# Patient Record
Sex: Female | Born: 1945
Health system: Southern US, Community
[De-identification: ages and names within clinical notes are randomized; demographics above are authoritative.]

## PROBLEM LIST (undated history)

## (undated) DIAGNOSIS — F419 Anxiety disorder, unspecified: Secondary | ICD-10-CM

## (undated) DIAGNOSIS — J45909 Unspecified asthma, uncomplicated: Secondary | ICD-10-CM

## (undated) DIAGNOSIS — M1990 Unspecified osteoarthritis, unspecified site: Secondary | ICD-10-CM

## (undated) DIAGNOSIS — J209 Acute bronchitis, unspecified: Secondary | ICD-10-CM

## (undated) HISTORY — DX: Unspecified osteoarthritis, unspecified site: M19.90

## (undated) HISTORY — PX: FACELIFT: SHX1566

## (undated) HISTORY — DX: Anxiety disorder, unspecified: F41.9

## (undated) HISTORY — DX: Acute bronchitis, unspecified: J20.9

## (undated) HISTORY — DX: Unspecified asthma, uncomplicated: J45.909

## (undated) HISTORY — PX: BREAST ENHANCEMENT SURGERY: SHX7

## (undated) HISTORY — PX: ROTATOR CUFF REPAIR: SHX139

## (undated) HISTORY — PX: CATARACT EXTRACTION W/ INTRAOCULAR LENS IMPLANT: SHX1309

---

## 2001-08-15 HISTORY — PX: TUMOR EXCISION: SHX421

## 2001-12-12 ENCOUNTER — Other Ambulatory Visit: Admission: RE | Admit: 2001-12-12 | Discharge: 2001-12-12 | Payer: Self-pay | Admitting: Family Medicine

## 2002-08-15 HISTORY — PX: TIBIA FRACTURE SURGERY: SHX806

## 2003-06-20 ENCOUNTER — Inpatient Hospital Stay (HOSPITAL_COMMUNITY): Admission: RE | Admit: 2003-06-20 | Discharge: 2003-06-24 | Payer: Self-pay | Admitting: Specialist

## 2007-08-29 ENCOUNTER — Ambulatory Visit: Payer: Self-pay | Admitting: Internal Medicine

## 2007-11-27 ENCOUNTER — Other Ambulatory Visit: Admission: RE | Admit: 2007-11-27 | Discharge: 2007-11-27 | Payer: Self-pay | Admitting: Internal Medicine

## 2008-06-26 ENCOUNTER — Ambulatory Visit: Payer: Self-pay | Admitting: Internal Medicine

## 2009-01-08 ENCOUNTER — Other Ambulatory Visit: Admission: RE | Admit: 2009-01-08 | Discharge: 2009-01-08 | Payer: Self-pay | Admitting: Internal Medicine

## 2009-01-08 ENCOUNTER — Ambulatory Visit: Payer: Self-pay | Admitting: Internal Medicine

## 2009-04-27 ENCOUNTER — Ambulatory Visit: Payer: Self-pay | Admitting: Internal Medicine

## 2010-03-09 ENCOUNTER — Ambulatory Visit: Payer: Self-pay | Admitting: Internal Medicine

## 2010-08-19 ENCOUNTER — Ambulatory Visit
Admission: RE | Admit: 2010-08-19 | Discharge: 2010-08-19 | Payer: Self-pay | Source: Home / Self Care | Attending: Internal Medicine | Admitting: Internal Medicine

## 2010-12-28 NOTE — Assessment & Plan Note (Signed)
[facility]                         GASTROENTEROLOGY OFFICE NOTE   Mary Garcia, Mary Garcia                           MRN:          [id number]  DATE:08/29/2007                            DOB:          February 09, 1946    HISTORY:  Mary Garcia is a 65 year old white female whom we saw for a  screening colonoscopy in 2005, with findings of a mild diverticulosis of  the left colon.  Starting in August 2008, she started having abdominal  pain, which came in waves.  She had several severe attacks of epigastric  pain localized, extending to the left upper quadrant.  It happened at a  time when she started a weight loss program through [employer] and  has lost about 15 pounds.  She was at that time on a low-fat diet.  She  discontinued her Boniva for six months at that time, but she is back on  it.  She denied taking any anti-inflammatory agents or excessive  aspirin.  Since August she has off and on had problems with her stomach,  but essentially she is about 50% to 75% improved.  An upper abdominal  ultrasound done in November 2008, was normal.  A HIDA scan with CCK  showed a low ejection fraction, according to the patient 20%, but I do  not have the exact results available to me.  She also reports that she  had a reproduction of her abdominal pain with CK injection.  She  subsequently saw two separate surgeons for a second opinion.  Each of  them hesitated to proceed with a cholecystectomy because of lack of  objective evidence.  She has improved, except for last week she had  another attack while being in [place], after having an [place] meal which  was rather spicy.  The pain and diarrhea continued for several hours,  but she was better the next day.  There was no fever or jaundice.  She  was tried on Zegerid without any improvement of her symptoms.   MEDICATIONS:  1. Boniva once a month.  It was restarted in December.  2. Calcium supplements and vitamin D.  3.  Ambien p.r.n.   FAMILY HISTORY:  Negative for gallbladder disease.  Father had brain  cancer, deceased.  Mother had diabetes.   SOCIAL HISTORY:  Married.  She has a college degree.  She works as a  Armed forces operational officer.  She is married and has a 62 year old son.  She drinks  about four glasses of wine per week.  She used to smoke for 30 years,  but no longer.   PAST MEDICAL HISTORY:  1. Osteopenia.  2. She had breast augmentation in the 1990's.   REVIEW OF SYSTEMS:  Positive for eye glasses, sleeping problems.   PHYSICAL EXAMINATION:  VITAL SIGNS:  Blood pressure 104/70, pulse 64,  weight 125 pounds.  GENERAL:  She was alert and oriented, in no distress.  HEENT:  Sclerae anicteric.  NECK:  Supple with no lymphadenopathy.  LUNGS:  Clear to auscultation.  COR:  Normal S1 and S2.  ABDOMEN:  Soft,  nontender, relaxed.  Normoactive bowel sounds.  I could  not reproduce her pain or tenderness today.  Liver edge at the costal  margin.  There were no scars.  Left upper quadrant today was normal.  Left lower quadrant was also normal.  RECTAL:  Soft, Hemoccult-negative stool.   IMPRESSION:  A 65 year old white female with episode of left upper and  epigastric abdominal pain, which was reproduced on a HIDA scan with CCK  injection, although on ultrasound her gallbladder appeared normal,  without evidence of chronic cholecystitis.  Her symptoms are quite  suggestive of biliary colic, which seems to be recurring, especially  after certain meals with a high-fat content.  The other explanation  could be irritable bowel syndrome with splenic flexure syndrome or  gastrocolic reflux leading to diarrhea.  She was on Bentyl for awhile  and it seemed to help some of her symptoms.  She also tried Zegerid  without much improvement.  Another possibility would be peptic ulcer  disease, although she has no dyspepsia or heartburn and the PPI's did  not help.   PLAN:  1. At this point she is getting  better, so we are going to continue to      observe her.  I have encouraged her to stay on a strict low-fat      diet.  2. New prescription for dicyclomine, to take 20 mg on a p.r.n. basis,      especially before going out to eat.  3. Call us if the pain recurs.  I would like to get liver function      tests, amylase and lipase within 24 hours of the pain.  4. I would like to see her in the office to decide if we need to      repeat the HIDA scan.  5. Vicodin 10 mg, one p.o. q.4-6h. p.r.n. severe abdominal pain.  She      may use this if she gets an attack.     Hedwig Morton. Juanda Chance, MD  Electronically Signed    DMB/MedQ  DD: 08/29/2007  DT: 08/29/2007  Job #: [id number]   cc:   Duwayne Heck L. Mahaffey, M.D.

## 2010-12-31 NOTE — Discharge Summary (Signed)
NAME:  ADDISSON, FRATE                              ACCOUNT NO.:  [id number]   MEDICAL RECORD NO.:  [id number]                   PATIENT TYPE:  INP   LOCATION:  5024                                 FACILITY:  [facility]   PHYSICIAN:  Erasmo Leventhal, M.D.         DATE OF BIRTH:  Dec 18, 1945   DATE OF ADMISSION:  06/20/2003  DATE OF DISCHARGE:  06/24/2003                                 DISCHARGE SUMMARY   ADMISSION DIAGNOSIS:  Left tibia-fibula fracture.   DISCHARGE DIAGNOSIS:  Left tibia-fibula fracture.   OPERATION:  Intramedullary nail tibia with percutaneous posterior malleolar  screw and open reduction and internal fixation fibular fracture.   BRIEF HISTORY:  This is a 65 year old lady who had a fall while hiking on  the Saturday before admission, was seen at [facility], casted.  The fracture was a closed type fracture.  She was advised to contact her  orthopedist when she returned to town and contacted [place].  We saw her,  evaluated her, and felt that she needed IM nail and open reduction and  internal fixation of her tibial fracture and posterior malleolar fracture,  and she was subsequently admitted for same.   LABORATORY DATA:  Admission CBC within normal limits.  Admission BMET showed  glucose high at 100, otherwise  within normal limits.  She did have elevated  liver functions with AST 62, ALT 76.  Repeat CBC showed hemoglobin and  hematocrit dropped to a low of 10.9 and 31.0 at discharge.  She did run  elevated sugars throughout her admission with a high of 146.  Calcium was  low at 8.0 and sodium at 132 on November 7.  Her liver functions repeated on  November 7 showed the AST to drop to 50 and ALT to drop to 68.  Total  bilirubin was normal at 0.5 initially and then low at 0.2.   HOSPITAL COURSE:  The patient tolerated the operative procedure well.  Postoperatively, she had some burning pain in the side of her leg, otherwise  dressing remained dry.  She could  wiggle her toes.  Neurovascular status  remained intact.  Compartments were soft.   On the second postoperative day, she was feeling pretty good.  Vital signs  were stable.  She was afebrile.  No calf tenderness, no compartment signs.  PCA was to be discontinued in the a.m., and repeat LFTs were obtained and  showed them to be decreasing.  On the third postoperative day, she had a  temperature to 100.2.  Her knee dressing was changed.  Her wound was benign.  She had no calf tenderness.  Lungs were clear but slightly decreased breath  sounds in the right base, and incentive spirometry was switched to every 30  minutes while awake.  PCA was discontinued, and the patient was switched to  Percocet 1 p.o. q.4-6h. p.r.n. pain.   On the fourth postoperative day,  the patient continued to do well, did well  in PT, getting up, and on postoperative day #5, the patient was subsequently  discharged home for followup in the office.   CONDITION ON DISCHARGE:  Improved.   DISCHARGE MEDICATIONS:  Lovenox, Percocet, Robaxin, and Trinsicon.   DISCHARGE INSTRUCTIONS:  1. She is to be nonweightbearing on her leg.  2. She is to call her medical doctor for recheck of her liver function     studies in two weeks.  3. She is to do her exercises as instructed.  4. Return to the office in 10 days for reevaluation or sooner p.r.n.     problems.      [name], P.A.                   Erasmo Leventhal, M.D.    SJC/MEDQ  D:  08/07/2003  T:  08/08/2003  Job:  [id number]

## 2010-12-31 NOTE — Op Note (Signed)
NAME:  Mary, Garcia                              ACCOUNT NO.:  [id number]   MEDICAL RECORD NO.:  [id number]                   PATIENT TYPE:  INP   LOCATION:  2550                                 FACILITY:  [facility]   PHYSICIAN:  Erasmo Leventhal, M.D.         DATE OF BIRTH:  06/24/1946   DATE OF PROCEDURE:  06/20/2003  DATE OF DISCHARGE:                                 OPERATIVE REPORT   PREOPERATIVE DIAGNOSES:  Left tibial diaphyseal fracture, left ankle  bimalleolar fracture, posterior malleolus and comminuted displaced distal  fibula fracture, lateral malleolus.   POSTOPERATIVE DIAGNOSES:  Left tibial diaphyseal fracture, left ankle  bimalleolar fracture, posterior malleolus and comminuted displaced distal  fibula fracture, lateral malleolus.   PROCEDURE:  1. Closed reduction and percutaneous internal fixation of posterior     malleolus.  2. Closed reduction with intramedullary nailing of tibial diaphyseal     fracture, static interlock.  3. Open reduction and internal fixation of the distal fibula lateral     malleolus fracture.   SURGEON:  Erasmo Leventhal, M.D.   ASSESSMENT:  Mary Garcia, P.A.   ANESTHESIA:  General.   ESTIMATED BLOOD LOSS:  Less than 150 mL.   DRAINS:  None.   COMPLICATIONS:  None.   TOURNIQUET TIME:  1 hour and 20 minutes at 350 mmHg.   DESCRIPTION OF PROCEDURE:  The patient was counseled in the holding area,  correct side was identified. IV was started, antibiotics had been given.  Taken to the operating room, placed in the supine position, general  anesthesia, left lower extremity long leg cast was removed. The skin was  intact, swelling was mild to moderate, compartments were soft, good pulses  distally. She is elevated, prepped with Duraprep and all draped in sterile  fashion. Exsanguination of the Esmarch, tourniquet was inflated to 350 mmHg.   Attention was directed initially to the posterior malleolus fracture which  was  nondisplaced, but I was concerned with placement of the intramedullary  rod it would displace it. Utilizing the C-arm, a small percutaneous incision  was made anteromedially directly over the anterior tibial tendon. It was  retracted laterally down to the periosteum of the bone. Utilizing the Depuy  cannulated screw set, a 4.0 cannulated screw was placed from anterior to  posterior capturing the fragment just above the joint line and totally  stabilized the posterior malleolus. It remained anatomically reduced to the  joint, mildly displaced metaphyseal diaphyseal junction.   Attention directed proximally and a longitudinal incision made anterior to  the patella tendon through the skin and subcutaneous tissue, small veins  were lightly coagulated. A medial incision was made through the retinaculum  and capsule. At this point in time, we stay intraarticular but  extrasynovial. She has a very short amount of tibia between the tibial  articular surface and then the tibial tubercle and patellar tendon  insertion.  A guide pin was placed directly in the center, it was over-  reamed with a step reamer. Utilizing a ball tip guidewire, it was gently  passed from proximal to distal across the fracture site to reduce it  manually. She had a very tight canal. I then utilizing sequential reaming. I  then reamed. The tourniquet was deflated prior to reaming for cooling of the  bone. Beginning at 8, I went to 8, 8-1/2, and 9 mm. At 9-mm ream, we had  excellent cortical chatter and that was as much as we could do. I initially  chose a 28.5-cm screw, however, that was too short, I went to a 30 cm next.  The ball tip guidewire was exchanged for a straight tip and then with my  being very meticulous proximally making sure we did not injure the proximal  tibia. An 8 mm x 30 cm Ace tibial nail was placed from proximal to distal  across the fracture site aligning it as well as possibly regaining length  and  rotation. There was some offset of displacement, I felt it was  satisfactory. It was taken to the appropriate level and then proximally  utilizing standard technique, two percutaneous screws were placed to  __________to the jig. The jig was then removed and locking cap was placed  into the tibial nail. Distally I chose to interlock the more proximal  transverse interlocking screw due to the fact that the distal screw was near  the posterior malleolar fracture. I percutaneously made a small incision  medially and utilizing the C-arm and fluoroscopic technique, the transverse  screw was then placed and exchanged for a shorter one. At this point, we had  excellent fixation of the tibia, alignment and rotation length and also the  posterior malleolus.   The tibia and distal fibula was comminuted and I was concerned if I did not  fix it at this point in time, the ankle may drift into valgus. Based upon  that, the extremity was exsanguinated again with an Esmarch, longitudinal  incision was made and tourniquet was inflated. A longitudinal incision was  made laterally through the skin and subcutaneous tissue, small cutaneous  nerves retracted out of the way. The periosteum was opened, the distal  fibula was found to be severely comminuted utilizing the Ace titanium screw  set plate. A one-third tubular plate was applied posteriorly, fixed distally  and then proximally reducing the fracture as well as possible. An  interfragment screw was placed from anterior to posterior with a washer.   At this point in time, I felt like I had excellent reduction and internal  fixation of the tibial diaphyseal fracture, the posterior malleolus and the  distal fibula fracture.   All wounds were irrigated during the closure. The percutaneous incisions  were closed with staples proximally. The medial parapatellar incision was closed with Vicryl, subcu Vicryl, skin subcuticular Monocryl sutures. Ankle  was  closed, periosteum with Vicryl, subcu Vicryl. The skin was closed with  subcuticular Monocryl suture. Benzoin and Steri-Strips were applied. The  tourniquet was deflated. She had normal circulation of the foot and ankle at  the end of the case. Excellent clinical alignment and appearance of the  lower extremity. She was placed in a well padded plaster splint at this  point in time. She was given another gram of Ancef intravenously.   The patient was then awakened, taken from the operating room to PACU in  stable condition. Sponge and needle count were  correct. There were no  complications. Throughout the entire procedure, careful thought and  consideration was given to the neurovascular structure of the lower  extremity and these were protected. In addition, the compartments were  palpated and felt to be soft at the end of the case and there was no  evidence of an impending compartment syndrome. The patient was awakened and  taken from the operating room to the recovery room in stable condition.  Sponge and needle count were correct. The surgeon was Erasmo Leventhal,  M.D., assistant Mary Garcia, P.A.   To decrease surgical time and help throughout the entire difficult  procedure, Mr. Brett Canales Garcia's assistance was needed.                                               Erasmo Leventhal, M.D.   RAC/MEDQ  D:  06/20/2003  T:  06/21/2003  Job:  [id number]

## 2011-03-08 ENCOUNTER — Encounter: Payer: Self-pay | Admitting: Internal Medicine

## 2011-03-14 ENCOUNTER — Other Ambulatory Visit: Payer: Self-pay | Admitting: Internal Medicine

## 2011-03-14 DIAGNOSIS — Z0000 Encounter for general adult medical examination without abnormal findings: Secondary | ICD-10-CM

## 2011-03-14 LAB — CBC WITH DIFFERENTIAL/PLATELET
Basophils Absolute: 0 10*3/uL (ref 0.0–0.1)
Basophils Relative: 0 % (ref 0–1)
Eosinophils Absolute: 0.1 10*3/uL (ref 0.0–0.7)
Eosinophils Relative: 2 % (ref 0–5)
HCT: 41.9 % (ref 36.0–46.0)
Hemoglobin: 13.8 g/dL (ref 12.0–15.0)
Lymphocytes Relative: 28 % (ref 12–46)
Lymphs Abs: 1.4 10*3/uL (ref 0.7–4.0)
MCH: 28.8 pg (ref 26.0–34.0)
MCHC: 32.9 g/dL (ref 30.0–36.0)
MCV: 87.3 fL (ref 78.0–100.0)
Monocytes Absolute: 0.7 10*3/uL (ref 0.1–1.0)
Monocytes Relative: 15 % — ABNORMAL HIGH (ref 3–12)
Neutro Abs: 2.6 10*3/uL (ref 1.7–7.7)
Neutrophils Relative %: 54 % (ref 43–77)
Platelets: 295 10*3/uL (ref 150–400)
RBC: 4.8 MIL/uL (ref 3.87–5.11)
RDW: 14.9 % (ref 11.5–15.5)
WBC: 4.8 10*3/uL (ref 4.0–10.5)

## 2011-03-14 LAB — COMPREHENSIVE METABOLIC PANEL
ALT: 16 U/L (ref 0–35)
AST: 21 U/L (ref 0–37)
Albumin: 4.2 g/dL (ref 3.5–5.2)
Alkaline Phosphatase: 64 U/L (ref 39–117)
BUN: 19 mg/dL (ref 6–23)
CO2: 22 mEq/L (ref 19–32)
Calcium: 9.6 mg/dL (ref 8.4–10.5)
Chloride: 104 mEq/L (ref 96–112)
Creat: 0.73 mg/dL (ref 0.50–1.10)
Glucose, Bld: 91 mg/dL (ref 70–99)
Potassium: 4.5 mEq/L (ref 3.5–5.3)
Sodium: 139 mEq/L (ref 135–145)
Total Bilirubin: 0.2 mg/dL — ABNORMAL LOW (ref 0.3–1.2)
Total Protein: 6.8 g/dL (ref 6.0–8.3)

## 2011-03-14 LAB — LIPID PANEL
Cholesterol: 221 mg/dL — ABNORMAL HIGH (ref 0–200)
HDL: 95 mg/dL (ref >39)
LDL Cholesterol: 108 mg/dL — ABNORMAL HIGH (ref 0–99)
Total CHOL/HDL Ratio: 2.3 Ratio
Triglycerides: 91 mg/dL (ref <150)
VLDL: 18 mg/dL (ref 0–40)

## 2011-03-14 LAB — TSH: TSH: 2.995 u[IU]/mL (ref 0.350–4.500)

## 2011-03-15 LAB — VITAMIN D 25 HYDROXY (VIT D DEFICIENCY, FRACTURES): Vit D, 25-Hydroxy: 40 ng/mL (ref 30–89)

## 2011-03-18 ENCOUNTER — Ambulatory Visit (INDEPENDENT_AMBULATORY_CARE_PROVIDER_SITE_OTHER): Payer: Self-pay | Admitting: Internal Medicine

## 2011-03-18 ENCOUNTER — Encounter: Payer: Self-pay | Admitting: Internal Medicine

## 2011-03-18 VITALS — BP 116/76 | HR 76 | Temp 98.2°F | Ht 62.75 in | Wt 132.0 lb

## 2011-03-18 DIAGNOSIS — G4700 Insomnia, unspecified: Secondary | ICD-10-CM

## 2011-03-18 DIAGNOSIS — M1990 Unspecified osteoarthritis, unspecified site: Secondary | ICD-10-CM

## 2011-03-18 DIAGNOSIS — Z8669 Personal history of other diseases of the nervous system and sense organs: Secondary | ICD-10-CM

## 2011-03-18 DIAGNOSIS — F411 Generalized anxiety disorder: Secondary | ICD-10-CM

## 2011-03-18 DIAGNOSIS — K828 Other specified diseases of gallbladder: Secondary | ICD-10-CM

## 2011-03-18 DIAGNOSIS — F419 Anxiety disorder, unspecified: Secondary | ICD-10-CM

## 2011-03-18 DIAGNOSIS — M8580 Other specified disorders of bone density and structure, unspecified site: Secondary | ICD-10-CM

## 2011-03-18 DIAGNOSIS — E559 Vitamin D deficiency, unspecified: Secondary | ICD-10-CM

## 2011-03-18 DIAGNOSIS — M899 Disorder of bone, unspecified: Secondary | ICD-10-CM

## 2011-03-18 DIAGNOSIS — Z0000 Encounter for general adult medical examination without abnormal findings: Secondary | ICD-10-CM

## 2011-03-18 DIAGNOSIS — K589 Irritable bowel syndrome without diarrhea: Secondary | ICD-10-CM

## 2011-03-18 LAB — POCT URINALYSIS DIPSTICK
Bilirubin, UA: NEGATIVE
Blood, UA: NEGATIVE
Glucose, UA: NEGATIVE
Leukocytes, UA: NEGATIVE
Nitrite, UA: NEGATIVE
Protein, UA: NEGATIVE
Spec Grav, UA: 1.025
Urobilinogen, UA: NEGATIVE
pH, UA: 6

## 2011-04-13 ENCOUNTER — Encounter: Payer: Self-pay | Admitting: Internal Medicine

## 2011-04-13 DIAGNOSIS — K589 Irritable bowel syndrome without diarrhea: Secondary | ICD-10-CM | POA: Insufficient documentation

## 2011-04-13 DIAGNOSIS — K828 Other specified diseases of gallbladder: Secondary | ICD-10-CM | POA: Insufficient documentation

## 2011-04-13 DIAGNOSIS — E559 Vitamin D deficiency, unspecified: Secondary | ICD-10-CM | POA: Insufficient documentation

## 2011-04-13 DIAGNOSIS — F419 Anxiety disorder, unspecified: Secondary | ICD-10-CM | POA: Insufficient documentation

## 2011-04-13 DIAGNOSIS — G4700 Insomnia, unspecified: Secondary | ICD-10-CM | POA: Insufficient documentation

## 2011-04-13 DIAGNOSIS — M810 Age-related osteoporosis without current pathological fracture: Secondary | ICD-10-CM | POA: Insufficient documentation

## 2011-04-13 DIAGNOSIS — Z8669 Personal history of other diseases of the nervous system and sense organs: Secondary | ICD-10-CM | POA: Insufficient documentation

## 2011-04-13 DIAGNOSIS — M1990 Unspecified osteoarthritis, unspecified site: Secondary | ICD-10-CM | POA: Insufficient documentation
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
## 2011-05-17 ENCOUNTER — Telehealth: Payer: Self-pay | Admitting: Internal Medicine

## 2011-05-17 NOTE — Telephone Encounter (Signed)
Needs office visit.

## 2011-05-17 NOTE — Telephone Encounter (Signed)
Called pt to schedule appt.  Scheduled for Thursday, 05/19/11.  Pt will cancel if she is feeling better.

## 2011-05-19 ENCOUNTER — Ambulatory Visit: Payer: Self-pay | Admitting: Internal Medicine

## 2011-07-01 ENCOUNTER — Encounter: Payer: Self-pay | Admitting: Internal Medicine

## 2011-07-01 ENCOUNTER — Ambulatory Visit (INDEPENDENT_AMBULATORY_CARE_PROVIDER_SITE_OTHER): Payer: BC Managed Care – PPO | Admitting: Internal Medicine

## 2011-07-01 VITALS — BP 120/86 | HR 88 | Temp 98.0°F | Wt 131.5 lb

## 2011-07-01 DIAGNOSIS — H6590 Unspecified nonsuppurative otitis media, unspecified ear: Secondary | ICD-10-CM

## 2011-07-01 DIAGNOSIS — J329 Chronic sinusitis, unspecified: Secondary | ICD-10-CM

## 2011-07-01 DIAGNOSIS — H6593 Unspecified nonsuppurative otitis media, bilateral: Secondary | ICD-10-CM

## 2011-07-01 NOTE — Patient Instructions (Signed)
Take Levaquin daily for 10 days with a meal. call if not better in 10 days

## 2011-07-01 NOTE — Progress Notes (Signed)
  Subjective:    Patient ID: Mary Garcia, female    DOB: 1945-10-21, 65 y.o.   MRN: [id number]  HPI five-day history of URI symptoms with discolored nasal drainage. Has discolored sputum production in the early mornings. Now has right maxillary sinus pain. No fever or chills. Her son is getting married in 8 days. Sounds nasally congested.    Review of Systems     Objective:   Physical Exam HEENT exam: TMs are full bilaterally; pharynx is clear without exudate; neck is supple without significant adenopathy; chest clear        Assessment & Plan:  Sinusitis  Serous otitis media  Plan: Levaquin 500 milligrams daily for 10 days. Take with food. Diflucan 150 mg tablet with no refill to take if Candida vaginitis occurs while on antibiotics

## 2011-10-20 ENCOUNTER — Other Ambulatory Visit: Payer: Self-pay

## 2011-10-20 DIAGNOSIS — G4700 Insomnia, unspecified: Secondary | ICD-10-CM

## 2011-10-20 MED ORDER — ZOLPIDEM TARTRATE 10 MG PO TABS: 10.0000 mg | ORAL_TABLET | Freq: Every evening | ORAL | Status: DC | PRN

## 2011-12-12 ENCOUNTER — Other Ambulatory Visit: Payer: Self-pay

## 2011-12-12 MED ORDER — ALPRAZOLAM 0.25 MG PO TABS: 0.2500 mg | ORAL_TABLET | Freq: Two times a day (BID) | ORAL | Status: AC | PRN

## 2012-03-29 ENCOUNTER — Other Ambulatory Visit: Payer: BC Managed Care – PPO | Admitting: Internal Medicine

## 2012-04-03 ENCOUNTER — Ambulatory Visit (INDEPENDENT_AMBULATORY_CARE_PROVIDER_SITE_OTHER): Payer: BC Managed Care – PPO | Admitting: Internal Medicine

## 2012-04-03 ENCOUNTER — Other Ambulatory Visit (HOSPITAL_COMMUNITY)
Admission: RE | Admit: 2012-04-03 | Discharge: 2012-04-03 | Disposition: A | Discharge status: Home / Self Care | Payer: BC Managed Care – PPO | Source: Ambulatory Visit | Attending: Internal Medicine | Admitting: Internal Medicine

## 2012-04-03 ENCOUNTER — Encounter: Payer: Self-pay | Admitting: Internal Medicine

## 2012-04-03 VITALS — BP 122/84 | HR 80 | Temp 97.7°F | Ht 63.0 in | Wt 137.5 lb

## 2012-04-03 DIAGNOSIS — Z0000 Encounter for general adult medical examination without abnormal findings: Secondary | ICD-10-CM

## 2012-04-03 DIAGNOSIS — G4700 Insomnia, unspecified: Secondary | ICD-10-CM

## 2012-04-03 DIAGNOSIS — Z01419 Encounter for gynecological examination (general) (routine) without abnormal findings: Secondary | ICD-10-CM | POA: Insufficient documentation

## 2012-04-03 DIAGNOSIS — Z124 Encounter for screening for malignant neoplasm of cervix: Secondary | ICD-10-CM

## 2012-04-03 LAB — COMPREHENSIVE METABOLIC PANEL
ALT: 15 U/L (ref 0–35)
AST: 18 U/L (ref 0–37)
Albumin: 4.4 g/dL (ref 3.5–5.2)
Alkaline Phosphatase: 65 U/L (ref 39–117)
BUN: 19 mg/dL (ref 6–23)
CO2: 26 mEq/L (ref 19–32)
Calcium: 9.3 mg/dL (ref 8.4–10.5)
Chloride: 106 mEq/L (ref 96–112)
Creat: 0.69 mg/dL (ref 0.50–1.10)
Glucose, Bld: 78 mg/dL (ref 70–99)
Potassium: 4.2 mEq/L (ref 3.5–5.3)
Sodium: 141 mEq/L (ref 135–145)
Total Bilirubin: 0.5 mg/dL (ref 0.3–1.2)
Total Protein: 6.6 g/dL (ref 6.0–8.3)

## 2012-04-03 LAB — CBC WITH DIFFERENTIAL/PLATELET
Basophils Absolute: 0 10*3/uL (ref 0.0–0.1)
Basophils Relative: 0 % (ref 0–1)
Eosinophils Absolute: 0.1 10*3/uL (ref 0.0–0.7)
Eosinophils Relative: 2 % (ref 0–5)
HCT: 40.7 % (ref 36.0–46.0)
Hemoglobin: 14.3 g/dL (ref 12.0–15.0)
Lymphocytes Relative: 28 % (ref 12–46)
Lymphs Abs: 1.4 10*3/uL (ref 0.7–4.0)
MCH: 29.5 pg (ref 26.0–34.0)
MCHC: 35.1 g/dL (ref 30.0–36.0)
MCV: 84.1 fL (ref 78.0–100.0)
Monocytes Absolute: 0.4 10*3/uL (ref 0.1–1.0)
Monocytes Relative: 9 % (ref 3–12)
Neutro Abs: 3.1 10*3/uL (ref 1.7–7.7)
Neutrophils Relative %: 61 % (ref 43–77)
Platelets: 302 10*3/uL (ref 150–400)
RBC: 4.84 MIL/uL (ref 3.87–5.11)
RDW: 14.8 % (ref 11.5–15.5)
WBC: 5 10*3/uL (ref 4.0–10.5)

## 2012-04-03 LAB — LIPID PANEL
Cholesterol: 247 mg/dL — ABNORMAL HIGH (ref 0–200)
HDL: 102 mg/dL (ref >39)
LDL Cholesterol: 128 mg/dL — ABNORMAL HIGH (ref 0–99)
Total CHOL/HDL Ratio: 2.4 Ratio
Triglycerides: 85 mg/dL (ref <150)
VLDL: 17 mg/dL (ref 0–40)

## 2012-04-03 LAB — POCT URINALYSIS DIPSTICK
Bilirubin, UA: NEGATIVE
Blood, UA: NEGATIVE
Glucose, UA: NEGATIVE
Ketones, UA: NEGATIVE
Leukocytes, UA: NEGATIVE
Nitrite, UA: NEGATIVE
Protein, UA: NEGATIVE
Spec Grav, UA: 1.02
Urobilinogen, UA: NEGATIVE
pH, UA: 5.5

## 2012-04-03 LAB — TSH: TSH: 2.209 u[IU]/mL (ref 0.350–4.500)

## 2012-04-04 LAB — VITAMIN D 25 HYDROXY (VIT D DEFICIENCY, FRACTURES): Vit D, 25-Hydroxy: 34 ng/mL (ref 30–89)
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
## 2012-07-13 NOTE — Patient Instructions (Addendum)
Continue same medications and return in one year. 

## 2012-09-24 ENCOUNTER — Other Ambulatory Visit: Payer: Self-pay

## 2012-09-24 MED ORDER — ALPRAZOLAM 0.25 MG PO TABS: 0.2500 mg | ORAL_TABLET | Freq: Two times a day (BID) | ORAL | Status: DC | PRN

## 2012-11-02 ENCOUNTER — Other Ambulatory Visit: Payer: Self-pay

## 2012-11-02 DIAGNOSIS — G4700 Insomnia, unspecified: Secondary | ICD-10-CM

## 2012-11-02 MED ORDER — ZOLPIDEM TARTRATE 10 MG PO TABS: 10.0000 mg | ORAL_TABLET | Freq: Every evening | ORAL | Status: DC | PRN

## 2013-04-16 ENCOUNTER — Other Ambulatory Visit: Payer: BC Managed Care – PPO | Admitting: Internal Medicine

## 2013-04-16 DIAGNOSIS — Z0000 Encounter for general adult medical examination without abnormal findings: Secondary | ICD-10-CM

## 2013-04-16 DIAGNOSIS — M8580 Other specified disorders of bone density and structure, unspecified site: Secondary | ICD-10-CM

## 2013-04-16 DIAGNOSIS — Z1329 Encounter for screening for other suspected endocrine disorder: Secondary | ICD-10-CM

## 2013-04-16 DIAGNOSIS — Z130 Encounter for screening for diseases of the blood and blood-forming organs and certain disorders involving the immune mechanism: Secondary | ICD-10-CM

## 2013-04-16 DIAGNOSIS — Z13220 Encounter for screening for lipoid disorders: Secondary | ICD-10-CM

## 2013-04-16 LAB — LIPID PANEL
Cholesterol: 209 mg/dL — ABNORMAL HIGH (ref 0–200)
HDL: 90 mg/dL (ref >39)
LDL Cholesterol: 103 mg/dL — ABNORMAL HIGH (ref 0–99)
Total CHOL/HDL Ratio: 2.3 Ratio
Triglycerides: 81 mg/dL (ref <150)
VLDL: 16 mg/dL (ref 0–40)

## 2013-04-16 LAB — COMPREHENSIVE METABOLIC PANEL
ALT: 12 U/L (ref 0–35)
AST: 17 U/L (ref 0–37)
Albumin: 4.1 g/dL (ref 3.5–5.2)
Alkaline Phosphatase: 60 U/L (ref 39–117)
BUN: 18 mg/dL (ref 6–23)
CO2: 25 mEq/L (ref 19–32)
Calcium: 9.3 mg/dL (ref 8.4–10.5)
Chloride: 108 mEq/L (ref 96–112)
Creat: 0.77 mg/dL (ref 0.50–1.10)
Glucose, Bld: 89 mg/dL (ref 70–99)
Potassium: 4.3 mEq/L (ref 3.5–5.3)
Sodium: 141 mEq/L (ref 135–145)
Total Bilirubin: 0.4 mg/dL (ref 0.3–1.2)
Total Protein: 6.3 g/dL (ref 6.0–8.3)

## 2013-04-16 LAB — CBC WITH DIFFERENTIAL/PLATELET
Basophils Absolute: 0 K/uL (ref 0.0–0.1)
Basophils Relative: 0 % (ref 0–1)
Eosinophils Absolute: 0.1 K/uL (ref 0.0–0.7)
Eosinophils Relative: 1 % (ref 0–5)
HCT: 41.2 % (ref 36.0–46.0)
Hemoglobin: 13.8 g/dL (ref 12.0–15.0)
Lymphocytes Relative: 24 % (ref 12–46)
Lymphs Abs: 1.1 K/uL (ref 0.7–4.0)
MCH: 28.4 pg (ref 26.0–34.0)
MCHC: 33.5 g/dL (ref 30.0–36.0)
MCV: 84.8 fL (ref 78.0–100.0)
Monocytes Absolute: 0.3 K/uL (ref 0.1–1.0)
Monocytes Relative: 6 % (ref 3–12)
Neutro Abs: 3.2 K/uL (ref 1.7–7.7)
Neutrophils Relative %: 69 % (ref 43–77)
Platelets: 308 K/uL (ref 150–400)
RBC: 4.86 MIL/uL (ref 3.87–5.11)
RDW: 15.3 % (ref 11.5–15.5)
WBC: 4.7 K/uL (ref 4.0–10.5)

## 2013-04-16 LAB — TSH: TSH: 2.165 u[IU]/mL (ref 0.350–4.500)

## 2013-04-17 LAB — VITAMIN D 25 HYDROXY (VIT D DEFICIENCY, FRACTURES): Vit D, 25-Hydroxy: 39 ng/mL (ref 30–89)

## 2013-04-18 ENCOUNTER — Encounter: Payer: BC Managed Care – PPO | Admitting: Internal Medicine

## 2013-04-23 ENCOUNTER — Encounter: Payer: Self-pay | Admitting: Internal Medicine

## 2013-04-23 ENCOUNTER — Ambulatory Visit (INDEPENDENT_AMBULATORY_CARE_PROVIDER_SITE_OTHER): Payer: BC Managed Care – PPO | Admitting: Internal Medicine

## 2013-04-23 VITALS — BP 130/90 | HR 72 | Ht 63.0 in | Wt 142.0 lb

## 2013-04-23 DIAGNOSIS — Z23 Encounter for immunization: Secondary | ICD-10-CM

## 2013-04-23 DIAGNOSIS — K828 Other specified diseases of gallbladder: Secondary | ICD-10-CM

## 2013-04-23 DIAGNOSIS — F411 Generalized anxiety disorder: Secondary | ICD-10-CM

## 2013-04-23 DIAGNOSIS — G4700 Insomnia, unspecified: Secondary | ICD-10-CM

## 2013-04-23 DIAGNOSIS — M1610 Unilateral primary osteoarthritis, unspecified hip: Secondary | ICD-10-CM

## 2013-04-23 DIAGNOSIS — M899 Disorder of bone, unspecified: Secondary | ICD-10-CM

## 2013-04-23 DIAGNOSIS — M8580 Other specified disorders of bone density and structure, unspecified site: Secondary | ICD-10-CM

## 2013-04-23 DIAGNOSIS — Z0000 Encounter for general adult medical examination without abnormal findings: Secondary | ICD-10-CM

## 2013-04-23 DIAGNOSIS — K589 Irritable bowel syndrome without diarrhea: Secondary | ICD-10-CM

## 2013-04-23 DIAGNOSIS — Z8669 Personal history of other diseases of the nervous system and sense organs: Secondary | ICD-10-CM

## 2013-04-23 DIAGNOSIS — M1612 Unilateral primary osteoarthritis, left hip: Secondary | ICD-10-CM

## 2013-04-23 DIAGNOSIS — M169 Osteoarthritis of hip, unspecified: Secondary | ICD-10-CM

## 2013-04-23 LAB — POCT URINALYSIS DIPSTICK
Bilirubin, UA: NEGATIVE
Blood, UA: NEGATIVE
Glucose, UA: NEGATIVE
Ketones, UA: NEGATIVE
Leukocytes, UA: NEGATIVE
Nitrite, UA: NEGATIVE
Protein, UA: NEGATIVE
Spec Grav, UA: 1.01
Urobilinogen, UA: NEGATIVE
pH, UA: 5.5

## 2013-04-23 MED ORDER — ZOLPIDEM TARTRATE 10 MG PO TABS: 10.0000 mg | ORAL_TABLET | Freq: Every evening | ORAL | Status: DC | PRN

## 2013-04-23 MED ORDER — DICYCLOMINE HCL 20 MG PO TABS: 20.0000 mg | ORAL_TABLET | Freq: Four times a day (QID) | ORAL | Status: DC

## 2013-04-23 MED ORDER — ALPRAZOLAM 0.25 MG PO TABS: 0.2500 mg | ORAL_TABLET | Freq: Two times a day (BID) | ORAL | Status: DC | PRN

## 2013-04-23 MED ORDER — TETANUS-DIPHTH-ACELL PERTUSSIS 5-2.5-18.5 LF-MCG/0.5 IM SUSP: 0.5000 mL | Freq: Once | INTRAMUSCULAR | Status: DC

## 2013-04-23 NOTE — Patient Instructions (Addendum)
Take Bentyl prn for irritable bowel. Given flu vaccine and tetanus vaccine today. Return in one year

## 2013-04-24 ENCOUNTER — Encounter: Payer: Self-pay | Admitting: Internal Medicine
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
## 2013-08-16 ENCOUNTER — Ambulatory Visit
Admission: RE | Admit: 2013-08-16 | Discharge: 2013-08-16 | Disposition: A | Discharge status: Home / Self Care | Payer: Medicare Other | Source: Ambulatory Visit | Attending: Internal Medicine | Admitting: Internal Medicine

## 2013-08-16 ENCOUNTER — Encounter: Payer: Self-pay | Admitting: Internal Medicine

## 2013-08-16 ENCOUNTER — Ambulatory Visit (INDEPENDENT_AMBULATORY_CARE_PROVIDER_SITE_OTHER): Payer: Medicare Other | Admitting: Internal Medicine

## 2013-08-16 VITALS — BP 122/90 | HR 92 | Temp 98.8°F | Wt 132.0 lb

## 2013-08-16 DIAGNOSIS — R05 Cough: Secondary | ICD-10-CM

## 2013-08-16 DIAGNOSIS — J209 Acute bronchitis, unspecified: Secondary | ICD-10-CM

## 2013-08-16 DIAGNOSIS — R059 Cough, unspecified: Secondary | ICD-10-CM

## 2013-08-16 LAB — CBC WITH DIFFERENTIAL/PLATELET
HCT: 40.8 % (ref 36.0–46.0)
Hemoglobin: 13.7 g/dL (ref 12.0–15.0)
Lymphocytes Relative: 30 % (ref 12–46)
Lymphs Abs: 1.4 10*3/uL (ref 0.7–4.0)
MCH: 29.1 pg (ref 26.0–34.0)
MCHC: 33.7 g/dL (ref 30.0–36.0)
MCV: 86.5 fL (ref 78.0–100.0)
Monocytes Absolute: 0.1 10*3/uL (ref 0.1–1.0)
Monocytes Relative: 1 % — ABNORMAL LOW (ref 3–12)
Neutro Abs: 3.2 10*3/uL (ref 1.7–7.7)
Neutrophils Relative %: 69 % (ref 43–77)
Platelets: 300 10*3/uL (ref 150–400)
RBC: 4.72 MIL/uL (ref 3.87–5.11)
RDW: 13.1 % (ref 11.5–15.5)
WBC: 4.7 10*3/uL (ref 4.0–10.5)

## 2013-08-16 MED ORDER — LEVOFLOXACIN 500 MG PO TABS
500.0000 mg | ORAL_TABLET | Freq: Every day | ORAL | Status: DC
Start: 2013-08-16 — End: 2013-10-04

## 2013-08-16 MED ORDER — FLUTICASONE-SALMETEROL 250-50 MCG/DOSE IN AEPB: 1.0000 | INHALATION_SPRAY | Freq: Two times a day (BID) | RESPIRATORY_TRACT | Status: DC

## 2013-08-16 MED ORDER — METHYLPREDNISOLONE (PAK) 4 MG PO TABS
ORAL_TABLET | ORAL | Status: DC
Start: 2013-08-16 — End: 2013-10-04

## 2013-08-16 MED ORDER — HYDROCODONE-HOMATROPINE 5-1.5 MG/5ML PO SYRP: 5.0000 mL | ORAL_SOLUTION | Freq: Three times a day (TID) | ORAL | Status: DC | PRN

## 2013-08-16 MED ORDER — CEFTRIAXONE SODIUM 1 G IJ SOLR
1.0000 g | Freq: Once | INTRAMUSCULAR | Status: AC
  Administered 2013-08-16: 1 g via INTRAMUSCULAR

## 2013-08-16 NOTE — Patient Instructions (Addendum)
Take Levaquin and Medrol as directed. Use albuterol and Advair inhalers as directed. Take cough syrup as needed.

## 2013-08-16 NOTE — Progress Notes (Signed)
   Subjective:    Patient ID: Mary Garcia, female    DOB: [birth year], 68 y.o.   MRN: [id number]  HPI   Was in [place], [place] on December 29. Was seen there by physician and was told she had bronchitis and possibly pneumonia. No chest x-ray was done. Was placed on Zithromax Z-Pak. Was given an albuterol treatment in the office and an albuterol inhaler. Says pulse oximetry was 95% in the office. Says she was having some shortness of breath. Had coughing congestion. No fever. Had chills. Cough has been discolored at times. She is leaving on trip to the [place] Monday, January 5. Patient is a former smoker. Smoked at most a pack of cigarettes daily on weekends for 10 years. Quit about 30 years ago.    Review of Systems     Objective:   Physical Exam has congested cough. Has coarse breath sounds left lateral chest. Some wheezing with cough. Pulse oximetry is within normal limits.        Assessment & Plan:  CBC with differential stat. Chest x-ray stat.  Chest x-ray shows no evidence of pneumonia but evidence of COPD. CBC is within normal limits  Diagnosis: Bronchitis  Plan: Medrol 4 mg 6 day dosepak. 1 g IM Rocephin given in office. Change from Zithromax Z-PAK to Levaquin 500 milligrams daily for 10 days. Advair inhaler 250/50 one spray by mouth every 12 hours. Albuterol inhaler 2 sprays by mouth 4 times a day. Hycodan 8 ounces 1 teaspoon by mouth Q8 hours when necessary cough.

## 2013-10-04 ENCOUNTER — Ambulatory Visit (INDEPENDENT_AMBULATORY_CARE_PROVIDER_SITE_OTHER): Payer: Medicare Other | Admitting: Internal Medicine

## 2013-10-04 ENCOUNTER — Encounter: Payer: Self-pay | Admitting: Internal Medicine

## 2013-10-04 VITALS — BP 132/86 | Temp 99.3°F | Wt 132.0 lb

## 2013-10-04 DIAGNOSIS — J209 Acute bronchitis, unspecified: Secondary | ICD-10-CM

## 2013-10-04 DIAGNOSIS — J029 Acute pharyngitis, unspecified: Secondary | ICD-10-CM

## 2013-10-04 LAB — POCT RAPID STREP A (OFFICE): Rapid Strep A Screen: NEGATIVE

## 2013-10-04 MED ORDER — METHYLPREDNISOLONE 4 MG PO TABS: ORAL_TABLET | ORAL | Status: DC

## 2013-10-04 MED ORDER — CEFTRIAXONE SODIUM 1 G IJ SOLR
1.0000 g | Freq: Once | INTRAMUSCULAR | Status: AC
  Administered 2013-10-04: 1 g via INTRAMUSCULAR

## 2013-10-04 MED ORDER — ALBUTEROL SULFATE HFA 108 (90 BASE) MCG/ACT IN AERS: 2.0000 | INHALATION_SPRAY | Freq: Four times a day (QID) | RESPIRATORY_TRACT | Status: DC | PRN

## 2013-10-04 MED ORDER — HYDROCODONE-HOMATROPINE 5-1.5 MG/5ML PO SYRP: 5.0000 mL | ORAL_SOLUTION | Freq: Three times a day (TID) | ORAL | Status: DC | PRN

## 2013-10-04 MED ORDER — LEVOFLOXACIN 500 MG PO TABS: 500.0000 mg | ORAL_TABLET | Freq: Every day | ORAL | Status: DC

## 2013-10-04 NOTE — Patient Instructions (Addendum)
Take meds as directed. Use inhalers x 2 weeks.

## 2013-10-04 NOTE — Progress Notes (Signed)
   Subjective:    Patient ID: Mary Garcia, female    DOB: 1946/03/27, 68 y.o.   MRN: [id number]  HPI Patient was here January 2 with protracted respiratory infection. At that time she was treated with Levaquin, albuterol inhaler, Advair inhaler, Medrol Dosepak, Hycodan and given 1 g IM Rocephin. She got better. Now has 3 day history of URI symptoms. Beginning to have discolored sputum production. Has cough. Has some sore throat and low-grade fever. Has malaise and fatigue.    Review of Systems     Objective:   Physical Exam Skin warm and dry. Nodes none. HEENT exam: Pharynx is red. Rapid strep screen negative. TMs are slightly full bilaterally but not red. Neck is supple without adenopathy. Chest clear to auscultation without rales or wheezing       Assessment & Plan:  Acute URI  History of COPD  Plan: Restart Advair and albuterol inhalers as previously prescribed. Rocephin 1 g IM. Levaquin 500 milligrams daily for 10 days. Medrol Dosepak in tapering course for 6 days

## 2013-10-26 ENCOUNTER — Encounter: Payer: Self-pay | Admitting: Internal Medicine

## 2013-11-10 ENCOUNTER — Other Ambulatory Visit: Payer: Self-pay | Admitting: Internal Medicine

## 2013-11-11 NOTE — Telephone Encounter (Signed)
Refill x 6 months 

## 2013-11-14 ENCOUNTER — Encounter: Payer: Self-pay | Admitting: Internal Medicine

## 2013-11-22 ENCOUNTER — Other Ambulatory Visit: Payer: Self-pay | Admitting: Internal Medicine

## 2013-11-24 NOTE — Telephone Encounter (Signed)
Refill once 

## 2013-12-18 ENCOUNTER — Ambulatory Visit (AMBULATORY_SURGERY_CENTER): Payer: Self-pay | Admitting: *Deleted

## 2013-12-18 VITALS — Ht 63.0 in | Wt 134.8 lb

## 2013-12-18 DIAGNOSIS — Z1211 Encounter for screening for malignant neoplasm of colon: Secondary | ICD-10-CM

## 2013-12-18 MED ORDER — MOVIPREP 100 G PO SOLR: ORAL | Status: DC

## 2013-12-18 NOTE — Progress Notes (Signed)
Patient denies any allergies to eggs or soy. Patient denies any problems with anesthesia/sedation. Patient denies any oxygen use at home and does not take any diet/weight loss medications. EMMI education assisgned to patient on colonoscopy. 

## 2013-12-23 ENCOUNTER — Encounter: Payer: Self-pay | Admitting: Internal Medicine

## 2014-01-01 ENCOUNTER — Encounter: Payer: Self-pay | Admitting: Internal Medicine

## 2014-01-01 ENCOUNTER — Ambulatory Visit (AMBULATORY_SURGERY_CENTER): Payer: Medicare Other | Admitting: Internal Medicine

## 2014-01-01 VITALS — BP 126/97 | HR 64 | Temp 97.0°F | Resp 18 | Ht 63.0 in | Wt 132.0 lb

## 2014-01-01 DIAGNOSIS — Z1211 Encounter for screening for malignant neoplasm of colon: Secondary | ICD-10-CM

## 2014-01-01 MED ORDER — SODIUM CHLORIDE 0.9 % IV SOLN: 500.0000 mL | INTRAVENOUS | Status: DC

## 2014-01-01 NOTE — Op Note (Signed)
[facility]  [address] [place], [address]   COLONOSCOPY PROCEDURE REPORT  PATIENT: Mary, Garcia  MR#: [id number] BIRTHDATE: Jun 18, 1946 , 65  yrs. old GENDER: Female ENDOSCOPIST: Lafayette Dragon, MD REFERRED BM:WUXL Parke Simmers, M.D. PROCEDURE DATE:  01/01/2014 PROCEDURE:   Colonoscopy, screening First Screening Colonoscopy - Avg.  risk and is 50 yrs.  old or older - No.  Prior Negative Screening - Now for repeat screening. 10 or more years since last screening  History of Adenoma - Now for follow-up colonoscopy & has been > or = to 3 yrs.  N/A  Polyps Removed Today? No.  Recommend repeat exam, <10 yrs? No. ASA CLASS:   Class II INDICATIONS:Average risk patient for colon cancer. MEDICATIONS: MAC sedation, administered by CRNA and propofol (Diprivan) 400mg  IV  DESCRIPTION OF PROCEDURE:   After the risks benefits and alternatives of the procedure were thoroughly explained, informed consent was obtained.  A digital rectal exam revealed no abnormalities of the rectum.   The LB [id number]  endoscope was introduced through the anus and advanced to the cecum, which was identified by both the appendix and ileocecal valve. No adverse events experienced.   The quality of the prep was good, using MoviPrep  The instrument was then slowly withdrawn as the colon was fully examined.      COLON FINDINGS: A normal appearing cecum, ileocecal valve, and appendiceal orifice were identified.  The ascending, hepatic flexure, transverse, splenic flexure, descending, sigmoid colon and rectum appeared unremarkable.  No polyps or cancers were seen. Retroflexed views revealed no abnormalities. The time to cecum=11 minutes 15 seconds.  Withdrawal time=7 minutes 26 seconds.  The scope was withdrawn and the procedure completed. COMPLICATIONS: There were no complications.  ENDOSCOPIC IMPRESSION: Normal colon mild diverticulosis of the left colon  RECOMMENDATIONS: high fiber  diet Recall colonoscopy in 10 years   eSigned:  Lafayette Dragon, MD 01/01/2014 4:19 PM   cc:   PATIENT NAME:  Mary, Garcia MR#: [id number]

## 2014-01-01 NOTE — Patient Instructions (Signed)
Discharge instructions given with verbal understanding. Handouts on diverticulosis and a high fiber diet. Resume previous medications. YOU HAD AN ENDOSCOPIC PROCEDURE TODAY AT THE [facility]: Refer to the procedure report that was given to you for any specific questions about what was found during the examination.  If the procedure report does not answer your questions, please call your gastroenterologist to clarify.  If you requested that your care partner not be given the details of your procedure findings, then the procedure report has been included in a sealed envelope for you to review at your convenience later.  YOU SHOULD EXPECT: Some feelings of bloating in the abdomen. Passage of more gas than usual.  Walking can help get rid of the air that was put into your GI tract during the procedure and reduce the bloating. If you had a lower endoscopy (such as a colonoscopy or flexible sigmoidoscopy) you may notice spotting of blood in your stool or on the toilet paper. If you underwent a bowel prep for your procedure, then you may not have a normal bowel movement for a few days.  DIET: Your first meal following the procedure should be a light meal and then it is ok to progress to your normal diet.  A half-sandwich or bowl of soup is an example of a good first meal.  Heavy or fried foods are harder to digest and may make you feel nauseous or bloated.  Likewise meals heavy in dairy and vegetables can cause extra gas to form and this can also increase the bloating.  Drink plenty of fluids but you should avoid alcoholic beverages for 24 hours.  ACTIVITY: Your care partner should take you home directly after the procedure.  You should plan to take it easy, moving slowly for the rest of the day.  You can resume normal activity the day after the procedure however you should NOT DRIVE or use heavy machinery for 24 hours (because of the sedation medicines used during the test).    SYMPTOMS TO REPORT  IMMEDIATELY: A gastroenterologist can be reached at any hour.  During normal business hours, 8:30 AM to 5:00 PM Monday through Friday, call [phone].  After hours and on weekends, please call the GI answering service at [phone] who will take a message and have the physician on call contact you.   Following lower endoscopy (colonoscopy or flexible sigmoidoscopy):  Excessive amounts of blood in the stool  Significant tenderness or worsening of abdominal pains  Swelling of the abdomen that is new, acute  Fever of 100F or higher FOLLOW UP: If any biopsies were taken you will be contacted by phone or by letter within the next 1-3 weeks.  Call your gastroenterologist if you have not heard about the biopsies in 3 weeks.  Our staff will call the home number listed on your records the next business day following your procedure to check on you and address any questions or concerns that you may have at that time regarding the information given to you following your procedure. This is a courtesy call and so if there is no answer at the home number and we have not heard from you through the emergency physician on call, we will assume that you have returned to your regular daily activities without incident.  SIGNATURES/CONFIDENTIALITY: You and/or your care partner have signed paperwork which will be entered into your electronic medical record.  These signatures attest to the fact that that the information above on your After   Visit Summary has been reviewed and is understood.  Full responsibility of the confidentiality of this discharge information lies with you and/or your care-partner. 

## 2014-01-02 ENCOUNTER — Telehealth: Payer: Self-pay | Admitting: *Deleted

## 2014-01-02 NOTE — Telephone Encounter (Signed)
No answer, left message to call if questions or concerns. 

## 2014-05-13 ENCOUNTER — Other Ambulatory Visit: Payer: Self-pay | Admitting: Internal Medicine

## 2014-05-18 ENCOUNTER — Other Ambulatory Visit: Payer: Self-pay | Admitting: Internal Medicine

## 2014-05-27 ENCOUNTER — Other Ambulatory Visit: Payer: Self-pay | Admitting: Internal Medicine

## 2014-05-27 NOTE — Telephone Encounter (Signed)
Refill through December. Pt has appt then.

## 2014-06-06 ENCOUNTER — Encounter: Payer: Self-pay | Admitting: Internal Medicine

## 2014-06-20 ENCOUNTER — Encounter: Payer: Self-pay | Admitting: Internal Medicine

## 2014-06-20 ENCOUNTER — Ambulatory Visit (INDEPENDENT_AMBULATORY_CARE_PROVIDER_SITE_OTHER): Payer: Medicare Other | Admitting: Internal Medicine

## 2014-06-20 VITALS — BP 122/90 | HR 69 | Temp 98.0°F | Ht 63.0 in | Wt 141.0 lb

## 2014-06-20 DIAGNOSIS — J069 Acute upper respiratory infection, unspecified: Secondary | ICD-10-CM

## 2014-06-20 MED ORDER — METHYLPREDNISOLONE 4 MG PO TABS: ORAL_TABLET | ORAL | Status: DC

## 2014-06-20 MED ORDER — FLUTICASONE-SALMETEROL 250-50 MCG/DOSE IN AEPB: 1.0000 | INHALATION_SPRAY | Freq: Two times a day (BID) | RESPIRATORY_TRACT | Status: DC

## 2014-06-20 MED ORDER — LEVOFLOXACIN 500 MG PO TABS: 500.0000 mg | ORAL_TABLET | Freq: Every day | ORAL | Status: DC

## 2014-06-20 MED ORDER — ALBUTEROL SULFATE HFA 108 (90 BASE) MCG/ACT IN AERS: 2.0000 | INHALATION_SPRAY | Freq: Four times a day (QID) | RESPIRATORY_TRACT | Status: DC | PRN

## 2014-06-20 NOTE — Patient Instructions (Signed)
Take medrol directed. Levaquin daily with a meal for 10 days. Inhalers as directed.

## 2014-07-03 ENCOUNTER — Other Ambulatory Visit (INDEPENDENT_AMBULATORY_CARE_PROVIDER_SITE_OTHER): Payer: Medicare Other | Admitting: Internal Medicine

## 2014-07-03 DIAGNOSIS — Z130 Encounter for screening for diseases of the blood and blood-forming organs and certain disorders involving the immune mechanism: Secondary | ICD-10-CM

## 2014-07-03 DIAGNOSIS — Z23 Encounter for immunization: Secondary | ICD-10-CM

## 2014-07-03 DIAGNOSIS — E559 Vitamin D deficiency, unspecified: Secondary | ICD-10-CM

## 2014-07-03 DIAGNOSIS — Z0000 Encounter for general adult medical examination without abnormal findings: Secondary | ICD-10-CM

## 2014-07-03 DIAGNOSIS — Z1329 Encounter for screening for other suspected endocrine disorder: Secondary | ICD-10-CM

## 2014-07-03 DIAGNOSIS — Z13220 Encounter for screening for lipoid disorders: Secondary | ICD-10-CM

## 2014-07-03 LAB — CBC WITH DIFFERENTIAL/PLATELET
Basophils Absolute: 0 10*3/uL (ref 0.0–0.1)
Basophils Relative: 0 % (ref 0–1)
EOS PCT: 1 % (ref 0–5)
Eosinophils Absolute: 0 10*3/uL (ref 0.0–0.7)
HEMATOCRIT: 42 % (ref 36.0–46.0)
Hemoglobin: 14 g/dL (ref 12.0–15.0)
LYMPHS PCT: 32 % (ref 12–46)
Lymphs Abs: 1.2 10*3/uL (ref 0.7–4.0)
MCH: 28.7 pg (ref 26.0–34.0)
MCHC: 33.3 g/dL (ref 30.0–36.0)
MCV: 86.1 fL (ref 78.0–100.0)
MONO ABS: 0.3 10*3/uL (ref 0.1–1.0)
MONOS PCT: 8 % (ref 3–12)
MPV: 9.8 fL (ref 9.4–12.4)
Neutro Abs: 2.2 10*3/uL (ref 1.7–7.7)
Neutrophils Relative %: 59 % (ref 43–77)
Platelets: 270 10*3/uL (ref 150–400)
RBC: 4.88 MIL/uL (ref 3.87–5.11)
RDW: 15.2 % (ref 11.5–15.5)
WBC: 3.7 10*3/uL — ABNORMAL LOW (ref 4.0–10.5)

## 2014-07-03 LAB — COMPREHENSIVE METABOLIC PANEL
ALBUMIN: 4 g/dL (ref 3.5–5.2)
ALT: 15 U/L (ref 0–35)
AST: 18 U/L (ref 0–37)
Alkaline Phosphatase: 56 U/L (ref 39–117)
BUN: 20 mg/dL (ref 6–23)
CALCIUM: 9.2 mg/dL (ref 8.4–10.5)
CO2: 25 meq/L (ref 19–32)
Chloride: 105 mEq/L (ref 96–112)
Creat: 0.67 mg/dL (ref 0.50–1.10)
Glucose, Bld: 81 mg/dL (ref 70–99)
POTASSIUM: 4.8 meq/L (ref 3.5–5.3)
Sodium: 140 mEq/L (ref 135–145)
Total Bilirubin: 0.4 mg/dL (ref 0.2–1.2)
Total Protein: 6.1 g/dL (ref 6.0–8.3)

## 2014-07-03 LAB — LIPID PANEL
CHOLESTEROL: 212 mg/dL — AB (ref 0–200)
HDL: 95 mg/dL (ref >39)
LDL Cholesterol: 102 mg/dL — ABNORMAL HIGH (ref 0–99)
Total CHOL/HDL Ratio: 2.2 Ratio
Triglycerides: 77 mg/dL (ref <150)
VLDL: 15 mg/dL (ref 0–40)

## 2014-07-03 LAB — TSH: TSH: 2.046 u[IU]/mL (ref 0.350–4.500)

## 2014-07-04 LAB — VITAMIN D 25 HYDROXY (VIT D DEFICIENCY, FRACTURES): Vit D, 25-Hydroxy: 23 ng/mL — ABNORMAL LOW (ref 30–100)

## 2014-07-14 ENCOUNTER — Other Ambulatory Visit: Payer: Medicare Other | Admitting: Internal Medicine

## 2014-07-15 ENCOUNTER — Encounter: Payer: Self-pay | Admitting: Internal Medicine

## 2014-07-15 ENCOUNTER — Ambulatory Visit (INDEPENDENT_AMBULATORY_CARE_PROVIDER_SITE_OTHER): Payer: Medicare Other | Admitting: Internal Medicine

## 2014-07-15 VITALS — BP 122/80 | HR 72 | Temp 97.0°F | Ht 63.0 in | Wt 141.5 lb

## 2014-07-15 DIAGNOSIS — Z0000 Encounter for general adult medical examination without abnormal findings: Secondary | ICD-10-CM

## 2014-07-15 DIAGNOSIS — Z8709 Personal history of other diseases of the respiratory system: Secondary | ICD-10-CM | POA: Insufficient documentation

## 2014-07-15 DIAGNOSIS — G4700 Insomnia, unspecified: Secondary | ICD-10-CM

## 2014-07-15 LAB — POCT URINALYSIS DIPSTICK
Bilirubin, UA: NEGATIVE
Glucose, UA: NEGATIVE
Ketones, UA: NEGATIVE
Leukocytes, UA: NEGATIVE
Nitrite, UA: NEGATIVE
Protein, UA: NEGATIVE
RBC UA: NEGATIVE
Spec Grav, UA: 1.015
UROBILINOGEN UA: NEGATIVE
pH, UA: 5

## 2014-07-15 MED ORDER — ALPRAZOLAM 0.25 MG PO TABS: 0.2500 mg | ORAL_TABLET | Freq: Two times a day (BID) | ORAL | Status: DC | PRN

## 2014-07-15 NOTE — Patient Instructions (Signed)
Continue same meds and return in one year.

## 2014-07-22 ENCOUNTER — Encounter (HOSPITAL_COMMUNITY): Payer: Self-pay

## 2014-07-22 ENCOUNTER — Emergency Department (HOSPITAL_COMMUNITY)
Admission: EM | Admit: 2014-07-22 | Discharge: 2014-07-23 | Disposition: A | Discharge status: Home / Self Care | Payer: Medicare Other | Attending: Emergency Medicine | Admitting: Emergency Medicine

## 2014-07-22 ENCOUNTER — Emergency Department (HOSPITAL_COMMUNITY): Payer: Medicare Other

## 2014-07-22 DIAGNOSIS — R1032 Left lower quadrant pain: Secondary | ICD-10-CM | POA: Insufficient documentation

## 2014-07-22 DIAGNOSIS — R1030 Lower abdominal pain, unspecified: Secondary | ICD-10-CM

## 2014-07-22 DIAGNOSIS — F419 Anxiety disorder, unspecified: Secondary | ICD-10-CM | POA: Diagnosis not present

## 2014-07-22 DIAGNOSIS — R112 Nausea with vomiting, unspecified: Secondary | ICD-10-CM

## 2014-07-22 DIAGNOSIS — Z87891 Personal history of nicotine dependence: Secondary | ICD-10-CM | POA: Diagnosis not present

## 2014-07-22 DIAGNOSIS — M1990 Unspecified osteoarthritis, unspecified site: Secondary | ICD-10-CM | POA: Insufficient documentation

## 2014-07-22 DIAGNOSIS — R197 Diarrhea, unspecified: Secondary | ICD-10-CM | POA: Diagnosis present

## 2014-07-22 DIAGNOSIS — Z8709 Personal history of other diseases of the respiratory system: Secondary | ICD-10-CM | POA: Diagnosis not present

## 2014-07-22 DIAGNOSIS — A09 Infectious gastroenteritis and colitis, unspecified: Secondary | ICD-10-CM | POA: Diagnosis not present

## 2014-07-22 DIAGNOSIS — Z79899 Other long term (current) drug therapy: Secondary | ICD-10-CM | POA: Insufficient documentation

## 2014-07-22 DIAGNOSIS — Z7951 Long term (current) use of inhaled steroids: Secondary | ICD-10-CM | POA: Diagnosis not present

## 2014-07-22 DIAGNOSIS — Z7982 Long term (current) use of aspirin: Secondary | ICD-10-CM | POA: Diagnosis not present

## 2014-07-22 MED ORDER — SODIUM CHLORIDE 0.9 % IV BOLUS (SEPSIS)
500.0000 mL | Freq: Once | INTRAVENOUS | Status: AC
Start: 2014-07-22 — End: 2014-07-23
  Administered 2014-07-23: 500 mL via INTRAVENOUS

## 2014-07-22 NOTE — ED Notes (Signed)
Patient reports sudden onset of lower abdominal cramping followed by diarrhea and then vomiting.  She has vomited six times since onset.  EMS medicated her with Zofran 4mg  IV.  Currently complains of feeling weak.  Denies pain.

## 2014-07-22 NOTE — ED Notes (Signed)
Bed: RP39 Expected date:  Expected time:  Means of arrival:  Comments: EMS 68 yo female N/V/D zofran 4 mg IV

## 2014-07-23 ENCOUNTER — Encounter (HOSPITAL_COMMUNITY): Payer: Self-pay

## 2014-07-23 ENCOUNTER — Emergency Department (HOSPITAL_COMMUNITY): Payer: Medicare Other

## 2014-07-23 LAB — CBC WITH DIFFERENTIAL/PLATELET
BASOS ABS: 0 10*3/uL (ref 0.0–0.1)
Basophils Relative: 0 % (ref 0–1)
EOS ABS: 0 10*3/uL (ref 0.0–0.7)
EOS PCT: 0 % (ref 0–5)
HCT: 44.2 % (ref 36.0–46.0)
Hemoglobin: 14.9 g/dL (ref 12.0–15.0)
Lymphocytes Relative: 4 % — ABNORMAL LOW (ref 12–46)
Lymphs Abs: 0.5 10*3/uL — ABNORMAL LOW (ref 0.7–4.0)
MCH: 29.2 pg (ref 26.0–34.0)
MCHC: 33.7 g/dL (ref 30.0–36.0)
MCV: 86.7 fL (ref 78.0–100.0)
MONO ABS: 1 10*3/uL (ref 0.1–1.0)
Monocytes Relative: 9 % (ref 3–12)
Neutro Abs: 10 10*3/uL — ABNORMAL HIGH (ref 1.7–7.7)
Neutrophils Relative %: 87 % — ABNORMAL HIGH (ref 43–77)
PLATELETS: 265 10*3/uL (ref 150–400)
RBC: 5.1 MIL/uL (ref 3.87–5.11)
RDW: 14.2 % (ref 11.5–15.5)
WBC: 11.5 10*3/uL — AB (ref 4.0–10.5)

## 2014-07-23 LAB — COMPREHENSIVE METABOLIC PANEL
ALT: 19 U/L (ref 0–35)
AST: 23 U/L (ref 0–37)
Albumin: 4.5 g/dL (ref 3.5–5.2)
Alkaline Phosphatase: 79 U/L (ref 39–117)
Anion gap: 17 — ABNORMAL HIGH (ref 5–15)
BUN: 25 mg/dL — ABNORMAL HIGH (ref 6–23)
CALCIUM: 10.4 mg/dL (ref 8.4–10.5)
CO2: 23 mEq/L (ref 19–32)
CREATININE: 0.82 mg/dL (ref 0.50–1.10)
Chloride: 97 mEq/L (ref 96–112)
GFR calc Af Amer: 85 mL/min — ABNORMAL LOW (ref >90)
GFR, EST NON AFRICAN AMERICAN: 73 mL/min — AB (ref >90)
Glucose, Bld: 101 mg/dL — ABNORMAL HIGH (ref 70–99)
Potassium: 3.8 mEq/L (ref 3.7–5.3)
SODIUM: 137 meq/L (ref 137–147)
TOTAL PROTEIN: 7.8 g/dL (ref 6.0–8.3)
Total Bilirubin: 0.3 mg/dL (ref 0.3–1.2)

## 2014-07-23 LAB — LIPASE, BLOOD: LIPASE: 43 U/L (ref 11–59)

## 2014-07-23 MED ORDER — MORPHINE SULFATE 4 MG/ML IJ SOLN
4.0000 mg | Freq: Once | INTRAMUSCULAR | Status: AC
  Administered 2014-07-23: 4 mg via INTRAVENOUS
  Filled 2014-07-23: qty 1

## 2014-07-23 MED ORDER — CIPROFLOXACIN HCL 500 MG PO TABS: 500.0000 mg | ORAL_TABLET | Freq: Two times a day (BID) | ORAL | Status: DC

## 2014-07-23 MED ORDER — METRONIDAZOLE 500 MG PO TABS
500.0000 mg | ORAL_TABLET | Freq: Once | ORAL | Status: AC
  Administered 2014-07-23: 500 mg via ORAL
  Filled 2014-07-23: qty 1

## 2014-07-23 MED ORDER — ONDANSETRON HCL 4 MG PO TABS: 4.0000 mg | ORAL_TABLET | Freq: Four times a day (QID) | ORAL | Status: DC

## 2014-07-23 MED ORDER — HYDROCODONE-ACETAMINOPHEN 5-325 MG PO TABS: 1.0000 | ORAL_TABLET | Freq: Four times a day (QID) | ORAL | Status: DC | PRN

## 2014-07-23 MED ORDER — CIPROFLOXACIN HCL 500 MG PO TABS
500.0000 mg | ORAL_TABLET | Freq: Once | ORAL | Status: AC
  Administered 2014-07-23: 500 mg via ORAL
  Filled 2014-07-23: qty 1

## 2014-07-23 MED ORDER — METRONIDAZOLE 500 MG PO TABS: 500.0000 mg | ORAL_TABLET | Freq: Two times a day (BID) | ORAL | Status: DC

## 2014-07-23 MED ORDER — PROMETHAZINE HCL 25 MG/ML IJ SOLN
12.5000 mg | Freq: Once | INTRAMUSCULAR | Status: AC | PRN
  Administered 2014-07-23: 12.5 mg via INTRAVENOUS
  Filled 2014-07-23: qty 1

## 2014-07-23 MED ORDER — ONDANSETRON HCL 4 MG/2ML IJ SOLN
4.0000 mg | Freq: Once | INTRAMUSCULAR | Status: AC
  Administered 2014-07-23: 4 mg via INTRAVENOUS
  Filled 2014-07-23: qty 2

## 2014-07-23 MED ORDER — METOCLOPRAMIDE HCL 5 MG/ML IJ SOLN
10.0000 mg | Freq: Once | INTRAMUSCULAR | Status: AC
  Administered 2014-07-23: 10 mg via INTRAVENOUS
  Filled 2014-07-23: qty 2

## 2014-07-23 MED ORDER — IOHEXOL 300 MG/ML  SOLN
25.0000 mL | Freq: Once | INTRAMUSCULAR | Status: AC | PRN
  Administered 2014-07-23: 25 mL via ORAL

## 2014-07-23 MED ORDER — DIPHENHYDRAMINE HCL 50 MG/ML IJ SOLN
25.0000 mg | Freq: Once | INTRAMUSCULAR | Status: AC
  Administered 2014-07-23: 25 mg via INTRAVENOUS
  Filled 2014-07-23: qty 1

## 2014-07-23 MED ORDER — IOHEXOL 300 MG/ML  SOLN
100.0000 mL | Freq: Once | INTRAMUSCULAR | Status: AC | PRN
  Administered 2014-07-23: 100 mL via INTRAVENOUS

## 2014-07-23 MED ORDER — MORPHINE SULFATE 4 MG/ML IJ SOLN
2.0000 mg | Freq: Once | INTRAMUSCULAR | Status: DC
  Filled 2014-07-23: qty 1

## 2014-07-23 MED ORDER — PROMETHAZINE HCL 25 MG RE SUPP: 25.0000 mg | Freq: Four times a day (QID) | RECTAL | Status: DC | PRN

## 2014-07-23 NOTE — ED Notes (Signed)
Patient tolerated PO's well.  Currently resting.

## 2014-07-23 NOTE — ED Notes (Signed)
Patient transported to CT 

## 2014-07-23 NOTE — ED Provider Notes (Signed)
CSN: [id number]     Arrival date & time 07/22/14  2255 History   First MD Initiated Contact with Patient 07/22/14 2320     Chief Complaint  Patient presents with  . Emesis  . Diarrhea     (Consider location/radiation/quality/duration/timing/severity/associated sxs/prior Treatment) Patient is a 68 y.o. female presenting with vomiting and diarrhea. The history is provided by the patient and medical records. No language interpreter was used.  Emesis Associated symptoms: abdominal pain and diarrhea   Associated symptoms: no headaches   Diarrhea Associated symptoms: abdominal pain and vomiting   Associated symptoms: no diaphoresis, no fever and no headaches      Mary Garcia is a 68 y.o. female  with a hx of anxiety, arthritis presents to the Emergency Department complaining of gradual, persistent, progressively worsening lower abdominal pain onset 2 hours prior to arrival. Patient reports after eating dinner she had a sudden onset of lower abdominal cramping followed immediately by several bouts of diarrhea and then persistent vomiting. Patient reports that pain is cramping in nature and was initially a 10 out of 10 but is now less than that. It does not radiate. Patient reports emesis was stomach contents and was nonbloody and nonbilious. Diarrhea was watery without melena or hematochezia. Patient denies history of abdominal surgery, diverticulitis, appendicitis.  She reports no fevers or chills, neck pain or chest pain, shortness of breath, syncope or dysuria. Nothing seems to make symptoms better or worse.  Past Medical History  Diagnosis Date  . Bronchitis, acute   . Anxiety   . Arthritis    Past Surgical History  Procedure Laterality Date  . Tumor excision  2003    right muscle back, benign  . Tibia fracture surgery  2004    left leg x3, tibia and fibula  . Facelift      local  . Breast enhancement surgery     Family History  Problem Relation Age of Onset  . Brain cancer Father    . Colon cancer Neg Hx    History  Substance Use Topics  . Smoking status: Former Smoker    Types: Cigarettes    Quit date: 03/17/1973  . Smokeless tobacco: Never Used  . Alcohol Use: 1.2 oz/week    2 Glasses of wine per week   OB History    No data available     Review of Systems  Constitutional: Negative for fever, diaphoresis, appetite change, fatigue and unexpected weight change.  HENT: Negative for mouth sores and trouble swallowing.   Eyes: Negative for visual disturbance.  Respiratory: Negative for cough, chest tightness, shortness of breath, wheezing and stridor.   Cardiovascular: Negative for chest pain and palpitations.  Gastrointestinal: Positive for nausea, vomiting, abdominal pain and diarrhea. Negative for constipation, blood in stool, abdominal distention and rectal pain.  Endocrine: Negative for polydipsia, polyphagia and polyuria.  Genitourinary: Negative for dysuria, urgency, frequency, hematuria, flank pain and difficulty urinating.  Musculoskeletal: Negative for back pain, neck pain and neck stiffness.  Skin: Negative for rash.  Allergic/Immunologic: Negative for immunocompromised state.  Neurological: Negative for syncope, weakness, light-headedness and headaches.  Hematological: Negative for adenopathy. Does not bruise/bleed easily.  Psychiatric/Behavioral: Negative for confusion and sleep disturbance. The patient is not nervous/anxious.   All other systems reviewed and are negative.     Allergies  Review of patient's allergies indicates no known allergies.  Home Medications   Prior to Admission medications   Medication Sig Start Date End Date Taking? Authorizing Provider  albuterol (PROVENTIL HFA;VENTOLIN HFA) 108 (90 BASE) MCG/ACT inhaler Inhale 2 puffs into the lungs every 6 (six) hours as needed for wheezing or shortness of breath. 06/20/14  Yes Elby Showers, MD  aspirin 81 MG tablet Take 81 mg by mouth daily.   Yes Historical Provider, MD   diphenhydramine-acetaminophen (TYLENOL PM) 25-500 MG TABS Take 1 tablet by mouth at bedtime as needed (pain).   Yes Historical Provider, MD  Fluticasone-Salmeterol (ADVAIR DISKUS) 250-50 MCG/DOSE AEPB Inhale 1 puff into the lungs 2 (two) times daily. 06/20/14  Yes Elby Showers, MD  Vitamin D, Cholecalciferol, 1000 UNITS TABS Take 1 tablet by mouth daily.   Yes Historical Provider, MD  zolpidem (AMBIEN) 10 MG tablet Take 10 mg by mouth at bedtime as needed for sleep.   Yes Historical Provider, MD  ALPRAZolam (XANAX) 0.25 MG tablet Take 1 tablet (0.25 mg total) by mouth 2 (two) times daily as needed for sleep. Patient not taking: Reported on 07/22/2014 07/15/14   Elby Showers, MD  levofloxacin (LEVAQUIN) 500 MG tablet Take 1 tablet (500 mg total) by mouth daily. Patient not taking: Reported on 07/22/2014 06/20/14   Elby Showers, MD  methylPREDNISolone (MEDROL) 4 MG tablet Take in tapering course as directed 6-5-4-3-2-1 Patient not taking: Reported on 07/22/2014 06/20/14   Elby Showers, MD  zolpidem (AMBIEN) 10 MG tablet TAKE 1 TABLET AT BEDTIME AS NEEDED Patient not taking: Reported on 07/22/2014 05/28/14   Elby Showers, MD   BP 133/74 mmHg  Pulse 87  Temp(Src) 97.7 F (36.5 C)  Resp 18  Ht 5\' 3"  (1.6 m)  Wt 139 lb 5 oz (63.192 kg)  BMI 24.68 kg/m2  SpO2 96% Physical Exam  Constitutional: She appears well-developed and well-nourished. No distress.  Awake, alert, nontoxic appearance  HENT:  Head: Normocephalic and atraumatic.  Mouth/Throat: Oropharynx is clear and moist. No oropharyngeal exudate.  Eyes: Conjunctivae are normal. No scleral icterus.  Neck: Normal range of motion. Neck supple.  Cardiovascular: Normal rate, regular rhythm, normal heart sounds and intact distal pulses.   No murmur heard. Pulmonary/Chest: Effort normal and breath sounds normal. No respiratory distress. She has no wheezes.  Equal chest expansion  Abdominal: Soft. Bowel sounds are normal. She exhibits no  distension and no mass. There is tenderness in the left lower quadrant. There is guarding. There is no rebound and no CVA tenderness.  Significant left lower quadrant abdominal pain with guarding with mild rebound.  No peritoneal signs; abdomen is not rigid  Musculoskeletal: Normal range of motion. She exhibits no edema.  Neurological: She is alert.  Speech is clear and goal oriented Moves extremities without ataxia  Skin: Skin is warm and dry. She is not diaphoretic.  Psychiatric: She has a normal mood and affect.  Nursing note and vitals reviewed.   ED Course  Procedures (including critical care time) Labs Review Labs Reviewed  CBC WITH DIFFERENTIAL - Abnormal; Notable for the following:    WBC 11.5 (*)    Neutrophils Relative % 87 (*)    Neutro Abs 10.0 (*)    Lymphocytes Relative 4 (*)    Lymphs Abs 0.5 (*)    All other components within normal limits  COMPREHENSIVE METABOLIC PANEL - Abnormal; Notable for the following:    Glucose, Bld 101 (*)    BUN 25 (*)    GFR calc non Af Amer 73 (*)    GFR calc Af Amer 85 (*)    Anion gap  17 (*)    All other components within normal limits  LIPASE, BLOOD    Imaging Review No results found.   EKG Interpretation None      MDM   Final diagnoses:  Lower abdominal pain  Nausea and vomiting  Lower abdominal pain  Nausea and vomiting   Mary Garcia presents with left lower quadrant abdominal pain, nausea vomiting and diarrhea. Patient reports pain is significant only improved this time but is present. She initially declines pain medication. Will obtain labs and CT scan. Concern for diverticulitis.  No clinical evidence of bowel perforation, but cannot rule this out.    1:17 AM Patient actively vomiting. Patient has had 2 doses of Zofran and a dose of morphine. Will give Reglan and Benadryl.  Labs with leukocytosis of 11.5, otherwise reassuring.  1:27 AM CT pending.  Case discussed with Antonietta Breach, PA-C who will follow and  disposition accordingly.    BP 133/74 mmHg  Pulse 87  Temp(Src) 97.7 F (36.5 C)  Resp 18  Ht 5\' 3"  (1.6 m)  Wt 139 lb 5 oz (63.192 kg)  BMI 24.68 kg/m2  SpO2 96%   Abigail Butts, PA-C 07/23/14 0127  Everlene Balls, MD 07/23/14 [phone]

## 2014-07-23 NOTE — ED Provider Notes (Signed)
0315 - Patient care assumed from [facility], PA-C at shift change. Patient presenting to the emergency department for vomiting, diarrhea, and sudden onset of abdominal pain. CT pending at shift change. Imaging findings reviewed which are consistent with infectious colitis. No other acute findings seen on imaging. Patient does have a mild leukocytosis to suggest potential infectious cause of symptoms today; no evidence of acute surgical abdomen.  Patient initially with persistent vomiting. This has improved significantly after treatment with Zofran and Reglan. On my presentation with the patient, she is actively drinking ginger ale. She denies any nausea at present and she appears comfortable without signs of distress. Given resolution of patient's emesis and her ability to tolerate POs, believe she is stable for outpatient management of her symptoms. Patient given first dose of Cipro and Flagyl in ED. Will continue this regimen and have prescribed Zofran for management of nausea/vomiting. Patient advised to follow-up with her primary care doctor in 48 hours for a recheck. Return precautions discussed and provided. Patient agreeable to plan with no unaddressed concerns. Patient discharged in good condition; VSS.  Filed Vitals:   07/23/14 0100 07/23/14 0209 07/23/14 0337 07/23/14 0339  BP: 136/81 126/78 118/78   Pulse:  101 86   Temp:      Resp:  18 18   Height:      Weight:      SpO2:  96% 10% 100%   Results for orders placed or performed during the hospital encounter of 07/22/14  CBC with Differential  Result Value Ref Range   WBC 11.5 (H) 4.0 - 10.5 K/uL   RBC 5.10 3.87 - 5.11 MIL/uL   Hemoglobin 14.9 12.0 - 15.0 g/dL   HCT 44.2 36.0 - 46.0 %   MCV 86.7 78.0 - 100.0 fL   MCH 29.2 26.0 - 34.0 pg   MCHC 33.7 30.0 - 36.0 g/dL   RDW 14.2 11.5 - 15.5 %   Platelets 265 150 - 400 K/uL   Neutrophils Relative % 87 (H) 43 - 77 %   Neutro Abs 10.0 (H) 1.7 - 7.7 K/uL   Lymphocytes Relative  4 (L) 12 - 46 %   Lymphs Abs 0.5 (L) 0.7 - 4.0 K/uL   Monocytes Relative 9 3 - 12 %   Monocytes Absolute 1.0 0.1 - 1.0 K/uL   Eosinophils Relative 0 0 - 5 %   Eosinophils Absolute 0.0 0.0 - 0.7 K/uL   Basophils Relative 0 0 - 1 %   Basophils Absolute 0.0 0.0 - 0.1 K/uL  Comprehensive metabolic panel  Result Value Ref Range   Sodium 137 137 - 147 mEq/L   Potassium 3.8 3.7 - 5.3 mEq/L   Chloride 97 96 - 112 mEq/L   CO2 23 19 - 32 mEq/L   Glucose, Bld 101 (H) 70 - 99 mg/dL   BUN 25 (H) 6 - 23 mg/dL   Creatinine, Ser 0.82 0.50 - 1.10 mg/dL   Calcium 10.4 8.4 - 10.5 mg/dL   Total Protein 7.8 6.0 - 8.3 g/dL   Albumin 4.5 3.5 - 5.2 g/dL   AST 23 0 - 37 U/L   ALT 19 0 - 35 U/L   Alkaline Phosphatase 79 39 - 117 U/L   Total Bilirubin 0.3 0.3 - 1.2 mg/dL   GFR calc non Af Amer 73 (L) >90 mL/min   GFR calc Af Amer 85 (L) >90 mL/min   Anion gap 17 (H) 5 - 15  Lipase, blood  Result Value Ref Range  Lipase 43 11 - 59 U/L   Ct Abdomen Pelvis W Contrast  07/23/2014   CLINICAL DATA:  Lower abdominal cramping. Nausea, vomiting, diarrhea, weakness, white cell count 11.5.  EXAM: CT ABDOMEN AND PELVIS WITH CONTRAST  TECHNIQUE: Multidetector CT imaging of the abdomen and pelvis was performed using the standard protocol following bolus administration of intravenous contrast.  CONTRAST:  50mL OMNIPAQUE IOHEXOL 300 MG/ML SOLN, 187mL OMNIPAQUE IOHEXOL 300 MG/ML SOLN  COMPARISON:  None.  FINDINGS: Lung bases are clear.  Bilateral breast implants.  The liver, spleen, gallbladder, pancreas, adrenal glands, kidneys, abdominal aorta, inferior vena cava, and retroperitoneal lymph nodes are unremarkable. Stomach and small bowel are not abnormally distended and no wall thickening is appreciated. Colon is normal caliber without wall thickening. Diffusely fluid-filled colon suggesting liquid stool and possibly related to infectious colitis. No free air or free fluid in the abdomen.  Pelvis: Bladder wall is not  thickened. Uterus and ovaries are not enlarged. No free or loculated pelvic fluid collections. No pelvic mass or lymphadenopathy. Appendix is not identified. But mild degenerative changes in the lumbar spine.  IMPRESSION: Fluid filled colon without wall thickening possibly suggesting infectious colitis. No evidence of bowel obstruction.   Electronically Signed   By: Lucienne Capers M.D.   On: 07/23/2014 01:45      Antonietta Breach, PA-C 07/23/14 [place], MD 07/23/14 [phone]

## 2014-07-23 NOTE — Discharge Instructions (Signed)
Take Ciprofloxacin and Flagyl as prescribed. Take Zofran as prescribed for nausea/vomiting. You may use a Phenergan suppository if your symptoms are not well controlled with Zofran. Recommend a clear liquid diet until symptoms resolve. Avoid milk products. You may take Norco as needed for severe pain. Follow up with your doctor in 48 hours for a recheck. Return to the ED if symptoms worsen or if you are unable to tolerate fluids by mouth as this may lead to dehydration.  Colitis Colitis is inflammation of the colon. Colitis can be a short-term or long-standing (chronic) illness. Crohn's disease and ulcerative colitis are 2 types of colitis which are chronic. They usually require lifelong treatment. CAUSES  There are many different causes of colitis, including:  Viruses.  Germs (bacteria).  Medicine reactions. SYMPTOMS   Diarrhea.  Intestinal bleeding.  Pain.  Fever.  Throwing up (vomiting).  Tiredness (fatigue).  Weight loss.  Bowel blockage. DIAGNOSIS  The diagnosis of colitis is based on examination and stool or blood tests. X-rays, CT scan, and colonoscopy may also be needed. TREATMENT  Treatment may include:  Fluids given through the vein (intravenously).  Bowel rest (nothing to eat or drink for a period of time).  Medicine for pain and diarrhea.  Medicines (antibiotics) that kill germs.  Cortisone medicines.  Surgery. HOME CARE INSTRUCTIONS   Get plenty of rest.  Drink enough water and fluids to keep your urine clear or pale yellow.  Eat a well-balanced diet.  Call your caregiver for follow-up as recommended. SEEK IMMEDIATE MEDICAL CARE IF:   You develop chills.  You have an oral temperature above 102 F (38.9 C), not controlled by medicine.  You have extreme weakness, fainting, or dehydration.  You have repeated vomiting.  You develop severe belly (abdominal) pain or are passing bloody or tarry stools. MAKE SURE YOU:   Understand these  instructions.  Will watch your condition.  Will get help right away if you are not doing well or get worse. Document Released: 09/08/2004 Document Revised: 10/24/2011 Document Reviewed: 12/04/2009 [facility] Patient Information 2015 [place], [place]. This information is not intended to replace advice given to you by your health care provider. Make sure you discuss any questions you have with your health care provider. Clear Liquid Diet A clear liquid diet is a short-term diet that is prescribed to provide the necessary fluid and basic energy you need when you can have nothing else. The clear liquid diet consists of liquids or solids that will become liquid at room temperature. You should be able to see through the liquid. There are many reasons that you may be restricted to clear liquids, such as:  When you have a sudden-onset (acute) condition that occurs before or after surgery.  To help your body slowly get adjusted to food again after a long period when you were unable to have food.  Replacement of fluids when you have a diarrheal disease.  When you are going to have certain exams, such as a colonoscopy, in which instruments are inserted inside your body to look at parts of your digestive system. WHAT CAN I HAVE? A clear liquid diet does not provide all the nutrients you need. It is important to choose a variety of the following items to get as many nutrients as possible:  Vegetable juices that do not have pulp.  Fruit juices and fruit drinks that do not have pulp.  Coffee (regular or decaffeinated), tea, or soda at the discretion of your health care provider.  Clear  bouillon, broth, or strained broth-based soups.  High-protein and flavored gelatins.  Sugar or honey.  Ices or frozen ice pops that do not contain milk. If you are not sure whether you can have certain items, you should ask your health care provider. You may also ask your health care provider if there are any other  clear liquid options. Document Released: 08/01/2005 Document Revised: 08/06/2013 Document Reviewed: 06/28/2013 [facility] Patient Information 2015 [place], [place]. This information is not intended to replace advice given to you by your health care provider. Make sure you discuss any questions you have with your health care provider.

## 2014-07-28 ENCOUNTER — Ambulatory Visit: Payer: Self-pay | Admitting: Internal Medicine

## 2014-08-28 ENCOUNTER — Encounter: Payer: Self-pay | Admitting: Internal Medicine

## 2014-08-29 ENCOUNTER — Encounter: Payer: Self-pay | Admitting: Internal Medicine

## 2014-08-29 ENCOUNTER — Other Ambulatory Visit: Payer: Medicare Other | Admitting: Internal Medicine

## 2014-08-29 DIAGNOSIS — R799 Abnormal finding of blood chemistry, unspecified: Secondary | ICD-10-CM

## 2014-08-29 LAB — CBC WITH DIFFERENTIAL/PLATELET
BASOS ABS: 0 10*3/uL (ref 0.0–0.1)
BASOS PCT: 0 % (ref 0–1)
EOS ABS: 0.1 10*3/uL (ref 0.0–0.7)
Eosinophils Relative: 1 % (ref 0–5)
HCT: 42 % (ref 36.0–46.0)
Hemoglobin: 13.9 g/dL (ref 12.0–15.0)
LYMPHS ABS: 1.1 10*3/uL (ref 0.7–4.0)
LYMPHS PCT: 21 % (ref 12–46)
MCH: 28.5 pg (ref 26.0–34.0)
MCHC: 33.1 g/dL (ref 30.0–36.0)
MCV: 86.2 fL (ref 78.0–100.0)
MPV: 9.6 fL (ref 8.6–12.4)
Monocytes Absolute: 0.4 10*3/uL (ref 0.1–1.0)
Monocytes Relative: 8 % (ref 3–12)
NEUTROS ABS: 3.7 10*3/uL (ref 1.7–7.7)
Neutrophils Relative %: 70 % (ref 43–77)
Platelets: 283 10*3/uL (ref 150–400)
RBC: 4.87 MIL/uL (ref 3.87–5.11)
RDW: 14.9 % (ref 11.5–15.5)
WBC: 5.3 10*3/uL (ref 4.0–10.5)

## 2014-09-19 ENCOUNTER — Other Ambulatory Visit: Payer: Self-pay | Admitting: Internal Medicine

## 2014-09-19 ENCOUNTER — Telehealth: Payer: Self-pay | Admitting: Internal Medicine

## 2014-09-19 NOTE — Telephone Encounter (Signed)
Called in [place] refills

## 2014-09-19 NOTE — Telephone Encounter (Signed)
Patient calls stating she doesn't feel she has bronchitis; however, she has a "cough" that is keeping her up at night.  Had a prescribed cough syrup from a year or so ago.  Tried to call that in but it was expired.  States she doesn't feel she has bronchitis, but does cough at night.  She took what little bit of cough syrup she had left last night and it seemed to help her get some rest.  Advised that I would be happy to giver he appointment to be seen this afternoon.  Patient declined appointment.  Advised she could pick up some Delsym at the drug store and try that for OTC relief.   She will try that over the weekend and call back on Monday if she is not having any relief from her symptoms.  Advised we can see her on Monday if she decides.

## 2014-09-19 NOTE — Telephone Encounter (Signed)
Refill x 6 months 

## 2014-09-20 ENCOUNTER — Encounter: Payer: Self-pay | Admitting: Internal Medicine

## 2014-09-20 NOTE — Progress Notes (Signed)
   Subjective:    Patient ID: Mary Garcia, female    DOB: 22-Oct-1945, 69 y.o.   MRN: [id number]  HPI  Patient in today with acute URI symptoms. Has had cough and congestion. History of smoking. In January 2015 she was seen here after being diagnosed with bronchitis and balloon. Initially treated with Zithromax but here was treated with IM Rocephin, Levaquin steroid taper, inhalers. Chest x-ray done January 2015 showed COPD but no pneumonia. Quit smoking in 1974.    Review of Systems     Objective:   Physical Exam  Skin warm and dry. Nodes none. Pharynx slightly injected. TMs are clear. Neck supple. Chest clear to auscultation without rales or wheezing but she has a congested cough      Assessment & Plan:  Acute URI-perhaps early bronchitis  History of COPD  Plan: Levaquin 500 milligrams daily for 10 days. Medrol tapering course going from 24 mg to 0 mg over 7 days. Albuterol inhaler 2 sprays by mouth 4 times a day. Advair inhaler 2 sprays by mouth every 12 hours.

## 2014-09-22 ENCOUNTER — Encounter: Payer: Self-pay | Admitting: Internal Medicine

## 2014-09-22 ENCOUNTER — Ambulatory Visit (INDEPENDENT_AMBULATORY_CARE_PROVIDER_SITE_OTHER): Payer: Medicare Other | Admitting: Internal Medicine

## 2014-09-22 VITALS — BP 118/76 | HR 72 | Temp 97.9°F | Wt 138.0 lb

## 2014-09-22 DIAGNOSIS — J0100 Acute maxillary sinusitis, unspecified: Secondary | ICD-10-CM

## 2014-09-22 DIAGNOSIS — J209 Acute bronchitis, unspecified: Secondary | ICD-10-CM

## 2014-09-22 MED ORDER — HYDROCODONE-HOMATROPINE 5-1.5 MG/5ML PO SYRP: 5.0000 mL | ORAL_SOLUTION | Freq: Three times a day (TID) | ORAL | Status: DC | PRN

## 2014-09-22 MED ORDER — LEVOFLOXACIN 500 MG PO TABS: 500.0000 mg | ORAL_TABLET | Freq: Every day | ORAL | Status: DC
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
## 2014-09-22 NOTE — Patient Instructions (Addendum)
Levaquin 500 mg daily for 10 days. Hycodan for cough. Use inhalers.

## 2014-10-02 ENCOUNTER — Telehealth: Payer: Self-pay | Admitting: Internal Medicine

## 2014-10-02 DIAGNOSIS — J069 Acute upper respiratory infection, unspecified: Secondary | ICD-10-CM

## 2014-10-02 NOTE — Telephone Encounter (Signed)
Please refer to [facility].

## 2014-10-02 NOTE — Telephone Encounter (Signed)
Scheduled appt for patient with [facility]. Appt is March 18,2016 at 11:30 with Dr Gwenette Greet . Patient is to arrive at 11:15 to complete paperwork. Bring medications to appt. Left message for patient to call back to get appt information

## 2014-10-02 NOTE — Telephone Encounter (Signed)
Pt has had 4 episodes of URI in last year or so and would like to be referred to pulmonary to be seen.  Best number to call pt is [phone]/ [phone]
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
## 2014-10-31 ENCOUNTER — Encounter: Payer: Self-pay | Admitting: Pulmonary Disease

## 2014-10-31 ENCOUNTER — Ambulatory Visit (INDEPENDENT_AMBULATORY_CARE_PROVIDER_SITE_OTHER): Payer: Medicare Other | Admitting: Pulmonary Disease

## 2014-10-31 ENCOUNTER — Ambulatory Visit (INDEPENDENT_AMBULATORY_CARE_PROVIDER_SITE_OTHER)
Admission: RE | Admit: 2014-10-31 | Discharge: 2014-10-31 | Disposition: A | Discharge status: Home / Self Care | Payer: Medicare Other | Source: Ambulatory Visit | Attending: Pulmonary Disease | Admitting: Pulmonary Disease

## 2014-10-31 VITALS — BP 132/78 | HR 85 | Temp 97.0°F | Ht 63.0 in | Wt 139.0 lb

## 2014-10-31 DIAGNOSIS — J45909 Unspecified asthma, uncomplicated: Secondary | ICD-10-CM | POA: Diagnosis not present

## 2014-10-31 NOTE — Progress Notes (Signed)
   Subjective:    Patient ID: Mary Garcia, female    DOB: [birth year], 69 y.o.   MRN: [id number]  HPI The patient is a 69 year old female who I've been asked to see for recurrent upper and lower respiratory infections. She has minimal smoking history and has not smoked since 1980, and denies any prior history of asthma. She has had 4 episodes in the last 1-1/2 years of "bronchitis", with the last being in January of this year. She describes these episodes as the development of sinus congestion with postnasal drip, sinus pressure with purulence from her nose, and then ultimately develops chest congestion with expectoration of discolored mucus. She also develops rattling in her chest and some shortness of breath, and usually is treated with a course of antibiotics as well as prednisone before her symptoms will resolve. In between these episodes, she has no breathing issues and excellent exertional tolerance. She stays very active with bike riding, hiking, as well as walking. She has no chronic pulmonary symptoms in between these episodes. The patient had a chest x-ray in January 2015 that showed hyperinflation, but no acute process. She has never had pulmonary function studies. She denies any significant allergy issues, and has only rare reflux symptoms. She has been tried on Advair since her first episode of bronchitis, but has only taken this intermittently.  She has not used in over one month, and is unsure if the inhaler helps her. It should be noted that she has been taking care of her grandchild for the last 15 months, but does not feel this is the source of her recurrent episodes.   Review of Systems  Constitutional: Negative for fever and unexpected weight change.  HENT: Negative for congestion, dental problem, ear pain, nosebleeds, postnasal drip, rhinorrhea, sinus pressure, sneezing, sore throat and trouble swallowing.   Eyes: Negative for redness and itching.  Respiratory: Negative for cough, chest  tightness, shortness of breath and wheezing.   Cardiovascular: Negative for palpitations and leg swelling.  Gastrointestinal: Negative for nausea and vomiting.  Genitourinary: Negative for dysuria.  Musculoskeletal: Negative for joint swelling.  Skin: Negative for rash.  Neurological: Negative for headaches.  Hematological: Does not bruise/bleed easily.  Psychiatric/Behavioral: Negative for dysphoric mood. The patient is not nervous/anxious.        Objective:   Physical Exam Constitutional:  Well developed, no acute distress  HENT:  Nares patent without discharge, mild erythema and edema of nasal mucosa  Oropharynx without exudate, palate and uvula are normal  Eyes:  Perrla, eomi, no scleral icterus  Neck:  No JVD, no TMG  Cardiovascular:  Normal rate, regular rhythm, no rubs or gallops.  No murmurs        Intact distal pulses  Pulmonary :  Normal breath sounds, no stridor or respiratory distress   No rales, rhonchi, or wheezing  Abdominal:  Soft, nondistended, bowel sounds present.  No tenderness noted.   Musculoskeletal:  No lower extremity edema noted.  Lymph Nodes:  No cervical lymphadenopathy noted  Skin:  No cyanosis noted  Neurologic:  Alert, appropriate, moves all 4 extremities without obvious deficit.         Assessment & Plan:

## 2014-10-31 NOTE — Assessment & Plan Note (Signed)
The patient has had 4 episodes of what sounds like asthmatic bronchitis in the last 1-1/2 years. It is unclear if there is some underlying process that is causing these episodes, or whether it is simply that she has a poor response to what may be a simple viral process. She has been keeping her grandchild for about 15 months, and certainly this is a common scenario that results in recurrent viral infections. However, she also describes a lot of sinus issues, with congestion, pressure, and significant purulence from her nose during each of these episodes. It makes me wonder if she may have some underlying chronic sinusitis which leads to her recurrent episodes. Regarding her response to these episodes, it sounds like she develops significant airway inflammation and prolonged lower airway symptoms that would be consistent with asthmatic bronchitis.  It raises the question whether she has lower airways disease that is not a problem for her in between these episodes, but results in bronchospasm and prolonged symptoms whenever she gets sick. She has hyperinflation on her chest x-ray, but this is not diagnostic of COPD or asthma. She is going to need pulmonary function studies for evaluation. I think it would also be beneficial to x-ray her sinuses to make sure that she does not have chronic changes that are predisposing her to these episodes.

## 2014-10-31 NOTE — Patient Instructions (Signed)
Stay off advair for now. Will schedule for breathing studies, and see you back on the same day for review. Will check xray of your sinuses given your recurrent episodes and history of sinus issues.

## 2014-11-04 ENCOUNTER — Telehealth: Payer: Self-pay | Admitting: Pulmonary Disease

## 2014-11-04 NOTE — Telephone Encounter (Signed)
Called and spoke to pt. Informed pt of the results and recs per [facility]. Pt verbalized understanding and denied any further questions or concerns at this time.    Notes Recorded by Inge Rise, CMA on 11/04/2014 at 9:39 AM [id number] x1 ------  Notes Recorded by Kathee Delton, MD on 10/31/2014 at 5:30 PM Let pt know that her cxr is clear. There is mild hyperinflation. Await the results of her breathing studies.

## 2014-11-04 NOTE — Progress Notes (Signed)
Quick Note:  Called and spoke to pt. Informed pt of the results and recs per [facility]. Pt verbalized understanding and denied any further questions or concerns at this time. ______

## 2014-11-05 ENCOUNTER — Ambulatory Visit (INDEPENDENT_AMBULATORY_CARE_PROVIDER_SITE_OTHER)
Admission: RE | Admit: 2014-11-05 | Discharge: 2014-11-05 | Disposition: A | Discharge status: Home / Self Care | Payer: Medicare Other | Source: Ambulatory Visit | Attending: Pulmonary Disease | Admitting: Pulmonary Disease

## 2014-11-05 DIAGNOSIS — J45909 Unspecified asthma, uncomplicated: Secondary | ICD-10-CM

## 2014-11-17 ENCOUNTER — Encounter: Payer: Self-pay | Admitting: Pulmonary Disease

## 2014-11-17 ENCOUNTER — Encounter (HOSPITAL_COMMUNITY): Payer: Medicare Other

## 2014-11-17 ENCOUNTER — Ambulatory Visit (HOSPITAL_COMMUNITY)
Admission: RE | Admit: 2014-11-17 | Discharge: 2014-11-17 | Disposition: A | Discharge status: Home / Self Care | Payer: Medicare Other | Source: Ambulatory Visit | Attending: Pulmonary Disease | Admitting: Pulmonary Disease

## 2014-11-17 ENCOUNTER — Other Ambulatory Visit: Payer: Self-pay

## 2014-11-17 ENCOUNTER — Ambulatory Visit (INDEPENDENT_AMBULATORY_CARE_PROVIDER_SITE_OTHER): Payer: Medicare Other | Admitting: Pulmonary Disease

## 2014-11-17 VITALS — BP 124/72 | HR 72 | Temp 97.2°F | Ht 63.0 in | Wt 141.0 lb

## 2014-11-17 DIAGNOSIS — J4520 Mild intermittent asthma, uncomplicated: Secondary | ICD-10-CM | POA: Diagnosis not present

## 2014-11-17 DIAGNOSIS — J45909 Unspecified asthma, uncomplicated: Secondary | ICD-10-CM | POA: Insufficient documentation

## 2014-11-17 LAB — PULMONARY FUNCTION TEST
DL/VA % pred: 98 %
DL/VA: 4.6 ml/min/mmHg/L
DLCO UNC % PRED: 88 %
DLCO unc: 20.17 ml/min/mmHg
FEF 25-75 POST: 2.09 L/s
FEF 25-75 PRE: 1.24 L/s
FEF2575-%Change-Post: 68 %
FEF2575-%PRED-POST: 103 %
FEF2575-%Pred-Pre: 61 %
FEV1-%Change-Post: 17 %
FEV1-%PRED-POST: 89 %
FEV1-%Pred-Pre: 76 %
FEV1-PRE: 1.73 L
FEV1-Post: 2.03 L
FEV1FVC-%Change-Post: 3 %
FEV1FVC-%Pred-Pre: 92 %
FEV6-%Change-Post: 13 %
FEV6-%Pred-Post: 96 %
FEV6-%Pred-Pre: 84 %
FEV6-POST: 2.76 L
FEV6-Pre: 2.43 L
FEV6FVC-%CHANGE-POST: 0 %
FEV6FVC-%PRED-POST: 103 %
FEV6FVC-%Pred-Pre: 104 %
FVC-%CHANGE-POST: 13 %
FVC-%PRED-POST: 92 %
FVC-%PRED-PRE: 81 %
FVC-PRE: 2.43 L
FVC-Post: 2.77 L
PRE FEV1/FVC RATIO: 71 %
PRE FEV6/FVC RATIO: 100 %
Post FEV1/FVC ratio: 73 %
Post FEV6/FVC ratio: 100 %
RV % pred: 111 %
RV: 2.31 L
TLC % pred: 98 %
TLC: 4.85 L

## 2014-11-17 MED ORDER — ALBUTEROL SULFATE (2.5 MG/3ML) 0.083% IN NEBU
2.5000 mg | INHALATION_SOLUTION | Freq: Once | RESPIRATORY_TRACT | Status: AC
  Administered 2014-11-17: 2.5 mg via RESPIRATORY_TRACT

## 2014-11-17 MED ORDER — BECLOMETHASONE DIPROPIONATE 80 MCG/ACT IN AERS: 2.0000 | INHALATION_SPRAY | Freq: Two times a day (BID) | RESPIRATORY_TRACT | Status: DC

## 2014-11-17 NOTE — Patient Instructions (Signed)
Continue to stay off advair Start on qvar 80, 2 inhalations each am with spacer.  Rinse mouth well after using Can continue with albuterol as needed only.  followup with me in 8 weeks, but call if having issues.

## 2014-11-17 NOTE — Assessment & Plan Note (Signed)
The patient has a significant response to bronchodilators on her PFTs today, but her numbers pre-and post bronchodilators are essentially normal. I suspect she does have mild asthma based on her significant bronchodilator response. I have had a long discussion about how to approach this, and we can either not do anything different and see how she does, versus starting her on inhaled corticosteroids. The difficulty is that she is totally asymptomatic day-to-day in between her episodes of bronchitis. The downside of not treating her with inhaled steroids is that she could have ongoing airway inflammation over the years that leads to chronic airflow limitation. She may also find that her breathing gets better day-to-day, or certainly her episodes will be attenuated on inhaled steroids. She has agreed to give this a try, and we'll start with low-dose Qvar. Of note, she did have a recent CT sinus that was totally clear.

## 2014-11-17 NOTE — Progress Notes (Signed)
   Subjective:    Patient ID: Mary Garcia, female    DOB: 1945-08-27, 69 y.o.   MRN: [id number]  HPI The patient comes in today for follow-up of her recent PFTs and CT sinus. Had no evidence for acute or chronic sinusitis, and her PFTs did not show any airflow obstruction, restriction, or DLCO abnormality. However, she did have a 17% improvement in her FEV1 with bronchodilators, and this is most consistent with reversible airways disease.   Review of Systems  Constitutional: Negative for fever and unexpected weight change.  HENT: Negative for congestion, dental problem, ear pain, nosebleeds, postnasal drip, rhinorrhea, sinus pressure, sneezing, sore throat and trouble swallowing.   Eyes: Negative for redness and itching.  Respiratory: Negative for cough, chest tightness, shortness of breath and wheezing.   Cardiovascular: Negative for palpitations and leg swelling.  Gastrointestinal: Negative for nausea and vomiting.  Genitourinary: Negative for dysuria.  Musculoskeletal: Negative for joint swelling.  Skin: Negative for rash.  Neurological: Negative for headaches.  Hematological: Does not bruise/bleed easily.  Psychiatric/Behavioral: Negative for dysphoric mood. The patient is not nervous/anxious.        Objective:   Physical Exam Well-developed female in no acute distress Nose without purulence or discharge noted Neck without lymphadenopathy or thyromegaly Lower extremities without edema, no cyanosis Alert and oriented, moves all 4 extremities.       Assessment & Plan:

## 2015-01-15 ENCOUNTER — Encounter: Payer: Self-pay | Admitting: Pulmonary Disease

## 2015-01-15 ENCOUNTER — Ambulatory Visit (INDEPENDENT_AMBULATORY_CARE_PROVIDER_SITE_OTHER): Payer: Medicare Other | Admitting: Pulmonary Disease

## 2015-01-15 VITALS — BP 110/62 | HR 75 | Temp 97.8°F | Ht 63.0 in | Wt 140.2 lb

## 2015-01-15 DIAGNOSIS — J4520 Mild intermittent asthma, uncomplicated: Secondary | ICD-10-CM

## 2015-01-15 NOTE — Progress Notes (Signed)
   Subjective:    Patient ID: Mary Garcia, female    DOB: 11-04-1945, 69 y.o.   MRN: [id number]  HPI The patient comes in today for follow-up of her probable mild asthma. She was having recurrent episodes of acute asthmatic bronchitis with viral illnesses, and although her PFTs did not show significant obstruction, they did show a significant improvement with albuterol.  She was started on Qvar as a trial, and although she has not seen improvement in her breathing, she did avoid a significant respiratory illness after getting a cold from her grandchild.   Review of Systems  Constitutional: Negative for fever and unexpected weight change.  HENT: Negative for congestion, dental problem, ear pain, nosebleeds, postnasal drip, rhinorrhea, sinus pressure, sneezing, sore throat and trouble swallowing.   Eyes: Negative for redness and itching.  Respiratory: Positive for cough and shortness of breath. Negative for chest tightness and wheezing.   Cardiovascular: Negative for palpitations and leg swelling.  Gastrointestinal: Negative for nausea and vomiting.  Genitourinary: Negative for dysuria.  Musculoskeletal: Negative for joint swelling.  Skin: Negative for rash.  Neurological: Negative for headaches.  Hematological: Does not bruise/bleed easily.  Psychiatric/Behavioral: Negative for dysphoric mood. The patient is not nervous/anxious.        Objective:   Physical Exam Well-developed female in no acute distress Nose without purulence or discharge noted Neck without lymphadenopathy or thyromegaly Chest totally clear to auscultation, no wheezing Cardiac exam with regular rate and rhythm Lower extremities without edema, no cyanosis Alert and oriented, moves all 4 extremities.       Assessment & Plan:

## 2015-01-15 NOTE — Assessment & Plan Note (Signed)
The patient has been on Qvar compliantly since last visit, and although she has not had a change in her breathing, she did not have any respiratory issues after being exposed to her grandchildren who were sick at the time. In the past, she had always developed acute asthmatic bronchitis requiring more aggressive treatment. She feels the advantage of staying on Qvar in order to keep from getting respiratory issues with her viral illnesses outweighs any issues with chronic ICS.  She has not yet gotten back to her exercise program, and she may see a difference in her breathing when she pushes herself further.

## 2015-01-15 NOTE — Patient Instructions (Signed)
Stay on qvar once a day, and let us know if increasing symptoms. followup with Dr. Lake Bells in 16mos.

## 2015-04-02 ENCOUNTER — Encounter: Payer: Self-pay | Admitting: Internal Medicine

## 2015-04-02 ENCOUNTER — Ambulatory Visit (INDEPENDENT_AMBULATORY_CARE_PROVIDER_SITE_OTHER): Payer: Medicare Other | Admitting: Internal Medicine

## 2015-04-02 VITALS — BP 116/72 | HR 81 | Temp 97.9°F | Wt 139.0 lb

## 2015-04-02 DIAGNOSIS — Z8709 Personal history of other diseases of the respiratory system: Secondary | ICD-10-CM | POA: Diagnosis not present

## 2015-04-02 DIAGNOSIS — J069 Acute upper respiratory infection, unspecified: Secondary | ICD-10-CM | POA: Diagnosis not present

## 2015-04-02 MED ORDER — LEVOFLOXACIN 500 MG PO TABS: 500.0000 mg | ORAL_TABLET | Freq: Every day | ORAL | Status: DC

## 2015-04-02 MED ORDER — HYDROCODONE-HOMATROPINE 5-1.5 MG/5ML PO SYRP: 5.0000 mL | ORAL_SOLUTION | Freq: Three times a day (TID) | ORAL | Status: DC | PRN

## 2015-04-02 NOTE — Patient Instructions (Signed)
Levaquin 500 milligrams daily for 10 days. Hycodan 1 teaspoon by mouth every 8 hours when necessary cough. Call if not better in 10 days or sooner if worse

## 2015-04-02 NOTE — Progress Notes (Signed)
   Subjective:    Patient ID: Mary Garcia, female    DOB: February 07, 1946, 69 y.o.   MRN: [id number]  HPI Came down with URI on trip to [facility] recently. Has discolored sputum production cough and congestion. No fever or shaking chills. No wheezing. Saw Dr. claimants recently and was diagnosed with mild asthma not COPD. Has been using inhalers during this illness. No shortness of breath    Review of Systems see above     Objective:   Physical Exam  Skin warm and dry. Nodes none. TMs and pharynx are clear. Neck is supple without adenopathy. Chest clear to auscultation without rales or wheezing      Assessment & Plan:  Acute URI  History of asthma  Plan: Levaquin 500 milligrams daily for 10 days. Continue use of inhalers. I'll not give steroids at this point in time. She requests narcotic cough medication. Prescribed Hycodan 1 teaspoon by mouth every 8 hours when necessary cough.

## 2015-04-07 ENCOUNTER — Other Ambulatory Visit: Payer: Self-pay | Admitting: Internal Medicine

## 2015-04-07 NOTE — Telephone Encounter (Signed)
Refill x 3 months 

## 2015-04-16 ENCOUNTER — Encounter: Payer: Self-pay | Admitting: Internal Medicine

## 2015-04-27 ENCOUNTER — Encounter: Payer: Self-pay | Admitting: Internal Medicine

## 2015-04-27 ENCOUNTER — Ambulatory Visit (INDEPENDENT_AMBULATORY_CARE_PROVIDER_SITE_OTHER): Payer: Medicare Other | Admitting: Internal Medicine

## 2015-04-27 VITALS — BP 108/70 | HR 61 | Temp 97.1°F | Wt 138.0 lb

## 2015-04-27 DIAGNOSIS — M8580 Other specified disorders of bone density and structure, unspecified site: Secondary | ICD-10-CM | POA: Diagnosis not present

## 2015-04-27 DIAGNOSIS — Z23 Encounter for immunization: Secondary | ICD-10-CM | POA: Diagnosis not present

## 2015-04-27 NOTE — Progress Notes (Signed)
   Subjective:    Patient ID: Mary Garcia, female    DOB: 05/05/1946, 69 y.o.   MRN: [id number]  HPI Here today at my request discuss recent bone density study. Her last bone density study was done in 2012. She says she took [place] for proximally 4 years in the past. [facility] bone density study shows a 3.9% decrease in the LS spine despite presence of scoliosis and mild generative changes compared to exam July 2012 and this is considered statistically significant. Bone density study of the left hip when compared to previous exam July 2012 demonstrates a 0.8% decrease which is not considered significant. Bone density in right heel compared to previous exam July 2012 demonstrates a 4.1% decrease and this is considered significant.  She is a former smoker. Her mother had osteoporosis.  Current T score is -1.7 in the left femoral neck and -2.1 in the LS-spine. T score is -1.6 in the right femoral neck.    Review of Systems     Objective:   Physical Exam  Not examined. Spent 15 minutes discussing bone density study results and options      Assessment & Plan:  Osteopenia  Plan: She says she's taken vitamin D on a daily basis and walking. Also doing yoga. Drinks a lot of milk. I suggested that we continue this and recheck bone density study in one year. We discussed other options such as Prolia injections. It is been 4 years since last bone density study. I do not want to wait that long again before making another measurement. I think one year it is more reasonable than waiting 2 years.  Flu vaccine given.

## 2015-04-27 NOTE — Patient Instructions (Signed)
Continue vitamin D supplementation. Recheck bone density in one year.

## 2015-05-05 ENCOUNTER — Ambulatory Visit: Payer: Medicare Other | Admitting: Internal Medicine

## 2015-06-04 ENCOUNTER — Ambulatory Visit (INDEPENDENT_AMBULATORY_CARE_PROVIDER_SITE_OTHER): Payer: Medicare Other | Admitting: Internal Medicine

## 2015-06-04 ENCOUNTER — Encounter: Payer: Self-pay | Admitting: Internal Medicine

## 2015-06-04 VITALS — BP 120/76 | HR 77 | Temp 98.2°F | Resp 20 | Ht 63.0 in | Wt 136.0 lb

## 2015-06-04 DIAGNOSIS — J9801 Acute bronchospasm: Secondary | ICD-10-CM

## 2015-06-04 MED ORDER — HYDROCODONE-HOMATROPINE 5-1.5 MG/5ML PO SYRP: ORAL_SOLUTION | ORAL | Status: DC

## 2015-06-04 MED ORDER — LEVOFLOXACIN 500 MG PO TABS: 500.0000 mg | ORAL_TABLET | Freq: Every day | ORAL | Status: DC

## 2015-06-04 MED ORDER — PREDNISONE 10 MG PO TABS: ORAL_TABLET | ORAL | Status: DC

## 2015-06-14 NOTE — Patient Instructions (Signed)
Take Levaquin and prednisone as directed. Use inhaler and take Hycodan sparingly.

## 2015-06-14 NOTE — Progress Notes (Signed)
   Subjective:    Patient ID: Mary Garcia, female    DOB: 02/05/1946, 69 y.o.   MRN: [id number]  HPI Martin Majestic on trip to [place]. Upon return came down with upper respiratory infection symptoms. This is frustrating to her. Has had cough congestion wheezing malaise and fatigue. No documented fever.    Review of Systems     Objective:   Physical Exam Skin warm and dry. Nodes none. TMs and pharynx are clear. Neck is supple without adenopathy JVD or thyromegaly. Chest clear to auscultation without rales today       Assessment & Plan:  History of COPD  Acute bronchitis with bronchospasm  Plan: Sterapred DS 10 mg 6 day dosepak. Levaquin 500 milligrams daily for 10 days. Hycodan sparingly as needed for cough. Use albuterol inhaler 2 sprays 4 times daily orally

## 2015-07-17 ENCOUNTER — Other Ambulatory Visit: Payer: Medicare Other | Admitting: Internal Medicine

## 2015-07-17 DIAGNOSIS — Z79899 Other long term (current) drug therapy: Secondary | ICD-10-CM

## 2015-07-17 DIAGNOSIS — M8580 Other specified disorders of bone density and structure, unspecified site: Secondary | ICD-10-CM

## 2015-07-17 DIAGNOSIS — Z0000 Encounter for general adult medical examination without abnormal findings: Secondary | ICD-10-CM

## 2015-07-17 LAB — COMPLETE METABOLIC PANEL WITH GFR
ALT: 23 U/L (ref 6–29)
AST: 22 U/L (ref 10–35)
Albumin: 3.9 g/dL (ref 3.6–5.1)
Alkaline Phosphatase: 67 U/L (ref 33–130)
BUN: 17 mg/dL (ref 7–25)
CALCIUM: 9.3 mg/dL (ref 8.6–10.4)
CHLORIDE: 106 mmol/L (ref 98–110)
CO2: 29 mmol/L (ref 20–31)
Creat: 0.67 mg/dL (ref 0.50–0.99)
GFR, Est Non African American: >89 mL/min (ref >=60)
Glucose, Bld: 101 mg/dL — ABNORMAL HIGH (ref 65–99)
POTASSIUM: 4.7 mmol/L (ref 3.5–5.3)
Sodium: 142 mmol/L (ref 135–146)
Total Bilirubin: 0.4 mg/dL (ref 0.2–1.2)
Total Protein: 6.6 g/dL (ref 6.1–8.1)

## 2015-07-17 LAB — CBC WITH DIFFERENTIAL/PLATELET
Basophils Absolute: 0 10*3/uL (ref 0.0–0.1)
Basophils Relative: 0 % (ref 0–1)
EOS ABS: 0.1 10*3/uL (ref 0.0–0.7)
EOS PCT: 1 % (ref 0–5)
HEMATOCRIT: 42 % (ref 36.0–46.0)
Hemoglobin: 14 g/dL (ref 12.0–15.0)
LYMPHS PCT: 16 % (ref 12–46)
Lymphs Abs: 1.1 10*3/uL (ref 0.7–4.0)
MCH: 29 pg (ref 26.0–34.0)
MCHC: 33.3 g/dL (ref 30.0–36.0)
MCV: 87.1 fL (ref 78.0–100.0)
MONO ABS: 0.4 10*3/uL (ref 0.1–1.0)
MPV: 9.2 fL (ref 8.6–12.4)
Monocytes Relative: 6 % (ref 3–12)
Neutro Abs: 5.2 10*3/uL (ref 1.7–7.7)
Neutrophils Relative %: 77 % (ref 43–77)
Platelets: 347 10*3/uL (ref 150–400)
RBC: 4.82 MIL/uL (ref 3.87–5.11)
RDW: 14.5 % (ref 11.5–15.5)
WBC: 6.8 10*3/uL (ref 4.0–10.5)

## 2015-07-17 LAB — LIPID PANEL
CHOL/HDL RATIO: 2.1 ratio (ref <=5.0)
CHOLESTEROL: 203 mg/dL — AB (ref 125–200)
HDL: 96 mg/dL (ref >=46)
LDL Cholesterol: 94 mg/dL (ref <130)
TRIGLYCERIDES: 65 mg/dL (ref <150)
VLDL: 13 mg/dL (ref <30)

## 2015-07-17 LAB — TSH: TSH: 1.274 u[IU]/mL (ref 0.350–4.500)

## 2015-07-18 LAB — VITAMIN D 25 HYDROXY (VIT D DEFICIENCY, FRACTURES): Vit D, 25-Hydroxy: 35 ng/mL (ref 30–100)

## 2015-07-20 ENCOUNTER — Ambulatory Visit (INDEPENDENT_AMBULATORY_CARE_PROVIDER_SITE_OTHER): Payer: Medicare Other | Admitting: Internal Medicine

## 2015-07-20 ENCOUNTER — Encounter: Payer: Self-pay | Admitting: Internal Medicine

## 2015-07-20 VITALS — BP 116/78 | HR 84 | Temp 97.4°F | Resp 20 | Ht 62.5 in | Wt 137.0 lb

## 2015-07-20 DIAGNOSIS — Z9882 Breast implant status: Secondary | ICD-10-CM

## 2015-07-20 DIAGNOSIS — G4700 Insomnia, unspecified: Secondary | ICD-10-CM

## 2015-07-20 DIAGNOSIS — R1310 Dysphagia, unspecified: Secondary | ICD-10-CM | POA: Diagnosis not present

## 2015-07-20 DIAGNOSIS — K219 Gastro-esophageal reflux disease without esophagitis: Secondary | ICD-10-CM | POA: Diagnosis not present

## 2015-07-20 DIAGNOSIS — Z8709 Personal history of other diseases of the respiratory system: Secondary | ICD-10-CM

## 2015-07-20 DIAGNOSIS — F411 Generalized anxiety disorder: Secondary | ICD-10-CM

## 2015-07-20 DIAGNOSIS — Z0000 Encounter for general adult medical examination without abnormal findings: Secondary | ICD-10-CM | POA: Diagnosis not present

## 2015-07-20 DIAGNOSIS — M8580 Other specified disorders of bone density and structure, unspecified site: Secondary | ICD-10-CM | POA: Diagnosis not present

## 2015-07-20 DIAGNOSIS — Z8639 Personal history of other endocrine, nutritional and metabolic disease: Secondary | ICD-10-CM | POA: Diagnosis not present

## 2015-07-20 DIAGNOSIS — M172 Bilateral post-traumatic osteoarthritis of knee: Secondary | ICD-10-CM | POA: Diagnosis not present

## 2015-07-20 DIAGNOSIS — J359 Chronic disease of tonsils and adenoids, unspecified: Secondary | ICD-10-CM

## 2015-07-20 LAB — POCT URINALYSIS DIPSTICK
Bilirubin, UA: NEGATIVE
Blood, UA: NEGATIVE
GLUCOSE UA: NEGATIVE
Ketones, UA: NEGATIVE
LEUKOCYTES UA: NEGATIVE
NITRITE UA: NEGATIVE
Protein, UA: NEGATIVE
SPEC GRAV UA: 1.02
UROBILINOGEN UA: 0.2
pH, UA: 7

## 2015-07-21 ENCOUNTER — Other Ambulatory Visit: Payer: Self-pay

## 2015-07-21 MED ORDER — ZOLPIDEM TARTRATE 10 MG PO TABS: 10.0000 mg | ORAL_TABLET | Freq: Every day | ORAL | Status: DC

## 2015-07-21 NOTE — Telephone Encounter (Signed)
Phoned to pharmacy 

## 2015-07-22 ENCOUNTER — Telehealth: Payer: Self-pay

## 2015-07-22 NOTE — Telephone Encounter (Signed)
Referral appt. Made for Dr. Janace Hoard with [facility] for Tuesday December 13 @12 :30;  Referral appt. Made for Dr. Faythe Dingwall as Dr. Olevia Perches has retired for Thursday Dec. 8th @ 9:00.  Attempted to contact patient on both cell and home phone. I have left messages on both advising of both appt.

## 2015-07-23 ENCOUNTER — Telehealth: Payer: Self-pay | Admitting: Internal Medicine

## 2015-07-23 ENCOUNTER — Ambulatory Visit: Payer: Medicare Other | Admitting: Gastroenterology

## 2015-07-23 MED ORDER — ESOMEPRAZOLE MAGNESIUM 40 MG PO CPDR: 40.0000 mg | DELAYED_RELEASE_CAPSULE | Freq: Every day | ORAL | Status: DC

## 2015-07-23 NOTE — Telephone Encounter (Signed)
Patient notified

## 2015-07-23 NOTE — Telephone Encounter (Signed)
Please prescribe Nexium 40 mg daily with 3 refills

## 2015-07-23 NOTE — Telephone Encounter (Signed)
Patient states that she was thinking that you wanted to prescribe her something for reflux.   She has had to re-schedule her appointment for February due to being out of town.  She's on a waiting list.    Pharmacy:  [facility]

## 2015-07-27 ENCOUNTER — Ambulatory Visit (INDEPENDENT_AMBULATORY_CARE_PROVIDER_SITE_OTHER): Payer: Medicare Other | Admitting: Pulmonary Disease

## 2015-07-27 ENCOUNTER — Encounter: Payer: Self-pay | Admitting: Pulmonary Disease

## 2015-07-27 ENCOUNTER — Other Ambulatory Visit: Payer: Medicare Other

## 2015-07-27 VITALS — BP 114/70 | HR 70 | Ht 63.0 in | Wt 138.0 lb

## 2015-07-27 DIAGNOSIS — J45909 Unspecified asthma, uncomplicated: Secondary | ICD-10-CM | POA: Diagnosis not present

## 2015-07-27 DIAGNOSIS — J4520 Mild intermittent asthma, uncomplicated: Secondary | ICD-10-CM

## 2015-07-27 NOTE — Assessment & Plan Note (Signed)
She has been having worsening asthma symptoms in the last 3-4 months. This has been associated with an increased frequency of infections. Today on exam her lungs are clear. She states that whenever she takes an antibiotic her symptoms improve, though she does note that she frequently takes prednisone or some shortness steroid when she received the antibiotics. I explained to her today that the differential diagnosis here is that #1 she may be having more frequent infections, or #2 she's not having any more frequent infections and she has her entire life but now the diagnosis of asthma she is more symptomatic. Less likely is that she may of had a blood clot after she came back from [place]. Her symptoms do suggest airway symptoms from an infection so I think it's likely that she is just having poorly controlled asthma right now.  Plan: Increase Qvar 4 puffs twice a day Serum immunoglobulin profile Serum Rast testing Serum IgE D-dimer Follow-up 4-6 weeks or sooner if needed  Greater than 50% of time was spent face-to-face in a 25 minute office visit

## 2015-07-27 NOTE — Patient Instructions (Signed)
Increase the dose of Qvar to 4 puffs twice a day We will call you with the results of today's blood work We will see back in 4-6 weeks or sooner if needed, over book okay

## 2015-07-27 NOTE — Progress Notes (Signed)
Refill x 3 months 

## 2015-07-27 NOTE — Progress Notes (Signed)
Subjective:    Patient ID: Mary Garcia, female    DOB: [birth year], 69 y.o.   MRN: [id number]  Synopsis: Former patient of Dr. Gwenette Greet who has asthma Dr. Gwenette Greet summarized her clinical situation as follows: PFT's 11/2014: FEV1 1.73 (76%), ratio 71, 17% response to BD. No restriction, and DLCO is normal  Pt not overly symptomatic, except acute asthmatic bronchitis every time she got URI She smoked briefly in college.   No family history of lung disease Mother was diagnosed with asthma in adulthood  HPI Chief Complaint  Patient presents with  . Follow-up    former [facility] pt being treated for asthma.  Pt states she's on her 2nd round of abx for URI since October.     Mary Garcia says that the last three years have been pretty rough.  She said that she was diagnosed with "COPD" from a chest x-ray finding.  She wonders how severe her disease is.  She went to [place] and biked for six days and hasn't been feeling better since then.  She has had recurrent respiratory infections since then.  She has been treated with two separate antibiocs since then, maybe 6 rounds of antibiotics in the last year.  She says taht her symptoms start with sore throat, then a dry cough.  She wonders if she has acid reflux and is to see GI in February.  She is to see ENT on Wednesday and she was told that she as "asymmetry in her left tonsil".  She is supposed to start Nexium as Rx'd by her PCP.  She typically has heartburn 2-3 times per month at night.  She doesn't feel like her dyspnea has improved since when she went to [place] several months ago.    She continues to use QVar regularly. She feels chest tightness, albuterol helps.  She is using albuterol maybe 5 times per week.  Past Medical History  Diagnosis Date  . Bronchitis, acute   . Anxiety   . Arthritis       Review of Systems     Objective:   Physical Exam Filed Vitals:   07/27/15 1118  BP: 114/70  Pulse: 70  Height: 5\' 3"  (1.6 m)  Weight: 138 lb  (62.596 kg)  SpO2: 100%   RA  Gen: well appearing, no acute distress HENT: NCAT, OP clear, neck supple without masses Eyes: PERRL, EOMi Lymph: no cervical lymphadenopathy PULM: CTA B CV: RRR, no mgr, no JVD GI: BS+, soft, nontender, no hsm Derm: no rash or skin breakdown MSK: normal bulk and tone Neuro: A&Ox4, CN II-XII intact, strength 5/5 in all 4 extremities Psyche: normal mood and affect  Chest x-[facility] images from [facility] medical personally reviewed showing air-trapping but no pulmonary parenchymal process. Lab work from her primary care physician's office reviewed, normal white blood cell count, normal differential       Assessment & Plan:  Asthma She has been having worsening asthma symptoms in the last 3-4 months. This has been associated with an increased frequency of infections. Today on exam her lungs are clear. She states that whenever she takes an antibiotic her symptoms improve, though she does note that she frequently takes prednisone or some shortness steroid when she received the antibiotics. I explained to her today that the differential diagnosis here is that #1 she may be having more frequent infections, or #2 she's not having any more frequent infections and she has her entire life but now the diagnosis of asthma she  is more symptomatic. Less likely is that she may of had a blood clot after she came back from [place]. Her symptoms do suggest airway symptoms from an infection so I think it's likely that she is just having poorly controlled asthma right now.  Plan: Increase Qvar 4 puffs twice a day Serum immunoglobulin profile Serum Rast testing Serum IgE D-dimer Follow-up 4-6 weeks or sooner if needed  Greater than 50% of time was spent face-to-face in a 25 minute office visit     Current outpatient prescriptions:  .  albuterol (PROVENTIL HFA;VENTOLIN HFA) 108 (90 BASE) MCG/ACT inhaler, Inhale 2 puffs into the lungs every 6 (six) hours as needed for wheezing or  shortness of breath., Disp: 1 Inhaler, Rfl: 11 .  beclomethasone (QVAR) 80 MCG/ACT inhaler, Inhale 2 puffs into the lungs 2 (two) times daily. (Patient taking differently: Inhale 2 puffs into the lungs every morning. ), Disp: 1 Inhaler, Rfl: 6 .  diphenhydramine-acetaminophen (TYLENOL PM) 25-500 MG TABS, Take 1 tablet by mouth at bedtime as needed (pain)., Disp: , Rfl:  .  levofloxacin (LEVAQUIN) 500 MG tablet, Take 500 mg by mouth daily., Disp: , Rfl:  .  Vitamin D, Cholecalciferol, 1000 UNITS TABS, Take 1 tablet by mouth daily., Disp: , Rfl:  .  zolpidem (AMBIEN) 10 MG tablet, Take 1 tablet (10 mg total) by mouth at bedtime. (Patient taking differently: Take 5 mg by mouth at bedtime. ), Disp: 30 tablet, Rfl: 5 .  esomeprazole (NEXIUM) 40 MG capsule, Take 1 capsule (40 mg total) by mouth daily. (Patient not taking: Reported on 07/27/2015), Disp: 30 capsule, Rfl: 3

## 2015-07-28 ENCOUNTER — Telehealth: Payer: Self-pay | Admitting: Internal Medicine

## 2015-07-28 MED ORDER — PANTOPRAZOLE SODIUM 40 MG PO TBEC: 40.0000 mg | DELAYED_RELEASE_TABLET | Freq: Every day | ORAL | Status: DC

## 2015-07-28 NOTE — Telephone Encounter (Signed)
Patient states that you sent Rx for Nexium in for her last week.  Her ins co-pay for that Rx is $200+.  The pharmacy advised her that Protonix would be $6 copay with her insurance.  Do you feel this would be a Rx that would work for her?  Or, is there something more affordable that would work for her?  She cannot afford the Nexium.    Please advise.    [facility]

## 2015-07-28 NOTE — Telephone Encounter (Signed)
Change to generic Protonix 40 mg daily

## 2015-07-28 NOTE — Telephone Encounter (Signed)
Prescription sent to pharmacy.

## 2015-08-10 ENCOUNTER — Encounter: Payer: Self-pay | Admitting: Internal Medicine
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
## 2015-08-10 NOTE — Patient Instructions (Addendum)
Restart Levaquin 500 milligrams daily for 10 days for recurrent respiratory infection. See pulmonary for follow-up of recurrent respiratory infections. Trial of Nexium for food sticking in throat and may need GI referral regarding food sticking in throat. To see ENT regarding left tonsil. Have mammogram and bone density study.

## 2015-08-26 ENCOUNTER — Telehealth: Payer: Self-pay | Admitting: Pulmonary Disease

## 2015-08-26 NOTE — Telephone Encounter (Signed)
Spoke with pt. States that her insurance will no longer cover Qvar. The only alternative they gave her was Flovent. Would like to have this changed.  BQ - please advise. Thanks.

## 2015-08-26 NOTE — Telephone Encounter (Signed)
OK by me 

## 2015-08-27 MED ORDER — FLUTICASONE PROPIONATE HFA 220 MCG/ACT IN AERO: 2.0000 | INHALATION_SPRAY | Freq: Two times a day (BID) | RESPIRATORY_TRACT | Status: DC

## 2015-08-27 NOTE — Telephone Encounter (Signed)
Please specify on dosing of Flovent ( HFA 110, HFA 220) and sig. Thanks.

## 2015-08-27 NOTE — Telephone Encounter (Signed)
Called spoke with pt. Aware of recs below. RX has been called into [facility]. Nothing further needed

## 2015-08-27 NOTE — Telephone Encounter (Signed)
Patient called and states she needs this called in by tomorrow am because she is running out.  CB [phone], Pharmacy is [facility] [address].

## 2015-08-27 NOTE — Telephone Encounter (Signed)
220 2 puff bid

## 2015-09-10 ENCOUNTER — Ambulatory Visit (INDEPENDENT_AMBULATORY_CARE_PROVIDER_SITE_OTHER): Payer: Medicare Other | Admitting: Pulmonary Disease

## 2015-09-10 ENCOUNTER — Encounter: Payer: Self-pay | Admitting: Pulmonary Disease

## 2015-09-10 VITALS — BP 124/68 | HR 74 | Ht 63.0 in | Wt 140.0 lb

## 2015-09-10 DIAGNOSIS — J4520 Mild intermittent asthma, uncomplicated: Secondary | ICD-10-CM

## 2015-09-10 NOTE — Patient Instructions (Signed)
Continue taking Flovent as you are doing Ask Dr. Renold Genta about your pneumonia vaccine record We will see you back in 6 months

## 2015-09-10 NOTE — Progress Notes (Signed)
Please check and see if pt has had Prevnar. If not, call her and schedule for second week of February

## 2015-09-10 NOTE — Progress Notes (Signed)
Subjective:    Patient ID: Mary Garcia, female    DOB: February 06, 1946, 70 y.o.   MRN: [id number]  Synopsis: Former patient of Dr. Gwenette Garcia who has asthma Dr. Gwenette Garcia summarized her clinical situation as follows: PFT's 11/2014: FEV1 1.73 (76%), ratio 71, 17% response to BD. No restriction, and DLCO is normal  Pt not overly symptomatic, except acute asthmatic bronchitis every time she got URI She smoked briefly in college.   No family history of lung disease Mother was diagnosed with asthma in adulthood  HPI Chief Complaint  Patient presents with  . Follow-up    pt states she is doing well.  insurance no longer paying for qvar, now on flovent.  tolerating well.    Mary Garcia has been doing well.  She was taking QVar 2 puffs four times a day and it was really working well.  She has only needed albuterol 3-4 times since the last visit. This has been the best she has felt in 2.5 years.  No recent dyspnea.  She heven had a cold about a month ago and didn't have any problems with her asthma.  Past Medical History  Diagnosis Date  . Bronchitis, acute   . Anxiety   . Arthritis       Review of Systems     Objective:   Physical Exam Filed Vitals:   09/10/15 1341  BP: 124/68  Pulse: 74  Height: 5\' 3"  (1.6 m)  Weight: 140 lb (63.504 kg)  SpO2: 98%   RA  Gen: well appearing, no acute distress HENT: NCAT, OP clear, neck supple without masses Eyes: PERRL, EOMi Lymph: no cervical lymphadenopathy PULM: CTA B CV: RRR, no mgr, no JVD GI: BS+, soft, nontender, no hsm Derm: no rash or skin breakdown MSK: normal bulk and tone Neuro: A&Ox4, CN II-XII intact, strength 5/5 in all 4 extremities Psyche: normal mood and affect  Chest [facility] images from [facility] personally reviewed showing air-trapping but no pulmonary parenchymal process. Lab work from her primary care physician's office reviewed, normal white blood cell count, normal differential       Assessment & Plan:  Asthma This has  been a stable interval for Mary Garcia, she is doing very well on the Flovent at a moderate dose.  We know that she is well controlled on high dose QVar but she is doing well on the Flovent for now.  Plan: Continue Flovent for now, if she has a flare up then we will appeal to her insurance company Consider decreasing dose if stable in 12 months Check with Dr. Renold Garcia re pneumovaccine F/u 6 months     Current outpatient prescriptions:  .  albuterol (PROVENTIL HFA;VENTOLIN HFA) 108 (90 BASE) MCG/ACT inhaler, Inhale 2 puffs into the lungs every 6 (six) hours as needed for wheezing or shortness of breath., Disp: 1 Inhaler, Rfl: 11 .  diphenhydramine-acetaminophen (TYLENOL PM) 25-500 MG TABS, Take 1 tablet by mouth at bedtime as needed (pain)., Disp: , Rfl:  .  fluticasone (FLOVENT HFA) 220 MCG/ACT inhaler, Inhale 2 puffs into the lungs 2 (two) times daily., Disp: 1 Inhaler, Rfl: 6 .  pantoprazole (PROTONIX) 40 MG tablet, Take 1 tablet (40 mg total) by mouth daily., Disp: 30 tablet, Rfl: 3 .  Vitamin D, Cholecalciferol, 1000 UNITS TABS, Take 1 tablet by mouth daily., Disp: , Rfl:  .  zolpidem (AMBIEN) 10 MG tablet, Take 1 tablet (10 mg total) by mouth at bedtime. (Patient taking differently: Take 5 mg by mouth at  bedtime. ), Disp: 30 tablet, Rfl: 5

## 2015-09-10 NOTE — Assessment & Plan Note (Signed)
This has been a stable interval for Mary Garcia, she is doing very well on the Flovent at a moderate dose.  We know that she is well controlled on high dose QVar but she is doing well on the Flovent for now.  Plan: Continue Flovent for now, if she has a flare up then we will appeal to her insurance company Consider decreasing dose if stable in 12 months Check with Dr. Renold Genta re pneumovaccine F/u 6 months

## 2015-09-11 ENCOUNTER — Telehealth: Payer: Self-pay | Admitting: Internal Medicine

## 2015-09-11 NOTE — Telephone Encounter (Signed)
LM for return call; We do not have record of her ever receiving a pneumonia shot. If she thinks she has had one we need to find out from where so that we can document it in her chart. We will schedule for second week in feb for her to have the prevnar

## 2015-09-11 NOTE — Telephone Encounter (Signed)
Patient did not know there are two Pneumonia injections and she believes she has only had one of them.  Dr. She saw Dr. Lake Bells and he would like for her to have both injections.  Please call and let her know if she needs to come in for the 2nd injection.

## 2015-09-14 NOTE — Telephone Encounter (Signed)
Left message for return call.

## 2015-09-30 ENCOUNTER — Telehealth: Payer: Self-pay | Admitting: Pulmonary Disease

## 2015-09-30 ENCOUNTER — Ambulatory Visit: Payer: Medicare Other | Admitting: Gastroenterology

## 2015-09-30 NOTE — Telephone Encounter (Signed)
Left voicemail for return call  

## 2015-09-30 NOTE — Telephone Encounter (Signed)
Would prefer she get prevnar 13 first

## 2015-09-30 NOTE — Telephone Encounter (Signed)
Called spoke with pt. She reports she spoke with Dr. Roderic Palau office and she has never had a PNA vaccine at all.  She was told she was told by Dr. Verlene Mayer office that is she got the PNA 23 then she would have to wait 1 year to get prevnar 13. Please advise Dr. Lake Bells thanks

## 2015-09-30 NOTE — Telephone Encounter (Signed)
Spoke with patient and advised her that we do not have record of her ever receiving the vaccine. She was advised to contact [place] her previous PCP as well as Development worker, community to see if they have record of it. She was advised that if they do, they need to fax it to our office for documentation purposes.

## 2015-10-01 NOTE — Telephone Encounter (Signed)
lmtcb X1 for pt to make aware.

## 2015-10-02 ENCOUNTER — Ambulatory Visit (INDEPENDENT_AMBULATORY_CARE_PROVIDER_SITE_OTHER): Payer: Medicare Other

## 2015-10-02 DIAGNOSIS — Z23 Encounter for immunization: Secondary | ICD-10-CM | POA: Diagnosis not present

## 2015-10-02 DIAGNOSIS — Z8709 Personal history of other diseases of the respiratory system: Secondary | ICD-10-CM | POA: Diagnosis not present

## 2015-10-02 NOTE — Telephone Encounter (Signed)
lmtcb X2 for pt.  

## 2015-10-02 NOTE — Telephone Encounter (Signed)
[phone], pt cb

## 2015-10-02 NOTE — Telephone Encounter (Signed)
Pt is aware of BQ's response. Appointment has been made for pt to get to prevnar. Nothing further was needed.

## 2015-11-30 ENCOUNTER — Ambulatory Visit: Payer: Medicare Other | Admitting: Gastroenterology

## 2015-12-06 ENCOUNTER — Other Ambulatory Visit: Payer: Self-pay | Admitting: Internal Medicine

## 2016-01-01 ENCOUNTER — Other Ambulatory Visit: Payer: Self-pay

## 2016-01-01 MED ORDER — ZOLPIDEM TARTRATE 10 MG PO TABS: 10.0000 mg | ORAL_TABLET | Freq: Every day | ORAL | Status: DC

## 2016-01-01 MED ORDER — ALPRAZOLAM 0.25 MG PO TABS: 0.2500 mg | ORAL_TABLET | Freq: Once | ORAL | Status: DC

## 2016-01-01 NOTE — Telephone Encounter (Signed)
Patient states that she needs a refill on her alprazolam 0.25mg  that she doesn't take very often. She states that she hasn't had a script filled since May of 2016 as she hasn't needed it but needs a refill now.

## 2016-01-01 NOTE — Telephone Encounter (Signed)
Is going to need q 6 month OV if stays on Xanax Book 6 months from last CPE and refill until then. Need to explain to pt.

## 2016-01-01 NOTE — Telephone Encounter (Signed)
Medications sent to pharmacy- patient advised of Q89m appt and appt made for 6/20

## 2016-02-02 ENCOUNTER — Ambulatory Visit (INDEPENDENT_AMBULATORY_CARE_PROVIDER_SITE_OTHER): Payer: Medicare Other | Admitting: Internal Medicine

## 2016-02-02 ENCOUNTER — Encounter: Payer: Self-pay | Admitting: Internal Medicine

## 2016-02-02 ENCOUNTER — Other Ambulatory Visit: Payer: Self-pay | Admitting: Pulmonary Disease

## 2016-02-02 VITALS — BP 140/86 | HR 70 | Temp 98.2°F | Resp 18 | Wt 137.0 lb

## 2016-02-02 DIAGNOSIS — G4700 Insomnia, unspecified: Secondary | ICD-10-CM

## 2016-02-02 DIAGNOSIS — F329 Major depressive disorder, single episode, unspecified: Secondary | ICD-10-CM

## 2016-02-02 DIAGNOSIS — M12819 Other specific arthropathies, not elsewhere classified, unspecified shoulder: Secondary | ICD-10-CM

## 2016-02-02 DIAGNOSIS — F5104 Psychophysiologic insomnia: Secondary | ICD-10-CM

## 2016-02-02 DIAGNOSIS — M19011 Primary osteoarthritis, right shoulder: Secondary | ICD-10-CM | POA: Insufficient documentation

## 2016-02-02 DIAGNOSIS — F32A Depression, unspecified: Secondary | ICD-10-CM | POA: Insufficient documentation

## 2016-02-02 MED ORDER — ZOLPIDEM TARTRATE 10 MG PO TABS: 10.0000 mg | ORAL_TABLET | Freq: Every day | ORAL | Status: DC

## 2016-02-02 MED ORDER — HYDROCODONE-ACETAMINOPHEN 5-325 MG PO TABS: ORAL_TABLET | ORAL | Status: DC

## 2016-02-02 MED ORDER — ESCITALOPRAM OXALATE 10 MG PO TABS: 10.0000 mg | ORAL_TABLET | Freq: Every day | ORAL | Status: DC

## 2016-02-02 NOTE — Patient Instructions (Signed)
Change tramadol to hydrocodone/APAP 5/325 at bedtime. Ambien refilled. Start Lexapro 10 mg daily and call with progress report in 4 weeks.

## 2016-02-02 NOTE — Progress Notes (Signed)
   Subjective:    Patient ID: Mary Garcia, female    DOB: 1946-01-01, 70 y.o.   MRN: [id number]  HPI 70 year old Female who has chronic insomnia in today for six-month recheck on insomnia. Needs Ambien refill. She takes it every night. She's been having considerable right shoulder pain. She saw Dr. Theda Sers' PA. An MRI was done and she apparently has some internal derangement of her right shoulder and may need surgery. She is to see orthopedist soon. She's been taking tramadol at night for pain. She think she is depressed. She may have to cancel her bicycling trip to [place] in August. She think she may have to have surgery on her shoulder in August instead. Husband has been diagnosed with prostate cancer. She would like to try an antidepressant. However with Lexapro there is an interaction with tramadol. Given her some hydrocodone/APAP 5/325 to take at bedtime instead of tramadol. Have refilled her Ambien for 6 months. Started her on Lexapro 10 mg daily for mild depression. She'll let me know how things are going in 4 weeks.     Review of Systems     Objective:   Physical Exam Not examined. Spent 20 minutes speaking with her about these issues.       Assessment & Plan:  Chronic insomnia-refill Ambien for 6 months  Mild depression-start Lexapro 10 mg daily. Patient: 4 weeks with progress report.  Right shoulder arthropathy-hydrocodone/APAP 5/325 (#60) 1 by mouth twice a day but mainly daily at bedtime which should last until surgery in August. Discontinue tramadol.

## 2016-02-12 ENCOUNTER — Telehealth: Payer: Self-pay

## 2016-02-12 NOTE — Telephone Encounter (Signed)
Pt contacted office stating that she had a reaction to the lexapro. She c/o lack of appetite, nausea, diarrhea, and felt really bad. She states that she lost 7 pounds last week. She states that her body did not like that medication. She spoke to a friend who is a therapist and she recommended to her zoloft. She wants to know if you think that this may be better with less side effects. She states that has since stopped the lexapro and her appetite is getting back closer to normal and the nausea and diarrhea have subsided. Please advise.

## 2016-02-12 NOTE — Telephone Encounter (Signed)
Stop Lexapro.  I can see her when I get back.  Do not want to start something new while I am out of town. Other option is psychiatric consult.

## 2016-02-12 NOTE — Telephone Encounter (Signed)
Patient notified

## 2016-03-08 ENCOUNTER — Ambulatory Visit: Payer: Medicare Other | Admitting: Pulmonary Disease

## 2016-04-13 ENCOUNTER — Encounter: Payer: Self-pay | Admitting: Pulmonary Disease

## 2016-04-13 ENCOUNTER — Ambulatory Visit (INDEPENDENT_AMBULATORY_CARE_PROVIDER_SITE_OTHER): Payer: Medicare Other | Admitting: Pulmonary Disease

## 2016-04-13 DIAGNOSIS — J4520 Mild intermittent asthma, uncomplicated: Secondary | ICD-10-CM | POA: Diagnosis not present

## 2016-04-13 DIAGNOSIS — Z23 Encounter for immunization: Secondary | ICD-10-CM

## 2016-04-13 NOTE — Progress Notes (Signed)
   Subjective:    Patient ID: Mary Garcia, female    DOB: 03/21/1946, 70 y.o.   MRN: [id number]  Synopsis: Former patient of Dr. Gwenette Greet who has asthma Dr. Gwenette Greet summarized her clinical situation as follows: PFT's 11/2014: FEV1 1.73 (76%), ratio 71, 17% response to BD. No restriction, and DLCO is normal  Pt not overly symptomatic, except acute asthmatic bronchitis every time she got URI She smoked briefly in college.   No family history of lung disease Mother was diagnosed with asthma in adulthood  HPI Chief Complaint  Patient presents with  . Follow-up    pt has had no breathing complaints, states she is concerned about fall typically being her worst season.     Hazeline had shoulder surgery 5 weeks ago on her rotator cuff.  No breathing trouble at that time. She has not had any respiratory complaints at this time.   No albuterol use in months, maybe once since January. She is concerned that the next few weeks may be rough for her as we are approaching ragweed season.  She has not had any chest heaviness, dyspnea.    Past Medical History:  Diagnosis Date  . Anxiety   . Arthritis   . Bronchitis, acute       Review of Systems     Objective:   Physical Exam Vitals:   04/13/16 1345  BP: 118/74  Pulse: (!) 103  SpO2: 98%  Weight: 134 lb 3.2 oz (60.9 kg)  Height: 5\' 3"  (1.6 m)   RA  Gen: well appearing HENT: OP clear, TM's clear, neck supple PULM: CTA B, normal percussion CV: RRR, no mgr, trace edema GI: BS+, soft, nontender Derm: no cyanosis or rash Psyche: normal mood and affect      Assessment & Plan:  Asthma This has been a stable interval her without exacerbation.  Plan: Continue Flovent at current dosing, however if she has any worsening asthma symptoms in the next few months (this is typically at that time of year for her) she knows that I have instructed her to increase the dose to 2 puffs twice a day Flu shot 6 months    Current Outpatient  Prescriptions:  .  albuterol (PROVENTIL HFA;VENTOLIN HFA) 108 (90 BASE) MCG/ACT inhaler, Inhale 2 puffs into the lungs every 6 (six) hours as needed for wheezing or shortness of breath., Disp: 1 Inhaler, Rfl: 11 .  ALPRAZolam (XANAX) 0.25 MG tablet, Take 1 tablet (0.25 mg total) by mouth once. As needed, Disp: 30 tablet, Rfl: 0 .  diphenhydramine-acetaminophen (TYLENOL PM) 25-500 MG TABS, Take 1 tablet by mouth at bedtime as needed (pain)., Disp: , Rfl:  .  FLOVENT HFA 220 MCG/ACT inhaler, INHALE 2 PUFFS INTO THE LUNGS TWICE DAILY (Patient taking differently: INHALE 1 PUFF INTO THE LUNGS TWICE DAILY), Disp: 12 g, Rfl: 5 .  pantoprazole (PROTONIX) 40 MG tablet, TAKE ONE TABLET BY MOUTH EVERY DAY, Disp: 30 tablet, Rfl: 5 .  Vitamin D, Cholecalciferol, 1000 UNITS TABS, Take 1 tablet by mouth daily., Disp: , Rfl:  .  zolpidem (AMBIEN) 10 MG tablet, Take 1 tablet (10 mg total) by mouth at bedtime., Disp: 30 tablet, Rfl: 5

## 2016-04-13 NOTE — Assessment & Plan Note (Signed)
This has been a stable interval her without exacerbation.  Plan: Continue Flovent at current dosing, however if she has any worsening asthma symptoms in the next few months (this is typically at that time of year for her) she knows that I have instructed her to increase the dose to 2 puffs twice a day Flu shot 6 months

## 2016-04-13 NOTE — Patient Instructions (Signed)
Keep taking Flovent as you are doing If you have any respiratory complaints that are consistent with your prior problems with asthma than go ahead and increase the dose of Flovent to 2 puffs twice a day Otherwise, continue on the current dose and follow-up with me in 6 months

## 2016-05-31 ENCOUNTER — Other Ambulatory Visit: Payer: Self-pay | Admitting: Internal Medicine

## 2016-06-29 ENCOUNTER — Encounter: Payer: Self-pay | Admitting: Internal Medicine

## 2016-07-05 ENCOUNTER — Ambulatory Visit (INDEPENDENT_AMBULATORY_CARE_PROVIDER_SITE_OTHER): Payer: Medicare Other | Admitting: Internal Medicine

## 2016-07-05 ENCOUNTER — Encounter: Payer: Self-pay | Admitting: Internal Medicine

## 2016-07-05 VITALS — BP 138/90 | HR 97 | Ht 63.0 in | Wt 136.2 lb

## 2016-07-05 DIAGNOSIS — J4531 Mild persistent asthma with (acute) exacerbation: Secondary | ICD-10-CM | POA: Diagnosis not present

## 2016-07-05 MED ORDER — HYDROCODONE-HOMATROPINE 5-1.5 MG/5ML PO SYRP: 5.0000 mL | ORAL_SOLUTION | Freq: Four times a day (QID) | ORAL | 0 refills | Status: DC | PRN

## 2016-07-05 MED ORDER — AZITHROMYCIN 250 MG PO TABS: ORAL_TABLET | ORAL | 0 refills | Status: DC

## 2016-07-05 MED ORDER — PREDNISONE 10 MG PO TABS: ORAL_TABLET | ORAL | 0 refills | Status: DC

## 2016-07-05 NOTE — Progress Notes (Signed)
Subjective:    Patient ID: Mary Garcia, female    DOB: July 31, 1946   MRN: [id number]  Synopsis: Former patient of Dr. Gwenette Garcia who has asthma Dr. Gwenette Garcia summarized her clinical situation as follows: PFT's 11/2014: FEV1 1.73 (76%), ratio 71, 17% response to BD. No restriction, and DLCO is normal  Pt not overly symptomatic, except acute asthmatic bronchitis  with URIs She smoked briefly in college.   No family history of lung disease Mother was diagnosed with asthma in adulthood    07/05/2016 acute extended ov/Mary Garcia re: asthma maint on flovent 220 hfa  2 bid and rarely needed saba   On ppi ac  Chief Complaint  Patient presents with  . Acute Visit    BQ pt: Pt c/o cough and congestion x 5 days. Pt states that what she was coughing up had no color. Pt c/o chest tightness and no mucus production. Pt states that she had sinus pressure and congestion and blowing yellow mucus from her head.   87 yowf  New to me minimal smoking hx acutely ill x 5 days/ sore throat, nasal congestion, using saba 4 x daily with suboptimal hfa see a/p Comfortable at rest p saba though cough continues quite harsh to point of gagging  No obvious day to day or daytime variability or assoc excess/ purulent sputum or mucus plugs or hemoptysis or cp  or subjective wheeze or overt sinus or hb symptoms. No unusual exp hx or h/o childhood pna/ asthma or knowledge of premature birth.  Sleeping ok without nocturnal  or early am exacerbation  of respiratory  c/o's or need for noct saba. Also denies any obvious fluctuation of symptoms with weather or environmental changes or other aggravating or alleviating factors except as outlined above   Current Medications, Allergies, Complete Past Medical History, Past Surgical History, Family History, and Social History were reviewed in [employer] record.  ROS  The following are not active complaints unless bolded sore throat, dysphagia, dental problems, itching,  sneezing,  nasal congestion or excess/ purulent secretions, ear ache,   fever, chills, sweats, unintended wt loss, classically pleuritic or exertional cp,  orthopnea pnd or leg swelling, presyncope, palpitations, abdominal pain, anorexia, nausea, vomiting, diarrhea  or change in bowel or bladder habits, change in stools or urine, dysuria,hematuria,  rash, arthralgias, visual complaints, headache, numbness, weakness or ataxia or problems with walking or coordination,  change in mood/affect or memory.              Objective:   Physical Exam   Wt Readings from Last 3 Encounters:  07/05/16 136 lb 3.2 oz (61.8 kg)  04/13/16 134 lb 3.2 oz (60.9 kg)  02/02/16 137 lb (62.1 kg)    Vital signs reviewed - - Note on arrival 02 sats  95% on RA    HEENT: nl dentition, turbinates, and oropharynx. Nl external ear canals without cough reflex   NECK :  without JVD/Nodes/TM/ nl carotid upstrokes bilaterally   LUNGS: no acc muscle use,  Nl contour chest with cough on insp and mostly pseudowheeze on exp    CV:  RRR  no s3 or murmur or increase in P2, no edema   ABD:  soft and nontender with nl inspiratory excursion in the supine position. No bruits or organomegaly, bowel sounds nl  MS:  Nl gait/ ext warm without deformities, calf tenderness, cyanosis or clubbing No obvious joint restrictions   SKIN: warm and dry without lesions    NEURO:  alert, approp, nl sensorium with  no motor deficits          Assessment & Plan:

## 2016-07-05 NOTE — Patient Instructions (Addendum)
When you have a flare of cough,  Increase protonix or take prilosec to Take 30- 60 min before your first and last meals of the day and resume previous dose when you stop coughing   Work on inhaler technique:  relax and gently blow all the way out then take a nice smooth deep breath back in, triggering the inhaler at same time you start breathing in.  Hold for up to 5 seconds if you can. Blow out thru nose. Rinse and gargle with water when done     Prednisone 10 mg take  4 each am x 2 days,   2 each am x 2 days,  1 each am x 2 days and stop   zpak   Only use your albuterol (proair) s a rescue medication to be used if you can't catch your breath by resting or doing a relaxed purse lip breathing pattern but it may also work for cough  - The less you use it, the better it will work when you need it. - Ok to use up to 2 puffs  every 4 hours if you must but call for immediate appointment if use goes up over your usual need - Don't leave home without it !!  (think of it like the spare tire for your car)   hydromet cough syrup up to 1-2 tsp every 4 hours as needed   See Dr Lake Bells in Feb 2018 - call sooner if needed

## 2016-07-07 ENCOUNTER — Encounter: Payer: Self-pay | Admitting: Internal Medicine

## 2016-07-07 DIAGNOSIS — J45901 Unspecified asthma with (acute) exacerbation: Secondary | ICD-10-CM | POA: Insufficient documentation

## 2016-07-07 NOTE — Assessment & Plan Note (Signed)
PFT's 11/2014: FEV1 1.73 (76%), ratio 71, 17% response to BD. No restriction, and DLCO is normal  - 07/05/2016  After extensive coaching HFA effectiveness =    75%   Interesting hx of severe flares of cough > sob attribute to uri/asthma though the cough is more impressive than then wheeze  Of the three most common causes of chronic cough, only one (GERD)  can actually cause the other two (asthma and post nasal drip syndrome)  and perpetuate the cylce of cough inducing airway trauma, inflammation, heightened sensitivity to reflux which is prompted by the cough itself via a cyclical mechanism.    This may partially respond to steroids and look like asthma and post nasal drainage but never erradicated completely unless the cough and the secondary reflux are eliminated, preferably both at the same time.  While not intuitively obvious, many patients with chronic low grade reflux do not cough until there is a secondary insult that disturbs the protective epithelial barrier and exposes sensitive nerve endings.  This can be viral or direct physical injury such as with an endotracheal tube.   The point is that once this occurs, it is difficult to eliminate using anything but a maximally effective acid suppression regimen at least in the short run, accompanied by an appropriate diet to address non acid GERD.   rec zpak/pred x 6 and max rx for gerd until better then resume previous maint rx  I had an extended discussion with the patient reviewing all relevant studies completed to date and  lasting 25 minutes of a 40  minute acute  visit  re  non-specific but potentially very serious pulmonary symptoms of unknown etiology in an pt unknown to me previously   Each maintenance medication was reviewed in detail including most importantly the difference between maintenance and prns and under what circumstances the prns are to be triggered using an action plan format that is not reflected in the computer generated  alphabetically organized AVS.    Please see instructions for details which were reviewed in writing and the patient given a copy highlighting the part that I personally wrote and discussed at today's ov.

## 2016-07-19 ENCOUNTER — Other Ambulatory Visit: Payer: Medicare Other | Admitting: Internal Medicine

## 2016-07-19 DIAGNOSIS — Z130 Encounter for screening for diseases of the blood and blood-forming organs and certain disorders involving the immune mechanism: Secondary | ICD-10-CM

## 2016-07-19 DIAGNOSIS — Z1329 Encounter for screening for other suspected endocrine disorder: Secondary | ICD-10-CM

## 2016-07-19 DIAGNOSIS — E559 Vitamin D deficiency, unspecified: Secondary | ICD-10-CM

## 2016-07-19 DIAGNOSIS — Z0000 Encounter for general adult medical examination without abnormal findings: Secondary | ICD-10-CM

## 2016-07-19 DIAGNOSIS — Z13220 Encounter for screening for lipoid disorders: Secondary | ICD-10-CM

## 2016-07-19 LAB — CBC WITH DIFFERENTIAL/PLATELET
BASOS ABS: 0 {cells}/uL (ref 0–200)
BASOS PCT: 0 %
EOS ABS: 104 {cells}/uL (ref 15–500)
Eosinophils Relative: 2 %
HEMATOCRIT: 43 % (ref 35.0–45.0)
Hemoglobin: 13.6 g/dL (ref 11.7–15.5)
LYMPHS PCT: 22 %
Lymphs Abs: 1144 cells/uL (ref 850–3900)
MCH: 28.3 pg (ref 27.0–33.0)
MCHC: 31.6 g/dL — ABNORMAL LOW (ref 32.0–36.0)
MCV: 89.4 fL (ref 80.0–100.0)
MONO ABS: 416 {cells}/uL (ref 200–950)
MONOS PCT: 8 %
MPV: 9.4 fL (ref 7.5–12.5)
Neutro Abs: 3536 cells/uL (ref 1500–7800)
Neutrophils Relative %: 68 %
Platelets: 340 10*3/uL (ref 140–400)
RBC: 4.81 MIL/uL (ref 3.80–5.10)
RDW: 14.4 % (ref 11.0–15.0)
WBC: 5.2 10*3/uL (ref 3.8–10.8)

## 2016-07-19 LAB — LIPID PANEL
CHOLESTEROL: 229 mg/dL — AB (ref <200)
HDL: 98 mg/dL (ref >50)
LDL Cholesterol: 112 mg/dL — ABNORMAL HIGH (ref <100)
TRIGLYCERIDES: 93 mg/dL (ref <150)
Total CHOL/HDL Ratio: 2.3 Ratio (ref <5.0)
VLDL: 19 mg/dL (ref <30)

## 2016-07-19 LAB — COMPLETE METABOLIC PANEL WITH GFR
ALT: 17 U/L (ref 6–29)
AST: 21 U/L (ref 10–35)
Albumin: 3.9 g/dL (ref 3.6–5.1)
Alkaline Phosphatase: 59 U/L (ref 33–130)
BILIRUBIN TOTAL: 0.5 mg/dL (ref 0.2–1.2)
BUN: 17 mg/dL (ref 7–25)
CALCIUM: 9.2 mg/dL (ref 8.6–10.4)
CO2: 26 mmol/L (ref 20–31)
CREATININE: 0.7 mg/dL (ref 0.50–0.99)
Chloride: 106 mmol/L (ref 98–110)
GFR, EST NON AFRICAN AMERICAN: 89 mL/min (ref >=60)
Glucose, Bld: 85 mg/dL (ref 65–99)
Potassium: 4.4 mmol/L (ref 3.5–5.3)
Sodium: 141 mmol/L (ref 135–146)
TOTAL PROTEIN: 6 g/dL — AB (ref 6.1–8.1)

## 2016-07-19 LAB — TSH: TSH: 2.08 m[IU]/L

## 2016-07-20 ENCOUNTER — Other Ambulatory Visit: Payer: Self-pay | Admitting: Internal Medicine

## 2016-07-20 LAB — VITAMIN D 25 HYDROXY (VIT D DEFICIENCY, FRACTURES): Vit D, 25-Hydroxy: 46 ng/mL (ref 30–100)

## 2016-07-20 NOTE — Telephone Encounter (Signed)
Has upcoming CPE. Please refill x 90 days

## 2016-07-21 ENCOUNTER — Ambulatory Visit (INDEPENDENT_AMBULATORY_CARE_PROVIDER_SITE_OTHER): Payer: Medicare Other | Admitting: Internal Medicine

## 2016-07-21 ENCOUNTER — Encounter: Payer: Self-pay | Admitting: Internal Medicine

## 2016-07-21 VITALS — BP 118/82 | HR 82 | Temp 97.0°F | Ht 63.0 in | Wt 137.0 lb

## 2016-07-21 DIAGNOSIS — K219 Gastro-esophageal reflux disease without esophagitis: Secondary | ICD-10-CM | POA: Diagnosis not present

## 2016-07-21 DIAGNOSIS — Z87891 Personal history of nicotine dependence: Secondary | ICD-10-CM

## 2016-07-21 DIAGNOSIS — M8580 Other specified disorders of bone density and structure, unspecified site: Secondary | ICD-10-CM

## 2016-07-21 DIAGNOSIS — G4700 Insomnia, unspecified: Secondary | ICD-10-CM

## 2016-07-21 DIAGNOSIS — K580 Irritable bowel syndrome with diarrhea: Secondary | ICD-10-CM | POA: Diagnosis not present

## 2016-07-21 DIAGNOSIS — F5101 Primary insomnia: Secondary | ICD-10-CM | POA: Diagnosis not present

## 2016-07-21 DIAGNOSIS — F3289 Other specified depressive episodes: Secondary | ICD-10-CM | POA: Diagnosis not present

## 2016-07-21 DIAGNOSIS — Z9882 Breast implant status: Secondary | ICD-10-CM | POA: Diagnosis not present

## 2016-07-21 DIAGNOSIS — Z8709 Personal history of other diseases of the respiratory system: Secondary | ICD-10-CM

## 2016-07-21 DIAGNOSIS — M791 Myalgia: Secondary | ICD-10-CM | POA: Diagnosis not present

## 2016-07-21 DIAGNOSIS — Z0000 Encounter for general adult medical examination without abnormal findings: Secondary | ICD-10-CM | POA: Diagnosis not present

## 2016-07-21 DIAGNOSIS — F411 Generalized anxiety disorder: Secondary | ICD-10-CM

## 2016-07-21 DIAGNOSIS — Z8639 Personal history of other endocrine, nutritional and metabolic disease: Secondary | ICD-10-CM | POA: Diagnosis not present

## 2016-07-21 DIAGNOSIS — M7918 Myalgia, other site: Secondary | ICD-10-CM

## 2016-07-21 LAB — POCT URINALYSIS DIPSTICK
Bilirubin, UA: NEGATIVE
GLUCOSE UA: NEGATIVE
Ketones, UA: NEGATIVE
Leukocytes, UA: NEGATIVE
NITRITE UA: NEGATIVE
PH UA: 6
Protein, UA: NEGATIVE
RBC UA: NEGATIVE
Spec Grav, UA: 1.01
UROBILINOGEN UA: NEGATIVE

## 2016-07-21 MED ORDER — ZOLPIDEM TARTRATE 10 MG PO TABS: 10.0000 mg | ORAL_TABLET | Freq: Every day | ORAL | 0 refills | Status: DC

## 2016-07-21 MED ORDER — ALPRAZOLAM 0.25 MG PO TABS: 0.2500 mg | ORAL_TABLET | Freq: Once | ORAL | 0 refills | Status: AC
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
## 2016-08-14 NOTE — Patient Instructions (Signed)
Medications refill. Continue same medications and return in one year or as needed. Please have another bone density study. Annual mammogram recommended.

## 2016-09-14 ENCOUNTER — Ambulatory Visit (INDEPENDENT_AMBULATORY_CARE_PROVIDER_SITE_OTHER): Payer: PPO | Admitting: Adult Health

## 2016-09-14 ENCOUNTER — Encounter: Payer: Self-pay | Admitting: Adult Health

## 2016-09-14 ENCOUNTER — Ambulatory Visit (INDEPENDENT_AMBULATORY_CARE_PROVIDER_SITE_OTHER)
Admission: RE | Admit: 2016-09-14 | Discharge: 2016-09-14 | Disposition: A | Discharge status: Home / Self Care | Payer: PPO | Source: Ambulatory Visit | Attending: Adult Health | Admitting: Adult Health

## 2016-09-14 VITALS — BP 142/82 | HR 68 | Temp 97.7°F | Ht 63.0 in | Wt 141.2 lb

## 2016-09-14 DIAGNOSIS — R059 Cough, unspecified: Secondary | ICD-10-CM

## 2016-09-14 DIAGNOSIS — J4531 Mild persistent asthma with (acute) exacerbation: Secondary | ICD-10-CM | POA: Diagnosis not present

## 2016-09-14 DIAGNOSIS — R05 Cough: Secondary | ICD-10-CM

## 2016-09-14 DIAGNOSIS — R0602 Shortness of breath: Secondary | ICD-10-CM | POA: Diagnosis not present

## 2016-09-14 MED ORDER — CEFDINIR 300 MG PO CAPS: 300.0000 mg | ORAL_CAPSULE | Freq: Two times a day (BID) | ORAL | 0 refills | Status: DC

## 2016-09-14 MED ORDER — HYDROCODONE-HOMATROPINE 5-1.5 MG/5ML PO SYRP: 5.0000 mL | ORAL_SOLUTION | Freq: Four times a day (QID) | ORAL | 0 refills | Status: DC | PRN

## 2016-09-14 MED ORDER — PREDNISONE 10 MG PO TABS: ORAL_TABLET | ORAL | 0 refills | Status: DC

## 2016-09-14 NOTE — Patient Instructions (Signed)
Omnicef 300mg  Twice daily  For 7 days  Mucinex DM Twice daily As needed  Cough/congestion  Zyrtec 10mg  At bedtime  As needed  Drainage . Fluids and rest  Chest xray today .  Follow up Dr. Sherrin Daisy in 3 months and As needed   Please contact office for sooner follow up if symptoms do not improve or worsen or seek emergency care

## 2016-09-14 NOTE — Assessment & Plan Note (Signed)
Asthmatic Bronchitic exacerbation  Check cxr   Plan  Patient Instructions  Omnicef 300mg  Twice daily  For 7 days  Mucinex DM Twice daily As needed  Cough/congestion  Zyrtec 10mg  At bedtime  As needed  Drainage . Fluids and rest  Chest xray today .  Follow up Dr. Sherrin Daisy in 3 months and As needed   Please contact office for sooner follow up if symptoms do not improve or worsen or seek emergency care

## 2016-09-14 NOTE — Progress Notes (Signed)
@Patient  ID: Mary Garcia, female    DOB: [birth year], 71 y.o.   MRN: [id number]  Chief Complaint  Patient presents with  . Acute Visit    cough     Referring provider: Elby Showers, MD  HPI: 71 year old female, remote smoking, followed for asthma  TEST PFT's 11/2014: FEV1 1.73 (76%), ratio 71, 17% response to BD. No restriction, and DLCO is normal    09/14/2016 Acute OV :  Patient presents for an acute office visit. She complains over the last 2 weeks of cough, congestion, sinus drainage, wheezing, and intermittent discolored mucus. She has taken over-the-counter cold medicines without much relief. She is followed for asthma is on Flovent twice daily. She says that her grandchildren have been sick. She denies any fevers. Has had some intermittent aches. She denies any recent travel. Last antibiotic was in November. Cough is keeping her up at night  No Known Allergies  Immunization History  Administered Date(s) Administered  . Influenza,inj,Quad PF,36+ Mos 04/23/2013, 07/03/2014, 04/27/2015, 04/13/2016  . Pneumococcal Conjugate-13 10/02/2015  . Tdap 12/28/2002, 04/23/2013  . Zoster 01/27/2010    Past Medical History:  Diagnosis Date  . Anxiety   . Arthritis   . Bronchitis, acute     Tobacco History: History  Smoking Status  . Former Smoker  . Packs/day: 0.40  . Years: 15.00  . Types: Cigarettes  . Quit date: 08/15/1978  Smokeless Tobacco  . Never Used    Comment: smokes 5 cigs daily when smoking   Counseling given: Not Answered   Outpatient Encounter Prescriptions as of 09/14/2016  Medication Sig  . albuterol (PROVENTIL HFA;VENTOLIN HFA) 108 (90 BASE) MCG/ACT inhaler Inhale 2 puffs into the lungs every 6 (six) hours as needed for wheezing or shortness of breath.  . diphenhydramine-acetaminophen (TYLENOL PM) 25-500 MG TABS Take 1 tablet by mouth at bedtime as needed (pain).  Marland Kitchen ELDERBERRY PO Take by mouth.  . FLOVENT HFA 220 MCG/ACT inhaler INHALE 2 PUFFS INTO  THE LUNGS TWICE DAILY (Patient taking differently: INHALE 1 PUFF INTO THE LUNGS TWICE DAILY)  . MELATONIN PO Take by mouth.  . pantoprazole (PROTONIX) 40 MG tablet TAKE ONE TABLET BY MOUTH EVERY DAY  . Vitamin D, Cholecalciferol, 1000 UNITS TABS Take 1 tablet by mouth daily.  Marland Kitchen ALPRAZolam (XANAX) 0.25 MG tablet TK 1 T PO ONCE DAILY PRN  . cefdinir (OMNICEF) 300 MG capsule Take 1 capsule (300 mg total) by mouth 2 (two) times daily.  Marland Kitchen HYDROcodone-homatropine (HYCODAN) 5-1.5 MG/5ML syrup Take 5 mLs by mouth every 6 (six) hours as needed for cough.  . predniSONE (DELTASONE) 10 MG tablet 4 tabs for 2 days, then 3 tabs for 2 days, 2 tabs for 2 days, then 1 tab for 2 days, then stop  . [DISCONTINUED] HYDROcodone-homatropine (HYCODAN) 5-1.5 MG/5ML syrup Take 5 mLs by mouth every 6 (six) hours as needed for cough. (Patient not taking: Reported on 07/21/2016)  . [DISCONTINUED] zolpidem (AMBIEN) 10 MG tablet Take 1 tablet (10 mg total) by mouth at bedtime. (Patient not taking: Reported on 09/14/2016)   No facility-administered encounter medications on file as of 09/14/2016.      Review of Systems  Constitutional:   No  weight loss, night sweats,  Fevers, chills, fatigue, or  lassitude.  HEENT:   No headaches,  Difficulty swallowing,  Tooth/dental problems, or  Sore throat,                No sneezing, itching, ear ache,  +  nasal congestion, post nasal drip,   CV:  No chest pain,  Orthopnea, PND, swelling in lower extremities, anasarca, dizziness, palpitations, syncope.   GI  No heartburn, indigestion, abdominal pain, nausea, vomiting, diarrhea, change in bowel habits, loss of appetite, bloody stools.   Resp:  .  No chest wall deformity  Skin: no rash or lesions.  GU: no dysuria, change in color of urine, no urgency or frequency.  No flank pain, no hematuria   MS:  No joint pain or swelling.  No decreased range of motion.  No back pain.    Physical Exam  BP (!) 142/82 (BP Location: Left Arm,  Cuff Size: Normal)   Pulse 68   Temp 97.7 F (36.5 C) (Oral)   Ht 5\' 3"  (1.6 m)   Wt 141 lb 3.2 oz (64 kg)   SpO2 96%   BMI 25.01 kg/m   GEN: A/Ox3; pleasant , NAD    HEENT:  /AT,  EACs-clear, TMs-wnl, NOSE-clear drainage , THROAT-clear, no lesions, no postnasal drip or exudate noted.   NECK:  Supple w/ fair ROM; no JVD; normal carotid impulses w/o bruits; no thyromegaly or nodules palpated; no lymphadenopathy.    RESP  Few scattered rhonchi  no accessory muscle use, no dullness to percussion  CARD:  RRR, no m/r/g, no peripheral edema, pulses intact, no cyanosis or clubbing.  GI:   Soft & nt; nml bowel sounds; no organomegaly or masses detected.   Musco: Warm bil, no deformities or joint swelling noted.   Neuro: alert, no focal deficits noted.    Skin: Warm, no lesions or rashes  Psych:  No change in mood or affect. No depression or anxiety.  No memory loss.  Lab Results:  CBC    Component Value Date/Time   WBC 5.2 07/19/2016 1100   RBC 4.81 07/19/2016 1100   HGB 13.6 07/19/2016 1100   HCT 43.0 07/19/2016 1100   PLT 340 07/19/2016 1100   MCV 89.4 07/19/2016 1100   MCH 28.3 07/19/2016 1100   MCHC 31.6 (L) 07/19/2016 1100   RDW 14.4 07/19/2016 1100   LYMPHSABS 1,144 07/19/2016 1100   MONOABS 416 07/19/2016 1100   EOSABS 104 07/19/2016 1100   BASOSABS 0 07/19/2016 1100    BMET    Component Value Date/Time   NA 141 07/19/2016 1100   K 4.4 07/19/2016 1100   CL 106 07/19/2016 1100   CO2 26 07/19/2016 1100   GLUCOSE 85 07/19/2016 1100   BUN 17 07/19/2016 1100   CREATININE 0.70 07/19/2016 1100   CALCIUM 9.2 07/19/2016 1100   GFRNONAA 89 07/19/2016 1100   GFRAA >89 07/19/2016 1100    BNP No results found for: BNP  ProBNP No results found for: PROBNP  Imaging: No results found.   Assessment & Plan:   Asthma with acute exacerbation Asthmatic Bronchitic exacerbation  Check cxr   Plan  Patient Instructions  Omnicef 300mg  Twice daily  For 7 days    Mucinex DM Twice daily As needed  Cough/congestion  Zyrtec 10mg  At bedtime  As needed  Drainage . Fluids and rest  Chest xray today .  Follow up Dr. Sherrin Daisy in 3 months and As needed   Please contact office for sooner follow up if symptoms do not improve or worsen or seek emergency care         Rexene Edison, NP 09/14/2016

## 2016-09-15 NOTE — Progress Notes (Signed)
Called spoke with patient, advised of cxr results / recs as stated by TP.  Pt verbalized her understanding and denied any questions. 

## 2016-09-19 NOTE — Progress Notes (Signed)
Reviewed, agree 

## 2016-09-21 ENCOUNTER — Telehealth: Payer: Self-pay | Admitting: Pulmonary Disease

## 2016-09-21 MED ORDER — PREDNISONE 10 MG PO TABS: ORAL_TABLET | ORAL | 0 refills | Status: DC

## 2016-09-21 MED ORDER — CEFDINIR 300 MG PO CAPS: 300.0000 mg | ORAL_CAPSULE | Freq: Two times a day (BID) | ORAL | 0 refills | Status: DC

## 2016-09-21 NOTE — Telephone Encounter (Signed)
Pt aware of TP's recommendations. Rx sent to preferred pharmacy. Nothing further needed.

## 2016-09-21 NOTE — Telephone Encounter (Signed)
CXR w/ no acute process.  Extend Omnicef 300mg  Twice daily x 3 d, #6.  Prednisone 10mg  #20. 4 tabs for 2 days, then 3 tabs for 2 days, 2 tabs for 2 days, then 1 tab for 2 days, then stop No refills Please contact office for sooner follow up if symptoms do not improve or worsen or seek emergency care  Cont w/ ov recs Please contact office for sooner follow up if symptoms do not improve or worsen or seek emergency care

## 2016-09-21 NOTE — Telephone Encounter (Signed)
Spoke with pt. She is not feeling any better since being seen on 09/14/16. Still reports cough and wheezing. Cough is producing green mucus. Denies chest tightness, SOB or fever. Finished Omnicef yesterday. Pt would like TP's recommendations.  TP - please advise. Thanks.

## 2016-09-26 DIAGNOSIS — Z4789 Encounter for other orthopedic aftercare: Secondary | ICD-10-CM | POA: Diagnosis not present

## 2016-09-26 DIAGNOSIS — S46011D Strain of muscle(s) and tendon(s) of the rotator cuff of right shoulder, subsequent encounter: Secondary | ICD-10-CM | POA: Diagnosis not present

## 2016-10-05 ENCOUNTER — Ambulatory Visit (INDEPENDENT_AMBULATORY_CARE_PROVIDER_SITE_OTHER): Payer: PPO | Admitting: Pulmonary Disease

## 2016-10-05 ENCOUNTER — Encounter: Payer: Self-pay | Admitting: Pulmonary Disease

## 2016-10-05 VITALS — BP 124/62 | HR 89 | Ht 63.0 in | Wt 141.0 lb

## 2016-10-05 DIAGNOSIS — J45909 Unspecified asthma, uncomplicated: Secondary | ICD-10-CM

## 2016-10-05 LAB — NITRIC OXIDE: Nitric Oxide: 16

## 2016-10-05 MED ORDER — ALBUTEROL SULFATE HFA 108 (90 BASE) MCG/ACT IN AERS
2.0000 | INHALATION_SPRAY | Freq: Four times a day (QID) | RESPIRATORY_TRACT | 11 refills | Status: DC | PRN
Start: 2016-10-05 — End: 2018-02-26

## 2016-10-05 MED ORDER — BENZONATATE 200 MG PO CAPS: 200.0000 mg | ORAL_CAPSULE | Freq: Three times a day (TID) | ORAL | 1 refills | Status: DC | PRN

## 2016-10-05 NOTE — Patient Instructions (Signed)
For your cough: You need to try to suppress your cough to allow your larynx (voice box) to heal.  For three days don't talk, laugh, sing, or clear your throat. Do everything you can to suppress the cough during this time. Use hard candies (sugarless Jolly Ranchers) or non-mint or non-menthol containing cough drops during this time to soothe your throat.  Use a cough suppressant (Delsym or what I have prescribed you) around the clock during this time.  After three days, gradually increase the use of your voice and back off on the cough suppressants.  If this doesn't help your cough, let me know and I can change your prescription to Advair  Take a Zyrtec daily.  We will see you back in 6 months or sooner if needed

## 2016-10-05 NOTE — Assessment & Plan Note (Signed)
Mary Garcia had 2 episodes of bronchitis over the winter which I think were just viral, but she's been having increasing cough and some dyspnea in the evenings. So overall and concerned that her asthma control has been less well controlled recently.  Rotating matters her albuterol inhaler has not been working appropriately.  Today her excelled nitric oxide test was 16, which does not suggest overt eosinophilic inflammation in the airways.  I believe that her symptoms of dry cough for most likely related to laryngeal pathology but given her asthma history if vocal cord rest does not help and treatment of postnasal drip does not help then we may need to go back up on her asthma control regimen.  Plan: For your cough: You need to try to suppress your cough to allow your larynx (voice box) to heal.  For three days don't talk, laugh, sing, or clear your throat. Do everything you can to suppress the cough during this time. Use hard candies (sugarless Jolly Ranchers) or non-mint or non-menthol containing cough drops during this time to soothe your throat.  Use a cough suppressant (Delsym or what I have prescribed you) around the clock during this time.  After three days, gradually increase the use of your voice and back off on the cough suppressants.  If this doesn't help your cough, let me know and I can change your prescription to Advair  Take a Zyrtec daily.  We will see you back in 6 months or sooner if needed  20 minutes spent face to face, 29 minutes spent on this visit  F/u 6 months

## 2016-10-05 NOTE — Progress Notes (Signed)
Subjective:    Patient ID: Mary Garcia, female    DOB: 1946-07-14, 71 y.o.   MRN: [id number]  Synopsis: Former patient of Dr. Gwenette Garcia who has asthma Dr. Gwenette Garcia summarized her clinical situation as follows: PFT's 11/2014: FEV1 1.73 (76%), ratio 71, 17% response to BD. No restriction, and DLCO is normal  Pt not overly symptomatic, except acute asthmatic bronchitis every time she got URI She smoked briefly in college.   No family history of lung disease Mother was diagnosed with asthma in adulthood  HPI Chief Complaint  Patient presents with  . Follow-up    pt c/o nonprod cough, some sob with exertion.    [name] has had a hard time with her husband's health lately.  She had bronchitis recently over the winter twice.  She saw Dr. Melvyn Garcia and Mary Garcia for both visits.  She says that her grandchildren were sick and she thinks she got it from both of them.  She thought that she may have been wheezing more prior to the episode.  She says that in the evenings now she has been dyspneic a little more at night and has a dry cough as well.      Past Medical History:  Diagnosis Date  . Anxiety   . Arthritis   . Bronchitis, acute       Review of Systems     Objective:   Physical Exam Vitals:   10/05/16 0908  BP: 124/62  Pulse: 89  SpO2: 98%  Weight: 141 lb (64 kg)  Height: 5\' 3"  (1.6 m)   RA  Gen: well appearing HENT: OP clear, TM's clear, neck supple PULM: CTA B, normal percussion CV: RRR, no mgr, trace edema GI: BS+, soft, nontender Derm: no cyanosis or rash Psyche: normal mood and affect       Assessment & Plan:  Asthma Mary Garcia had 2 episodes of bronchitis over the winter which I think were just viral, but she's been having increasing cough and some dyspnea in the evenings. So overall and concerned that her asthma control has been less well controlled recently.  Rotating matters her albuterol inhaler has not been working appropriately.  Today her excelled nitric oxide test was  16, which does not suggest overt eosinophilic inflammation in the airways.  I believe that her symptoms of dry cough for most likely related to laryngeal pathology but given her asthma history if vocal cord rest does not help and treatment of postnasal drip does not help then we may need to go back up on her asthma control regimen.  Plan: For your cough: You need to try to suppress your cough to allow your larynx (voice box) to heal.  For three days don't talk, laugh, sing, or clear your throat. Do everything you can to suppress the cough during this time. Use hard candies (sugarless Mary Garcia) or non-mint or non-menthol containing cough drops during this time to soothe your throat.  Use a cough suppressant (Mary Garcia or what I have prescribed you) around the clock during this time.  After three days, gradually increase the use of your voice and back off on the cough suppressants.  If this doesn't help your cough, let me know and I can change your prescription to Mary Garcia  Take a Mary Garcia daily.  We will see you back in 6 months or sooner if needed  20 minutes spent face to face, 29 minutes spent on this visit  F/u 6 months    Current Outpatient Prescriptions:  .  albuterol (PROVENTIL HFA;VENTOLIN HFA) 108 (90 Base) MCG/ACT inhaler, Inhale 2 puffs into the lungs every 6 (six) hours as needed for wheezing or shortness of breath., Disp: 1 Inhaler, Rfl: 11 .  ALPRAZolam (Mary Garcia) 0.25 MG tablet, TK 1 T PO ONCE DAILY PRN, Disp: , Rfl: 0 .  diphenhydramine-acetaminophen (TYLENOL PM) 25-500 MG TABS, Take 1 tablet by mouth at bedtime as needed (pain)., Disp: , Rfl:  .  ELDERBERRY PO, Take by mouth., Disp: , Rfl:  .  FLOVENT HFA 220 MCG/ACT inhaler, INHALE 2 PUFFS INTO THE LUNGS TWICE DAILY (Patient taking differently: INHALE 1 PUFF INTO THE LUNGS TWICE DAILY), Disp: 12 g, Rfl: 5 .  MELATONIN PO, Take by mouth., Disp: , Rfl:  .  pantoprazole (PROTONIX) 40 MG tablet, TAKE ONE TABLET BY MOUTH EVERY  DAY, Disp: 30 tablet, Rfl: 11 .  Vitamin D, Cholecalciferol, 1000 UNITS TABS, Take 1 tablet by mouth daily., Disp: , Rfl:  .  benzonatate (TESSALON) 200 MG capsule, Take 1 capsule (200 mg total) by mouth 3 (three) times daily as needed for cough., Disp: 45 capsule, Rfl: 1

## 2016-11-04 ENCOUNTER — Ambulatory Visit
Admission: RE | Admit: 2016-11-04 | Discharge: 2016-11-04 | Disposition: A | Discharge status: Home / Self Care | Payer: PPO | Source: Ambulatory Visit | Attending: Internal Medicine | Admitting: Internal Medicine

## 2016-11-04 ENCOUNTER — Ambulatory Visit (INDEPENDENT_AMBULATORY_CARE_PROVIDER_SITE_OTHER): Payer: PPO | Admitting: Internal Medicine

## 2016-11-04 ENCOUNTER — Encounter: Payer: Self-pay | Admitting: Internal Medicine

## 2016-11-04 VITALS — BP 102/78 | HR 102 | Temp 98.5°F | Ht 63.0 in | Wt 144.0 lb

## 2016-11-04 DIAGNOSIS — R0989 Other specified symptoms and signs involving the circulatory and respiratory systems: Secondary | ICD-10-CM | POA: Diagnosis not present

## 2016-11-04 DIAGNOSIS — J440 Chronic obstructive pulmonary disease with acute lower respiratory infection: Secondary | ICD-10-CM | POA: Diagnosis not present

## 2016-11-04 DIAGNOSIS — J209 Acute bronchitis, unspecified: Secondary | ICD-10-CM

## 2016-11-04 DIAGNOSIS — R05 Cough: Secondary | ICD-10-CM | POA: Diagnosis not present

## 2016-11-04 MED ORDER — LEVOFLOXACIN 500 MG PO TABS: 500.0000 mg | ORAL_TABLET | Freq: Every day | ORAL | 0 refills | Status: DC

## 2016-11-04 MED ORDER — HYDROCODONE-HOMATROPINE 5-1.5 MG/5ML PO SYRP: 5.0000 mL | ORAL_SOLUTION | Freq: Three times a day (TID) | ORAL | 0 refills | Status: DC | PRN

## 2016-11-04 MED ORDER — PREDNISONE 10 MG PO TABS: ORAL_TABLET | ORAL | 0 refills | Status: DC

## 2016-11-04 MED ORDER — CEFTRIAXONE SODIUM 1 G IJ SOLR
1.0000 g | Freq: Once | INTRAMUSCULAR | Status: AC
  Administered 2016-11-04: 1 g via INTRAMUSCULAR

## 2016-11-04 NOTE — Patient Instructions (Signed)
Rocephin 1 g IM. Levaquin 500 milligrams daily for 10 days. Hycodan 1 teaspoon by mouth every 8 hours when necessary cough. Use inhalers as directed. Rest and drink plenty of fluids. Sterapred DS 10 mg 6 day Dosepak.

## 2016-11-04 NOTE — Progress Notes (Signed)
   Subjective:    Patient ID: Mary Garcia, female    DOB: 1946/06/23, 71 y.o.   MRN: [id number]  HPI Saw Dr. Melvyn Novas  in November for respiratory infection. Saw NP at Pulmonary in late January for another respiratory infection. Says it seems to be triggered being around sick grandchildren. Has Flovent and albuterol inhalers. Has had symptoms onset earlier this week. No documented fever. Discolored sputum. Deep congested cough.CXR in January showed no pneumonia.    Review of Systems fatigue and malaise     Objective:   Physical Exam  HENT:  Head: Normocephalic and atraumatic.  Right Ear: External ear normal.  Left Ear: External ear normal.  Mouth/Throat: Oropharynx is clear and moist.  Neck: Neck supple.  Cardiovascular: Normal rate, regular rhythm and normal heart sounds.   Pulmonary/Chest: Effort normal.  Deep congested cough. Has rales right lateral lung.  Musculoskeletal: She exhibits no edema.  Lymphadenopathy:    She has no cervical adenopathy.          Assessment & Plan:  Acute bronchitis  COPD  Possible right middle lobe pneumonia  Plan: Chest x-ray. 1 g IM Rocephin. Levaquin 500 milligrams daily for 10 days. Sterapred DS 10 mg 6 day dosepak. Use inhalers as directed. Rest and drink plenty of fluids. Hycodan 1 teaspoon by mouth every 8 hours when necessary cough with no refill.  Addendum: Chest x-ray-no pneumonia

## 2017-02-06 DIAGNOSIS — M65312 Trigger thumb, left thumb: Secondary | ICD-10-CM | POA: Diagnosis not present

## 2017-02-06 DIAGNOSIS — M1812 Unilateral primary osteoarthritis of first carpometacarpal joint, left hand: Secondary | ICD-10-CM | POA: Diagnosis not present

## 2017-03-16 ENCOUNTER — Telehealth: Payer: Self-pay

## 2017-03-16 MED ORDER — ZOLPIDEM TARTRATE 10 MG PO TABS: 10.0000 mg | ORAL_TABLET | Freq: Every day | ORAL | 3 refills | Status: DC

## 2017-03-16 NOTE — Telephone Encounter (Signed)
Pt called requesting a refill on her Ambien 10 mg be sent to her new pharmacy [facility] on [address]. Please advise

## 2017-03-16 NOTE — Telephone Encounter (Signed)
Refill through [facility]- has CPE December

## 2017-03-16 NOTE — Telephone Encounter (Signed)
Phoned in to pharmacy. 

## 2017-05-29 ENCOUNTER — Encounter: Payer: Self-pay | Admitting: Pulmonary Disease

## 2017-05-29 ENCOUNTER — Ambulatory Visit (INDEPENDENT_AMBULATORY_CARE_PROVIDER_SITE_OTHER): Payer: PPO | Admitting: Pulmonary Disease

## 2017-05-29 VITALS — BP 128/82 | HR 85 | Ht 63.0 in | Wt 138.6 lb

## 2017-05-29 DIAGNOSIS — R059 Cough, unspecified: Secondary | ICD-10-CM

## 2017-05-29 DIAGNOSIS — J45909 Unspecified asthma, uncomplicated: Secondary | ICD-10-CM

## 2017-05-29 DIAGNOSIS — Z23 Encounter for immunization: Secondary | ICD-10-CM | POA: Diagnosis not present

## 2017-05-29 DIAGNOSIS — R05 Cough: Secondary | ICD-10-CM

## 2017-05-29 DIAGNOSIS — J3089 Other allergic rhinitis: Secondary | ICD-10-CM | POA: Diagnosis not present

## 2017-05-29 MED ORDER — BECLOMETHASONE DIPROP HFA 80 MCG/ACT IN AERB: 2.0000 | INHALATION_SPRAY | Freq: Two times a day (BID) | RESPIRATORY_TRACT | 11 refills | Status: DC

## 2017-05-29 NOTE — Patient Instructions (Signed)
Allergic rhinitis: Use Neil Med rinses with distilled water at least twice per day using the instructions on the package. 1/2 hour after using the [facility] Med rinse, use Nasacort two puffs in each nostril once per day.  Remember that the Nasacort can take 1-2 weeks to work after regular use. Use generic zyrtec (cetirizine) every day.  If this doesn't help, then stop taking it and use chlorpheniramine-phenylephrine combination tablets.  Asthma: If you develop cough, wheezing or chest tightness start taking QVar 91mcg 2 puffs daily  We will check with Dr. Renold Genta to see if she has records of you receiving the pneumovax vaccine  We will see you back in February or March of this year

## 2017-05-29 NOTE — Progress Notes (Signed)
Subjective:    Patient ID: Mary Garcia, female    DOB: Apr 20, 1946, 71 y.o.   MRN: [id number]  Synopsis: Former patient of Dr. Gwenette Greet who has asthma Dr. Gwenette Greet summarized her clinical situation as follows: Pt not overly symptomatic, except acute asthmatic bronchitis every time she got URI She smoked briefly in college.   No family history of lung disease Mother was diagnosed with asthma in adulthood  HPI Chief Complaint  Patient presents with  . Follow-up    pt feeling well   Since she saw [place] in February she has not had an exacerbation of her dyspnea. She has not been on any inhalers.  No wheezing, chest tightness or shortness of breath.  She says however that in the last few weeks she has been having a "bad taste from her sinuses" in the back of her throat. This happens a couple of times per day.  No breathing problems at all.  She has been swimming and walking more.  November is typically when she has a lot of bad problems.  She has had more sinus problems lately as well.      Past Medical History:  Diagnosis Date  . Anxiety   . Arthritis   . Bronchitis, acute       Review of Systems     Objective:   Physical Exam Vitals:   05/29/17 1521  BP: 128/82  Pulse: 85  SpO2: 98%  Weight: 138 lb 9.6 oz (62.9 kg)  Height: 5\' 3"  (1.6 m)   RA  Gen: well appearing HENT: OP clear, TM's clear, neck supple PULM: CTA B, normal percussion CV: RRR, no mgr, trace edema GI: BS+, soft, nontender Derm: no cyanosis or rash Psyche: normal mood and affect   PFT PFT's 11/2014: FEV1 1.73 (76%), ratio 71, 17% response to BD. No restriction, and DLCO is normal      Assessment & Plan:   Uncomplicated asthma, unspecified asthma severity, unspecified whether persistent  Cough  Seasonal allergic rhinitis due to other allergic trigger  Discussion: This has been a stable interval for superior however, she's noting some increased allergic rhinitis symptoms in the last few days. In our  previous visits we have felt that most of her symptoms are due to allergic rhinitis and upper airway irritation rather than asthma. However,she may have mild persistent asthma which is worse in the winter.   Plan: Allergic rhinitis: Use Neil Med rinses with distilled water at least twice per day using the instructions on the package. 1/2 hour after using the [facility] Med rinse, use Nasacort two puffs in each nostril once per day.  Remember that the Nasacort can take 1-2 weeks to work after regular use. Use generic zyrtec (cetirizine) every day.  If this doesn't help, then stop taking it and use chlorpheniramine-phenylephrine combination tablets.  Asthma: If you develop cough, wheezing or chest tightness start taking QVar 24mcg 2 puffs daily Flu shot today  We will check with Dr. Renold Genta to see if she has records of you receiving the pneumovax vaccine  We will see you back in February or March of this year    Current Outpatient Prescriptions:  .  albuterol (PROVENTIL HFA;VENTOLIN HFA) 108 (90 Base) MCG/ACT inhaler, Inhale 2 puffs into the lungs every 6 (six) hours as needed for wheezing or shortness of breath., Disp: 1 Inhaler, Rfl: 11 .  ALPRAZolam (XANAX) 0.25 MG tablet, TK 1 T PO ONCE DAILY PRN, Disp: , Rfl: 0 .  diphenhydramine-acetaminophen (TYLENOL  PM) 25-500 MG TABS, Take 1 tablet by mouth at bedtime as needed (pain)., Disp: , Rfl:  .  ELDERBERRY PO, Take by mouth., Disp: , Rfl:  .  FLOVENT HFA 220 MCG/ACT inhaler, INHALE 2 PUFFS INTO THE LUNGS TWICE DAILY (Patient taking differently: INHALE 1 PUFF INTO THE LUNGS TWICE DAILY), Disp: 12 g, Rfl: 5 .  MELATONIN PO, Take by mouth., Disp: , Rfl:  .  pantoprazole (PROTONIX) 40 MG tablet, TAKE ONE TABLET BY MOUTH EVERY DAY, Disp: 30 tablet, Rfl: 11 .  Vitamin D, Cholecalciferol, 1000 UNITS TABS, Take 1 tablet by mouth daily., Disp: , Rfl:

## 2017-06-13 ENCOUNTER — Encounter: Payer: Self-pay | Admitting: Pulmonary Disease

## 2017-06-13 ENCOUNTER — Ambulatory Visit (INDEPENDENT_AMBULATORY_CARE_PROVIDER_SITE_OTHER): Payer: PPO | Admitting: Pulmonary Disease

## 2017-06-13 VITALS — BP 126/78 | HR 92 | Temp 98.4°F | Ht 63.0 in | Wt 138.0 lb

## 2017-06-13 DIAGNOSIS — R05 Cough: Secondary | ICD-10-CM

## 2017-06-13 DIAGNOSIS — J029 Acute pharyngitis, unspecified: Secondary | ICD-10-CM

## 2017-06-13 DIAGNOSIS — J4530 Mild persistent asthma, uncomplicated: Secondary | ICD-10-CM | POA: Diagnosis not present

## 2017-06-13 DIAGNOSIS — R059 Cough, unspecified: Secondary | ICD-10-CM

## 2017-06-13 MED ORDER — PREDNISONE 20 MG PO TABS: 20.0000 mg | ORAL_TABLET | Freq: Every day | ORAL | 0 refills | Status: DC

## 2017-06-13 MED ORDER — HYDROCOD POLST-CPM POLST ER 10-8 MG/5ML PO SUER: 5.0000 mL | Freq: Two times a day (BID) | ORAL | 0 refills | Status: DC | PRN

## 2017-06-13 MED ORDER — DOXYCYCLINE HYCLATE 100 MG PO TABS: 100.0000 mg | ORAL_TABLET | Freq: Two times a day (BID) | ORAL | 0 refills | Status: DC

## 2017-06-13 MED FILL — HYDROCODONE-CHLORPHENIRAM S: 10-8 | 14 days supply | Qty: 140 | Fill #0

## 2017-06-13 NOTE — Progress Notes (Signed)
Subjective:    Patient ID: Mary Garcia, female    DOB: 11-30-1945, 71 y.o.   MRN: [id number]  Synopsis: Former patient of Dr. Gwenette Greet who has asthma Dr. Gwenette Greet summarized her clinical situation as follows: Pt not overly symptomatic, except acute asthmatic bronchitis every time she got URI She smoked briefly in college.   No family history of lung disease Mother was diagnosed with asthma in adulthood  HPI Chief Complaint  Patient presents with  . Acute Visit    c/o worsening PND, prod cough with green mucus, sore throat Xseveral days.     Mary Garcia has been feeling worse since the last visit.  She notes several days of headache, thick green mucus from her sinuses, cough productive of brown mucus.  She feels like she may had a low grade headache.  No dyspnea but she feels like she is wheezing.  Many family members are sick.  Past Medical History:  Diagnosis Date  . Anxiety   . Arthritis   . Bronchitis, acute       Review of Systems     Objective:   Physical Exam Vitals:   06/13/17 1626  BP: 126/78  Pulse: 92  Temp: 98.4 F (36.9 C)  TempSrc: Oral  SpO2: 97%  Weight: 138 lb (62.6 kg)  Height: 5\' 3"  (1.6 m)   RA  Gen: well appearing HENT: OP clear, TM's clear, neck supple PULM: CTA B, normal percussion CV: RRR, no mgr, trace edema GI: BS+, soft, nontender Derm: no cyanosis or rash Psyche: normal mood and affect    PFT PFT's 11/2014: FEV1 1.73 (76%), ratio 71, 17% response to BD. No restriction, and DLCO is normal      Assessment & Plan:   Acute pharyngitis, unspecified etiology  Mild persistent asthma, unspecified whether complicated  Cough  Discussion: Dina has an acute URI which has persisted for several days despite conservative management.  Her lungs sound OK today, I don't suspect an asthma exacerbation.    Plan: Cough with upper respiratory infection: Take the doxycycline 100mg  twice a day x 5 days Take the tussionex twice a day as needed for  cough Take chlorpheniramine-phenylephrine decongestant tabs as directed If you can't get the chlorpheniramine-phenylephrine tabs, then buy advil cold (ibuprophen with pseudoephedrine) as directed If you developed worsening symptoms, or cough or wheezing then take the prednisone  Let us know if you are not improving    Current Outpatient Prescriptions:  .  albuterol (PROVENTIL HFA;VENTOLIN HFA) 108 (90 Base) MCG/ACT inhaler, Inhale 2 puffs into the lungs every 6 (six) hours as needed for wheezing or shortness of breath., Disp: 1 Inhaler, Rfl: 11 .  ALPRAZolam (XANAX) 0.25 MG tablet, TK 1 T PO ONCE PRN, Disp: , Rfl: 0 .  beclomethasone (QVAR REDIHALER) 80 MCG/ACT inhaler, Inhale 2 puffs into the lungs 2 (two) times daily., Disp: 1 Inhaler, Rfl: 11 .  diphenhydramine-acetaminophen (TYLENOL PM) 25-500 MG TABS, Take 1 tablet by mouth at bedtime as needed (pain)., Disp: , Rfl:  .  ELDERBERRY PO, Take by mouth., Disp: , Rfl:  .  FLOVENT HFA 220 MCG/ACT inhaler, INHALE 2 PUFFS INTO THE LUNGS TWICE DAILY (Patient taking differently: INHALE 1 PUFF INTO THE LUNGS TWICE DAILY), Disp: 12 g, Rfl: 5 .  MELATONIN PO, Take by mouth., Disp: , Rfl:  .  pantoprazole (PROTONIX) 40 MG tablet, TAKE ONE TABLET BY MOUTH EVERY DAY, Disp: 30 tablet, Rfl: 11 .  Vitamin D, Cholecalciferol, 1000 UNITS TABS, Take 1  tablet by mouth daily., Disp: , Rfl:

## 2017-06-13 NOTE — Patient Instructions (Signed)
Cough with upper respiratory infection: Take the doxycycline 100mg  twice a day x 5 days Take the tussionex twice a day as needed for cough Take chlorpheniramine-phenylephrine decongestant tabs as directed If you can't get the chlorpheniramine-phenylephrine tabs, then buy advil cold (ibuprophen with pseudoephedrine) as directed If you developed worsening symptoms, or cough or wheezing then take the prednisone  Let us know if you are not improving

## 2017-06-20 ENCOUNTER — Telehealth: Payer: Self-pay | Admitting: Pulmonary Disease

## 2017-06-20 MED ORDER — FLUTICASONE PROPIONATE HFA 220 MCG/ACT IN AERO: 2.0000 | INHALATION_SPRAY | Freq: Two times a day (BID) | RESPIRATORY_TRACT | 2 refills | Status: DC

## 2017-06-20 NOTE — Telephone Encounter (Signed)
BQ, please advise if it is ok to call in [place] for her. Thanks!

## 2017-06-20 NOTE — Telephone Encounter (Signed)
lmtcb x1 for pt. We do not currently have samples of qvar 80.

## 2017-06-20 NOTE — Telephone Encounter (Signed)
OK by me 

## 2017-06-20 NOTE — Telephone Encounter (Signed)
Patient returned call.  Advised we do not have samples of Qvar 80.  She states that Dr. Lake Bells usually will call in [place] and wanted to see if this was an option. CB is [phone].

## 2017-06-20 NOTE — Telephone Encounter (Signed)
Spoke with patient. She is aware that the RX has been sent in. Nothing else needed at time of call.

## 2017-06-30 ENCOUNTER — Encounter: Payer: Self-pay | Admitting: Internal Medicine

## 2017-06-30 DIAGNOSIS — M8589 Other specified disorders of bone density and structure, multiple sites: Secondary | ICD-10-CM | POA: Diagnosis not present

## 2017-06-30 DIAGNOSIS — M810 Age-related osteoporosis without current pathological fracture: Secondary | ICD-10-CM | POA: Diagnosis not present

## 2017-06-30 DIAGNOSIS — Z1231 Encounter for screening mammogram for malignant neoplasm of breast: Secondary | ICD-10-CM | POA: Diagnosis not present

## 2017-07-14 ENCOUNTER — Other Ambulatory Visit (INDEPENDENT_AMBULATORY_CARE_PROVIDER_SITE_OTHER): Payer: PPO | Admitting: Internal Medicine

## 2017-07-14 DIAGNOSIS — Z0000 Encounter for general adult medical examination without abnormal findings: Secondary | ICD-10-CM | POA: Diagnosis not present

## 2017-07-14 DIAGNOSIS — F329 Major depressive disorder, single episode, unspecified: Secondary | ICD-10-CM | POA: Diagnosis not present

## 2017-07-14 DIAGNOSIS — E559 Vitamin D deficiency, unspecified: Secondary | ICD-10-CM

## 2017-07-14 DIAGNOSIS — F419 Anxiety disorder, unspecified: Secondary | ICD-10-CM

## 2017-07-14 DIAGNOSIS — J45909 Unspecified asthma, uncomplicated: Secondary | ICD-10-CM

## 2017-07-14 DIAGNOSIS — G4700 Insomnia, unspecified: Secondary | ICD-10-CM | POA: Diagnosis not present

## 2017-07-14 DIAGNOSIS — M8580 Other specified disorders of bone density and structure, unspecified site: Secondary | ICD-10-CM | POA: Diagnosis not present

## 2017-07-14 DIAGNOSIS — F32A Depression, unspecified: Secondary | ICD-10-CM

## 2017-07-16 LAB — COMPLETE METABOLIC PANEL WITH GFR
AG RATIO: 1.7 (calc) (ref 1.0–2.5)
ALBUMIN MSPROF: 3.9 g/dL (ref 3.6–5.1)
ALKALINE PHOSPHATASE (APISO): 68 U/L (ref 33–130)
ALT: 13 U/L (ref 6–29)
AST: 17 U/L (ref 10–35)
BILIRUBIN TOTAL: 0.4 mg/dL (ref 0.2–1.2)
BUN: 20 mg/dL (ref 7–25)
CO2: 29 mmol/L (ref 20–32)
CREATININE: 0.71 mg/dL (ref 0.50–0.99)
Calcium: 9.5 mg/dL (ref 8.6–10.4)
Chloride: 104 mmol/L (ref 98–110)
GFR, Est African American: 101 mL/min/{1.73_m2} (ref >=60)
GFR, Est Non African American: 87 mL/min/{1.73_m2} (ref >=60)
GLUCOSE: 90 mg/dL (ref 65–99)
Globulin: 2.3 g/dL (calc) (ref 1.9–3.7)
Potassium: 4.9 mmol/L (ref 3.5–5.3)
SODIUM: 141 mmol/L (ref 135–146)
Total Protein: 6.2 g/dL (ref 6.1–8.1)

## 2017-07-16 LAB — CBC WITH DIFFERENTIAL/PLATELET
Basophils Absolute: 20 cells/uL (ref 0–200)
Basophils Relative: 0.4 %
EOS PCT: 2 %
Eosinophils Absolute: 100 cells/uL (ref 15–500)
HEMATOCRIT: 39.3 % (ref 35.0–45.0)
HEMOGLOBIN: 13.1 g/dL (ref 11.7–15.5)
LYMPHS ABS: 1605 {cells}/uL (ref 850–3900)
MCH: 28.5 pg (ref 27.0–33.0)
MCHC: 33.3 g/dL (ref 32.0–36.0)
MCV: 85.4 fL (ref 80.0–100.0)
MPV: 10 fL (ref 7.5–12.5)
Monocytes Relative: 9 %
NEUTROS ABS: 2825 {cells}/uL (ref 1500–7800)
Neutrophils Relative %: 56.5 %
Platelets: 333 10*3/uL (ref 140–400)
RBC: 4.6 10*6/uL (ref 3.80–5.10)
RDW: 12.9 % (ref 11.0–15.0)
Total Lymphocyte: 32.1 %
WBC mixed population: 450 cells/uL (ref 200–950)
WBC: 5 10*3/uL (ref 3.8–10.8)

## 2017-07-16 LAB — LIPID PANEL
CHOL/HDL RATIO: 1.7 (calc) (ref <5.0)
CHOLESTEROL: 218 mg/dL — AB (ref <200)
HDL: 127 mg/dL (ref >50)
LDL Cholesterol (Calc): 76 mg/dL (calc)
Non-HDL Cholesterol (Calc): 91 mg/dL (calc) (ref <130)
Triglycerides: 72 mg/dL (ref <150)

## 2017-07-16 LAB — TSH: TSH: 1.7 mIU/L (ref 0.40–4.50)

## 2017-07-18 DIAGNOSIS — J45998 Other asthma: Secondary | ICD-10-CM | POA: Diagnosis not present

## 2017-07-21 ENCOUNTER — Other Ambulatory Visit: Payer: Medicare Other | Admitting: Internal Medicine

## 2017-07-24 ENCOUNTER — Encounter: Payer: Medicare Other | Admitting: Internal Medicine

## 2017-08-01 DIAGNOSIS — M50321 Other cervical disc degeneration at C4-C5 level: Secondary | ICD-10-CM | POA: Diagnosis not present

## 2017-08-01 DIAGNOSIS — M50323 Other cervical disc degeneration at C6-C7 level: Secondary | ICD-10-CM | POA: Diagnosis not present

## 2017-08-01 DIAGNOSIS — M25512 Pain in left shoulder: Secondary | ICD-10-CM | POA: Diagnosis not present

## 2017-08-01 DIAGNOSIS — M50322 Other cervical disc degeneration at C5-C6 level: Secondary | ICD-10-CM | POA: Diagnosis not present

## 2017-08-04 ENCOUNTER — Other Ambulatory Visit: Payer: Self-pay | Admitting: Internal Medicine

## 2017-08-22 DIAGNOSIS — M5030 Other cervical disc degeneration, unspecified cervical region: Secondary | ICD-10-CM | POA: Diagnosis not present

## 2017-08-22 DIAGNOSIS — M25512 Pain in left shoulder: Secondary | ICD-10-CM | POA: Diagnosis not present

## 2017-08-29 DIAGNOSIS — M542 Cervicalgia: Secondary | ICD-10-CM | POA: Diagnosis not present

## 2017-09-07 ENCOUNTER — Other Ambulatory Visit: Payer: Self-pay

## 2017-09-07 MED ORDER — ZOLPIDEM TARTRATE 10 MG PO TABS: 10.0000 mg | ORAL_TABLET | Freq: Every evening | ORAL | 0 refills | Status: DC | PRN

## 2017-09-15 DIAGNOSIS — M5030 Other cervical disc degeneration, unspecified cervical region: Secondary | ICD-10-CM | POA: Insufficient documentation

## 2017-09-15 DIAGNOSIS — M542 Cervicalgia: Secondary | ICD-10-CM | POA: Diagnosis not present

## 2017-09-19 ENCOUNTER — Ambulatory Visit (INDEPENDENT_AMBULATORY_CARE_PROVIDER_SITE_OTHER): Payer: PPO | Admitting: Internal Medicine

## 2017-09-19 ENCOUNTER — Encounter: Payer: Self-pay | Admitting: Internal Medicine

## 2017-09-19 VITALS — BP 118/90 | HR 80 | Ht 62.5 in | Wt 140.0 lb

## 2017-09-19 DIAGNOSIS — K580 Irritable bowel syndrome with diarrhea: Secondary | ICD-10-CM | POA: Diagnosis not present

## 2017-09-19 DIAGNOSIS — M810 Age-related osteoporosis without current pathological fracture: Secondary | ICD-10-CM

## 2017-09-19 DIAGNOSIS — K219 Gastro-esophageal reflux disease without esophagitis: Secondary | ICD-10-CM

## 2017-09-19 DIAGNOSIS — M7918 Myalgia, other site: Secondary | ICD-10-CM | POA: Diagnosis not present

## 2017-09-19 DIAGNOSIS — Z8709 Personal history of other diseases of the respiratory system: Secondary | ICD-10-CM

## 2017-09-19 DIAGNOSIS — F411 Generalized anxiety disorder: Secondary | ICD-10-CM | POA: Diagnosis not present

## 2017-09-19 DIAGNOSIS — Z0000 Encounter for general adult medical examination without abnormal findings: Secondary | ICD-10-CM | POA: Diagnosis not present

## 2017-09-19 DIAGNOSIS — G4700 Insomnia, unspecified: Secondary | ICD-10-CM

## 2017-09-19 DIAGNOSIS — Z9882 Breast implant status: Secondary | ICD-10-CM

## 2017-09-19 DIAGNOSIS — F3289 Other specified depressive episodes: Secondary | ICD-10-CM | POA: Diagnosis not present

## 2017-09-19 LAB — POCT URINALYSIS DIPSTICK
Appearance: NORMAL
BILIRUBIN UA: NEGATIVE
Glucose, UA: NEGATIVE
KETONES UA: NEGATIVE
Leukocytes, UA: NEGATIVE
NITRITE UA: NEGATIVE
Odor: NORMAL
PH UA: 7 (ref 5.0–8.0)
Protein, UA: NEGATIVE
SPEC GRAV UA: 1.015 (ref 1.010–1.025)
UROBILINOGEN UA: 0.2 U/dL

## 2017-09-19 MED ORDER — PANTOPRAZOLE SODIUM 40 MG PO TBEC: 40.0000 mg | DELAYED_RELEASE_TABLET | Freq: Every day | ORAL | 1 refills | Status: DC

## 2017-09-19 MED ORDER — ALPRAZOLAM 0.25 MG PO TABS: ORAL_TABLET | ORAL | 1 refills | Status: DC

## 2017-09-19 MED ORDER — DICYCLOMINE HCL 20 MG PO TABS: 20.0000 mg | ORAL_TABLET | Freq: Four times a day (QID) | ORAL | 1 refills | Status: DC
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
## 2017-10-04 NOTE — Patient Instructions (Signed)
Endocrinology consultation regarding osteoporosis.  Continue same medications.  Return in 1 year or as needed.

## 2017-11-21 ENCOUNTER — Encounter: Payer: Self-pay | Admitting: Internal Medicine

## 2017-11-21 ENCOUNTER — Ambulatory Visit (INDEPENDENT_AMBULATORY_CARE_PROVIDER_SITE_OTHER): Payer: PPO | Admitting: Internal Medicine

## 2017-11-21 VITALS — BP 132/82 | HR 63 | Ht 62.5 in | Wt 141.0 lb

## 2017-11-21 DIAGNOSIS — M810 Age-related osteoporosis without current pathological fracture: Secondary | ICD-10-CM

## 2017-11-21 LAB — BASIC METABOLIC PANEL WITH GFR
BUN: 25 mg/dL (ref 7–25)
CALCIUM: 9.8 mg/dL (ref 8.6–10.4)
CHLORIDE: 106 mmol/L (ref 98–110)
CO2: 29 mmol/L (ref 20–32)
Creat: 0.81 mg/dL (ref 0.50–0.99)
GFR, EST AFRICAN AMERICAN: 86 mL/min/{1.73_m2} (ref >=60)
GFR, Est Non African American: 74 mL/min/{1.73_m2} (ref >=60)
Glucose, Bld: 96 mg/dL (ref 65–99)
POTASSIUM: 4.8 mmol/L (ref 3.5–5.3)
SODIUM: 141 mmol/L (ref 135–146)

## 2017-11-21 LAB — VITAMIN D 25 HYDROXY (VIT D DEFICIENCY, FRACTURES): VITD: 39.12 ng/mL (ref 30.00–100.00)
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
## 2017-11-21 NOTE — Patient Instructions (Signed)
Please stop at the lab.  Continue the same dose of vitamin D.  Please come back for a follow-up appointment in 1 year.  How Can I Prevent Falls? Men and women with osteoporosis need to take care not to fall down. Falls can break bones. Some reasons people fall are: Poor vision  Poor balance  Certain diseases that affect how you walk  Some types of medicine, such as sleeping pills.  Some tips to help prevent falls outdoors are: Use a cane or walker  Wear rubber-soled shoes so you don't slip  Walk on grass when sidewalks are slippery  In winter, put salt or kitty litter on icy sidewalks.  Some ways to help prevent falls indoors are: Keep rooms free of clutter, especially on floors  Use plastic or carpet runners on slippery floors  Wear low-heeled shoes that provide good support  Do not walk in socks, stockings, or slippers  Be sure carpets and area rugs have skid-proof backs or are tacked to the floor  Be sure stairs are well lit and have rails on both sides  Put grab bars on bathroom walls near tub, shower, and toilet  Use a rubber bath mat in the shower or tub  Keep a flashlight next to your bed  Use a sturdy step stool with a handrail and wide steps  Add more lights in rooms (and night lights) Buy a cordless phone to keep with you so that you don't have to rush to the phone       when it rings and so that you can call for help if you fall.   (adapted from http://www.niams.NightlifePreviews.se)  Please check out the following book about best diet for bone health:   Exercise for Strong Bones (from [employer]) There are two types of exercises that are important for building and maintaining bone density:  weight-bearing and muscle-strengthening exercises. Weight-bearing Exercises These exercises include activities that make you move against gravity while staying upright. Weight-bearing exercises can be high-impact or  low-impact. High-impact weight-bearing exercises help build bones and keep them strong. If you have broken a bone due to osteoporosis or are at risk of breaking a bone, you may need to avoid high-impact exercises. If youre not sure, you should check with your healthcare provider. Examples of high-impact weight-bearing exercises are:  Dancing  Doing high-impact aerobics  Hiking  Jogging/running  Jumping Rope  Stair climbing  Tennis Low-impact weight-bearing exercises can also help keep bones strong and are a safe alternative if you cannot do high-impact exercises. Examples of low-impact weight-bearing exercises are:  Using elliptical training machines  Doing low-impact aerobics  Using stair-step machines  Fast walking on a treadmill or outside Muscle-Strengthening Exercises These exercises include activities where you move your body, a weight or some other resistance against gravity. They are also known as resistance exercises and include:  Lifting weights  Using elastic exercise bands  Using weight machines  Lifting your own body weight  Functional movements, such as standing and rising up on your toes Yoga and Pilates can also improve strength, balance and flexibility. However, certain positions may not be safe for people with osteoporosis or those at increased risk of broken bones. For example, exercises that have you bend forward may increase the chance of breaking a bone in the spine. A physical therapist should be able to help you learn which exercises are safe and appropriate for you. Non-Impact Exercises Non-impact exercises can help you to improve balance, posture and  how well you move in everyday activities. These exercises can also help to increase muscle strength and decrease the risk of falls and broken bones. Some of these exercises include:  Balance exercises that strengthen your legs and test your balance, such as Tai Chi, can decrease your risk of  falls.  Posture exercises that improve your posture and reduce rounded or sloping shoulders can help you decrease the chance of breaking a bone, especially in the spine.  Functional exercises that improve how well you move can help you with everyday activities and decrease your chance of falling and breaking a bone. For example, if you have trouble getting up from a chair or climbing stairs, you should do these activities as exercises. A physical therapist can teach you balance, posture and functional exercises. Starting a New Exercise Program If you havent exercised regularly for a while, check with your healthcare provider before beginning a new exercise program--particularly if you have health problems such as heart disease, diabetes or high blood pressure. If youre at high risk of breaking a bone, you should work with a physical therapist to develop a safe exercise program. Once you have your healthcare providers approval, start slowly. If youve already broken bones in the spine because of osteoporosis, be very careful to avoid activities that require reaching down, bending forward, rapid twisting motions, heavy lifting and those that increase your chance of a fall. As you get started, your muscles may feel sore for a day or two after you exercise. If soreness lasts longer, you may be working too hard and need to ease up. Exercises should be done in a pain-free range of motion. How Much Exercise Do You Need? Weight-bearing exercises 30 minutes on most days of the week. Do a 30-minutesession or multiple sessions spread out throughout the day. The benefits to your bones are the same.   Muscle-strengthening exercises Two to three days per week. If you dont have much time for strengthening/resistance training, do small amounts at a time. You can do just one body part each day. For example do arms one day, legs the next and trunk the next. You can also spread these exercises out during your normal  day.  Balance, posture and functional exercises Every day or as often as needed. You may want to focus on one area more than the others. If you have fallen or lose your balance, spend time doing balance exercises. If you are getting rounded shoulders, work more on posture exercises. If you have trouble climbing stairs or getting up from the couch, do more functional exercises. You can also perform these exercises at one time or spread them during your day. Work with a phyiscal therapist to learn the right exercises for you.    Denosumab: Patient drug information (Up-to-date) Copyright [phone] Kevin rights reserved.  Brand Names: [place]  ProliaDelton See What is this drug used for?  It is used to treat soft, brittle bones (osteoporosis).  It is used for bone growth.  It is used when treating some cancers.  It may be given to you for other reasons. Talk with the doctor. What do I need to tell my doctor BEFORE I take this drug?  All products:  If you have an allergy to denosumab or any other part of this drug.  If you are allergic to any drugs like this one, any other drugs, foods, or other substances. Tell your doctor about the allergy and what signs you had, like rash;  hives; itching; shortness of breath; wheezing; cough; swelling of face, lips, tongue, or throat; or any other signs.  If you have low calcium levels.  Prolia:  If you are pregnant or may be pregnant. Do not take this drug if you are pregnant.  This is not a list of all drugs or health problems that interact with this drug.  Tell your doctor and pharmacist about all of your drugs (prescription or OTC, natural products, vitamins) and health problems. You must check to make sure that it is safe for you to take this drug with all of your drugs and health problems. Do not start, stop, or change the dose of any drug without checking with your doctor. What are some things I need to know or do while I take this drug?   All products:  Tell dentists, surgeons, and other doctors that you use this drug.  This drug may raise the chance of a broken leg. Talk with your doctor.  Have your blood work checked. Talk with your doctor.  Have a bone density test. Talk with your doctor.  Take calcium and vitamin D as you were told by your doctor.  Have a dental exam before starting this drug.  Take good care of your teeth. See a dentist often.  If you smoke, talk with your doctor.  Do not give to a child. Talk with your doctor.  Tell your doctor if you are breast-feeding. You will need to talk about any risks to your baby.  Delton See:  This drug may cause harm to the unborn baby if you take it while you are pregnant. If you get pregnant while taking this drug, call your doctor right away.  Prolia:  Very bad infections have been reported with use of this drug. If you have any infection, are taking antibiotics now or in the recent past, or have many infections, talk with your doctor.  You may have more chance of getting an infection. Wash hands often. Stay away from people with infections, colds, or flu.  Use birth control that you can trust to prevent pregnancy while taking this drug.  If you are a man and your sex partner is pregnant or gets pregnant at any time while you are being treated, talk with your doctor. What are some side effects that I need to call my doctor about right away?  WARNING/CAUTION: Even though it may be rare, some people may have very bad and sometimes deadly side effects when taking a drug. Tell your doctor or get medical help right away if you have any of the following signs or symptoms that may be related to a very bad side effect:  All products:  Signs of an allergic reaction, like rash; hives; itching; red, swollen, blistered, or peeling skin with or without fever; wheezing; tightness in the chest or throat; trouble breathing or talking; unusual hoarseness; or swelling of the  mouth, face, lips, tongue, or throat.  Signs of low calcium levels like muscle cramps or spasms, numbness and tingling, or seizures.  Mouth sores.  Any new or strange groin, hip, or thigh pain.  This drug may cause jawbone problems. The chance may be higher the longer you take this drug. The chance may be higher if you have cancer, dental problems, dentures that do not fit well, anemia, blood clotting problems, or an infection. The chance may also be higher if you are having dental work or if you are getting chemo, some steroid drugs, or radiation.  Call your doctor right away if you have jaw swelling or pain.  Xgeva:  Not hungry.  Muscle pain or weakness.  Seizures.  Shortness of breath.  Prolia:  Signs of infection. These include a fever of 100.20F (38C) or higher, chills, very bad sore throat, ear or sinus pain, cough, more sputum or change in color of sputum, pain with passing urine, mouth sores, wound that will not heal, or anal itching or pain.  Signs of a pancreas problem (pancreatitis) like very bad stomach pain, very bad back pain, or very bad upset stomach or throwing up.  Chest pain.  A heartbeat that does not feel normal.  Very bad skin irritation.  Feeling very tired or weak.  Bladder pain or pain when passing urine or change in how much urine is passed.  Passing urine often.  Swelling in the arms or legs. What are some other side effects of this drug?  All drugs may cause side effects. However, many people have no side effects or only have minor side effects. Call your doctor or get medical help if any of these side effects or any other side effects bother you or do not go away:  Xgeva:  Feeling tired or weak.  Headache.  Upset stomach or throwing up.  Loose stools (diarrhea).  Cough.  Prolia:  Back pain.  Muscle or joint pain.  Sore throat.  Runny nose.  Pain in arms or legs.  These are not all of the side effects that may occur. If you  have questions about side effects, call your doctor. Call your doctor for medical advice about side effects.  You may report side effects to your national health agency. How is this drug best taken?  Use this drug as ordered by your doctor. Read and follow the dosing on the label closely.  It is given as a shot into the fatty part of the skin. What do I do if I miss a dose?  Call the doctor to find out what to do. How do I store and/or throw out this drug?  This drug will be given to you in a hospital or doctor's office. You will not store it at home.  Keep all drugs out of the reach of children and pets.  Check with your pharmacist about how to throw out unused drugs.  General drug facts  If your symptoms or health problems do not get better or if they become worse, call your doctor.  Do not share your drugs with others and do not take anyone else's drugs.  Keep a list of all your drugs (prescription, natural products, vitamins, OTC) with you. Give this list to your doctor.  Talk with the doctor before starting any new drug, including prescription or OTC, natural products, or vitamins.  Some drugs may have another patient information leaflet. If you have any questions about this drug, please talk with your doctor, pharmacist, or other health care provider.  If you think there has been an overdose, call your poison control center or get medical care right away. Be ready to tell or show what was taken, how much, and when it happened.

## 2017-12-05 ENCOUNTER — Other Ambulatory Visit: Payer: Self-pay

## 2017-12-05 MED ORDER — ZOLPIDEM TARTRATE 10 MG PO TABS: 10.0000 mg | ORAL_TABLET | Freq: Every evening | ORAL | 3 refills | Status: DC | PRN

## 2018-02-26 ENCOUNTER — Encounter: Payer: Self-pay | Admitting: Internal Medicine

## 2018-02-26 ENCOUNTER — Telehealth: Payer: Self-pay

## 2018-02-26 ENCOUNTER — Ambulatory Visit (INDEPENDENT_AMBULATORY_CARE_PROVIDER_SITE_OTHER): Payer: PPO | Admitting: Internal Medicine

## 2018-02-26 VITALS — BP 120/80 | HR 72 | Temp 98.1°F | Ht 62.5 in | Wt 139.0 lb

## 2018-02-26 DIAGNOSIS — Z8709 Personal history of other diseases of the respiratory system: Secondary | ICD-10-CM

## 2018-02-26 DIAGNOSIS — M545 Low back pain, unspecified: Secondary | ICD-10-CM

## 2018-02-26 DIAGNOSIS — M818 Other osteoporosis without current pathological fracture: Secondary | ICD-10-CM

## 2018-02-26 MED ORDER — IBANDRONATE SODIUM 150 MG PO TABS: 150.0000 mg | ORAL_TABLET | ORAL | 5 refills | Status: DC

## 2018-02-26 MED ORDER — CYCLOBENZAPRINE HCL 10 MG PO TABS: ORAL_TABLET | ORAL | 0 refills | Status: DC

## 2018-02-26 MED ORDER — ALBUTEROL SULFATE 108 (90 BASE) MCG/ACT IN AEPB: 2.0000 | INHALATION_SPRAY | Freq: Four times a day (QID) | RESPIRATORY_TRACT | 99 refills | Status: DC

## 2018-02-26 NOTE — Patient Instructions (Signed)
Take Boniva 150 mg monthly.  Try Flexeril 10 mg 1/2 tablet at bedtime for musculoskeletal pain

## 2018-02-26 NOTE — Telephone Encounter (Signed)
Flexeril was approved. Insurance will fax approval.

## 2018-02-26 NOTE — Progress Notes (Signed)
   Subjective:    Patient ID: Mary Garcia, female    DOB: 1946-03-07, 72 y.o.   MRN: [id number]  HPI Patient here for several reasons.  She saw Dr. Maryellen Pile, endocrinologist regarding consultation for osteoporosis.  After thinking about the use a great deal she thinks she would like to try Boniva rather than Prolia.  Jaclyn Prime has been prescribed for her to try but I would like for her to not take it prior to going on her biking vacation in August.  DEXA scan from [facility] 2018 reviewed.  Lowest T score -3.0 in the LS spine.  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  She sleeps on her stomach.  She is having some para thoracic and lumbar back pain.  I have prescribed a muscle relaxant for her to take at bedtime.  She does not really want to go to physical therapy at this point in time.  She is only taking a third of an Ambien tablet at bedtime.  Dr. Sandi Mariscal called in for her Pro-air Respiclick recently.  He is her husband's doctor.  She would like a refill on this medication which was called in today to her pharmacy dispense as written.  Her Pulmonologist is Dr. Lake Bells.    Review of Systems see above     Objective:   Physical Exam Straight leg raising is negative at 90 degrees bilaterally.  She has parallel lower thoracic and para upper lumbar tenderness bilaterally.  Muscle strength is normal in the lower extremities.       Assessment & Plan:  Osteoporosis-prescription for Boniva to take 1 tablet monthly on empty stomach with no other medication with full glass of water  Parathoracic and paralumbar back pain.  She usually takes Aleve or Advil if necessary.  I prescribed muscle relaxant one half of a 10 mg Flexeril tablet if needed.  If not getting better we would consider physical therapy for 3 weeks before ordering an MRI.  She was told that today.  History of asthma-ProAir Respiclick prescribed at her request

## 2018-05-04 ENCOUNTER — Telehealth: Payer: Self-pay | Admitting: Internal Medicine

## 2018-05-04 DIAGNOSIS — J4531 Mild persistent asthma with (acute) exacerbation: Secondary | ICD-10-CM

## 2018-05-04 MED ORDER — ALBUTEROL SULFATE 108 (90 BASE) MCG/ACT IN AEPB: 2.0000 | INHALATION_SPRAY | Freq: Four times a day (QID) | RESPIRATORY_TRACT | 0 refills | Status: DC

## 2018-05-04 NOTE — Telephone Encounter (Signed)
Escribed

## 2018-05-04 NOTE — Telephone Encounter (Signed)
[place]  She was having trouble breathing on the way there.  She has her inhaler with her.  However, once she arrived she noticed that her inhaler has nothing left in it.  She wants to know if you would call her in [place] [facility] [place] [facility] [place]?    [facility] [place]:  [phone]  Thank you.   Phone:  [phone]

## 2018-06-05 DIAGNOSIS — D485 Neoplasm of uncertain behavior of skin: Secondary | ICD-10-CM | POA: Diagnosis not present

## 2018-06-05 DIAGNOSIS — D2230 Melanocytic nevi of unspecified part of face: Secondary | ICD-10-CM | POA: Diagnosis not present

## 2018-06-05 DIAGNOSIS — D2272 Melanocytic nevi of left lower limb, including hip: Secondary | ICD-10-CM | POA: Diagnosis not present

## 2018-06-05 DIAGNOSIS — B353 Tinea pedis: Secondary | ICD-10-CM | POA: Diagnosis not present

## 2018-06-05 DIAGNOSIS — Z23 Encounter for immunization: Secondary | ICD-10-CM | POA: Diagnosis not present

## 2018-06-05 DIAGNOSIS — D2361 Other benign neoplasm of skin of right upper limb, including shoulder: Secondary | ICD-10-CM | POA: Diagnosis not present

## 2018-06-05 DIAGNOSIS — Z411 Encounter for cosmetic surgery: Secondary | ICD-10-CM | POA: Diagnosis not present

## 2018-06-15 ENCOUNTER — Other Ambulatory Visit: Payer: Self-pay

## 2018-06-15 MED ORDER — IBANDRONATE SODIUM 150 MG PO TABS: 150.0000 mg | ORAL_TABLET | ORAL | 5 refills | Status: DC

## 2018-06-15 NOTE — Telephone Encounter (Signed)
Faxed boniva to [facility] on [address].

## 2018-06-19 ENCOUNTER — Other Ambulatory Visit: Payer: Self-pay

## 2018-06-19 MED ORDER — ZOLPIDEM TARTRATE 10 MG PO TABS: 10.0000 mg | ORAL_TABLET | Freq: Every evening | ORAL | 1 refills | Status: DC | PRN

## 2018-09-18 ENCOUNTER — Other Ambulatory Visit: Payer: PPO | Admitting: Internal Medicine

## 2018-09-18 DIAGNOSIS — M7918 Myalgia, other site: Secondary | ICD-10-CM

## 2018-09-18 DIAGNOSIS — M810 Age-related osteoporosis without current pathological fracture: Secondary | ICD-10-CM

## 2018-09-18 DIAGNOSIS — G4700 Insomnia, unspecified: Secondary | ICD-10-CM | POA: Diagnosis not present

## 2018-09-18 DIAGNOSIS — Z0000 Encounter for general adult medical examination without abnormal findings: Secondary | ICD-10-CM

## 2018-09-18 DIAGNOSIS — F411 Generalized anxiety disorder: Secondary | ICD-10-CM

## 2018-09-18 DIAGNOSIS — K219 Gastro-esophageal reflux disease without esophagitis: Secondary | ICD-10-CM

## 2018-09-18 DIAGNOSIS — F3289 Other specified depressive episodes: Secondary | ICD-10-CM

## 2018-09-18 DIAGNOSIS — K580 Irritable bowel syndrome with diarrhea: Secondary | ICD-10-CM | POA: Diagnosis not present

## 2018-09-18 LAB — CBC WITH DIFFERENTIAL/PLATELET
ABSOLUTE MONOCYTES: 311 {cells}/uL (ref 200–950)
BASOS ABS: 19 {cells}/uL (ref 0–200)
Basophils Relative: 0.5 %
EOS ABS: 59 {cells}/uL (ref 15–500)
Eosinophils Relative: 1.6 %
HCT: 41.2 % (ref 35.0–45.0)
Hemoglobin: 14.1 g/dL (ref 11.7–15.5)
Lymphs Abs: 1092 cells/uL (ref 850–3900)
MCH: 29.3 pg (ref 27.0–33.0)
MCHC: 34.2 g/dL (ref 32.0–36.0)
MCV: 85.7 fL (ref 80.0–100.0)
MONOS PCT: 8.4 %
MPV: 10.8 fL (ref 7.5–12.5)
Neutro Abs: 2220 cells/uL (ref 1500–7800)
Neutrophils Relative %: 60 %
PLATELETS: 277 10*3/uL (ref 140–400)
RBC: 4.81 10*6/uL (ref 3.80–5.10)
RDW: 13.8 % (ref 11.0–15.0)
TOTAL LYMPHOCYTE: 29.5 %
WBC: 3.7 10*3/uL — ABNORMAL LOW (ref 3.8–10.8)

## 2018-09-18 LAB — LIPID PANEL
CHOL/HDL RATIO: 2.2 (calc) (ref <5.0)
CHOLESTEROL: 233 mg/dL — AB (ref <200)
HDL: 104 mg/dL (ref >=50)
LDL CHOLESTEROL (CALC): 110 mg/dL — AB
Non-HDL Cholesterol (Calc): 129 mg/dL (calc) (ref <130)
Triglycerides: 91 mg/dL (ref <150)

## 2018-09-18 LAB — TSH: TSH: 1.76 mIU/L (ref 0.40–4.50)

## 2018-09-18 LAB — COMPLETE METABOLIC PANEL WITH GFR
AG Ratio: 1.9 (calc) (ref 1.0–2.5)
ALBUMIN MSPROF: 4.2 g/dL (ref 3.6–5.1)
ALKALINE PHOSPHATASE (APISO): 51 U/L (ref 37–153)
ALT: 12 U/L (ref 6–29)
AST: 18 U/L (ref 10–35)
BUN: 18 mg/dL (ref 7–25)
CO2: 26 mmol/L (ref 20–32)
CREATININE: 0.67 mg/dL (ref 0.60–0.93)
Calcium: 9 mg/dL (ref 8.6–10.4)
Chloride: 105 mmol/L (ref 98–110)
GFR, Est African American: 103 mL/min/{1.73_m2} (ref >=60)
GFR, Est Non African American: 89 mL/min/{1.73_m2} (ref >=60)
Globulin: 2.2 g/dL (calc) (ref 1.9–3.7)
Glucose, Bld: 88 mg/dL (ref 65–99)
Potassium: 4.5 mmol/L (ref 3.5–5.3)
Sodium: 140 mmol/L (ref 135–146)
Total Bilirubin: 0.4 mg/dL (ref 0.2–1.2)
Total Protein: 6.4 g/dL (ref 6.1–8.1)

## 2018-09-25 ENCOUNTER — Encounter: Payer: Self-pay | Admitting: Internal Medicine

## 2018-09-25 ENCOUNTER — Ambulatory Visit (INDEPENDENT_AMBULATORY_CARE_PROVIDER_SITE_OTHER): Payer: PPO | Admitting: Internal Medicine

## 2018-09-25 VITALS — BP 130/82 | HR 75 | Ht 62.5 in | Wt 131.0 lb

## 2018-09-25 DIAGNOSIS — Z0000 Encounter for general adult medical examination without abnormal findings: Secondary | ICD-10-CM

## 2018-09-25 DIAGNOSIS — K219 Gastro-esophageal reflux disease without esophagitis: Secondary | ICD-10-CM

## 2018-09-25 DIAGNOSIS — F411 Generalized anxiety disorder: Secondary | ICD-10-CM | POA: Diagnosis not present

## 2018-09-25 DIAGNOSIS — M810 Age-related osteoporosis without current pathological fracture: Secondary | ICD-10-CM

## 2018-09-25 DIAGNOSIS — Z8709 Personal history of other diseases of the respiratory system: Secondary | ICD-10-CM

## 2018-09-25 DIAGNOSIS — G4700 Insomnia, unspecified: Secondary | ICD-10-CM

## 2018-09-25 DIAGNOSIS — K580 Irritable bowel syndrome with diarrhea: Secondary | ICD-10-CM

## 2018-09-25 DIAGNOSIS — Z9882 Breast implant status: Secondary | ICD-10-CM

## 2018-09-25 LAB — POCT URINALYSIS DIPSTICK
Appearance: NEGATIVE
Bilirubin, UA: NEGATIVE
Blood, UA: NEGATIVE
Glucose, UA: NEGATIVE
Ketones, UA: NEGATIVE
Leukocytes, UA: NEGATIVE
Nitrite, UA: NEGATIVE
Odor: NEGATIVE
PROTEIN UA: NEGATIVE
Spec Grav, UA: 1.01 (ref 1.010–1.025)
Urobilinogen, UA: 0.2 E.U./dL
pH, UA: 6.5 (ref 5.0–8.0)
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
## 2018-10-29 ENCOUNTER — Telehealth: Payer: Self-pay | Admitting: Internal Medicine

## 2018-10-29 NOTE — Telephone Encounter (Signed)
Mary Garcia called and schedule an appointment for tommorrow

## 2018-10-29 NOTE — Telephone Encounter (Signed)
Mary Garcia [phone]  Just got back from [place] in [place] called to say she has her seasonal allergy, with cough,congestion, drainage in the morning. No fever, has not been in contact with anyone sick. She is wanting to know if something can be called in, she does not want this to turn into bronchitis.

## 2018-10-29 NOTE — Telephone Encounter (Signed)
Left detailed message.  appt av at 4:15pm. Pt to call back to confirm.

## 2018-10-29 NOTE — Telephone Encounter (Signed)
Will need ov to assess

## 2018-10-30 ENCOUNTER — Other Ambulatory Visit: Payer: Self-pay

## 2018-10-30 ENCOUNTER — Encounter: Payer: Self-pay | Admitting: Internal Medicine

## 2018-10-30 ENCOUNTER — Ambulatory Visit (INDEPENDENT_AMBULATORY_CARE_PROVIDER_SITE_OTHER): Payer: PPO | Admitting: Internal Medicine

## 2018-10-30 VITALS — BP 122/70 | HR 88 | Temp 98.5°F | Ht 62.5 in | Wt 131.0 lb

## 2018-10-30 DIAGNOSIS — J22 Unspecified acute lower respiratory infection: Secondary | ICD-10-CM

## 2018-10-30 DIAGNOSIS — Z8709 Personal history of other diseases of the respiratory system: Secondary | ICD-10-CM | POA: Diagnosis not present

## 2018-10-30 MED ORDER — HYDROCODONE-HOMATROPINE 5-1.5 MG/5ML PO SYRP: 5.0000 mL | ORAL_SOLUTION | Freq: Three times a day (TID) | ORAL | 0 refills | Status: DC | PRN

## 2018-10-30 MED ORDER — AZITHROMYCIN 250 MG PO TABS: ORAL_TABLET | ORAL | 0 refills | Status: DC

## 2018-11-06 ENCOUNTER — Ambulatory Visit (INDEPENDENT_AMBULATORY_CARE_PROVIDER_SITE_OTHER): Payer: PPO | Admitting: Internal Medicine

## 2018-11-06 ENCOUNTER — Other Ambulatory Visit: Payer: Self-pay

## 2018-11-06 ENCOUNTER — Encounter: Payer: Self-pay | Admitting: Internal Medicine

## 2018-11-06 VITALS — BP 120/80 | HR 68 | Temp 98.7°F

## 2018-11-06 DIAGNOSIS — Z23 Encounter for immunization: Secondary | ICD-10-CM | POA: Diagnosis not present

## 2018-11-06 NOTE — Progress Notes (Signed)
Pneumococcal 23 vaccine given by CMA 

## 2018-11-06 NOTE — Patient Instructions (Signed)
Patient received a Pneumovax 23 IM, L deltoid. AV,CMA.

## 2018-11-09 ENCOUNTER — Other Ambulatory Visit: Payer: PPO | Admitting: Internal Medicine

## 2018-11-10 NOTE — Patient Instructions (Signed)
Use inhalers if needed for shortness of breath or wheezing.  Zithromax Z-PAK 2 p.o. day 1 followed by 1 p.o. days 2 through 5.  Hycodan 1 teaspoon p.o. every 8 hours sparingly for cough.  Rest and drink plenty of fluids.  Call if symptoms worsen or fail to improve.

## 2018-11-10 NOTE — Patient Instructions (Signed)
Continue current medications and return in 1 year or as needed.  Follow-up regarding osteoporosis with endocrinologist.  Return for pneumococcal vaccine in the near future.

## 2018-11-10 NOTE — Progress Notes (Signed)
   Subjective:    Patient ID: Mary Garcia, female    DOB: Mar 09, 1946, 73 y.o.   MRN: [id number]  HPI 73 year old Female with history of asthma in with cough and nasal congestion.  No respiratory distress.  No recent travel history.  No fever or chills.  No myalgias.  Did receive flu vaccine.   Review of Systems     Objective:   Physical Exam Temperature 98.5 degrees blood pressure 122/70 pulse oximetry 98%. Pharynx slightly injected.  TMs are clear.  Neck is supple without significant adenopathy.  Chest clear to auscultation without rales or wheezing.      Assessment & Plan:  History of asthma  Acute lower respiratory infection  Plan: Zithromax Z-PAK take 2 p.o. day 1 followed by 1 p.o. days 2 through 5.  Hycodan 1 teaspoon p.o. every 8 hours as needed cough.  Rest and drink plenty of fluids.  Call if symptoms worsen.

## 2018-11-26 ENCOUNTER — Encounter: Payer: Self-pay | Admitting: Internal Medicine

## 2018-11-26 ENCOUNTER — Ambulatory Visit (INDEPENDENT_AMBULATORY_CARE_PROVIDER_SITE_OTHER): Payer: PPO | Admitting: Internal Medicine

## 2018-11-26 ENCOUNTER — Other Ambulatory Visit: Payer: Self-pay

## 2018-11-26 DIAGNOSIS — Z8639 Personal history of other endocrine, nutritional and metabolic disease: Secondary | ICD-10-CM | POA: Diagnosis not present

## 2018-11-26 DIAGNOSIS — M810 Age-related osteoporosis without current pathological fracture: Secondary | ICD-10-CM
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
## 2018-11-26 NOTE — Patient Instructions (Signed)
Please have another vitamin D level checked within the next 2-4 months.  Please contact me around mid 06/2019 to order your next bone density scan.  Please come back for a follow-up appointment in 1 year.

## 2018-12-24 ENCOUNTER — Other Ambulatory Visit: Payer: Self-pay

## 2018-12-24 ENCOUNTER — Other Ambulatory Visit: Payer: Self-pay | Admitting: Internal Medicine

## 2018-12-24 MED ORDER — IBANDRONATE SODIUM 150 MG PO TABS: 150.0000 mg | ORAL_TABLET | ORAL | 5 refills | Status: DC

## 2018-12-24 NOTE — Telephone Encounter (Signed)
Received Fax RX request from  [facility]  Medication - ibandronate (BONIVA) 150 MG tablet   Last Refill - 3.16.20  Last OV - 3.24.20  Last CPE - 2.11.20

## 2019-01-08 ENCOUNTER — Ambulatory Visit (INDEPENDENT_AMBULATORY_CARE_PROVIDER_SITE_OTHER): Payer: PPO | Admitting: Internal Medicine

## 2019-01-08 ENCOUNTER — Telehealth: Payer: Self-pay

## 2019-01-08 DIAGNOSIS — R51 Headache: Secondary | ICD-10-CM | POA: Diagnosis not present

## 2019-01-08 DIAGNOSIS — R519 Headache, unspecified: Secondary | ICD-10-CM

## 2019-01-08 MED ORDER — PREDNISONE 10 MG PO TABS: ORAL_TABLET | ORAL | 0 refills | Status: DC

## 2019-01-08 MED ORDER — HYDROCODONE-ACETAMINOPHEN 10-325 MG PO TABS: 1.0000 | ORAL_TABLET | Freq: Four times a day (QID) | ORAL | 0 refills | Status: AC | PRN

## 2019-01-08 NOTE — Patient Instructions (Signed)
Take prednisone in tapering course as directed going from 60 mg to 0 mg over 7 days.  Norco 10/325 to take 1/2-1 whole tab with food sparingly q 6 hours as needed for control of headache.  If symptoms persist will get CT of the brain with contrast.  Patient's tetanus immunization is up-to-date.

## 2019-01-08 NOTE — Progress Notes (Signed)
   Subjective:    Patient ID: Mary Garcia, female    DOB: [birth year], 73 y.o.   MRN: [id number]  HPI 73 year old Female  Seen by interactive audio and video telecommunications today due to the coronavirus pandemic.  Patient is agreeable to visit in this format today.  She is identified as Mary Moan. Garcia using 2 identifiers and is a patient in this practice.  Patient indicates for the past couple of weeks she has had an occipital headache which she thought was a migraine headache.  She apparently had some leftover narcotic medication and she would take a small piece of a tablet for pain relief.  Headache was dull some but did not completely go away.  She did not have nausea or vomiting with the headache.  Had some soreness in her neck.  Had some soreness in her neck.  Denied fever or chills.  Thought maybe she had a sinus infection but did not have maxillary sinus tenderness or respiratory symptoms at all.  She is alarmed because her father died of a brain tumor.   Checked tetanus immunization at her request. Up to date last received 2014.  Apparently had mild injury with rusty nail recently.  Review of Systems see above.  No dizziness.  No ataxia.  No visual disturbances.     Objective:   Physical Exam Patient is alert and oriented x3.  She has no evidence of facial weakness.  She is coherent and gives a clear history.  No gross focal deficits on brief neurological exam which is limited due to this format       Assessment & Plan:  Protracted occipital headache likely a muscle contraction headache given the location  Plan: Patient will be placed on prednisone 10 mg (#21) going from 60 mg to 0 mg over 7 days to break the headache cycle.  She was also given Norco 10/325 to take 1/2-1 whole tablet sparingly every 6 hours with food as needed headache.  If headache persist, we will certainly consider getting a CT scan with contrast for further evaluation.  Patient seems comfortable with this plan.   25 minutes spent with patient including  reviewing records, sending in narcotic pain medication via [employer], history taking, diagnosis and medical decision making in prescribing treatment.

## 2019-01-08 NOTE — Telephone Encounter (Signed)
Set [place] Doxy visit

## 2019-01-08 NOTE — Telephone Encounter (Signed)
Scheduled virtual visit °

## 2019-01-08 NOTE — Telephone Encounter (Signed)
Patient called with a headache that has been going on for a couple of weeks now it comes and goes she said it feels like a migraine but she also thinks it could be sinusitis related. She said she does not have a fever and has not traveled. She would like to be seen for that. Virtual visit?

## 2019-01-12 ENCOUNTER — Encounter: Payer: Self-pay | Admitting: Internal Medicine

## 2019-01-16 ENCOUNTER — Other Ambulatory Visit: Payer: Self-pay | Admitting: Internal Medicine

## 2019-02-01 ENCOUNTER — Other Ambulatory Visit: Payer: PPO | Admitting: Internal Medicine

## 2019-02-01 ENCOUNTER — Other Ambulatory Visit: Payer: Self-pay

## 2019-02-01 DIAGNOSIS — E559 Vitamin D deficiency, unspecified: Secondary | ICD-10-CM

## 2019-02-01 NOTE — Progress Notes (Signed)
Lab only 

## 2019-02-01 NOTE — Addendum Note (Signed)
Addended by: Wynonia Musty A on: 02/01/2019 02:34 PM   Modules accepted: Orders

## 2019-02-02 LAB — VITAMIN D 25 HYDROXY (VIT D DEFICIENCY, FRACTURES): Vit D, 25-Hydroxy: 44 ng/mL (ref 30–100)

## 2019-03-05 ENCOUNTER — Telehealth: Payer: Self-pay | Admitting: Internal Medicine

## 2019-03-05 NOTE — Telephone Encounter (Signed)
Refer to [facility]

## 2019-03-05 NOTE — Telephone Encounter (Signed)
Referral done, called and let Tenee know and gave her phone number.

## 2019-03-05 NOTE — Telephone Encounter (Signed)
Koral Thaden [phone]  Reagen called to say she would like to get a referral to an allergist, preferably in the [facility] system. She has a dry cough, head feels full and stuffy, she is having to use her Albuterol inhaler the max of 4 times a day. She would like find out why.

## 2019-03-14 ENCOUNTER — Other Ambulatory Visit: Payer: Self-pay

## 2019-03-18 ENCOUNTER — Other Ambulatory Visit: Payer: Self-pay | Admitting: Internal Medicine

## 2019-03-27 ENCOUNTER — Encounter: Payer: Self-pay | Admitting: Allergy

## 2019-03-27 ENCOUNTER — Ambulatory Visit (INDEPENDENT_AMBULATORY_CARE_PROVIDER_SITE_OTHER): Payer: PPO | Admitting: Allergy

## 2019-03-27 ENCOUNTER — Other Ambulatory Visit: Payer: Self-pay

## 2019-03-27 VITALS — BP 142/90 | HR 79 | Temp 97.8°F | Resp 16 | Ht 62.0 in | Wt 134.2 lb

## 2019-03-27 DIAGNOSIS — J4530 Mild persistent asthma, uncomplicated: Secondary | ICD-10-CM

## 2019-03-27 DIAGNOSIS — J3089 Other allergic rhinitis: Secondary | ICD-10-CM

## 2019-03-27 MED ORDER — ARNUITY ELLIPTA 100 MCG/ACT IN AEPB: 1.0000 | INHALATION_SPRAY | Freq: Every day | RESPIRATORY_TRACT | 3 refills | Status: DC

## 2019-03-27 MED ORDER — AZELASTINE HCL 0.1 % NA SOLN: 1.0000 | Freq: Two times a day (BID) | NASAL | 5 refills | Status: DC | PRN

## 2019-03-27 NOTE — Assessment & Plan Note (Addendum)
Issues with coughing, wheezing and SOB for the past 3-4 years with worsening during URIs. Current coughing episode started in May. Using albuterol a few times per week with good benefit. Used to be on Flovent, Qvar in the past. Tried PPIs in the past with no benefit.   Today's skin testing showed: Borderline positive to maple tree pollen which pollinates in the spring months. This explains her worsening coughing which started in May.   Today's breathing test showed: mild possible restrictive disease with 14% improvement in FEV post bronchodilator treatment.  . Daily controller medication(s): start Arnuity 100 1 puff once a day and rinse mouth afterwards. Monitor symptoms and albuterol use.  . Prior to physical activity: May use albuterol rescue inhaler 2 puffs 5 to 15 minutes prior to strenuous physical activities. Marland Kitchen Rescue medications: May use albuterol rescue inhaler 2 puffs or nebulizer every 4 to 6 hours as needed for shortness of breath, chest tightness, coughing, and wheezing. Monitor frequency of use.

## 2019-03-27 NOTE — Progress Notes (Signed)
New Patient Note  RE: Mary Garcia MRN: [id number] DOB: 1946-03-10 Date of Office Visit: 03/27/2019  Referring provider: Elby Showers, MD Primary care provider: Elby Showers, MD  Chief Complaint: summer cold that won't go away (happens in may, dry cough, using pro air respiclick, seen pulmonology, )  History of Present Illness: I had the pleasure of seeing Mary Garcia for initial evaluation at the [facility] on 03/27/2019. She is a 73 y.o. female, who is referred here by Elby Showers, MD for the evaluation of coughing.   She reports symptoms of shortness of breath, dry coughing, wheezing for 3+ years and would flare in the winter months. Current cough started in May. Current medications include albuterol prn which help. She reports not using aerochamber with inhalers. She tried the following inhalers: Flovent, Qvar. Main triggers are unsure. In the last month, frequency of symptoms: few times/week. Frequency of nocturnal symptoms: 0x/month. Frequency of SABA use: 1-2x/week. Interference with physical activity: no. Sleep is undisturbed. In the last 12 months, emergency room visits/urgent care visits/doctor office visits or hospitalizations due to respiratory issues: 1-2. In the last 12 months, oral steroids courses: 1. Lifetime history of hospitalization for respiratory issues: 0. Prior intubations: 0. History of pneumonia: no. She was evaluated by pulmonologist in the past. Smoking exposure: only in her 65s. Up to date with flu vaccine: yes. Up to date with pneumonia vaccine: yes.  History of reflux: no - tried Protonix with no benefit.   11/04/2016 CXR: IMPRESSION: No edema or consolidation.  She reports symptoms of rhinorrhea. Symptoms have been going on for 8 years. The symptoms are present all year around. Anosmia: no. Headache: no. She has used zyrtec with fair improvement in symptoms. Sinus infections: not in the past 3-4 years. Previous work up includes: none.  Previous ENT evaluation: yes but not recently. CT sinus 11/05/2014 IMPRESSION: No evidence of sinusitis. Last eye exam: no. History of nasal polyps: no.  Assessment and Plan: Yailen is a 73 y.o. female with: Asthma Issues with coughing, wheezing and SOB for the past 3-4 years with worsening during URIs. Current coughing episode started in May. Using albuterol a few times per week with good benefit. Used to be on Flovent, Qvar in the past. Tried PPIs in the past with no benefit.   Today's skin testing showed: Borderline positive to maple tree pollen which pollinates in the spring months. This explains her worsening coughing which started in May.   Today's breathing test showed: mild possible restrictive disease with 14% improvement in FEV post bronchodilator treatment.  . Daily controller medication(s): start Arnuity 100 1 puff once a day and rinse mouth afterwards. Monitor symptoms and albuterol use.  . Prior to physical activity: May use albuterol rescue inhaler 2 puffs 5 to 15 minutes prior to strenuous physical activities. Marland Kitchen Rescue medications: May use albuterol rescue inhaler 2 puffs or nebulizer every 4 to 6 hours as needed for shortness of breath, chest tightness, coughing, and wheezing. Monitor frequency of use.   Other allergic rhinitis Perennial rhinitis symptoms for the past 8 years. No recent ENT evaluation. Tried zyrtec with some benefit.   Today's skin testing was borderline positive to tree pollen.   Start environmental control measures.   Start azelastine nasal spray 1-2 sprays per nostril 1-2 times a day as needed for runny nose.  Return in about 2 months (around 05/27/2019).  Meds ordered this encounter  Medications  . Fluticasone Furoate (ARNUITY  ELLIPTA) 100 MCG/ACT AEPB    Sig: Inhale 1 puff into the lungs daily.    Dispense:  30 each    Refill:  3  . azelastine (ASTELIN) 0.1 % nasal spray    Sig: Place 1 spray into both nostrils 2 (two) times daily as needed for  rhinitis. Use in each nostril as directed    Dispense:  30 mL    Refill:  5   Lab Orders  No laboratory test(s) ordered today    Other allergy screening: Food allergy: no Medication allergy: no Hymenoptera allergy: no Urticaria: no Eczema:no History of recurrent infections suggestive of immunodeficency: no  Diagnostics: Spirometry:  Tracings reviewed. Her effort: Good reproducible efforts. FVC: 2.16L FEV1: 1.48L, 72% predicted FEV1/FVC ratio: 69% Interpretation: Spirometry consistent with possible restrictive disease. 14% improvement in FEV post bronchodilator treatment.  Please see scanned spirometry results for details.  Skin Testing: Environmental allergy panel and select foods. Positive test to: borderline to tree pollen. Results discussed with patient/family. Airborne Adult Perc - 03/27/19 1431    Time Antigen Placed  1431    Allergen Manufacturer  Lavella Hammock    Location  Back    Number of Test  59    1. Control-Buffer 50% Glycerol  Negative    2. Control-Histamine 1 mg/ml  2+    3. Albumin saline  Negative    4. [place]  Negative    5. [place]  Negative    6. Johnson  Negative    7. Glenmora Blue  Negative    8. Meadow Fescue  Negative    9. Perennial Rye  Negative    10. Sweet Vernal  Negative    11. Timothy  Negative    12. Cocklebur  Negative    13. Burweed Marshelder  Negative    14. Ragweed, short  Negative    15. Ragweed, Giant  Negative    16. Plantain,  English  Negative    17. Lamb's Quarters  Negative    18. Sheep Sorrell  Negative    19. Rough Pigweed  Negative    20. Marsh Elder, Rough  Negative    21. Mugwort, Common  Negative    22. Ash mix  Negative    23. Birch mix  Negative    24. Beech American  Negative    25. Box, Elder  Negative    26. Cedar, red  Negative    27. Cottonwood, [place]  Negative    28. Elm mix  Negative    29. Hickory mix  Negative    30. Maple mix  2+    31. Oak, [place] mix  Negative    32. Pecan Pollen  Negative     33. Pine mix  Negative    34. Sycamore Eastern  Negative    35. Snead, Black Pollen  Negative    36. Alternaria alternata  Negative    37. Cladosporium Herbarum  Negative    38. Aspergillus mix  Negative    39. Penicillium mix  Negative    40. Bipolaris sorokiniana (Helminthosporium)  Negative    41. Drechslera spicifera (Curvularia)  Negative    42. Mucor plumbeus  Negative    43. Fusarium moniliforme  Negative    44. Aureobasidium pullulans (pullulara)  Negative    45. Rhizopus oryzae  Negative    46. Botrytis cinera  Negative    47. Epicoccum nigrum  Negative    48. Phoma betae  Negative    49.  Candida Albicans  Negative    50. Trichophyton mentagrophytes  Negative    51. Mite, D Farinae  5,000 AU/ml  Negative    52. Mite, D Pteronyssinus  5,000 AU/ml  Negative    53. Cat Hair 10,000 BAU/ml  Negative    54.  Dog Epithelia  Negative    55. Mixed Feathers  Negative    56. Horse Epithelia  Negative    57. Cockroach, German  Negative    58. Mouse  Negative    59. Tobacco Leaf  Negative     Food Perc - 03/27/19 1431    Time Antigen Placed  1431    Allergen Manufacturer  Lavella Hammock    Location  Back    Number of allergen test  10    1. Peanut  Negative    2. Soybean food  Negative    3. Wheat, whole  Negative    4. Sesame  Negative    5. Milk, cow  Negative    6. Egg White, chicken  Negative    7. Casein  Negative    8. Shellfish mix  Negative    9. Fish mix  Negative    10. Cashew  Negative     Intradermal - 03/27/19 1505    Time Antigen Placed  1505    Allergen Manufacturer  Lavella Hammock    Location  Arm    Number of Test  15    Intradermal  Select    Control  Negative    Guatemala  Negative    Johnson  Negative    7 Grass  Negative    Ragweed mix  Negative    Mikhail mix  Negative    Tree mix  Negative    Mold 1  Negative    Mold 2  Negative    Mold 3  Negative    Mold 4  Negative    Cat  Negative    Dog  Negative    Cockroach  Negative    Mite mix  Negative        Past Medical History: Patient Active Problem List   Diagnosis Date Noted  . Other allergic rhinitis 03/27/2019  . DDD (degenerative disc disease), cervical 09/15/2017  . Asthma with acute exacerbation 07/07/2016  . Depression 02/02/2016  . Arthropathy of right shoulder 02/02/2016  . Asthma 11/17/2014  . Insomnia 04/13/2011  . Osteoporosis 04/13/2011  . Biliary dyskinesia 04/13/2011  . Osteoarthritis 04/13/2011  . History of migraine headaches 04/13/2011  . Vitamin D deficiency 04/13/2011  . Anxiety 04/13/2011  . Irritable bowel syndrome 04/13/2011   Past Medical History:  Diagnosis Date  . Anxiety   . Arthritis   . Bronchitis, acute    Past Surgical History: Past Surgical History:  Procedure Laterality Date  . BREAST ENHANCEMENT SURGERY    . FACELIFT     local  . TIBIA FRACTURE SURGERY  2004   left leg x3, tibia and fibula  . TUMOR EXCISION  2003   right muscle back, benign   Medication List:  Current Outpatient Medications  Medication Sig Dispense Refill  . ELDERBERRY PO Take by mouth.    . ibandronate (BONIVA) 150 MG tablet Take 1 tablet (150 mg total) by mouth every 30 (thirty) days. Take in the morning with a full glass of water, on an empty stomach, and do not take anything else by mouth or lie down for the next 30 min. 1 tablet 5  . PROAIR RESPICLICK  108 (90 Base) MCG/ACT AEPB INHALE 2 PUFFS BY MOUTH 4 TIMES DAILY 1 each prn  . Vitamin D, Cholecalciferol, 1000 UNITS TABS Take 1 tablet by mouth daily.    Marland Kitchen zolpidem (AMBIEN) 10 MG tablet TAKE 1 TABLET BY MOUTH EVERY DAY AT BEDTIME AS NEEDED FOR SLEEP 90 tablet 1  . ALPRAZolam (XANAX) 0.25 MG tablet One po tid prn (Patient not taking: Reported on 03/27/2019) 30 tablet 1  . azelastine (ASTELIN) 0.1 % nasal spray Place 1 spray into both nostrils 2 (two) times daily as needed for rhinitis. Use in each nostril as directed 30 mL 5  . dicyclomine (BENTYL) 20 MG tablet Take 1 tablet (20 mg total) by mouth every 6 (six) hours.  (Patient not taking: Reported on 03/27/2019) 30 tablet 1  . diphenhydramine-acetaminophen (TYLENOL PM) 25-500 MG TABS Take 1 tablet by mouth at bedtime as needed (pain).    Marland Kitchen FLOVENT HFA 220 MCG/ACT inhaler INHALE 2 PUFFS INTO THE LUNGS TWICE DAILY (Patient not taking: Reported on 03/27/2019) 12 g 5  . Fluticasone Furoate (ARNUITY ELLIPTA) 100 MCG/ACT AEPB Inhale 1 puff into the lungs daily. 30 each 3   No current facility-administered medications for this visit.    Allergies: No Known Allergies Social History: Social History   Socioeconomic History  . Marital status: Married    Spouse name: Not on file  . Number of children: 1  . Years of education: Not on file  . Highest education level: Not on file  Occupational History  . Occupation: retired  Scientific laboratory technician  . Financial resource strain: Not on file  . Food insecurity    Worry: Not on file    Inability: Not on file  . Transportation needs    Medical: Not on file    Non-medical: Not on file  Tobacco Use  . Smoking status: Former Smoker    Packs/day: 0.40    Years: 15.00    Pack years: 6.00    Types: Cigarettes    Quit date: 08/15/1978    Years since quitting: 40.6  . Smokeless tobacco: Never Used  . Tobacco comment: smokes 5 cigs daily when smoking  Substance and Sexual Activity  . Alcohol use: Yes    Alcohol/week: 2.0 standard drinks    Types: 2 Glasses of wine per week    Comment: 2-3 times a week  . Drug use: No  . Sexual activity: Not on file  Lifestyle  . Physical activity    Days per week: Not on file    Minutes per session: Not on file  . Stress: Not on file  Relationships  . Social Herbalist on phone: Not on file    Gets together: Not on file    Attends religious service: Not on file    Active member of club or organization: Not on file    Attends meetings of clubs or organizations: Not on file    Relationship status: Not on file  Other Topics Concern  . Not on file  Social History Narrative   . Not on file   Lives in a house. [place].  Smoking: in her 71s Occupation: retired  Programme researcher, broadcasting/film/video HistoryFreight forwarder in the house: no Charity fundraiser in the family room: no Carpet in the bedroom: yes Heating: electric Cooling: central Pet: yes 1 dog x 2 months  Family History: Family History  Problem Relation Age of Onset  . Asthma Mother   .  Brain cancer Father   . Colon cancer Neg Hx    Review of Systems  Constitutional: Negative for appetite change, chills, fever and unexpected weight change.  HENT: Positive for rhinorrhea. Negative for congestion.   Eyes: Negative for itching.  Respiratory: Positive for cough and shortness of breath. Negative for chest tightness and wheezing.   Cardiovascular: Negative for chest pain.  Gastrointestinal: Negative for abdominal pain.  Genitourinary: Negative for difficulty urinating.  Skin: Negative for rash.  Allergic/Immunologic: Negative for environmental allergies and food allergies.  Neurological: Negative for headaches.   Objective: BP (!) 142/90 (BP Location: Left Arm, Patient Position: Sitting, Cuff Size: Normal)   Pulse 79   Temp 97.8 F (36.6 C) (Temporal)   Resp 16   Ht 5\' 2"  (1.575 m)   Wt 134 lb 3.2 oz (60.9 kg)   SpO2 95%   BMI 24.55 kg/m  Body mass index is 24.55 kg/m. Physical Exam  Constitutional: She is oriented to person, place, and time. She appears well-developed and well-nourished.  HENT:  Head: Normocephalic and atraumatic.  Right Ear: External ear normal.  Left Ear: External ear normal.  Nose: Nose normal.  Mouth/Throat: Oropharynx is clear and moist.  Eyes: Conjunctivae and EOM are normal.  Neck: Neck supple.  Cardiovascular: Normal rate, regular rhythm and normal heart sounds. Exam reveals no gallop and no friction rub.  No murmur heard. Pulmonary/Chest: Effort normal and breath sounds normal. She has no wheezes. She has no rales.  Abdominal: Soft.   Lymphadenopathy:    She has no cervical adenopathy.  Neurological: She is alert and oriented to person, place, and time.  Skin: Skin is warm. No rash noted.  Psychiatric: She has a normal mood and affect. Her behavior is normal.  Nursing note and vitals reviewed.  The plan was reviewed with the patient/family, and all questions/concerned were addressed.  It was my pleasure to see Kalianna today and participate in her care. Please feel free to contact me with any questions or concerns.  Sincerely,  Rexene Alberts, DO Allergy & Immunology  [facility] office: [phone] [facility] office: [place] office: [phone]

## 2019-03-27 NOTE — Assessment & Plan Note (Signed)
Perennial rhinitis symptoms for the past 8 years. No recent ENT evaluation. Tried zyrtec with some benefit.   Today's skin testing was borderline positive to tree pollen.   Start environmental control measures.   Start azelastine nasal spray 1-2 sprays per nostril 1-2 times a day as needed for runny nose.

## 2019-03-27 NOTE — Patient Instructions (Addendum)
Today's skin testing showed: Borderline positive to maple tree pollen. Start environmental control measures.  Start azelastine nasal spray 1-2 sprays per nostril 1-2 times a day as needed for runny nose.  Breathing:  . Daily controller medication(s): start Arnuity 100 1 puff once a day and rinse mouth afterwards.  . Prior to physical activity: May use albuterol rescue inhaler 2 puffs 5 to 15 minutes prior to strenuous physical activities. Marland Kitchen Rescue medications: May use albuterol rescue inhaler 2 puffs or nebulizer every 4 to 6 hours as needed for shortness of breath, chest tightness, coughing, and wheezing. Monitor frequency of use.  . Control goals:  o Full participation in all desired activities (may need albuterol before activity) o Albuterol use two times or less a week on average (not counting use with activity) o Cough interfering with sleep two times or less a month o Oral steroids no more than once a year o No hospitalizations  Follow up in 2 months  Reducing Pollen Exposure . Pollen seasons: trees (spring), grass (summer) and ragweed/weeds (fall). Marland Kitchen Keep windows closed in your home and car to lower pollen exposure.  Susa Simmonds air conditioning in the bedroom and throughout the house if possible.  . Avoid going out in dry windy days - especially early morning. . Pollen counts are highest between 5 - 10 AM and on dry, hot and windy days.  . Save outside activities for late afternoon or after a heavy rain, when pollen levels are lower.  . Avoid mowing of grass if you have grass pollen allergy. Marland Kitchen Be aware that pollen can also be transported indoors on people and pets.  . Dry your clothes in an automatic dryer rather than hanging them outside where they might collect pollen.  . Rinse hair and eyes before bedtime.

## 2019-04-02 DIAGNOSIS — L821 Other seborrheic keratosis: Secondary | ICD-10-CM | POA: Diagnosis not present

## 2019-04-02 DIAGNOSIS — B359 Dermatophytosis, unspecified: Secondary | ICD-10-CM | POA: Diagnosis not present

## 2019-04-02 DIAGNOSIS — B353 Tinea pedis: Secondary | ICD-10-CM | POA: Diagnosis not present

## 2019-05-10 ENCOUNTER — Telehealth: Payer: Self-pay | Admitting: Internal Medicine

## 2019-05-10 NOTE — Telephone Encounter (Signed)
error 

## 2019-05-13 ENCOUNTER — Other Ambulatory Visit: Payer: Self-pay

## 2019-05-13 ENCOUNTER — Ambulatory Visit (INDEPENDENT_AMBULATORY_CARE_PROVIDER_SITE_OTHER): Payer: PPO | Admitting: Internal Medicine

## 2019-05-13 ENCOUNTER — Encounter: Payer: Self-pay | Admitting: Internal Medicine

## 2019-05-13 DIAGNOSIS — Z86018 Personal history of other benign neoplasm: Secondary | ICD-10-CM | POA: Diagnosis not present

## 2019-05-13 DIAGNOSIS — Z23 Encounter for immunization: Secondary | ICD-10-CM | POA: Diagnosis not present

## 2019-05-13 DIAGNOSIS — M549 Dorsalgia, unspecified: Secondary | ICD-10-CM | POA: Diagnosis not present

## 2019-05-13 MED ORDER — GABAPENTIN 300 MG PO CAPS: 300.0000 mg | ORAL_CAPSULE | Freq: Three times a day (TID) | ORAL | 3 refills | Status: DC

## 2019-05-13 NOTE — Patient Instructions (Addendum)
Flu vaccine given.  Referral to neurology for evaluation of right parathoracic back pain.

## 2019-05-13 NOTE — Progress Notes (Signed)
   Subjective:    Patient ID: Sarajane Jews, female    DOB: Dec 17, 1945, 73 y.o.   MRN: [id number]  HPI 73 year Female with right parathoracic back pain.  Hurts to lean forward to ride bike for extended periods of time.  Walking 2-4 miles a day and does OK. Strenuous hiking bothers the right parathoracic area.  Patient is concerned there may be something wrong with her right parathoracic area.  Had a schwannoma removed there in 2003.  She has a history of osteoporosis and has been on Boniva.  Not aware of any recent injury to that area.  Hard to get any relief.  Talked about physical therapy but she is not really willing to do that at this point in time.  She wants to be reassured that nothing is wrong.  She is worried about a recurrence of the schwannoma.      Review of Systems see above-no issues with paresthesias in arms or legs.     Objective:   Physical Exam Blood pressure 110/70 pulse 66 weight 134 pounds.  She has a long linear scar in the right parathoracic area that is well-healed.  To the left of that area is some prominence of the musculature that is firm and tender.       Assessment & Plan:  Right parathoracic back pain  History of schwannoma removed in 2003  Former smoker  Plan: It is probably best if she has Neurology referral at this point in time because of concerns she has.  I think this is probably musculoskeletal pain but she has a lot of anxiety and needs some answers.

## 2019-05-29 ENCOUNTER — Ambulatory Visit: Payer: PPO | Admitting: Allergy

## 2019-06-03 ENCOUNTER — Ambulatory Visit (INDEPENDENT_AMBULATORY_CARE_PROVIDER_SITE_OTHER): Payer: PPO | Admitting: Allergy

## 2019-06-03 ENCOUNTER — Encounter: Payer: Self-pay | Admitting: Allergy

## 2019-06-03 ENCOUNTER — Other Ambulatory Visit: Payer: Self-pay

## 2019-06-03 VITALS — BP 118/80 | Temp 98.1°F | Resp 18

## 2019-06-03 DIAGNOSIS — J3089 Other allergic rhinitis: Secondary | ICD-10-CM | POA: Diagnosis not present

## 2019-06-03 DIAGNOSIS — J4530 Mild persistent asthma, uncomplicated: Secondary | ICD-10-CM | POA: Diagnosis not present

## 2019-06-03 NOTE — Assessment & Plan Note (Signed)
Past history - Issues with coughing, wheezing and SOB for the past 3-4 years with worsening during URIs. Current coughing episode started in May. Using albuterol a few times per week with good benefit. Used to be on Flovent, Qvar in the past. Tried PPIs in the past with no benefit. 2020 skin testing showed: Borderline positive to maple tree pollen which pollinates in the spring months. This explains her worsening coughing which started in May. 2020 spirometry showed: mild possible restrictive disease with 14% improvement in FEV post bronchodilator treatment.  Interim history - No noticeable improvement with Arnuity.  Today's spirometry improved from previous but still showed some mild obstruction.   Daily controller medication(s):start Symbicort 80 1 puff twice a day with spacer and rinse mouth afterwards. Spacer and sample given. This replaces Arnuity for now.   Monitor symptoms and albuterol use.   Give update in 1 month. If doing better will continue with Symbicort.  Prior to physical activity:May use albuterol rescue inhaler 2 puffs 5 to 15 minutes prior to strenuous physical activities.  Rescue medications:May use albuterol rescue inhaler 2 puffs or nebulizer every 4 to 6 hours as needed for shortness of breath, chest tightness, coughing, and wheezing. Monitor frequency of use.   Repeat spirometry at next visit.

## 2019-06-03 NOTE — Assessment & Plan Note (Signed)
Past history - Perennial rhinitis symptoms for the past 8 years. No recent ENT evaluation. Tried zyrtec with some benefit. 2020 skin testing was borderline positive to tree pollen.  Interim history - Doing well and asymptomatic with no meds.  Continue environmental control measures.   May use azelastine nasal spray 1-2 sprays per nostril 1-2 times a day as needed for runny nose.  May use over the counter antihistamines such as Zyrtec (cetirizine), Claritin (loratadine), Allegra (fexofenadine), or Xyzal (levocetirizine) daily as needed.

## 2019-06-03 NOTE — Patient Instructions (Addendum)
Asthma:   Daily controller medication(s):start Symbicort 80 1 puff twice a day with spacer and rinse mouth afterwards. Spacer and sample given. This replaces Arnuity for now.   Monitor symptoms and albuterol use.   Prior to physical activity:May use albuterol rescue inhaler 2 puffs 5 to 15 minutes prior to strenuous physical activities.  Rescue medications:May use albuterol rescue inhaler 2 puffs or nebulizer every 4 to 6 hours as needed for shortness of breath, chest tightness, coughing, and wheezing. Monitor frequency of use.  Asthma control goals:  Full participation in all desired activities (may need albuterol before activity) Albuterol use two times or less a week on average (not counting use with activity) Cough interfering with sleep two times or less a month Oral steroids no more than once a year No hospitalizations  Allergic rhinitis:  2020 skin testing was borderline positive to tree pollen.   Continue environmental control measures.   May use azelastine nasal spray 1-2 sprays per nostril 1-2 times a day as needed for runny nose.  Follow up in 2 months or sooner if needed. Recommend annual flu vaccine.   Call in 1 month and let me know if the Symbicort works better than Arnuity.

## 2019-06-03 NOTE — Progress Notes (Signed)
Follow Up Note  RE: HEAVEN ENRIQUES MRN: [id number] DOB: [birth year] Date of Office Visit: 06/03/2019  Referring provider: Elby Showers, MD Primary care provider: Elby Showers, MD  Chief Complaint: Follow-up  History of Present Illness: I had the pleasure of seeing Adoria Klauss for a follow up visit at the [facility] on 06/03/2019. She is a 73 y.o. female, who is being followed for asthma, allergic rhinitis. Today she is here for regular follow up visit. Her previous allergy office visit was on 03/27/2019 with Dr. Maudie Mercury.   Asthma Currently using Arnuity 1 puff daily with no noticeable benefit. Using albuterol prior to exertion with good benefit.  Few wheezing episodes when out and about and used albuterol with good benefit.  Patient [place] She [place] hesitant to go and also a family relative passed away in [place] recently.   Other allergic rhinitis  Minimal symptoms and not using any nasal sprays.   Assessment and Plan: Chanel is a 73 y.o. female with: Mild persistent asthma without complication Past history - Issues with coughing, wheezing and SOB for the past 3-4 years with worsening during URIs. Current coughing episode started in May. Using albuterol a few times per week with good benefit. Used to be on Flovent, Qvar in the past. Tried PPIs in the past with no benefit. 2020 skin testing showed: Borderline positive to maple tree pollen which pollinates in the spring months. This explains her worsening coughing which started in May. 2020 spirometry showed: mild possible restrictive disease with 14% improvement in FEV post bronchodilator treatment.  Interim history - No noticeable improvement with Arnuity.  Today's spirometry improved from previous but still showed some mild obstruction.   Daily controller medication(s):start Symbicort 80 1 puff twice a day with spacer and rinse mouth afterwards. Spacer and sample  given. This replaces Arnuity for now.   Monitor symptoms and albuterol use.   Give update in 1 month. If doing better will continue with Symbicort.  Prior to physical activity:May use albuterol rescue inhaler 2 puffs 5 to 15 minutes prior to strenuous physical activities.  Rescue medications:May use albuterol rescue inhaler 2 puffs or nebulizer every 4 to 6 hours as needed for shortness of breath, chest tightness, coughing, and wheezing. Monitor frequency of use.   Repeat spirometry at next visit.   Other allergic rhinitis Past history - Perennial rhinitis symptoms for the past 8 years. No recent ENT evaluation. Tried zyrtec with some benefit. 2020 skin testing was borderline positive to tree pollen.  Interim history - Doing well and asymptomatic with no meds.  Continue environmental control measures.   May use azelastine nasal spray 1-2 sprays per nostril 1-2 times a day as needed for runny nose.  May use over the counter antihistamines such as Zyrtec (cetirizine), Claritin (loratadine), Allegra (fexofenadine), or Xyzal (levocetirizine) daily as needed.  Return in about 2 months (around 08/03/2019).  Diagnostics: Spirometry:  Tracings reviewed. Her effort: Good reproducible efforts. FVC: 2.37L FEV1: 1.62L, 79% predicted FEV1/FVC ratio: 68% Interpretation: Spirometry consistent with mild obstruction Please see scanned spirometry results for details.  Medication List:  Current Outpatient Medications  Medication Sig Dispense Refill  . ALPRAZolam (XANAX) 0.25 MG tablet One po tid prn 30 tablet 1  . azelastine (ASTELIN) 0.1 % nasal spray Place 1 spray into both nostrils 2 (two) times daily as needed for rhinitis. Use in each nostril as directed 30 mL 5  .  diphenhydramine-acetaminophen (TYLENOL PM) 25-500 MG TABS Take 1 tablet by mouth at bedtime as needed (pain).    Marland Kitchen ELDERBERRY PO Take by mouth.    . Fluticasone Furoate (ARNUITY ELLIPTA) 100 MCG/ACT AEPB Inhale 1 puff into the  lungs daily. 30 each 3  . ibandronate (BONIVA) 150 MG tablet Take 1 tablet (150 mg total) by mouth every 30 (thirty) days. Take in the morning with a full glass of water, on an empty stomach, and do not take anything else by mouth or lie down for the next 30 min. 1 tablet 5  . PROAIR RESPICLICK 123XX123 (90 Base) MCG/ACT AEPB INHALE 2 PUFFS BY MOUTH 4 TIMES DAILY 1 each prn  . Vitamin D, Cholecalciferol, 1000 UNITS TABS Take 1 tablet by mouth daily.    Marland Kitchen zolpidem (AMBIEN) 10 MG tablet TAKE 1 TABLET BY MOUTH EVERY DAY AT BEDTIME AS NEEDED FOR SLEEP 90 tablet 1   No current facility-administered medications for this visit.    Allergies: No Known Allergies I reviewed her past medical history, social history, family history, and environmental history and no significant changes have been reported from her previous visit.  Review of Systems  Constitutional: Negative for appetite change, chills, fever and unexpected weight change.  HENT: Negative for congestion and rhinorrhea.   Eyes: Negative for itching.  Respiratory: Negative for cough, chest tightness, shortness of breath and wheezing.   Cardiovascular: Negative for chest pain.  Gastrointestinal: Negative for abdominal pain.  Genitourinary: Negative for difficulty urinating.  Skin: Negative for rash.  Allergic/Immunologic: Positive for environmental allergies. Negative for food allergies.  Neurological: Negative for headaches.   Objective: BP 118/80 (BP Location: Left Arm, Patient Position: Sitting, Cuff Size: Normal)   Temp 98.1 F (36.7 C) (Temporal)   Resp 18  There is no height or weight on file to calculate BMI. Physical Exam  Constitutional: She is oriented to person, place, and time. She appears well-developed and well-nourished.  HENT:  Head: Normocephalic and atraumatic.  Right Ear: External ear normal.  Left Ear: External ear normal.  Nose: Nose normal.  Mouth/Throat: Oropharynx is clear and moist.  Eyes: Conjunctivae and EOM  are normal.  Neck: Neck supple.  Cardiovascular: Normal rate, regular rhythm and normal heart sounds. Exam reveals no gallop and no friction rub.  No murmur heard. Pulmonary/Chest: Effort normal and breath sounds normal. She has no wheezes. She has no rales.  Abdominal: Soft.  Neurological: She is alert and oriented to person, place, and time.  Skin: Skin is warm. No rash noted.  Psychiatric: She has a normal mood and affect. Her behavior is normal.  Nursing note and vitals reviewed.  Previous notes and tests were reviewed. The plan was reviewed with the patient/family, and all questions/concerned were addressed.  It was my pleasure to see Mirabelle today and participate in her care. Please feel free to contact me with any questions or concerns.  Sincerely,  Rexene Alberts, DO Allergy & Immunology  [facility] office: [phone] [facility] office: [place] office: [phone]

## 2019-06-10 ENCOUNTER — Telehealth: Payer: Self-pay | Admitting: Internal Medicine

## 2019-06-10 MED ORDER — IBANDRONATE SODIUM 150 MG PO TABS: 150.0000 mg | ORAL_TABLET | ORAL | 5 refills | Status: DC

## 2019-06-10 NOTE — Telephone Encounter (Signed)
Received Fax RX request from  [facility]  Medication - ibandronate (BONIVA) 150 MG tablet    Last Refill - 05/10/19  Last OV - 05/13/19  Last CPE - 09/25/18

## 2019-06-18 DIAGNOSIS — D2272 Melanocytic nevi of left lower limb, including hip: Secondary | ICD-10-CM | POA: Diagnosis not present

## 2019-06-18 DIAGNOSIS — D225 Melanocytic nevi of trunk: Secondary | ICD-10-CM | POA: Diagnosis not present

## 2019-06-18 DIAGNOSIS — D2230 Melanocytic nevi of unspecified part of face: Secondary | ICD-10-CM | POA: Diagnosis not present

## 2019-06-18 DIAGNOSIS — D2371 Other benign neoplasm of skin of right lower limb, including hip: Secondary | ICD-10-CM | POA: Diagnosis not present

## 2019-06-18 DIAGNOSIS — D485 Neoplasm of uncertain behavior of skin: Secondary | ICD-10-CM | POA: Diagnosis not present

## 2019-06-18 DIAGNOSIS — L821 Other seborrheic keratosis: Secondary | ICD-10-CM | POA: Diagnosis not present

## 2019-06-18 DIAGNOSIS — Z23 Encounter for immunization: Secondary | ICD-10-CM | POA: Diagnosis not present

## 2019-07-02 ENCOUNTER — Telehealth: Payer: Self-pay

## 2019-07-02 MED ORDER — BUDESONIDE-FORMOTEROL FUMARATE 80-4.5 MCG/ACT IN AERO: 2.0000 | INHALATION_SPRAY | Freq: Two times a day (BID) | RESPIRATORY_TRACT | 5 refills | Status: DC

## 2019-07-02 NOTE — Telephone Encounter (Signed)
Patient would like to go ahead and do a prescription for Symbicort 80.  [place].

## 2019-07-02 NOTE — Telephone Encounter (Signed)
Please call back patient. Sent in script. It says 2 puffs twice a day but if she was taking it 1 puff twice a day then continue with the 1 puff twice a day dose.

## 2019-07-02 NOTE — Telephone Encounter (Signed)
Called and spoke with patient stating Rx was sent to pharmacy 2 puffs bid but stated she can do 1 puff BID per Dr. Maudie Mercury.

## 2019-07-02 NOTE — Telephone Encounter (Signed)
Dr. Maudie Mercury may we move forward with sending the Symbicort 80 prescription?

## 2019-07-31 ENCOUNTER — Ambulatory Visit (INDEPENDENT_AMBULATORY_CARE_PROVIDER_SITE_OTHER): Payer: PPO | Admitting: Allergy

## 2019-07-31 ENCOUNTER — Encounter: Payer: Self-pay | Admitting: Allergy

## 2019-07-31 ENCOUNTER — Other Ambulatory Visit: Payer: Self-pay | Admitting: Internal Medicine

## 2019-07-31 ENCOUNTER — Other Ambulatory Visit: Payer: Self-pay

## 2019-07-31 VITALS — BP 130/80 | HR 76 | Temp 97.8°F | Resp 16

## 2019-07-31 DIAGNOSIS — J4530 Mild persistent asthma, uncomplicated: Secondary | ICD-10-CM

## 2019-07-31 DIAGNOSIS — J3089 Other allergic rhinitis: Secondary | ICD-10-CM

## 2019-07-31 DIAGNOSIS — Z20822 Contact with and (suspected) exposure to covid-19: Secondary | ICD-10-CM

## 2019-07-31 DIAGNOSIS — Z20828 Contact with and (suspected) exposure to other viral communicable diseases: Secondary | ICD-10-CM | POA: Diagnosis not present

## 2019-07-31 NOTE — Progress Notes (Signed)
Follow Up Note  RE: Mary Garcia MRN: [id number] DOB: [birth year] Date of Office Visit: 07/31/2019  Referring provider: Elby Showers, MD Primary care provider: Elby Showers, MD  Chief Complaint: Asthma (she has been feeling better with the Symbicort. 1 puff in the morning and 1 puff in the evening. )  History of Present Illness: I had the pleasure of seeing Mary Garcia for a follow up visit at the [facility] on 07/31/2019. She is a 73 y.o. female, who is being followed for asthma, allergic rhinitis. Today she is here for regular follow up visit. Her previous allergy office visit was on 06/03/2019 with Dr. Maudie Mercury.   Asthma Currently on Symbicort 80 1 puff twice a day and noticed improvement in her breathing - she can take deeper breaths now and noticed on a few occasions that she did not require pretreatment with albuterol prior to her exertion.  Patient's grandchildren had a cold and now she feels like she has URI symptoms for the past few days. She used albuterol for this with good benefit. Patient is feeling better now.  Still has some congestion in the morning.  Denies fevers/chills. She did not get tested for COVID-19 tested.   No prednisone use since the last time.   Other allergic rhinitis Patient has been doing better with new dehumidifier and ozonator in the cabin. Used azelastine as needed with some benefit.  Assessment and Plan: Mary Garcia is a 73 y.o. female with: Mild persistent asthma without complication Past history - Issues with coughing, wheezing and SOB for the past 3-4 years with worsening during URIs. Used to be on Flovent, Qvar in the past. Tried PPIs in the past with no benefit. 2020 skin testing showed: Borderline positive to maple tree pollen which pollinates in the spring months. This explains her worsening coughing which started in May. 2020 spirometry showed: mild possible restrictive disease with 14% improvement in FEV post bronchodilator  treatment.  Interim history - doing much better with Symbicort.  Also noticed not needing pretreatment prior to physical activity.  Increase Symbicort 80 to 2 puffs twice a day for 1 week due to current URI symptoms.  Daily controller medication(s): continue Symbicort 80 1 puff twice a day with spacer and rinse mouth afterwards.  Prior to physical activity:May use albuterol rescue inhaler 2 puffs 5 to 15 minutes prior to strenuous physical activities.  Rescue medications:May use albuterol rescue inhaler 2 puffs or nebulizer every 4 to 6 hours as needed for shortness of breath, chest tightness, coughing, and wheezing. Monitor frequency of use. . During upper respiratory infections/asthma flares: Start Symbicort 80 2 puffs twice a day for 1-2 weeks . Will get spirometry at next visit instead of today due to COVID-19 pandemic and trying to minimize any type of aerosolizing procedures at this time in the office.   Suspected COVID-19 virus infection Mild URI symptoms with coughing and congestion.  Patient's grandchildren had similar symptoms last week.  Slowly improving.  No previous Covid testing.  Advised patient to get COVID-19 testing and quarantine until results are back.  Other allergic rhinitis Past history - Perennial rhinitis symptoms for the past 8 years. No recent ENT evaluation. 2020 skin testing was borderline positive to tree pollen.  Interim history - stable.   Continue environmental control measures.   May use azelastine nasal spray 1-2 sprays per nostril 1-2 times a day as needed for runny nose.  May use over the counter antihistamines such  as Zyrtec (cetirizine), Claritin (loratadine), Allegra (fexofenadine), or Xyzal (levocetirizine) daily as needed.  Return in about 3 months (around 10/29/2019).  Lab Orders     Novel Coronavirus, NAA ([facility])  Diagnostics: None.  Medication List:  Current Outpatient Medications  Medication Sig Dispense Refill  . ALPRAZolam  (XANAX) 0.25 MG tablet One po tid prn 30 tablet 1  . azelastine (ASTELIN) 0.1 % nasal spray Place 1 spray into both nostrils 2 (two) times daily as needed for rhinitis. Use in each nostril as directed 30 mL 5  . budesonide-formoterol (SYMBICORT) 80-4.5 MCG/ACT inhaler Inhale 2 puffs into the lungs 2 (two) times daily. 1 Inhaler 5  . diphenhydramine-acetaminophen (TYLENOL PM) 25-500 MG TABS Take 1 tablet by mouth at bedtime as needed (pain).    Marland Kitchen ELDERBERRY PO Take by mouth.    . Fluticasone Furoate (ARNUITY ELLIPTA) 100 MCG/ACT AEPB Inhale 1 puff into the lungs daily. 30 each 3  . ibandronate (BONIVA) 150 MG tablet Take 1 tablet (150 mg total) by mouth every 30 (thirty) days. Take in the morning with a full glass of water, on an empty stomach, and do not take anything else by mouth or lie down for the next 30 min. 1 tablet 5  . PROAIR RESPICLICK 123XX123 (90 Base) MCG/ACT AEPB INHALE 2 PUFFS BY MOUTH 4 TIMES DAILY 1 each prn  . Vitamin D, Cholecalciferol, 1000 UNITS TABS Take 1 tablet by mouth daily.    Marland Kitchen zolpidem (AMBIEN) 10 MG tablet TAKE 1 TABLET BY MOUTH EVERY DAY AT BEDTIME AS NEEDED FOR SLEEP 90 tablet 1   No current facility-administered medications for this visit.   Allergies: No Known Allergies I reviewed her past medical history, social history, family history, and environmental history and no significant changes have been reported from her previous visit.  Review of Systems  Constitutional: Negative for appetite change, chills, fever and unexpected weight change.  HENT: Negative for congestion and rhinorrhea.   Eyes: Negative for itching.  Respiratory: Positive for cough. Negative for chest tightness, shortness of breath and wheezing.   Cardiovascular: Negative for chest pain.  Gastrointestinal: Negative for abdominal pain.  Genitourinary: Negative for difficulty urinating.  Skin: Negative for rash.  Allergic/Immunologic: Positive for environmental allergies. Negative for food  allergies.  Neurological: Negative for headaches.   Objective: BP 130/80 (BP Location: Left Arm, Patient Position: Sitting, Cuff Size: Normal)   Pulse 76   Temp 97.8 F (36.6 C) (Temporal)   Resp 16   SpO2 98%  There is no height or weight on file to calculate BMI. Physical Exam  Constitutional: She is oriented to person, place, and time. She appears well-developed and well-nourished.  HENT:  Head: Normocephalic and atraumatic.  Right Ear: External ear normal.  Left Ear: External ear normal.  Nose: Nose normal.  Mouth/Throat: Oropharynx is clear and moist.  Eyes: Conjunctivae and EOM are normal.  Cardiovascular: Normal rate, regular rhythm and normal heart sounds. Exam reveals no gallop and no friction rub.  No murmur heard. Pulmonary/Chest: Effort normal and breath sounds normal. She has no wheezes. She has no rales.  Abdominal: Soft.  Musculoskeletal:     Cervical back: Neck supple.  Neurological: She is alert and oriented to person, place, and time.  Skin: Skin is warm. No rash noted.  Psychiatric: She has a normal mood and affect. Her behavior is normal.  Nursing note and vitals reviewed.  Previous notes and tests were reviewed. The plan was reviewed with the patient/family, and all  questions/concerned were addressed.  It was my pleasure to see Mary Garcia today and participate in her care. Please feel free to contact me with any questions or concerns.  Sincerely,  Rexene Alberts, DO Allergy & Immunology  [facility] office: [phone] [facility] office: [place] office: [phone]

## 2019-07-31 NOTE — Assessment & Plan Note (Signed)
Past history - Issues with coughing, wheezing and SOB for the past 3-4 years with worsening during URIs. Used to be on Flovent, Qvar in the past. Tried PPIs in the past with no benefit. 2020 skin testing showed: Borderline positive to maple tree pollen which pollinates in the spring months. This explains her worsening coughing which started in May. 2020 spirometry showed: mild possible restrictive disease with 14% improvement in FEV post bronchodilator treatment.  Interim history - doing much better with Symbicort.  Also noticed not needing pretreatment prior to physical activity.  Increase Symbicort 80 to 2 puffs twice a day for 1 week due to current URI symptoms.  Daily controller medication(s): continue Symbicort 80 1 puff twice a day with spacer and rinse mouth afterwards.  Prior to physical activity:May use albuterol rescue inhaler 2 puffs 5 to 15 minutes prior to strenuous physical activities.  Rescue medications:May use albuterol rescue inhaler 2 puffs or nebulizer every 4 to 6 hours as needed for shortness of breath, chest tightness, coughing, and wheezing. Monitor frequency of use. . During upper respiratory infections/asthma flares: Start Symbicort 80 2 puffs twice a day for 1-2 weeks . Will get spirometry at next visit instead of today due to COVID-19 pandemic and trying to minimize any type of aerosolizing procedures at this time in the office.

## 2019-07-31 NOTE — Assessment & Plan Note (Signed)
Mild URI symptoms with coughing and congestion.  Patient's grandchildren had similar symptoms last week.  Slowly improving.  No previous Covid testing.  Advised patient to get COVID-19 testing and quarantine until results are back.

## 2019-07-31 NOTE — Patient Instructions (Addendum)
Asthma  Get covid testing done.   Increase Symbicort 80 to 2 puffs twice a day for 1 week.  Daily controller medication(s): continue Symbicort 80 1 puff twice a day with spacer and rinse mouth afterwards.  Prior to physical activity:May use albuterol rescue inhaler 2 puffs 5 to 15 minutes prior to strenuous physical activities.  Rescue medications:May use albuterol rescue inhaler 2 puffs or nebulizer every 4 to 6 hours as needed for shortness of breath, chest tightness, coughing, and wheezing. Monitor frequency of use. . During upper respiratory infections/asthma flares: Start symbicort 80 2 puffs twice a day for 1-2 weeks . Asthma control goals:  o Full participation in all desired activities (may need albuterol before activity) o Albuterol use two times or less a week on average (not counting use with activity) o Cough interfering with sleep two times or less a month o Oral steroids no more than once a year o No hospitalizations  Other allergic rhinitis 2020 skin testing was borderline positive to tree pollen.   Continue environmental control measures.   May use azelastine nasal spray 1-2 sprays per nostril 1-2 times a day as needed for runny nose.  May use over the counter antihistamines such as Zyrtec (cetirizine), Claritin (loratadine), Allegra (fexofenadine), or Xyzal (levocetirizine) daily as needed.  Follow up in 3 months or sooner if needed.

## 2019-07-31 NOTE — Telephone Encounter (Signed)
Please call and book CPE after Feb 11 before refilling

## 2019-07-31 NOTE — Assessment & Plan Note (Signed)
Past history - Perennial rhinitis symptoms for the past 8 years. No recent ENT evaluation. 2020 skin testing was borderline positive to tree pollen.  Interim history - stable.   Continue environmental control measures.   May use azelastine nasal spray 1-2 sprays per nostril 1-2 times a day as needed for runny nose.  May use over the counter antihistamines such as Zyrtec (cetirizine), Claritin (loratadine), Allegra (fexofenadine), or Xyzal (levocetirizine) daily as needed.

## 2019-08-01 MED ORDER — ZOLPIDEM TARTRATE 10 MG PO TABS: ORAL_TABLET | ORAL | 1 refills | Status: DC

## 2019-08-01 NOTE — Telephone Encounter (Signed)
Scheduled CPE, can you please sign, pended rx. Thank you.

## 2019-08-01 NOTE — Addendum Note (Signed)
Addended by: Mady Haagensen on: 08/01/2019 11:21 AM   Modules accepted: Orders

## 2019-08-05 ENCOUNTER — Other Ambulatory Visit: Payer: PPO

## 2019-08-21 DIAGNOSIS — Z1231 Encounter for screening mammogram for malignant neoplasm of breast: Secondary | ICD-10-CM | POA: Diagnosis not present

## 2019-08-21 LAB — HM MAMMOGRAPHY

## 2019-08-22 ENCOUNTER — Encounter: Payer: Self-pay | Admitting: Internal Medicine

## 2019-08-23 DIAGNOSIS — M65331 Trigger finger, right middle finger: Secondary | ICD-10-CM | POA: Diagnosis not present

## 2019-09-02 ENCOUNTER — Telehealth: Payer: Self-pay | Admitting: Internal Medicine

## 2019-09-02 ENCOUNTER — Ambulatory Visit (INDEPENDENT_AMBULATORY_CARE_PROVIDER_SITE_OTHER): Payer: PPO | Admitting: Internal Medicine

## 2019-09-02 ENCOUNTER — Other Ambulatory Visit: Payer: Self-pay

## 2019-09-02 DIAGNOSIS — R1013 Epigastric pain: Secondary | ICD-10-CM

## 2019-09-02 DIAGNOSIS — K591 Functional diarrhea: Secondary | ICD-10-CM

## 2019-09-02 MED ORDER — DICYCLOMINE HCL 10 MG PO CAPS: 10.0000 mg | ORAL_CAPSULE | Freq: Three times a day (TID) | ORAL | 1 refills | Status: DC

## 2019-09-02 NOTE — Telephone Encounter (Signed)
Set up virtual visit 

## 2019-09-02 NOTE — Telephone Encounter (Signed)
Schedule virtual visit 

## 2019-09-02 NOTE — Telephone Encounter (Signed)
Mary Garcia [id number]  Mary Garcia called to say that she has been having stomach problems since the holidays and would like to be seen or a virtual visit. She is having burning, cramping and diarrhea. No fever, No other symptoms, No COVID-19 exposure that she knows of.

## 2019-09-02 NOTE — Progress Notes (Signed)
   Subjective:    Patient ID: Mary Garcia, female    DOB: 11-16-1945, 74 y.o.   MRN: [id number]  HPI 74 year old Female who indicates for the past several weeks she has had issues with diarrhea frequently and dyspepsia with epigastric burning.  She does like to eat spicy foods.  This seems to have started during the Christmas holidays.  She has tried over-the-counter acid blocking medication without much success.  Says that Dr. Delfin Edis gave her Bentyl a number of years ago and that did help with irritable bowel symptoms.  Denies travel history that would have caused recent attacks of diarrhea.  Has some leftover Bentyl but it is a couple of years old and she is afraid to take it.  Due to the Coronavirus pandemic patient is seen today by interactive [employer].  She is identified using 2 identifiers as Mary Garcia. Yard, a patient in this practice.  She is agreeable to visit in this format today.   Review of Systems  Respiratory: Negative.   Cardiovascular: Negative.   Gastrointestinal: Positive for diarrhea. Negative for blood in stool.  Genitourinary: Negative.    see above     Objective:   Physical Exam Patient seen virtually today in no acute distress.  Feels better today than she has in a few days.  Denies fever chills nausea vomiting sore throat myalgias headache or dysgeusia.  No known COVID-19 exposure.       Assessment & Plan:  Dyspepsia-considerations include GE reflux, gastric ulcer or H. pylori infection.  She will come to the office later this week for H. pylori testing.  She may continue with over-the-counter PPI until results are back.  Diarrhea-could be due to diet and or irritable bowel syndrome.  She recently started taking probiotics and says this has improved.  She does like to eat spicy foods.  Was diagnosed by Dr. Olevia Perches with irritable bowel syndrome.  Says she responded well in the past eventually also I will refill her been till today.  Doubt she  has infectious diarrhea.  No travel history.  Hold off on obtaining stool studies for now.

## 2019-09-03 ENCOUNTER — Encounter: Payer: Self-pay | Admitting: Internal Medicine

## 2019-09-03 ENCOUNTER — Ambulatory Visit: Payer: PPO | Attending: Internal Medicine

## 2019-09-03 DIAGNOSIS — Z23 Encounter for immunization: Secondary | ICD-10-CM | POA: Insufficient documentation

## 2019-09-03 NOTE — Progress Notes (Signed)
   Covid-19 Vaccination Clinic  Name:  Mary Garcia    MRN: [id number] DOB: 06/23/1946  09/03/2019  Mary Garcia was observed post Covid-19 immunization for 15 minutes without incidence. She was provided with Vaccine Information Sheet and instruction to access the V-Safe system.   Mary Garcia was instructed to call 911 with any severe reactions post vaccine: Marland Kitchen Difficulty breathing  . Swelling of your face and throat  . A fast heartbeat  . A bad rash all over your body  . Dizziness and weakness    Immunizations Administered    Name Date Dose VIS Date Route   Pfizer COVID-19 Vaccine 09/03/2019  6:01 PM 0.3 mL 07/26/2019 Intramuscular   Manufacturer: [employer], [employer]   Lot: [id number]   [place]: [id number]

## 2019-09-03 NOTE — Patient Instructions (Signed)
Patient will come later in the week for H. pylori testing.  Watch spicy foods.  Bentyl has been refilled.  Consider prescription strength PPI for dyspepsia if H. pylori testing is negative.

## 2019-09-05 ENCOUNTER — Other Ambulatory Visit: Payer: Self-pay

## 2019-09-05 ENCOUNTER — Other Ambulatory Visit: Payer: PPO | Admitting: Internal Medicine

## 2019-09-05 DIAGNOSIS — R1013 Epigastric pain: Secondary | ICD-10-CM | POA: Diagnosis not present

## 2019-09-05 DIAGNOSIS — R197 Diarrhea, unspecified: Secondary | ICD-10-CM | POA: Diagnosis not present

## 2019-09-06 LAB — H. PYLORI BREATH TEST: H. pylori Breath Test: NOT DETECTED

## 2019-09-23 ENCOUNTER — Ambulatory Visit: Payer: PPO | Attending: Internal Medicine

## 2019-09-23 DIAGNOSIS — Z23 Encounter for immunization: Secondary | ICD-10-CM | POA: Insufficient documentation

## 2019-09-23 NOTE — Progress Notes (Signed)
   Covid-19 Vaccination Clinic  Name:  Mary Garcia    MRN: [id number] DOB: 1946/03/10  09/23/2019  [name] was observed post Covid-19 immunization for 15 minutes without incidence. She was provided with Vaccine Information Sheet and instruction to access the V-Safe system.   [name] was instructed to call 911 with any severe reactions post vaccine: Marland Kitchen Difficulty breathing  . Swelling of your face and throat  . A fast heartbeat  . A bad rash all over your body  . Dizziness and weakness    Immunizations Administered    Name Date Dose VIS Date Route   Pfizer COVID-19 Vaccine 09/23/2019 10:15 AM 0.3 mL 07/26/2019 Intramuscular   Manufacturer: [place]   Lot: [id number]   [place]: [id number]

## 2019-10-01 ENCOUNTER — Telehealth: Payer: Self-pay | Admitting: Internal Medicine

## 2019-10-01 MED ORDER — CYCLOBENZAPRINE HCL 10 MG PO TABS: 10.0000 mg | ORAL_TABLET | Freq: Every day | ORAL | 0 refills | Status: DC

## 2019-10-01 NOTE — Telephone Encounter (Signed)
Mary Garcia [id number]  Dorismar called to see if she could get a refill on below medication. It helps her to rest at night, because she is still having icelated episodes with her back.  cyclobenzaprine (FLEXERIL) 10 MG tablet  Last filled in 2019, she has only been using when needed.

## 2019-10-01 NOTE — Telephone Encounter (Signed)
Refill Flexeril

## 2019-10-04 ENCOUNTER — Ambulatory Visit: Payer: PPO

## 2019-10-29 ENCOUNTER — Other Ambulatory Visit: Payer: Self-pay

## 2019-10-29 ENCOUNTER — Ambulatory Visit: Payer: Self-pay

## 2019-10-29 ENCOUNTER — Encounter: Payer: Self-pay | Admitting: Family Medicine

## 2019-10-29 ENCOUNTER — Ambulatory Visit: Payer: PPO | Admitting: Family Medicine

## 2019-10-29 DIAGNOSIS — R222 Localized swelling, mass and lump, trunk: Secondary | ICD-10-CM

## 2019-10-29 DIAGNOSIS — M545 Low back pain, unspecified: Secondary | ICD-10-CM

## 2019-10-29 NOTE — Progress Notes (Signed)
Mary Garcia - 74 y.o. female MRN [id number]  Date of birth: 1946-02-06  Office Visit Note: Visit Date: 10/29/2019 PCP: Elby Showers, MD Referred by: Elby Showers, MD  Subjective: Chief Complaint  Patient presents with  . Lower Back - Pain   HPI: Mary ARONSON is a 74 y.o. female who comes in today with right lower back pain for the past year.  She reports that she has a history of schwannoma in her right paraspinal lumbar muscles that was surgically removed 20 years ago by Dr. Leonides Schanz. For the past year, she has had pain in the same spot as her surgery. She feels it most with bending forward and to the side, especially when she cares for her grandchildren. She is very active, works with a Clinical research associate 2x a week, does yoga and cycling. She has been working on quad, hamstring flexibility and strength, as well as core strength. She had dry needling and massage in back which has not helped. Muscle relaxer has helped some. Has tried gabapentin but did not like they way it made her feel.  No fevers, weight loss, night sweats, myalgias, joint pain.   ROS Otherwise per HPI.  Assessment & Plan: Visit Diagnoses:  1. Right-sided low back pain without sciatica, unspecified chronicity   2. Palpable mass of lower back     Plan: Right paraspinal pain over area where schwannoma tumor was removed 20 years ago. Lumbar x-rays obtained showing mild lumbar scoliosis and mild degenerative changes. She has a palpable difference between left and right paraspinal muscles; unclear if this is scar tissue or possibly recurrent tumor. Will obtain MRI of lumbar spine to further evaluate.   Meds & Orders: No orders of the defined types were placed in this encounter.   Orders Placed This Encounter  Procedures  . XR Lumbar Spine 2-3 Views  . MR Lumbar Spine W Wo Contrast  . Basic metabolic panel    Follow-up: No follow-ups on file.   Procedures: No procedures performed  No notes on file   Clinical History: No  specialty comments available.   She reports that she quit smoking about 41 years ago. Her smoking use included cigarettes. She has a 6.00 pack-year smoking history. She has never used smokeless tobacco. No results for input(s): HGBA1C, LABURIC in the last 8760 hours.  Objective:  VS:  HT:    WT:   BMI:     BP:   HR: bpm  TEMP: ( )  RESP:  Physical Exam  PHYSICAL EXAM: Gen: NAD, alert, cooperative with exam, well-appearing HEENT: clear conjunctiva,  CV:  no edema, capillary refill brisk, normal rate Resp: non-labored Skin: no rashes, normal turgor  Neuro: no gross deficits.  Psych:  alert and oriented  Ortho Exam  Lumbar spine: - Inspection: well healed vertical surgical scar along right paraspinal region (~L2-L5)  - Palpation: area of nodularity appreciated under scar at L3-L5. No TTP - ROM: full active ROM of the lumbar spine in flexion and extension without pain - Strength: 5/5 strength of lower extremity in L4-S1 nerve root distributions b/l; normal gait - Neuro: sensation intact in the L4-S1 nerve root distribution b/l, 2+ L4 and S1 reflexes  Imaging: No results found.  Past Medical/Family/Surgical/Social History: Medications & Allergies reviewed per EMR, new medications updated. Patient Active Problem List   Diagnosis Date Noted  . Suspected COVID-19 virus infection 07/31/2019  . Mild persistent asthma without complication [id number]  . Other allergic rhinitis 03/27/2019  .  DDD (degenerative disc disease), cervical 09/15/2017  . Asthma with acute exacerbation 07/07/2016  . Depression 02/02/2016  . Arthropathy of right shoulder 02/02/2016  . Asthma 11/17/2014  . Insomnia 04/13/2011  . Osteoporosis 04/13/2011  . Biliary dyskinesia 04/13/2011  . Osteoarthritis 04/13/2011  . History of migraine headaches 04/13/2011  . Vitamin D deficiency 04/13/2011  . Anxiety 04/13/2011  . Irritable bowel syndrome 04/13/2011   Past Medical History:  Diagnosis Date  . Anxiety     . Arthritis   . Asthma   . Bronchitis, acute    Family History  Problem Relation Age of Onset  . Asthma Mother   . Brain cancer Father   . Colon cancer Neg Hx    Past Surgical History:  Procedure Laterality Date  . BREAST ENHANCEMENT SURGERY    . FACELIFT     local  . TIBIA FRACTURE SURGERY  2004   left leg x3, tibia and fibula  . TUMOR EXCISION  2003   right muscle back, benign   Social History   Occupational History  . Occupation: retired  Tobacco Use  . Smoking status: Former Smoker    Packs/day: 0.40    Years: 15.00    Pack years: 6.00    Types: Cigarettes    Quit date: 08/15/1978    Years since quitting: 41.2  . Smokeless tobacco: Never Used  . Tobacco comment: smokes 5 cigs daily when smoking  Substance and Sexual Activity  . Alcohol use: Yes    Alcohol/week: 2.0 standard drinks    Types: 2 Glasses of wine per week    Comment: 2-3 times a week  . Drug use: No  . Sexual activity: Not on file

## 2019-10-29 NOTE — Progress Notes (Signed)
No known injury Previous surgery 20 years ago Right lower back pain  gabpentin was given but didn't like how it made her feel Muscle relaxer helps some at night

## 2019-10-29 NOTE — Progress Notes (Signed)
I saw and examined the patient with Dr. Mayer Masker and agree with assessment and plan as outlined.  Referred by Buren Kos.  History of right-sided paraspinous schwannoma removed 20 years ago per Dr. Leonides Schanz.  In the past year she has had some pain in her low back and has noticed nodularity in the area of her previous tumor.  No fevers or chills, no radicular symptoms.  She is working with her trainer and a physical therapist.  Exam reveals a long well-healed surgical scar to the right of midline in the lumbar area.  At the lower half of the scar there is some subcutaneous nodularity.  X-rays today are unrevealing other than some scoliosis and mild degenerative changes.  We will order MRI with contrast to further evaluate.

## 2019-10-30 ENCOUNTER — Ambulatory Visit (INDEPENDENT_AMBULATORY_CARE_PROVIDER_SITE_OTHER): Payer: PPO | Admitting: Allergy

## 2019-10-30 ENCOUNTER — Encounter: Payer: Self-pay | Admitting: Allergy

## 2019-10-30 VITALS — BP 126/82 | HR 83 | Temp 97.2°F | Resp 16 | Ht 63.0 in | Wt 140.2 lb

## 2019-10-30 DIAGNOSIS — J3089 Other allergic rhinitis: Secondary | ICD-10-CM

## 2019-10-30 DIAGNOSIS — J4530 Mild persistent asthma, uncomplicated: Secondary | ICD-10-CM | POA: Diagnosis not present

## 2019-10-30 NOTE — Assessment & Plan Note (Signed)
Past history - Issues with coughing, wheezing and SOB for the past 3-4 years with worsening during URIs. Used to be on Flovent, Qvar in the past. Tried PPIs in the past with no benefit. 2020 skin testing showed: Borderline positive to maple tree pollen. 2020 spirometry showed: mild possible restrictive disease with 14% improvement in FEV post bronchodilator treatment.  Interim history - had URI few weeks ago needing albuterol otherwise doing well.   Today's ACT score 20.  Today's spirometry showed mild restriction.   Daily controller medication(s): continue Symbicort 80 1 puff twice a day with spacer and rinse mouth afterwards.  May use albuterol rescue inhaler 2 puffs or nebulizer every 4 to 6 hours as needed for shortness of breath, chest tightness, coughing, and wheezing. May use albuterol rescue inhaler 2 puffs 5 to 15 minutes prior to strenuous physical activities. Monitor frequency of use.   During upper respiratory infections/asthma flares: Start Symbicort 80 2 puffs twice a day for 1-2 weeks.

## 2019-10-30 NOTE — Assessment & Plan Note (Signed)
Past history - Perennial rhinitis symptoms for the past 8 years. No recent ENT evaluation. 2020 skin testing was borderline positive to tree pollen.  Interim history - stable and only takes medications as needed.   Continue environmental control measures.   May use azelastine nasal spray 1-2 sprays per nostril 1-2 times a day as needed for runny nose.  May use over the counter antihistamines such as Zyrtec (cetirizine), Claritin (loratadine), Allegra (fexofenadine), or Xyzal (levocetirizine) daily as needed.

## 2019-10-30 NOTE — Patient Instructions (Addendum)
Mild persistent asthma  Daily controller medication(s): continue Symbicort 80 1 puff twice a day with spacer and rinse mouth afterwards.  May use albuterol rescue inhaler 2 puffs or nebulizer every 4 to 6 hours as needed for shortness of breath, chest tightness, coughing, and wheezing. May use albuterol rescue inhaler 2 puffs 5 to 15 minutes prior to strenuous physical activities. Monitor frequency of use.   During upper respiratory infections/asthma flares: Start Symbicort 80 2 puffs twice a day for 1-2 weeks Asthma control goals:  Full participation in all desired activities (may need albuterol before activity) Albuterol use two times or less a week on average (not counting use with activity) Cough interfering with sleep two times or less a month Oral steroids no more than once a year No hospitalizations  Other allergic rhinitis 2020 skin testing was borderline positive to tree pollen.   Continue environmental control measures.   May use azelastine nasal spray 1-2 sprays per nostril 1-2 times a day as needed for runny nose.  May use over the counter antihistamines such as Zyrtec (cetirizine), Claritin (loratadine), Allegra (fexofenadine), or Xyzal (levocetirizine) daily as needed.  Follow up in 4 months or sooner if needed.  Reducing Pollen Exposure . Pollen seasons: trees (spring), grass (summer) and ragweed/weeds (fall). . Keep windows closed in your home and car to lower pollen exposure.  . Install air conditioning in the bedroom and throughout the house if possible.  . Avoid going out in dry windy days - especially early morning. . Pollen counts are highest between 5 - 10 AM and on dry, hot and windy days.  . Save outside activities for late afternoon or after a heavy rain, when pollen levels are lower.  . Avoid mowing of grass if you have grass pollen allergy. . Be aware that pollen can also be transported indoors on people and pets.  . Dry your clothes in an automatic dryer  rather than hanging them outside where they might collect pollen.  . Rinse hair and eyes before bedtime. 

## 2019-10-30 NOTE — Progress Notes (Signed)
Follow Up Note  RE: Mary Garcia MRN: [id number] DOB: [birth year] Date of Office Visit: 10/30/2019  Referring provider: Elby Showers, MD Primary care provider: Elby Showers, MD  Chief Complaint: Asthma (no issues)  History of Present Illness: I had the pleasure of seeing Mary Garcia for a follow up visit at the [facility] on 10/30/2019. She is a 74 y.o. female, who is being followed for asthma, allergic rhinitis. Her previous allergy office visit was on 07/31/2019 with Dr. Maudie Mercury. Today is a regular follow up visit.  Mild persistent asthma  Patient had a cold a few weeks ago and used rescue inhaler at that time with good benefit. No fevers or chills. No sick contacts. Did not have to use oral prednisone.   Currently on Symbicort 80 1 puff BID and doing well on it.  She did not increase the dose to 2 puffs twice a day during her cold.  Patient is fully vaccinated for COVID-19 and therefore she did not think she had COVID and did not get testing done.  Other allergic rhinitis Had some sneezing and nasal congestion for 1 day. Took zyrtec prn and azelastine nasal spray with good benefit.   Assessment and Plan: Mary Garcia is a 74 y.o. female with: Mild persistent asthma without complication Past history - Issues with coughing, wheezing and SOB for the past 3-4 years with worsening during URIs. Used to be on Flovent, Qvar in the past. Tried PPIs in the past with no benefit. 2020 skin testing showed: Borderline positive to maple tree pollen. 2020 spirometry showed: mild possible restrictive disease with 14% improvement in FEV post bronchodilator treatment.  Interim history - had URI few weeks ago needing albuterol otherwise doing well.   Today's ACT score 20.  Today's spirometry showed mild restriction.   Daily controller medication(s): continue Symbicort 80 1 puff twice a day with spacer and rinse mouth afterwards.  May use albuterol rescue inhaler 2 puffs or nebulizer  every 4 to 6 hours as needed for shortness of breath, chest tightness, coughing, and wheezing. May use albuterol rescue inhaler 2 puffs 5 to 15 minutes prior to strenuous physical activities. Monitor frequency of use.   During upper respiratory infections/asthma flares: Start Symbicort 80 2 puffs twice a day for 1-2 weeks.   Other allergic rhinitis Past history - Perennial rhinitis symptoms for the past 8 years. No recent ENT evaluation. 2020 skin testing was borderline positive to tree pollen.  Interim history - stable and only takes medications as needed.   Continue environmental control measures.   May use azelastine nasal spray 1-2 sprays per nostril 1-2 times a day as needed for runny nose.  May use over the counter antihistamines such as Zyrtec (cetirizine), Claritin (loratadine), Allegra (fexofenadine), or Xyzal (levocetirizine) daily as needed.  Return in about 4 months (around 02/29/2020).  Diagnostics: Spirometry:  Tracings reviewed. Her effort: Good reproducible efforts. FVC: 2.23L FEV1: 1.62L, 76% predicted FEV1/FVC ratio: 73% Interpretation: Spirometry consistent with possible restrictive disease.  Please see scanned spirometry results for details.  Medication List:  Current Outpatient Medications  Medication Sig Dispense Refill  . ALPRAZolam (XANAX) 0.25 MG tablet One po tid prn 30 tablet 1  . azelastine (ASTELIN) 0.1 % nasal spray Place 1 spray into both nostrils 2 (two) times daily as needed for rhinitis. Use in each nostril as directed 30 mL 5  . budesonide-formoterol (SYMBICORT) 80-4.5 MCG/ACT inhaler Inhale 2 puffs into the lungs 2 (two) times  daily. (Patient taking differently: Inhale 1 puff into the lungs 2 (two) times daily. ) 1 Inhaler 5  . cyclobenzaprine (FLEXERIL) 10 MG tablet Take 1 tablet (10 mg total) by mouth at bedtime. 30 tablet 0  . dicyclomine (BENTYL) 10 MG capsule Take 1 capsule (10 mg total) by mouth 4 (four) times daily -  before meals and at  bedtime. 30 capsule 1  . diphenhydramine-acetaminophen (TYLENOL PM) 25-500 MG TABS Take 1 tablet by mouth at bedtime as needed (pain).    Marland Kitchen ELDERBERRY PO Take by mouth.    . ibandronate (BONIVA) 150 MG tablet Take 1 tablet (150 mg total) by mouth every 30 (thirty) days. Take in the morning with a full glass of water, on an empty stomach, and do not take anything else by mouth or lie down for the next 30 min. 1 tablet 5  . PROAIR RESPICLICK 123XX123 (90 Base) MCG/ACT AEPB INHALE 2 PUFFS BY MOUTH 4 TIMES DAILY 1 each prn  . Vitamin D, Cholecalciferol, 1000 UNITS TABS Take 1 tablet by mouth daily.    Marland Kitchen zolpidem (AMBIEN) 10 MG tablet TAKE 1 TABLET BY MOUTH EVERY DAY AT BEDTIME AS NEEDED FOR SLEEP 90 tablet 1   No current facility-administered medications for this visit.   Allergies: No Known Allergies I reviewed her past medical history, social history, family history, and environmental history and no significant changes have been reported from her previous visit.  Review of Systems  Constitutional: Negative for appetite change, chills, fever and unexpected weight change.  HENT: Negative for congestion and rhinorrhea.   Eyes: Negative for itching.  Respiratory: Negative for cough, chest tightness, shortness of breath and wheezing.   Cardiovascular: Negative for chest pain.  Gastrointestinal: Negative for abdominal pain.  Genitourinary: Negative for difficulty urinating.  Skin: Negative for rash.  Allergic/Immunologic: Positive for environmental allergies. Negative for food allergies.  Neurological: Negative for headaches.   Objective: BP 126/82 (BP Location: Left Arm, Patient Position: Sitting, Cuff Size: Normal)   Pulse 83   Temp (!) 97.2 F (36.2 C) (Temporal)   Resp 16   Ht 5\' 3"  (1.6 m)   Wt 140 lb 3.2 oz (63.6 kg)   SpO2 96%   BMI 24.84 kg/m  Body mass index is 24.84 kg/m. Physical Exam  Constitutional: She is oriented to person, place, and time. She appears well-developed and  well-nourished.  HENT:  Head: Normocephalic and atraumatic.  Right Ear: External ear normal.  Left Ear: External ear normal.  Nose: Nose normal.  Mouth/Throat: Oropharynx is clear and moist.  Eyes: Conjunctivae and EOM are normal.  Cardiovascular: Normal rate, regular rhythm and normal heart sounds. Exam reveals no gallop and no friction rub.  No murmur heard. Pulmonary/Chest: Effort normal and breath sounds normal. She has no wheezes. She has no rales.  Abdominal: Soft.  Musculoskeletal:     Cervical back: Neck supple.  Neurological: She is alert and oriented to person, place, and time.  Skin: Skin is warm. No rash noted.  Psychiatric: She has a normal mood and affect. Her behavior is normal.  Nursing note and vitals reviewed.  Previous notes and tests were reviewed. The plan was reviewed with the patient/family, and all questions/concerned were addressed.  It was my pleasure to see Mary Garcia today and participate in her care. Please feel free to contact me with any questions or concerns.  Sincerely,  Rexene Alberts, DO Allergy & Immunology  [facility] office: [phone] [place] office: [place] office: [phone]

## 2019-11-01 ENCOUNTER — Other Ambulatory Visit: Payer: PPO | Admitting: Internal Medicine

## 2019-11-01 ENCOUNTER — Other Ambulatory Visit: Payer: Self-pay

## 2019-11-01 DIAGNOSIS — K589 Irritable bowel syndrome without diarrhea: Secondary | ICD-10-CM

## 2019-11-01 DIAGNOSIS — F411 Generalized anxiety disorder: Secondary | ICD-10-CM

## 2019-11-01 DIAGNOSIS — K219 Gastro-esophageal reflux disease without esophagitis: Secondary | ICD-10-CM | POA: Diagnosis not present

## 2019-11-01 DIAGNOSIS — Z8709 Personal history of other diseases of the respiratory system: Secondary | ICD-10-CM | POA: Diagnosis not present

## 2019-11-01 DIAGNOSIS — E7800 Pure hypercholesterolemia, unspecified: Secondary | ICD-10-CM | POA: Diagnosis not present

## 2019-11-01 DIAGNOSIS — G4700 Insomnia, unspecified: Secondary | ICD-10-CM | POA: Diagnosis not present

## 2019-11-01 DIAGNOSIS — E785 Hyperlipidemia, unspecified: Secondary | ICD-10-CM | POA: Diagnosis not present

## 2019-11-01 DIAGNOSIS — Z0000 Encounter for general adult medical examination without abnormal findings: Secondary | ICD-10-CM | POA: Diagnosis not present

## 2019-11-01 DIAGNOSIS — M810 Age-related osteoporosis without current pathological fracture: Secondary | ICD-10-CM | POA: Diagnosis not present

## 2019-11-05 ENCOUNTER — Ambulatory Visit (INDEPENDENT_AMBULATORY_CARE_PROVIDER_SITE_OTHER): Payer: PPO | Admitting: Internal Medicine

## 2019-11-05 ENCOUNTER — Encounter: Payer: Self-pay | Admitting: Internal Medicine

## 2019-11-05 ENCOUNTER — Other Ambulatory Visit: Payer: Self-pay

## 2019-11-05 VITALS — BP 120/80 | HR 73 | Temp 98.2°F | Ht 62.0 in | Wt 138.0 lb

## 2019-11-05 DIAGNOSIS — E7800 Pure hypercholesterolemia, unspecified: Secondary | ICD-10-CM

## 2019-11-05 DIAGNOSIS — Z9882 Breast implant status: Secondary | ICD-10-CM | POA: Diagnosis not present

## 2019-11-05 DIAGNOSIS — Z0000 Encounter for general adult medical examination without abnormal findings: Secondary | ICD-10-CM | POA: Diagnosis not present

## 2019-11-05 DIAGNOSIS — F411 Generalized anxiety disorder: Secondary | ICD-10-CM

## 2019-11-05 DIAGNOSIS — Z8709 Personal history of other diseases of the respiratory system: Secondary | ICD-10-CM | POA: Diagnosis not present

## 2019-11-05 DIAGNOSIS — K580 Irritable bowel syndrome with diarrhea: Secondary | ICD-10-CM | POA: Diagnosis not present

## 2019-11-05 DIAGNOSIS — M545 Low back pain, unspecified: Secondary | ICD-10-CM

## 2019-11-05 DIAGNOSIS — M810 Age-related osteoporosis without current pathological fracture: Secondary | ICD-10-CM

## 2019-11-05 DIAGNOSIS — G4700 Insomnia, unspecified: Secondary | ICD-10-CM

## 2019-11-05 LAB — POCT URINALYSIS DIPSTICK
Appearance: NEGATIVE
Bilirubin, UA: NEGATIVE
Blood, UA: NEGATIVE
Glucose, UA: NEGATIVE
Ketones, UA: NEGATIVE
Leukocytes, UA: NEGATIVE
Nitrite, UA: NEGATIVE
Odor: NEGATIVE
Protein, UA: NEGATIVE
Spec Grav, UA: 1.01 (ref 1.010–1.025)
Urobilinogen, UA: 0.2 E.U./dL
pH, UA: 7.5 (ref 5.0–8.0)

## 2019-11-05 MED ORDER — ROSUVASTATIN CALCIUM 5 MG PO TABS: ORAL_TABLET | ORAL | 3 refills | Status: DC

## 2019-11-05 MED ORDER — ALPRAZOLAM 0.25 MG PO TABS: 0.2500 mg | ORAL_TABLET | Freq: Two times a day (BID) | ORAL | 0 refills | Status: DC | PRN

## 2019-11-05 NOTE — Patient Instructions (Signed)
Add Vitamin D level. Take Crestor 5 mg 3 times a week RTC in 3 months for OV lipid and liver functions. Rx sent for bone density. Follow up with Endocrinologist in November.
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
## 2019-11-06 ENCOUNTER — Encounter: Payer: Self-pay | Admitting: Family Medicine

## 2019-11-06 LAB — COMPLETE METABOLIC PANEL WITH GFR
AG Ratio: 2.1 (calc) (ref 1.0–2.5)
ALT: 14 U/L (ref 6–29)
AST: 19 U/L (ref 10–35)
Albumin: 4.4 g/dL (ref 3.6–5.1)
Alkaline phosphatase (APISO): 47 U/L (ref 37–153)
BUN: 20 mg/dL (ref 7–25)
CO2: 25 mmol/L (ref 20–32)
Calcium: 9.4 mg/dL (ref 8.6–10.4)
Chloride: 105 mmol/L (ref 98–110)
Creat: 0.75 mg/dL (ref 0.60–0.93)
GFR, Est African American: 93 mL/min/{1.73_m2} (ref >=60)
GFR, Est Non African American: 80 mL/min/{1.73_m2} (ref >=60)
Globulin: 2.1 g/dL (calc) (ref 1.9–3.7)
Glucose, Bld: 89 mg/dL (ref 65–99)
Potassium: 4.6 mmol/L (ref 3.5–5.3)
Sodium: 140 mmol/L (ref 135–146)
Total Bilirubin: 0.4 mg/dL (ref 0.2–1.2)
Total Protein: 6.5 g/dL (ref 6.1–8.1)

## 2019-11-06 LAB — CBC WITH DIFFERENTIAL/PLATELET
Absolute Monocytes: 280 cells/uL (ref 200–950)
Basophils Absolute: 22 cells/uL (ref 0–200)
Basophils Relative: 0.5 %
Eosinophils Absolute: 90 cells/uL (ref 15–500)
Eosinophils Relative: 2.1 %
HCT: 43 % (ref 35.0–45.0)
Hemoglobin: 14.5 g/dL (ref 11.7–15.5)
Lymphs Abs: 1045 cells/uL (ref 850–3900)
MCH: 29.7 pg (ref 27.0–33.0)
MCHC: 33.7 g/dL (ref 32.0–36.0)
MCV: 87.9 fL (ref 80.0–100.0)
MPV: 10.4 fL (ref 7.5–12.5)
Monocytes Relative: 6.5 %
Neutro Abs: 2864 cells/uL (ref 1500–7800)
Neutrophils Relative %: 66.6 %
Platelets: 288 10*3/uL (ref 140–400)
RBC: 4.89 10*6/uL (ref 3.80–5.10)
RDW: 13.2 % (ref 11.0–15.0)
Total Lymphocyte: 24.3 %
WBC: 4.3 10*3/uL (ref 3.8–10.8)

## 2019-11-06 LAB — VITAMIN D 25 HYDROXY (VIT D DEFICIENCY, FRACTURES): Vit D, 25-Hydroxy: 22 ng/mL — ABNORMAL LOW (ref 30–100)

## 2019-11-06 LAB — LIPID PANEL
Cholesterol: 252 mg/dL — ABNORMAL HIGH (ref <200)
HDL: 116 mg/dL (ref >=50)
LDL Cholesterol (Calc): 120 mg/dL (calc) — ABNORMAL HIGH
Non-HDL Cholesterol (Calc): 136 mg/dL (calc) — ABNORMAL HIGH (ref <130)
Total CHOL/HDL Ratio: 2.2 (calc) (ref <5.0)
Triglycerides: 68 mg/dL (ref <150)

## 2019-11-06 LAB — TEST AUTHORIZATION

## 2019-11-06 LAB — TSH: TSH: 2.56 mIU/L (ref 0.40–4.50)

## 2019-11-13 ENCOUNTER — Encounter: Payer: Self-pay | Admitting: Internal Medicine

## 2019-11-13 DIAGNOSIS — M85852 Other specified disorders of bone density and structure, left thigh: Secondary | ICD-10-CM | POA: Diagnosis not present

## 2019-11-13 DIAGNOSIS — M810 Age-related osteoporosis without current pathological fracture: Secondary | ICD-10-CM | POA: Diagnosis not present

## 2019-11-13 DIAGNOSIS — M85851 Other specified disorders of bone density and structure, right thigh: Secondary | ICD-10-CM | POA: Diagnosis not present

## 2019-11-14 ENCOUNTER — Encounter: Payer: Self-pay | Admitting: Internal Medicine

## 2019-11-19 ENCOUNTER — Ambulatory Visit (INDEPENDENT_AMBULATORY_CARE_PROVIDER_SITE_OTHER): Payer: PPO | Admitting: Internal Medicine

## 2019-11-19 ENCOUNTER — Encounter: Payer: Self-pay | Admitting: Internal Medicine

## 2019-11-19 ENCOUNTER — Other Ambulatory Visit: Payer: Self-pay

## 2019-11-19 VITALS — BP 100/72 | HR 85 | Temp 98.0°F | Ht 62.0 in | Wt 138.0 lb

## 2019-11-19 DIAGNOSIS — M810 Age-related osteoporosis without current pathological fracture: Secondary | ICD-10-CM | POA: Diagnosis not present

## 2019-11-19 DIAGNOSIS — E7800 Pure hypercholesterolemia, unspecified: Secondary | ICD-10-CM

## 2019-11-19 NOTE — Progress Notes (Signed)
   Subjective:    Patient ID: Mary Garcia, female    DOB: [birth year], 74 y.o.   MRN: [id number]  HPI 74 year old Female seen for follow-up on vitamin D deficiency.  Level was 22 in March.  Also she has significant hyperlipidemia.  Total cholesterol was 252 with LDL of 120.  Previously last year was 233 with LDL of 110. Would like for her to be on Crestor 5 mg 3 times a week.  Talked with her about osteoporosis.  She is on Boniva.  Has follow-up with Endocrinologist in the near future.  Patient is concerned about her lumbar spine.  She saw Dr. Junius Roads and will be having an MRI in the near future.  She is worried about recurrence of schwannoma removed a  number of years ago. T [facility] showed -3 in AP spine and -2.4 in the right femoral neck.  Agree with bone sparing medication.  Review of Systems see above     Objective:   Physical Exam blood pressure 100/72 pulse 85 temperature 98 degrees orally pulse oximetry 97% weight 138 pounds       Assessment & Plan:  Low back pain-to have lumbar MRI per Dr. Junius Roads  Osteoporosis-continue with Jaclyn Prime and will be seeing endocrinologist for follow-up soon  Hyperlipidemia-cannot convince patient to take statin medication at this point time but she agrees to follow-up here in 3 months.

## 2019-11-26 ENCOUNTER — Other Ambulatory Visit: Payer: Self-pay

## 2019-11-28 ENCOUNTER — Encounter: Payer: Self-pay | Admitting: Internal Medicine

## 2019-11-28 ENCOUNTER — Other Ambulatory Visit: Payer: Self-pay

## 2019-11-28 ENCOUNTER — Ambulatory Visit: Payer: PPO | Admitting: Internal Medicine

## 2019-11-28 VITALS — BP 120/70 | Ht 62.0 in | Wt 138.0 lb

## 2019-11-28 DIAGNOSIS — E559 Vitamin D deficiency, unspecified: Secondary | ICD-10-CM

## 2019-11-28 DIAGNOSIS — M818 Other osteoporosis without current pathological fracture: Secondary | ICD-10-CM | POA: Diagnosis not present

## 2019-11-28 NOTE — Patient Instructions (Signed)
Please have another vitamin D level checked in 1 month.  Please come back for a follow-up appointment in 1 year.
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
## 2019-12-06 ENCOUNTER — Other Ambulatory Visit: Payer: Self-pay

## 2019-12-06 ENCOUNTER — Ambulatory Visit
Admission: RE | Admit: 2019-12-06 | Discharge: 2019-12-06 | Disposition: A | Discharge status: Home / Self Care | Payer: PPO | Source: Ambulatory Visit | Attending: Family Medicine | Admitting: Family Medicine

## 2019-12-06 ENCOUNTER — Telehealth: Payer: Self-pay | Admitting: Family Medicine

## 2019-12-06 DIAGNOSIS — M48061 Spinal stenosis, lumbar region without neurogenic claudication: Secondary | ICD-10-CM | POA: Diagnosis not present

## 2019-12-06 DIAGNOSIS — R222 Localized swelling, mass and lump, trunk: Secondary | ICD-10-CM

## 2019-12-06 DIAGNOSIS — M5126 Other intervertebral disc displacement, lumbar region: Secondary | ICD-10-CM | POA: Diagnosis not present

## 2019-12-06 DIAGNOSIS — M545 Low back pain, unspecified: Secondary | ICD-10-CM

## 2019-12-06 MED ORDER — GADOBENATE DIMEGLUMINE 529 MG/ML IV SOLN
12.0000 mL | Freq: Once | INTRAVENOUS | Status: AC | PRN
  Administered 2019-12-06: 12 mL via INTRAVENOUS

## 2019-12-06 MED ORDER — IBANDRONATE SODIUM 150 MG PO TABS: 150.0000 mg | ORAL_TABLET | ORAL | 5 refills | Status: DC

## 2019-12-06 NOTE — Telephone Encounter (Signed)
Refill Boniva for a year. She recently saw endocrinologist who suggested she stay on it.

## 2019-12-06 NOTE — Telephone Encounter (Signed)
Her MRI scan shows no sign of schwannoma.  There is moderate to severe central canal stenosis at L4-5.  There is moderate narrowing of the nerve opening at L5-S1 on the right.  There is degenerative change at the T11-12 level as well.

## 2019-12-06 NOTE — Telephone Encounter (Signed)
Received Fax RX request from  [facility]# [id number]  Medication -  Ibandronate 150 mg tab   Last Refill -  10-28-19  Last OV -  11-05-19  Last CPE - 11-05-19  Next Appointment -  none

## 2019-12-06 NOTE — Addendum Note (Signed)
Addended by: Mady Haagensen on: 12/06/2019 12:27 PM   Modules accepted: Orders

## 2019-12-08 NOTE — Patient Instructions (Signed)
Continue Boniva.  Seeing endocrinologist for follow-up regarding osteoporosis.  MRI has been scheduled with Dr. Junius Roads for back pain.  Recommend Crestor but we can recheck this after a trial of diet and exercise in 3 months.

## 2019-12-09 MED ORDER — PREDNISONE 10 MG PO TABS: ORAL_TABLET | ORAL | 0 refills | Status: DC

## 2019-12-09 NOTE — Addendum Note (Signed)
Addended by: Hortencia Pilar on: 12/09/2019 07:56 AM   Modules accepted: Orders

## 2019-12-19 ENCOUNTER — Encounter: Payer: Self-pay | Admitting: Family Medicine

## 2019-12-19 DIAGNOSIS — M545 Low back pain, unspecified: Secondary | ICD-10-CM

## 2019-12-20 ENCOUNTER — Telehealth: Payer: Self-pay | Admitting: Family Medicine

## 2019-12-20 DIAGNOSIS — M545 Low back pain, unspecified: Secondary | ICD-10-CM

## 2019-12-20 NOTE — Telephone Encounter (Signed)
Spoke to patient and she states she cannot wait until our next available appt on 01/16/20. Pt would like to be referred to [facility] for injection. Closing referral.

## 2019-12-20 NOTE — Telephone Encounter (Signed)
Pt called stating she was looking to get a prednisone injection and was told by Dr. Junius Roads he would get something arranged and so she was calling to see if she could go ahead and set an appt?  [phone]

## 2019-12-23 ENCOUNTER — Telehealth: Payer: Self-pay | Admitting: Family Medicine

## 2019-12-23 NOTE — Telephone Encounter (Signed)
Patient called.   Refer to last message. Still waiting on a referral to [facility] for the injection.   Call back: [phone]

## 2019-12-23 NOTE — Telephone Encounter (Signed)
Saw other note. Working on this for pt.

## 2019-12-23 NOTE — Addendum Note (Signed)
Addended by: Robyne Peers on: 12/23/2019 02:02 PM   Modules accepted: Orders

## 2019-12-23 NOTE — Telephone Encounter (Signed)
Referral placed in chart  

## 2019-12-23 NOTE — Telephone Encounter (Signed)
Noted will take care of this

## 2019-12-23 NOTE — Telephone Encounter (Signed)
See message.

## 2019-12-24 ENCOUNTER — Other Ambulatory Visit: Payer: Self-pay | Admitting: Family Medicine

## 2019-12-24 ENCOUNTER — Other Ambulatory Visit: Payer: Self-pay | Admitting: Neurological Surgery

## 2019-12-24 DIAGNOSIS — M545 Low back pain, unspecified: Secondary | ICD-10-CM

## 2019-12-25 NOTE — Telephone Encounter (Signed)
Left vm with Roberta at [facility] order in epic

## 2019-12-30 ENCOUNTER — Other Ambulatory Visit: Payer: Self-pay

## 2019-12-30 ENCOUNTER — Other Ambulatory Visit: Payer: PPO

## 2019-12-30 DIAGNOSIS — E559 Vitamin D deficiency, unspecified: Secondary | ICD-10-CM | POA: Diagnosis not present

## 2019-12-31 LAB — VITAMIN D 25 HYDROXY (VIT D DEFICIENCY, FRACTURES): Vit D, 25-Hydroxy: 38 ng/mL (ref 30.0–100.0)

## 2020-01-03 ENCOUNTER — Other Ambulatory Visit: Payer: Self-pay

## 2020-01-03 ENCOUNTER — Ambulatory Visit
Admission: RE | Admit: 2020-01-03 | Discharge: 2020-01-03 | Disposition: A | Discharge status: Home / Self Care | Payer: PPO | Source: Ambulatory Visit | Attending: Family Medicine | Admitting: Family Medicine

## 2020-01-03 ENCOUNTER — Ambulatory Visit: Payer: PPO | Admitting: Internal Medicine

## 2020-01-03 DIAGNOSIS — M48061 Spinal stenosis, lumbar region without neurogenic claudication: Secondary | ICD-10-CM | POA: Diagnosis not present

## 2020-01-03 DIAGNOSIS — M545 Low back pain, unspecified: Secondary | ICD-10-CM

## 2020-01-03 DIAGNOSIS — M4316 Spondylolisthesis, lumbar region: Secondary | ICD-10-CM | POA: Diagnosis not present

## 2020-01-03 MED ORDER — METHYLPREDNISOLONE ACETATE 40 MG/ML INJ SUSP (RADIOLOG
120.0000 mg | Freq: Once | INTRAMUSCULAR | Status: AC
  Administered 2020-01-03: 120 mg via EPIDURAL

## 2020-01-03 MED ORDER — IOPAMIDOL (ISOVUE-M 200) INJECTION 41%
1.0000 mL | Freq: Once | INTRAMUSCULAR | Status: AC
  Administered 2020-01-03: 1 mL via EPIDURAL

## 2020-01-03 NOTE — Discharge Instructions (Signed)

## 2020-01-30 DIAGNOSIS — B353 Tinea pedis: Secondary | ICD-10-CM | POA: Diagnosis not present

## 2020-01-30 DIAGNOSIS — B351 Tinea unguium: Secondary | ICD-10-CM | POA: Diagnosis not present

## 2020-01-30 DIAGNOSIS — B359 Dermatophytosis, unspecified: Secondary | ICD-10-CM | POA: Diagnosis not present

## 2020-02-04 ENCOUNTER — Other Ambulatory Visit: Payer: PPO | Admitting: Internal Medicine

## 2020-02-10 ENCOUNTER — Other Ambulatory Visit: Payer: Self-pay | Admitting: Internal Medicine

## 2020-02-10 ENCOUNTER — Other Ambulatory Visit: Payer: PPO | Admitting: Internal Medicine

## 2020-02-10 ENCOUNTER — Other Ambulatory Visit: Payer: Self-pay

## 2020-02-10 DIAGNOSIS — E7800 Pure hypercholesterolemia, unspecified: Secondary | ICD-10-CM

## 2020-02-10 LAB — LIPID PANEL
Cholesterol: 233 mg/dL — ABNORMAL HIGH (ref <200)
HDL: 125 mg/dL (ref >=50)
LDL Cholesterol (Calc): 89 mg/dL (calc)
Non-HDL Cholesterol (Calc): 108 mg/dL (calc) (ref <130)
Total CHOL/HDL Ratio: 1.9 (calc) (ref <5.0)
Triglycerides: 94 mg/dL (ref <150)

## 2020-02-10 LAB — HEPATIC FUNCTION PANEL
AG Ratio: 1.8 (calc) (ref 1.0–2.5)
ALT: 17 U/L (ref 6–29)
AST: 20 U/L (ref 10–35)
Albumin: 4.1 g/dL (ref 3.6–5.1)
Alkaline phosphatase (APISO): 49 U/L (ref 37–153)
Bilirubin, Direct: 0.1 mg/dL (ref 0.0–0.2)
Globulin: 2.3 g/dL (calc) (ref 1.9–3.7)
Indirect Bilirubin: 0.3 mg/dL (calc) (ref 0.2–1.2)
Total Bilirubin: 0.4 mg/dL (ref 0.2–1.2)
Total Protein: 6.4 g/dL (ref 6.1–8.1)

## 2020-02-11 ENCOUNTER — Encounter: Payer: Self-pay | Admitting: Family Medicine

## 2020-02-11 ENCOUNTER — Encounter: Payer: Self-pay | Admitting: Internal Medicine

## 2020-02-11 ENCOUNTER — Ambulatory Visit (INDEPENDENT_AMBULATORY_CARE_PROVIDER_SITE_OTHER): Payer: PPO | Admitting: Internal Medicine

## 2020-02-11 VITALS — BP 110/80 | HR 83 | Ht 62.0 in | Wt 137.0 lb

## 2020-02-11 DIAGNOSIS — E7800 Pure hypercholesterolemia, unspecified: Secondary | ICD-10-CM | POA: Diagnosis not present

## 2020-02-11 DIAGNOSIS — F411 Generalized anxiety disorder: Secondary | ICD-10-CM | POA: Diagnosis not present

## 2020-02-11 DIAGNOSIS — M810 Age-related osteoporosis without current pathological fracture: Secondary | ICD-10-CM | POA: Diagnosis not present

## 2020-02-11 DIAGNOSIS — G8929 Other chronic pain: Secondary | ICD-10-CM

## 2020-02-11 DIAGNOSIS — M545 Low back pain, unspecified: Secondary | ICD-10-CM

## 2020-02-11 NOTE — Progress Notes (Signed)
   Subjective:    Patient ID: Mary Garcia, female    DOB: Jul 20, 1946, 74 y.o.   MRN: [id number]  HPI 74 year old Female seen today for several issues.  Started on Crestor 5 mg 3 times a week at last visit in April.  She has developed osteoporosis and is on Boniva.  Would like for her to see endocrinologist.  She saw Dr. Junius Roads and had MRI of the LS spine.  There were central/right paracentral disc protrusions at both T10-T11 and T11-T12.  Minimal disc bulge at T12-L1.  Minimal disc bulge L1-L2 and minimal disc bulge 2-3.  L4-L5 advanced bilateral facet degenerative change with moderate severe central canal stenosis.  Mild to moderate left foraminal narrowing noted.  L5-S1 disc bulge eccentric to the right and extends into the foramen causing moderate stenosis.  She had epidural steroid injection which may have helped a little bit.  DEXA scan in March showed osteopenia in the femoral neck and osteoporosis in the lumbar spine.  She saw Dr. Maryellen Pile in April for evaluation of osteoporosis.  It was suggested she continue on Boniva.  Vitamin D level improved from 22 in March to 38 on vitamin D supplement.  Epidural steroid can be repeated according to Dr. Junius Roads.  This may be a good option for her.  She is going on a bike trip in [place] in a few weeks.  This is concerning to her because she simply does not feel well in his right paralumbar pain.  Were going to try physical therapy.  If physical therapy does not work we may have to have her see a physiatrist for other modalities.  Dr. Berdine Addison suggested possibly trying a chiropractor or even gabapentin.  He also mentions surgical consultation.  She has been traveling a lot recently and is fatigued.  This may have aggravated her right paralumbar pain.  Worse when she is bending forward into the side.  She had a small adenoma removed from that area over 20 years ago.  No tumor identified in recent MRI.    Review of Systems see above     Objective:   Physical  Exam Vital signs reviewed  No palpable mass on exam but some mild palpable tenderness in the right paralumbar area towards the thoracic area.  She is anxious about continued pain without a good reason for it.  Asking what step would be next at this point.  Have offered her several options.     Assessment & Plan:  Osteoporosis-endocrinologist wants her to continue with Boniva.  This is not causing her pain.  Vitamin D deficiency-improved on vitamin D supplement  Anxiety treated with Xanax  Chronic right paralumbar back pain.  No tumor found on recent MRI.  May have some disc disease.  Epidural steroid x1 did not help and may have repeat epidural steroid injection.  That would be my first suggestion.  If that modality does not help, I think she should try physical therapy and if that does not help I think she should see a physiatrist.  Perhaps a TENS unit would help.  30 minutes spent with patient  History of LDL elevation.  She is now on Crestor 3 times a week and her LDL is normal

## 2020-02-12 NOTE — Patient Instructions (Signed)
Try another epidural steroid injection.  If that does not help, try physical therapy.  Lipids are normal on Crestor 3 times a week.  Continue vitamin D supplement and Boniva.  If physical therapy does not work we can try physiatry.  Ask physical therapist about a TENS unit.

## 2020-02-12 NOTE — Addendum Note (Signed)
Addended by: Hortencia Pilar on: 02/12/2020 07:58 AM   Modules accepted: Orders

## 2020-02-19 ENCOUNTER — Ambulatory Visit
Admission: RE | Admit: 2020-02-19 | Discharge: 2020-02-19 | Disposition: A | Discharge status: Home / Self Care | Payer: PPO | Source: Ambulatory Visit | Attending: Family Medicine | Admitting: Family Medicine

## 2020-02-19 ENCOUNTER — Other Ambulatory Visit: Payer: Self-pay

## 2020-02-19 ENCOUNTER — Other Ambulatory Visit: Payer: Self-pay | Admitting: Family Medicine

## 2020-02-19 DIAGNOSIS — M545 Low back pain, unspecified: Secondary | ICD-10-CM

## 2020-02-19 DIAGNOSIS — M47816 Spondylosis without myelopathy or radiculopathy, lumbar region: Secondary | ICD-10-CM | POA: Diagnosis not present

## 2020-02-19 MED ORDER — METHYLPREDNISOLONE ACETATE 40 MG/ML INJ SUSP (RADIOLOG
120.0000 mg | Freq: Once | INTRAMUSCULAR | Status: AC
  Administered 2020-02-19: 120 mg via INTRA_ARTICULAR

## 2020-02-19 MED ORDER — IOPAMIDOL (ISOVUE-M 200) INJECTION 41%
1.0000 mL | Freq: Once | INTRAMUSCULAR | Status: AC
  Administered 2020-02-19: 1 mL via INTRA_ARTICULAR

## 2020-02-19 NOTE — Discharge Instructions (Signed)

## 2020-02-24 ENCOUNTER — Telehealth: Payer: Self-pay

## 2020-02-24 NOTE — Telephone Encounter (Signed)
Yes. She will need to pick up the order and arrange her own appt.

## 2020-02-24 NOTE — Telephone Encounter (Signed)
Would it be okay to change PT referral to [facility]

## 2020-02-24 NOTE — Telephone Encounter (Signed)
Patient called was referred to [facility] PT but she wants to go to [facility] can we switch that referral?

## 2020-02-26 DIAGNOSIS — M5414 Radiculopathy, thoracic region: Secondary | ICD-10-CM | POA: Diagnosis not present

## 2020-02-26 DIAGNOSIS — M5416 Radiculopathy, lumbar region: Secondary | ICD-10-CM | POA: Diagnosis not present

## 2020-02-26 NOTE — Telephone Encounter (Signed)
Mary Garcia call [facility] and they do take her Insurance and she has been there in the past so she wants to go back for this spinal issue and see sane therapist. She will come by and pick up order when it is ready.

## 2020-02-27 NOTE — Telephone Encounter (Signed)
Called patient to let her know that RX order is ready for pick.

## 2020-02-27 NOTE — Telephone Encounter (Signed)
I have written new order for PT.

## 2020-03-02 DIAGNOSIS — B353 Tinea pedis: Secondary | ICD-10-CM | POA: Diagnosis not present

## 2020-03-02 DIAGNOSIS — B351 Tinea unguium: Secondary | ICD-10-CM | POA: Diagnosis not present

## 2020-03-03 ENCOUNTER — Other Ambulatory Visit: Payer: Self-pay | Admitting: Family Medicine

## 2020-03-03 DIAGNOSIS — M5414 Radiculopathy, thoracic region: Secondary | ICD-10-CM

## 2020-03-03 NOTE — Telephone Encounter (Signed)
Mary Garcia came by to pick up RX for Pt and took to [facility] and they need order to state what body part and a diagnoses. I can put in a new referral to [facility] and fax over to them, maybe that will take care of this.

## 2020-03-03 NOTE — Telephone Encounter (Signed)
Dx is chronic right paralumbar back pain

## 2020-03-04 ENCOUNTER — Encounter: Payer: Self-pay | Admitting: Allergy

## 2020-03-04 ENCOUNTER — Ambulatory Visit: Payer: PPO | Admitting: Allergy

## 2020-03-04 ENCOUNTER — Other Ambulatory Visit: Payer: Self-pay

## 2020-03-04 VITALS — BP 110/82 | HR 77 | Temp 97.7°F | Resp 16

## 2020-03-04 DIAGNOSIS — J3089 Other allergic rhinitis: Secondary | ICD-10-CM | POA: Diagnosis not present

## 2020-03-04 DIAGNOSIS — J4530 Mild persistent asthma, uncomplicated: Secondary | ICD-10-CM

## 2020-03-04 NOTE — Assessment & Plan Note (Signed)
Past history - Perennial rhinitis symptoms for the past 8 years. 2020 skin testing was borderline positive to tree pollen.  Interim history - asymptomatic now with no medications.   Continue environmental control measures.   May use azelastine nasal spray 1-2 sprays per nostril 1-2 times a day as needed for runny nose.  May use over the counter antihistamines such as Zyrtec (cetirizine), Claritin (loratadine), Allegra (fexofenadine), or Xyzal (levocetirizine) daily as needed.

## 2020-03-04 NOTE — Patient Instructions (Signed)
Mild persistent asthma  Daily controller medication(s): continue Symbicort 80 1 puff twice a day with spacer and rinse mouth afterwards.  May use albuterol rescue inhaler 2 puffs or nebulizer every 4 to 6 hours as needed for shortness of breath, chest tightness, coughing, and wheezing. May use albuterol rescue inhaler 2 puffs 5 to 15 minutes prior to strenuous physical activities. Monitor frequency of use.   During upper respiratory infections/asthma flares: Start Symbicort 80 2 puffs twice a day for 1-2 weeks Asthma control goals:  Full participation in all desired activities (may need albuterol before activity) Albuterol use two times or less a week on average (not counting use with activity) Cough interfering with sleep two times or less a month Oral steroids no more than once a year No hospitalizations  Other allergic rhinitis 2020 skin testing was borderline positive to tree pollen.   Continue environmental control measures.   May use azelastine nasal spray 1-2 sprays per nostril 1-2 times a day as needed for runny nose.  May use over the counter antihistamines such as Zyrtec (cetirizine), Claritin (loratadine), Allegra (fexofenadine), or Xyzal (levocetirizine) daily as needed.  Follow up in 4 months or sooner if needed.  Reducing Pollen Exposure . Pollen seasons: trees (spring), grass (summer) and ragweed/weeds (fall). Marland Kitchen Keep windows closed in your home and car to lower pollen exposure.  Susa Simmonds air conditioning in the bedroom and throughout the house if possible.  . Avoid going out in dry windy days - especially early morning. . Pollen counts are highest between 5 - 10 AM and on dry, hot and windy days.  . Save outside activities for late afternoon or after a heavy rain, when pollen levels are lower.  . Avoid mowing of grass if you have grass pollen allergy. Marland Kitchen Be aware that pollen can also be transported indoors on people and pets.  . Dry your clothes in an automatic dryer  rather than hanging them outside where they might collect pollen.  . Rinse hair and eyes before bedtime.

## 2020-03-04 NOTE — Assessment & Plan Note (Signed)
Past history - Issues with coughing, wheezing and SOB for the past 3-4 years with worsening during URIs. Used to be on Flovent, Qvar in the past. Tried PPIs in the past with no benefit. 2020 skin testing showed: Borderline positive to maple tree pollen. 2020 spirometry showed: mild possible restrictive disease with 14% improvement in FEV post bronchodilator treatment.  Interim history - stable with below regimen. Usually flares in October/November.   Today's ACT score 25.  Today's spirometry showed mild restriction.   Daily controller medication(s): continue Symbicort 80 mcg 1 puff twice a day with spacer and rinse mouth afterwards.  May use albuterol rescue inhaler 2 puffs or nebulizer every 4 to 6 hours as needed for shortness of breath, chest tightness, coughing, and wheezing. May use albuterol rescue inhaler 2 puffs 5 to 15 minutes prior to strenuous physical activities. Monitor frequency of use.   During upper respiratory infections/asthma flares: Start Symbicort 80 mcg 2 puffs twice a day for 1-2 weeks.

## 2020-03-04 NOTE — Progress Notes (Signed)
Follow Up Note  RE: Mary Garcia MRN: [id number] DOB: July 18, 1946 Date of Office Visit: 03/04/2020  Referring provider: Elby Showers, MD Primary care provider: Elby Showers, MD  Chief Complaint: Asthma and Allergic Rhinitis   History of Present Illness: I had the pleasure of seeing Mary Garcia for a follow up visit at the [facility] on 03/04/2020. She is a 74 y.o. female, who is being followed for asthma, allergic rhinitis. Her previous allergy office visit was on 10/30/2019 with Dr. Maudie Mercury. Today is a regular follow up visit.  Mild persistent asthma Taking Symbicort 80 1 puff twice a day with good benefit. Denies any SOB, coughing, wheezing, chest tightness, nocturnal awakenings, ER/urgent care visits use since the last visit. No albuterol use since the last visit.   Took prednisone for back pain which did not help.   Other allergic rhinitis Stable with no medications.   Assessment and Plan: Mary Garcia is a 74 y.o. female with: Mild persistent asthma without complication Past history - Issues with coughing, wheezing and SOB for the past 3-4 years with worsening during URIs. Used to be on Flovent, Qvar in the past. Tried PPIs in the past with no benefit. 2020 skin testing showed: Borderline positive to maple tree pollen. 2020 spirometry showed: mild possible restrictive disease with 14% improvement in FEV post bronchodilator treatment.  Interim history - stable with below regimen. Usually flares in October/November.   Today's ACT score 25.  Today's spirometry showed mild restriction.   Daily controller medication(s): continue Symbicort 80 mcg 1 puff twice a day with spacer and rinse mouth afterwards.  May use albuterol rescue inhaler 2 puffs or nebulizer every 4 to 6 hours as needed for shortness of breath, chest tightness, coughing, and wheezing. May use albuterol rescue inhaler 2 puffs 5 to 15 minutes prior to strenuous physical activities. Monitor frequency of use.    During upper respiratory infections/asthma flares: Start Symbicort 80 mcg 2 puffs twice a day for 1-2 weeks.  Other allergic rhinitis Past history - Perennial rhinitis symptoms for the past 8 years. 2020 skin testing was borderline positive to tree pollen.  Interim history - asymptomatic now with no medications.   Continue environmental control measures.   May use azelastine nasal spray 1-2 sprays per nostril 1-2 times a day as needed for runny nose.  May use over the counter antihistamines such as Zyrtec (cetirizine), Claritin (loratadine), Allegra (fexofenadine), or Xyzal (levocetirizine) daily as needed.  Return in about 4 months (around 07/05/2020).  Diagnostics: Spirometry:  Tracings reviewed. Her effort: Good reproducible efforts. FVC: 2.20L FEV1: 1.66L, 78% predicted FEV1/FVC ratio: 75% Interpretation: Spirometry consistent with possible restrictive disease.  Please see scanned spirometry results for details.  Medication List:  Current Outpatient Medications  Medication Sig Dispense Refill  . ALPRAZolam (XANAX) 0.25 MG tablet Take 1 tablet (0.25 mg total) by mouth 2 (two) times daily as needed for anxiety. 20 tablet 0  . azelastine (ASTELIN) 0.1 % nasal spray Place 1 spray into both nostrils 2 (two) times daily as needed for rhinitis. Use in each nostril as directed 30 mL 5  . budesonide-formoterol (SYMBICORT) 80-4.5 MCG/ACT inhaler Inhale 2 puffs into the lungs 2 (two) times daily. (Patient taking differently: Inhale 1 puff into the lungs 2 (two) times daily. ) 1 Inhaler 5  . cyclobenzaprine (FLEXERIL) 10 MG tablet Take 1 tablet (10 mg total) by mouth at bedtime. 30 tablet 0  . dicyclomine (BENTYL) 10 MG capsule Take  1 capsule (10 mg total) by mouth 4 (four) times daily -  before meals and at bedtime. 30 capsule 1  . diphenhydramine-acetaminophen (TYLENOL PM) 25-500 MG TABS Take 1 tablet by mouth at bedtime as needed (pain).    Marland Kitchen ELDERBERRY PO Take by mouth.    .  ibandronate (BONIVA) 150 MG tablet Take 1 tablet (150 mg total) by mouth every 30 (thirty) days. Take in the morning with a full glass of water, on an empty stomach, and do not take anything else by mouth or lie down for the next 30 min. 1 tablet 5  . PROAIR RESPICLICK 824 (90 Base) MCG/ACT AEPB INHALE 2 PUFFS BY MOUTH 4 TIMES DAILY 1 each prn  . rosuvastatin (CRESTOR) 5 MG tablet Take 1 tablet 3 times weekly with supper 36 tablet 3  . Vitamin D, Cholecalciferol, 1000 UNITS TABS Take 1 tablet by mouth daily.    Marland Kitchen zolpidem (AMBIEN) 10 MG tablet TAKE 1 TABLET BY MOUTH EVERY DAY AT BEDTIME AS NEEDED FOR SLEEP 90 tablet 0   No current facility-administered medications for this visit.   Allergies: No Known Allergies I reviewed her past medical history, social history, family history, and environmental history and no significant changes have been reported from her previous visit.  Review of Systems  Constitutional: Negative for appetite change, chills, fever and unexpected weight change.  HENT: Negative for congestion and rhinorrhea.   Eyes: Negative for itching.  Respiratory: Negative for cough, chest tightness, shortness of breath and wheezing.   Cardiovascular: Negative for chest pain.  Gastrointestinal: Negative for abdominal pain.  Genitourinary: Negative for difficulty urinating.  Skin: Negative for rash.  Allergic/Immunologic: Positive for environmental allergies. Negative for food allergies.  Neurological: Negative for headaches.   Objective: BP 110/82   Pulse 77   Temp 97.7 F (36.5 C) (Temporal)   Resp 16   SpO2 98%  There is no height or weight on file to calculate BMI. Physical Exam Vitals and nursing note reviewed.  Constitutional:      Appearance: Normal appearance. She is well-developed.  HENT:     Head: Normocephalic and atraumatic.     Right Ear: Tympanic membrane and external ear normal.     Left Ear: Tympanic membrane and external ear normal.     Nose: Nose  normal. No congestion or rhinorrhea.     Mouth/Throat:     Mouth: Mucous membranes are moist.     Pharynx: Oropharynx is clear.  Eyes:     Conjunctiva/sclera: Conjunctivae normal.  Cardiovascular:     Rate and Rhythm: Normal rate and regular rhythm.     Heart sounds: Normal heart sounds. No murmur heard.  No friction rub. No gallop.   Pulmonary:     Effort: Pulmonary effort is normal.     Breath sounds: Normal breath sounds. No wheezing or rales.  Musculoskeletal:     Cervical back: Neck supple.  Skin:    General: Skin is warm.     Findings: No rash.  Neurological:     Mental Status: She is alert and oriented to person, place, and time.  Psychiatric:        Mood and Affect: Mood normal.        Behavior: Behavior normal.    Previous notes and tests were reviewed. The plan was reviewed with the patient/family, and all questions/concerned were addressed.  It was my pleasure to see Artemisia today and participate in her care. Please feel free to contact me with  any questions or concerns.  Sincerely,  Rexene Alberts, DO Allergy & Immunology  [facility] office: [phone] [facility] office: [place] office: [phone]

## 2020-03-18 DIAGNOSIS — M545 Low back pain: Secondary | ICD-10-CM | POA: Diagnosis not present

## 2020-03-18 DIAGNOSIS — M546 Pain in thoracic spine: Secondary | ICD-10-CM | POA: Diagnosis not present

## 2020-03-19 ENCOUNTER — Other Ambulatory Visit: Payer: Self-pay

## 2020-03-19 ENCOUNTER — Telehealth: Payer: Self-pay | Admitting: Internal Medicine

## 2020-03-19 ENCOUNTER — Ambulatory Visit (INDEPENDENT_AMBULATORY_CARE_PROVIDER_SITE_OTHER): Payer: PPO | Admitting: Internal Medicine

## 2020-03-19 ENCOUNTER — Encounter: Payer: Self-pay | Admitting: Internal Medicine

## 2020-03-19 VITALS — BP 120/80 | HR 92 | Ht 62.0 in | Wt 136.0 lb

## 2020-03-19 DIAGNOSIS — E7800 Pure hypercholesterolemia, unspecified: Secondary | ICD-10-CM

## 2020-03-19 DIAGNOSIS — M5416 Radiculopathy, lumbar region: Secondary | ICD-10-CM | POA: Diagnosis not present

## 2020-03-19 DIAGNOSIS — F411 Generalized anxiety disorder: Secondary | ICD-10-CM

## 2020-03-19 DIAGNOSIS — M810 Age-related osteoporosis without current pathological fracture: Secondary | ICD-10-CM | POA: Diagnosis not present

## 2020-03-19 MED ORDER — HYDROCODONE-ACETAMINOPHEN 5-325 MG PO TABS: 1.0000 | ORAL_TABLET | Freq: Three times a day (TID) | ORAL | 0 refills | Status: DC | PRN

## 2020-03-19 MED ORDER — PREDNISONE 10 MG PO TABS
ORAL_TABLET | ORAL | 0 refills | Status: DC
Start: 2020-03-19 — End: 2020-07-06

## 2020-03-19 MED ORDER — PREGABALIN 50 MG PO CAPS: 50.0000 mg | ORAL_CAPSULE | Freq: Two times a day (BID) | ORAL | 1 refills | Status: DC

## 2020-03-19 NOTE — Patient Instructions (Addendum)
Neurosurgery referral. Take prednisone 10 mg tablets in tapering course as directed 5-4-3-2-1.  Take hydrocodone APAP sparingly for pain.  Start Lyrica 50 mg twice daily if tolerated.

## 2020-03-19 NOTE — Telephone Encounter (Signed)
scheduled
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
## 2020-03-19 NOTE — Telephone Encounter (Signed)
Mary Garcia [phone]  Natale called to say since Sunday she has been having right hip pain that is running down her leg to her ankle and it is keeping her up at night. She has been unable to sleep even with her Ambien.

## 2020-03-19 NOTE — Telephone Encounter (Signed)
Schedule OV

## 2020-03-24 ENCOUNTER — Other Ambulatory Visit: Payer: PPO | Admitting: Internal Medicine

## 2020-03-24 ENCOUNTER — Other Ambulatory Visit: Payer: Self-pay

## 2020-03-24 DIAGNOSIS — M5416 Radiculopathy, lumbar region: Secondary | ICD-10-CM | POA: Diagnosis not present

## 2020-03-24 NOTE — Telephone Encounter (Signed)
Mary Garcia came in for her labs today, and she wanted you to know that she is so much better. She don't know which medication help the most the prednisone or the Lyrica.

## 2020-03-26 LAB — PROTEIN ELECTROPHORESIS, SERUM
Albumin ELP: 4.4 g/dL (ref 3.8–4.8)
Alpha 1: 0.3 g/dL (ref 0.2–0.3)
Alpha 2: 0.6 g/dL (ref 0.5–0.9)
Beta 2: 0.3 g/dL (ref 0.2–0.5)
Beta Globulin: 0.5 g/dL (ref 0.4–0.6)
Gamma Globulin: 0.7 g/dL — ABNORMAL LOW (ref 0.8–1.7)
Total Protein: 6.7 g/dL (ref 6.1–8.1)

## 2020-03-26 LAB — SEDIMENTATION RATE: Sed Rate: 2 mm/h (ref 0–30)

## 2020-03-27 ENCOUNTER — Other Ambulatory Visit: Payer: Self-pay

## 2020-03-27 ENCOUNTER — Ambulatory Visit
Admission: RE | Admit: 2020-03-27 | Discharge: 2020-03-27 | Disposition: A | Discharge status: Home / Self Care | Payer: PPO | Source: Ambulatory Visit | Attending: Family Medicine | Admitting: Family Medicine

## 2020-03-27 DIAGNOSIS — M5414 Radiculopathy, thoracic region: Secondary | ICD-10-CM

## 2020-03-27 DIAGNOSIS — M40204 Unspecified kyphosis, thoracic region: Secondary | ICD-10-CM | POA: Diagnosis not present

## 2020-03-27 DIAGNOSIS — M4314 Spondylolisthesis, thoracic region: Secondary | ICD-10-CM | POA: Diagnosis not present

## 2020-03-27 DIAGNOSIS — M4724 Other spondylosis with radiculopathy, thoracic region: Secondary | ICD-10-CM | POA: Diagnosis not present

## 2020-03-27 DIAGNOSIS — M4804 Spinal stenosis, thoracic region: Secondary | ICD-10-CM | POA: Diagnosis not present

## 2020-03-31 DIAGNOSIS — M546 Pain in thoracic spine: Secondary | ICD-10-CM | POA: Diagnosis not present

## 2020-04-03 DIAGNOSIS — M546 Pain in thoracic spine: Secondary | ICD-10-CM | POA: Diagnosis not present

## 2020-04-07 DIAGNOSIS — R030 Elevated blood-pressure reading, without diagnosis of hypertension: Secondary | ICD-10-CM | POA: Diagnosis not present

## 2020-04-07 DIAGNOSIS — M4316 Spondylolisthesis, lumbar region: Secondary | ICD-10-CM | POA: Diagnosis not present

## 2020-04-07 DIAGNOSIS — M5134 Other intervertebral disc degeneration, thoracic region: Secondary | ICD-10-CM | POA: Diagnosis not present

## 2020-04-10 DIAGNOSIS — M546 Pain in thoracic spine: Secondary | ICD-10-CM | POA: Diagnosis not present

## 2020-04-10 DIAGNOSIS — M545 Low back pain: Secondary | ICD-10-CM | POA: Diagnosis not present

## 2020-04-16 ENCOUNTER — Other Ambulatory Visit: Payer: Self-pay | Admitting: Family Medicine

## 2020-04-16 ENCOUNTER — Telehealth: Payer: Self-pay | Admitting: *Deleted

## 2020-04-16 DIAGNOSIS — M545 Low back pain, unspecified: Secondary | ICD-10-CM

## 2020-04-16 NOTE — Telephone Encounter (Signed)
Received call from [facility] with [facility] stating this pt had Lumbar Facet L4-5 injection on 02/19/20 but order was not placed, she had sent out numerous faxes needing a doc signature never got anything back. Order has been placed while on phone with [facility].

## 2020-04-22 DIAGNOSIS — M546 Pain in thoracic spine: Secondary | ICD-10-CM | POA: Diagnosis not present

## 2020-04-22 DIAGNOSIS — M545 Low back pain: Secondary | ICD-10-CM | POA: Diagnosis not present

## 2020-04-28 DIAGNOSIS — M546 Pain in thoracic spine: Secondary | ICD-10-CM | POA: Diagnosis not present

## 2020-05-05 ENCOUNTER — Other Ambulatory Visit: Payer: Self-pay | Admitting: Internal Medicine

## 2020-05-08 ENCOUNTER — Encounter: Payer: Self-pay | Admitting: Internal Medicine

## 2020-05-08 ENCOUNTER — Other Ambulatory Visit: Payer: Self-pay

## 2020-05-08 ENCOUNTER — Ambulatory Visit (INDEPENDENT_AMBULATORY_CARE_PROVIDER_SITE_OTHER): Payer: PPO | Admitting: Internal Medicine

## 2020-05-08 VITALS — BP 100/80 | HR 82 | Temp 98.6°F | Ht 62.0 in | Wt 136.0 lb

## 2020-05-08 DIAGNOSIS — Z23 Encounter for immunization: Secondary | ICD-10-CM | POA: Diagnosis not present

## 2020-05-08 NOTE — Patient Instructions (Signed)
Patient received a flu vaccine IM L deltoid, AV, CMA  

## 2020-05-08 NOTE — Progress Notes (Signed)
Flu vaccine given by CMA 

## 2020-05-11 DIAGNOSIS — Z20822 Contact with and (suspected) exposure to covid-19: Secondary | ICD-10-CM | POA: Diagnosis not present

## 2020-06-10 ENCOUNTER — Other Ambulatory Visit: Payer: Self-pay | Admitting: Internal Medicine

## 2020-06-30 DIAGNOSIS — Z20822 Contact with and (suspected) exposure to covid-19: Secondary | ICD-10-CM | POA: Diagnosis not present

## 2020-07-01 DIAGNOSIS — D2272 Melanocytic nevi of left lower limb, including hip: Secondary | ICD-10-CM | POA: Diagnosis not present

## 2020-07-01 DIAGNOSIS — D225 Melanocytic nevi of trunk: Secondary | ICD-10-CM | POA: Diagnosis not present

## 2020-07-01 DIAGNOSIS — B351 Tinea unguium: Secondary | ICD-10-CM | POA: Diagnosis not present

## 2020-07-01 DIAGNOSIS — B354 Tinea corporis: Secondary | ICD-10-CM | POA: Diagnosis not present

## 2020-07-01 DIAGNOSIS — D2361 Other benign neoplasm of skin of right upper limb, including shoulder: Secondary | ICD-10-CM | POA: Diagnosis not present

## 2020-07-01 DIAGNOSIS — L821 Other seborrheic keratosis: Secondary | ICD-10-CM | POA: Diagnosis not present

## 2020-07-01 DIAGNOSIS — L578 Other skin changes due to chronic exposure to nonionizing radiation: Secondary | ICD-10-CM | POA: Diagnosis not present

## 2020-07-01 DIAGNOSIS — D2230 Melanocytic nevi of unspecified part of face: Secondary | ICD-10-CM | POA: Diagnosis not present

## 2020-07-02 ENCOUNTER — Telehealth: Payer: Self-pay | Admitting: Internal Medicine

## 2020-07-02 MED ORDER — IBANDRONATE SODIUM 150 MG PO TABS
150.0000 mg | ORAL_TABLET | ORAL | 11 refills | Status: DC
Start: 2020-07-02 — End: 2021-06-28

## 2020-07-02 NOTE — Telephone Encounter (Signed)
Received Fax RX request from  [place], [place] Phone:  [phone]  Fax:  [phone]       Medication - ibandronate (BONIVA) 150 MG tablet  Last Refill - 05/21/2020  Last OV - 03/19/20  Last CPE - 11/05/19  Next Appointment -

## 2020-07-02 NOTE — Telephone Encounter (Signed)
Refill x one year °

## 2020-07-06 ENCOUNTER — Ambulatory Visit: Payer: PPO | Admitting: Allergy

## 2020-07-06 ENCOUNTER — Other Ambulatory Visit: Payer: Self-pay

## 2020-07-06 ENCOUNTER — Encounter: Payer: Self-pay | Admitting: Allergy

## 2020-07-06 VITALS — BP 120/80 | HR 81 | Temp 97.6°F | Resp 16

## 2020-07-06 DIAGNOSIS — J4530 Mild persistent asthma, uncomplicated: Secondary | ICD-10-CM

## 2020-07-06 DIAGNOSIS — J3089 Other allergic rhinitis: Secondary | ICD-10-CM

## 2020-07-06 NOTE — Patient Instructions (Addendum)
Mild persistent asthma  Daily controller medication(s): continue Symbicort 54mcg 1 puff twice a day with spacer and rinse mouth afterwards.  May use albuterol rescue inhaler 2 puffs or nebulizer every 4 to 6 hours as needed for shortness of breath, chest tightness, coughing, and wheezing. May use albuterol rescue inhaler 2 puffs 5 to 15 minutes prior to strenuous physical activities. Monitor frequency of use.   During upper respiratory infections/asthma flares: Start Symbicort 66mcg 2 puffs twice a day for 1-2 weeks Asthma control goals:  Full participation in all desired activities (may need albuterol before activity) Albuterol use two times or less a week on average (not counting use with activity) Cough interfering with sleep two times or less a month Oral steroids no more than once a year No hospitalizations  Other allergic rhinitis 2020 skin testing was borderline positive to tree pollen.   Continue environmental control measures.   May use azelastine nasal spray 1-2 sprays per nostril 1-2 times a day as needed for runny nose.  May use over the counter antihistamines such as Zyrtec (cetirizine), Claritin (loratadine), Allegra (fexofenadine), or Xyzal (levocetirizine) daily as needed. May take twice a day during flares.   Follow up in 5 months or sooner if needed.  Reducing Pollen Exposure . Pollen seasons: trees (spring), grass (summer) and ragweed/weeds (fall). Marland Kitchen Keep windows closed in your home and car to lower pollen exposure.  Susa Simmonds air conditioning in the bedroom and throughout the house if possible.  . Avoid going out in dry windy days - especially early morning. . Pollen counts are highest between 5 - 10 AM and on dry, hot and windy days.  . Save outside activities for late afternoon or after a heavy rain, when pollen levels are lower.  . Avoid mowing of grass if you have grass pollen allergy. Marland Kitchen Be aware that pollen can also be transported indoors on people and pets.   . Dry your clothes in an automatic dryer rather than hanging them outside where they might collect pollen.  . Rinse hair and eyes before bedtime.

## 2020-07-06 NOTE — Assessment & Plan Note (Signed)
Past history - Perennial rhinitis symptoms for the past 8 years. 2020 skin testing was borderline positive to tree pollen.  Interim history - asymptomatic with no daily medications.   Continue environmental control measures.   May use azelastine nasal spray 1-2 sprays per nostril 1-2 times a day as needed for runny nose.  May use over the counter antihistamines such as Zyrtec (cetirizine), Claritin (loratadine), Allegra (fexofenadine), or Xyzal (levocetirizine) daily as needed. May take twice a day during flares.

## 2020-07-06 NOTE — Assessment & Plan Note (Addendum)
Past history - Issues with coughing, wheezing and SOB for the past 3-4 years with worsening during URIs. Used to be on Flovent, Qvar in the past. Tried PPIs in the past with no benefit. 2020 skin testing showed: Borderline positive to maple tree pollen. 2020 spirometry showed: mild possible restrictive disease with 14% improvement in FEV post bronchodilator treatment.  Interim history - stable with below regimen and no flares since last visit.   Today's ACT score 23.  Daily controller medication(s): continue Symbicort 40mcg 1 puff twice a day with spacer and rinse mouth afterwards.  May use albuterol rescue inhaler 2 puffs or nebulizer every 4 to 6 hours as needed for shortness of breath, chest tightness, coughing, and wheezing. May use albuterol rescue inhaler 2 puffs 5 to 15 minutes prior to strenuous physical activities. Monitor frequency of use.   During upper respiratory infections/asthma flares: Start Symbicort 64mcg 2 puffs twice a day for 1-2 weeks.  Will get spirometry at next visit instead of today due to COVID-19 pandemic and trying to minimize any type of aerosolizing procedures at this time in the office.

## 2020-07-06 NOTE — Progress Notes (Signed)
Follow Up Note  RE: Mary Garcia MRN: [id number] DOB: 1946/07/19 Date of Office Visit: 07/06/2020  Referring provider: Elby Showers, MD Primary care provider: Elby Showers, MD  Chief Complaint: Follow-up  History of Present Illness: I had the pleasure of seeing Mary Garcia for a follow up visit at the [facility] on 07/06/2020. She is a 74 y.o. female, who is being followed for asthma, allergic rhinitis. Her previous allergy office visit was on 03/04/2020 with Dr. Maudie Mercury. Today is a regular follow up visit.  Asthma: Denies any SOB, coughing, wheezing, chest tightness, nocturnal awakenings, ER/urgent care visits or prednisone use since the last visit. Currently on Symbicort 33mcg 1 puff twice a day. Used albuterol once a few weeks ago with good benefit. She had some rhinitis symptoms as well.   Allergic rhinitis Not taking any daily medications. Using azelastine nasal spray as needed.  Assessment and Plan: [name] is a 74 y.o. female with: Mild persistent asthma without complication Past history - Issues with coughing, wheezing and SOB for the past 3-4 years with worsening during URIs. Used to be on Flovent, Qvar in the past. Tried PPIs in the past with no benefit. 2020 skin testing showed: Borderline positive to maple tree pollen. 2020 spirometry showed: mild possible restrictive disease with 14% improvement in FEV post bronchodilator treatment.  Interim history - stable with below regimen and no flares since last visit.   Today's ACT score 23.  Daily controller medication(s): continue Symbicort 88mcg 1 puff twice a day with spacer and rinse mouth afterwards.  May use albuterol rescue inhaler 2 puffs or nebulizer every 4 to 6 hours as needed for shortness of breath, chest tightness, coughing, and wheezing. May use albuterol rescue inhaler 2 puffs 5 to 15 minutes prior to strenuous physical activities. Monitor frequency of use.   During upper respiratory  infections/asthma flares: Start Symbicort 42mcg 2 puffs twice a day for 1-2 weeks.  Will get spirometry at next visit instead of today due to COVID-19 pandemic and trying to minimize any type of aerosolizing procedures at this time in the office.   Other allergic rhinitis Past history - Perennial rhinitis symptoms for the past 8 years. 2020 skin testing was borderline positive to tree pollen.  Interim history - asymptomatic with no daily medications.   Continue environmental control measures.   May use azelastine nasal spray 1-2 sprays per nostril 1-2 times a day as needed for runny nose.  May use over the counter antihistamines such as Zyrtec (cetirizine), Claritin (loratadine), Allegra (fexofenadine), or Xyzal (levocetirizine) daily as needed. May take twice a day during flares.   Return in about 5 months (around 12/04/2020).  Diagnostics: None.  Medication List:  Current Outpatient Medications  Medication Sig Dispense Refill  . ALPRAZolam (XANAX) 0.25 MG tablet Take 1 tablet (0.25 mg total) by mouth 2 (two) times daily as needed for anxiety. 20 tablet 0  . azelastine (ASTELIN) 0.1 % nasal spray Place 1 spray into both nostrils 2 (two) times daily as needed for rhinitis. Use in each nostril as directed 30 mL 5  . budesonide-formoterol (SYMBICORT) 80-4.5 MCG/ACT inhaler Inhale 2 puffs into the lungs 2 (two) times daily. (Patient taking differently: Inhale 1 puff into the lungs 2 (two) times daily. ) 1 Inhaler 5  . dicyclomine (BENTYL) 10 MG capsule Take 1 capsule (10 mg total) by mouth 4 (four) times daily -  before meals and at bedtime. 30 capsule 1  .  ELDERBERRY PO Take by mouth.    . ibandronate (BONIVA) 150 MG tablet Take 1 tablet (150 mg total) by mouth every 30 (thirty) days. Take in the morning with a full glass of water, on an empty stomach, and do not take anything else by mouth or lie down for the next 30 min. 1 tablet 11  . pregabalin (LYRICA) 50 MG capsule Take 1 capsule by  mouth twice daily 180 capsule 0  . PROAIR RESPICLICK 500 (90 Base) MCG/ACT AEPB INHALE 2 PUFFS BY MOUTH 4 TIMES DAILY 1 each prn  . Vitamin D, Cholecalciferol, 1000 UNITS TABS Take 1 tablet by mouth daily.    Marland Kitchen zolpidem (AMBIEN) 10 MG tablet TAKE 1 TABLET BY MOUTH EVERY DAY AT BEDTIME AS NEEDED FOR SLEEP 90 tablet 0   No current facility-administered medications for this visit.   Allergies: No Known Allergies I reviewed her past medical history, social history, family history, and environmental history and no significant changes have been reported from her previous visit.  Review of Systems  Constitutional: Negative for appetite change, chills, fever and unexpected weight change.  HENT: Negative for congestion and rhinorrhea.   Eyes: Negative for itching.  Respiratory: Negative for cough, chest tightness, shortness of breath and wheezing.   Cardiovascular: Negative for chest pain.  Gastrointestinal: Negative for abdominal pain.  Genitourinary: Negative for difficulty urinating.  Skin: Negative for rash.  Allergic/Immunologic: Positive for environmental allergies. Negative for food allergies.  Neurological: Negative for headaches.   Objective: BP 120/80 (BP Location: Left Arm, Patient Position: Sitting, Cuff Size: Normal)   Pulse 81   Temp 97.6 F (36.4 C) (Temporal)   Resp 16   SpO2 97%  There is no height or weight on file to calculate BMI. Physical Exam Vitals and nursing note reviewed.  Constitutional:      Appearance: Normal appearance. She is well-developed.  HENT:     Head: Normocephalic and atraumatic.     Right Ear: Tympanic membrane and external ear normal.     Left Ear: Tympanic membrane and external ear normal.     Nose: Nose normal. No congestion or rhinorrhea.     Mouth/Throat:     Mouth: Mucous membranes are moist.     Pharynx: Oropharynx is clear.  Eyes:     Conjunctiva/sclera: Conjunctivae normal.  Cardiovascular:     Rate and Rhythm: Normal rate and  regular rhythm.     Heart sounds: Normal heart sounds. No murmur heard.  No friction rub. No gallop.   Pulmonary:     Effort: Pulmonary effort is normal.     Breath sounds: Normal breath sounds. No wheezing or rales.  Musculoskeletal:     Cervical back: Neck supple.  Skin:    General: Skin is warm.     Findings: No rash.  Neurological:     Mental Status: She is alert and oriented to person, place, and time.  Psychiatric:        Mood and Affect: Mood normal.        Behavior: Behavior normal.    Previous notes and tests were reviewed. The plan was reviewed with the patient/family, and all questions/concerned were addressed.  It was my pleasure to see Zyan today and participate in her care. Please feel free to contact me with any questions or concerns.  Sincerely,  Rexene Alberts, DO Allergy & Immunology  [facility] office: [place] office: [phone]

## 2020-07-30 ENCOUNTER — Other Ambulatory Visit: Payer: Self-pay | Admitting: Internal Medicine

## 2020-07-30 NOTE — Telephone Encounter (Signed)
This encounter was created in error - please disregard.

## 2020-08-04 ENCOUNTER — Encounter: Payer: Self-pay | Admitting: Internal Medicine

## 2020-08-04 ENCOUNTER — Telehealth: Payer: Self-pay | Admitting: Internal Medicine

## 2020-08-04 ENCOUNTER — Ambulatory Visit (INDEPENDENT_AMBULATORY_CARE_PROVIDER_SITE_OTHER): Payer: PPO | Admitting: Internal Medicine

## 2020-08-04 ENCOUNTER — Other Ambulatory Visit: Payer: Self-pay

## 2020-08-04 VITALS — BP 110/60 | HR 77 | Temp 97.7°F | Ht 62.0 in | Wt 140.0 lb

## 2020-08-04 DIAGNOSIS — G44209 Tension-type headache, unspecified, not intractable: Secondary | ICD-10-CM | POA: Diagnosis not present

## 2020-08-04 MED ORDER — ALPRAZOLAM 0.25 MG PO TABS: 0.2500 mg | ORAL_TABLET | Freq: Two times a day (BID) | ORAL | 0 refills | Status: DC | PRN

## 2020-08-04 MED ORDER — PREDNISONE 10 MG PO TABS: ORAL_TABLET | ORAL | 0 refills | Status: DC

## 2020-08-04 MED ORDER — HYDROCODONE-ACETAMINOPHEN 5-325 MG PO TABS: 1.0000 | ORAL_TABLET | Freq: Three times a day (TID) | ORAL | 0 refills | Status: DC | PRN

## 2020-08-04 NOTE — Telephone Encounter (Signed)
Mary Garcia [phone]  Mary Garcia called to say she has had a horrible headache for the last 6 days, nothing is really working. She has taking tylenol and ibuprofen. The only thing that has eased it a little is she took a half of Xanax and it eased a little bit.

## 2020-08-04 NOTE — Telephone Encounter (Signed)
Needs OV.  

## 2020-08-04 NOTE — Progress Notes (Signed)
   Subjective:    Patient ID: Mary Garcia, female    DOB: June 10, 1946, 74 y.o.   MRN: [id number]  HPI  74 year old Female seen for 6 day headache in occipital area. Denies excessive stress. Loud noise and light aggravates symptoms.  No relief with Tylenol or ibuprofen.  Some relief with half of a Xanax tablet.  No visual disturbances.  Patient seen in May 2020 via virtual visit with 2-week history of occipital headache that she thought was a migraine headache.  No nausea or vomiting with that headache.  Had some soreness in her neck.  Was treated with a tapering course of prednisone and Norco and improved.    Review of Systems no vomiting. No nausea-denies excessive stress     Objective:   Physical Exam She is afebrile.  Vital signs reviewed.  Blood pressure 110/60.  Skin is warm and dry.  No cervical adenopathy.  PERRLA.  Extraocular movements are full.  Muscle strength is normal in the upper and lower extremities.  Cranial nerves II through XII are grossly intact.  Tenderness in the occipital neck area consistent with muscle contraction headache.  No focal deficits on brief neurological exam.       Assessment & Plan:  Muscle contraction headache  Anxiety state  Plan: Xanax 0.25 mg twice daily as needed for anxiety.  Norco 5/325 every 8 hours as needed for headache.  Take prednisone 10 mg tablets in tapering course starting with 6 tablets day 1 and decreasing by 10 mg daily i.e. 6-5-4-3-2-1 taper.  Call if not better in 24 to 48 hours or sooner if worse.

## 2020-08-04 NOTE — Telephone Encounter (Signed)
Scheduled

## 2020-08-06 DIAGNOSIS — M65331 Trigger finger, right middle finger: Secondary | ICD-10-CM | POA: Diagnosis not present

## 2020-08-26 DIAGNOSIS — Z1231 Encounter for screening mammogram for malignant neoplasm of breast: Secondary | ICD-10-CM | POA: Diagnosis not present

## 2020-08-26 LAB — HM MAMMOGRAPHY

## 2020-08-28 ENCOUNTER — Other Ambulatory Visit: Payer: Self-pay | Admitting: Allergy

## 2020-08-28 ENCOUNTER — Encounter: Payer: Self-pay | Admitting: Internal Medicine

## 2020-09-08 DIAGNOSIS — M25562 Pain in left knee: Secondary | ICD-10-CM | POA: Diagnosis not present

## 2020-09-08 DIAGNOSIS — M23304 Other meniscus derangements, unspecified medial meniscus, left knee: Secondary | ICD-10-CM | POA: Diagnosis not present

## 2020-09-13 NOTE — Patient Instructions (Addendum)
Take Xanax 0.25 mg twice daily as needed for anxiety.  Norco 5/325 to take sparingly every 8 hours as needed for headache.  Take prednisone 10 mg tablets in tapering course as directed starting with 6 tablets day 1 and decreasing by 1 tablet daily.  Call if not better in 24 to 48 hours or sooner if worse.

## 2020-09-16 DIAGNOSIS — M25562 Pain in left knee: Secondary | ICD-10-CM | POA: Diagnosis not present

## 2020-10-12 DIAGNOSIS — H2513 Age-related nuclear cataract, bilateral: Secondary | ICD-10-CM | POA: Diagnosis not present

## 2020-10-12 DIAGNOSIS — H43391 Other vitreous opacities, right eye: Secondary | ICD-10-CM | POA: Diagnosis not present

## 2020-10-12 DIAGNOSIS — H35033 Hypertensive retinopathy, bilateral: Secondary | ICD-10-CM | POA: Diagnosis not present

## 2020-10-12 DIAGNOSIS — H25013 Cortical age-related cataract, bilateral: Secondary | ICD-10-CM | POA: Diagnosis not present

## 2020-10-12 DIAGNOSIS — H2512 Age-related nuclear cataract, left eye: Secondary | ICD-10-CM | POA: Diagnosis not present

## 2020-10-12 DIAGNOSIS — H524 Presbyopia: Secondary | ICD-10-CM | POA: Diagnosis not present

## 2020-10-12 DIAGNOSIS — H52213 Irregular astigmatism, bilateral: Secondary | ICD-10-CM | POA: Diagnosis not present

## 2020-10-12 LAB — HM DIABETES EYE EXAM

## 2020-10-14 ENCOUNTER — Encounter: Payer: Self-pay | Admitting: Internal Medicine

## 2020-10-16 DIAGNOSIS — M25562 Pain in left knee: Secondary | ICD-10-CM | POA: Diagnosis not present

## 2020-10-21 ENCOUNTER — Other Ambulatory Visit: Payer: Self-pay | Admitting: Internal Medicine

## 2020-11-03 ENCOUNTER — Other Ambulatory Visit: Payer: PPO | Admitting: Internal Medicine

## 2020-11-03 ENCOUNTER — Other Ambulatory Visit: Payer: Self-pay

## 2020-11-03 DIAGNOSIS — Z9882 Breast implant status: Secondary | ICD-10-CM | POA: Diagnosis not present

## 2020-11-03 DIAGNOSIS — Z0000 Encounter for general adult medical examination without abnormal findings: Secondary | ICD-10-CM | POA: Diagnosis not present

## 2020-11-03 DIAGNOSIS — E7800 Pure hypercholesterolemia, unspecified: Secondary | ICD-10-CM | POA: Diagnosis not present

## 2020-11-03 DIAGNOSIS — G4700 Insomnia, unspecified: Secondary | ICD-10-CM | POA: Diagnosis not present

## 2020-11-03 DIAGNOSIS — M810 Age-related osteoporosis without current pathological fracture: Secondary | ICD-10-CM

## 2020-11-03 DIAGNOSIS — Z8709 Personal history of other diseases of the respiratory system: Secondary | ICD-10-CM

## 2020-11-03 DIAGNOSIS — K580 Irritable bowel syndrome with diarrhea: Secondary | ICD-10-CM

## 2020-11-04 LAB — COMPLETE METABOLIC PANEL WITH GFR
AG Ratio: 2 (calc) (ref 1.0–2.5)
ALT: 16 U/L (ref 6–29)
AST: 21 U/L (ref 10–35)
Albumin: 4.3 g/dL (ref 3.6–5.1)
Alkaline phosphatase (APISO): 52 U/L (ref 37–153)
BUN/Creatinine Ratio: 36 (calc) — ABNORMAL HIGH (ref 6–22)
BUN: 28 mg/dL — ABNORMAL HIGH (ref 7–25)
CO2: 29 mmol/L (ref 20–32)
Calcium: 9.5 mg/dL (ref 8.6–10.4)
Chloride: 104 mmol/L (ref 98–110)
Creat: 0.78 mg/dL (ref 0.60–0.93)
GFR, Est African American: 88 mL/min/{1.73_m2} (ref >=60)
GFR, Est Non African American: 76 mL/min/{1.73_m2} (ref >=60)
Globulin: 2.2 g/dL (calc) (ref 1.9–3.7)
Glucose, Bld: 77 mg/dL (ref 65–99)
Potassium: 4.9 mmol/L (ref 3.5–5.3)
Sodium: 140 mmol/L (ref 135–146)
Total Bilirubin: 0.4 mg/dL (ref 0.2–1.2)
Total Protein: 6.5 g/dL (ref 6.1–8.1)

## 2020-11-04 LAB — LIPID PANEL
Cholesterol: 218 mg/dL — ABNORMAL HIGH (ref <200)
HDL: 100 mg/dL (ref >=50)
LDL Cholesterol (Calc): 103 mg/dL (calc) — ABNORMAL HIGH
Non-HDL Cholesterol (Calc): 118 mg/dL (calc) (ref <130)
Total CHOL/HDL Ratio: 2.2 (calc) (ref <5.0)
Triglycerides: 67 mg/dL (ref <150)

## 2020-11-04 LAB — CBC WITH DIFFERENTIAL/PLATELET
Absolute Monocytes: 396 cells/uL (ref 200–950)
Basophils Absolute: 18 cells/uL (ref 0–200)
Basophils Relative: 0.4 %
Eosinophils Absolute: 92 cells/uL (ref 15–500)
Eosinophils Relative: 2 %
HCT: 41.1 % (ref 35.0–45.0)
Hemoglobin: 13.5 g/dL (ref 11.7–15.5)
Lymphs Abs: 1063 cells/uL (ref 850–3900)
MCH: 28.8 pg (ref 27.0–33.0)
MCHC: 32.8 g/dL (ref 32.0–36.0)
MCV: 87.6 fL (ref 80.0–100.0)
MPV: 10.7 fL (ref 7.5–12.5)
Monocytes Relative: 8.6 %
Neutro Abs: 3031 cells/uL (ref 1500–7800)
Neutrophils Relative %: 65.9 %
Platelets: 292 10*3/uL (ref 140–400)
RBC: 4.69 10*6/uL (ref 3.80–5.10)
RDW: 14 % (ref 11.0–15.0)
Total Lymphocyte: 23.1 %
WBC: 4.6 10*3/uL (ref 3.8–10.8)

## 2020-11-04 LAB — TSH: TSH: 1.82 mIU/L (ref 0.40–4.50)

## 2020-11-05 DIAGNOSIS — M25562 Pain in left knee: Secondary | ICD-10-CM | POA: Diagnosis not present

## 2020-11-06 ENCOUNTER — Encounter: Payer: Self-pay | Admitting: Internal Medicine

## 2020-11-06 ENCOUNTER — Ambulatory Visit (INDEPENDENT_AMBULATORY_CARE_PROVIDER_SITE_OTHER): Payer: PPO | Admitting: Internal Medicine

## 2020-11-06 ENCOUNTER — Other Ambulatory Visit: Payer: Self-pay

## 2020-11-06 VITALS — BP 130/80 | HR 73 | Ht 62.0 in | Wt 143.0 lb

## 2020-11-06 DIAGNOSIS — Z8709 Personal history of other diseases of the respiratory system: Secondary | ICD-10-CM

## 2020-11-06 DIAGNOSIS — G4700 Insomnia, unspecified: Secondary | ICD-10-CM

## 2020-11-06 DIAGNOSIS — Z9882 Breast implant status: Secondary | ICD-10-CM | POA: Diagnosis not present

## 2020-11-06 DIAGNOSIS — G8929 Other chronic pain: Secondary | ICD-10-CM

## 2020-11-06 DIAGNOSIS — K580 Irritable bowel syndrome with diarrhea: Secondary | ICD-10-CM

## 2020-11-06 DIAGNOSIS — M810 Age-related osteoporosis without current pathological fracture: Secondary | ICD-10-CM | POA: Diagnosis not present

## 2020-11-06 DIAGNOSIS — Z8659 Personal history of other mental and behavioral disorders: Secondary | ICD-10-CM | POA: Diagnosis not present

## 2020-11-06 DIAGNOSIS — M545 Low back pain, unspecified: Secondary | ICD-10-CM

## 2020-11-06 DIAGNOSIS — Z0000 Encounter for general adult medical examination without abnormal findings: Secondary | ICD-10-CM | POA: Diagnosis not present

## 2020-11-06 DIAGNOSIS — E7800 Pure hypercholesterolemia, unspecified: Secondary | ICD-10-CM | POA: Diagnosis not present

## 2020-11-06 LAB — POCT URINALYSIS DIPSTICK
Appearance: NEGATIVE
Bilirubin, UA: NEGATIVE
Blood, UA: NEGATIVE
Glucose, UA: NEGATIVE
Ketones, UA: NEGATIVE
Leukocytes, UA: NEGATIVE
Nitrite, UA: NEGATIVE
Odor: NEGATIVE
Protein, UA: NEGATIVE
Spec Grav, UA: 1.015 (ref 1.010–1.025)
Urobilinogen, UA: 0.2 E.U./dL
pH, UA: 6.5 (ref 5.0–8.0)

## 2020-11-06 NOTE — Patient Instructions (Addendum)
She will see [facility] for right popliteal superficial varicosities.Labs are stable.  Return here in 1 year or as needed.  Continue current medications as prescribed.
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
## 2020-11-07 ENCOUNTER — Encounter: Payer: Self-pay | Admitting: Internal Medicine

## 2020-11-17 DIAGNOSIS — H25812 Combined forms of age-related cataract, left eye: Secondary | ICD-10-CM | POA: Diagnosis not present

## 2020-11-17 DIAGNOSIS — H2512 Age-related nuclear cataract, left eye: Secondary | ICD-10-CM | POA: Diagnosis not present

## 2020-11-18 DIAGNOSIS — M1712 Unilateral primary osteoarthritis, left knee: Secondary | ICD-10-CM | POA: Diagnosis not present

## 2020-11-18 DIAGNOSIS — S83242A Other tear of medial meniscus, current injury, left knee, initial encounter: Secondary | ICD-10-CM | POA: Diagnosis not present

## 2020-11-24 ENCOUNTER — Ambulatory Visit: Payer: PPO | Admitting: Internal Medicine

## 2020-11-24 ENCOUNTER — Encounter: Payer: Self-pay | Admitting: Internal Medicine

## 2020-11-24 ENCOUNTER — Other Ambulatory Visit: Payer: Self-pay

## 2020-11-24 VITALS — BP 120/80 | HR 76 | Ht 62.0 in | Wt 138.0 lb

## 2020-11-24 DIAGNOSIS — M810 Age-related osteoporosis without current pathological fracture: Secondary | ICD-10-CM

## 2020-11-24 DIAGNOSIS — E559 Vitamin D deficiency, unspecified: Secondary | ICD-10-CM | POA: Diagnosis not present
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
## 2020-11-24 NOTE — Patient Instructions (Addendum)
Please stop at the lab.  Reduce calcium intake to just 1 tablet a day.  Please come back for a follow-up appointment in 1 year.

## 2020-11-25 ENCOUNTER — Ambulatory Visit: Payer: PPO | Admitting: Allergy

## 2020-11-25 ENCOUNTER — Encounter: Payer: Self-pay | Admitting: Allergy

## 2020-11-25 ENCOUNTER — Encounter: Payer: Self-pay | Admitting: Internal Medicine

## 2020-11-25 VITALS — BP 126/76 | HR 72 | Temp 97.2°F | Resp 14 | Ht 63.0 in | Wt 138.8 lb

## 2020-11-25 DIAGNOSIS — J3089 Other allergic rhinitis: Secondary | ICD-10-CM

## 2020-11-25 DIAGNOSIS — J4530 Mild persistent asthma, uncomplicated: Secondary | ICD-10-CM

## 2020-11-25 DIAGNOSIS — J069 Acute upper respiratory infection, unspecified: Secondary | ICD-10-CM

## 2020-11-25 LAB — VITAMIN D 25 HYDROXY (VIT D DEFICIENCY, FRACTURES): Vit D, 25-Hydroxy: 58.5 ng/mL (ref 30.0–100.0)

## 2020-11-25 MED ORDER — PROAIR RESPICLICK 108 (90 BASE) MCG/ACT IN AEPB: 2.0000 | INHALATION_SPRAY | RESPIRATORY_TRACT | 2 refills | Status: DC | PRN

## 2020-11-25 MED ORDER — BUDESONIDE-FORMOTEROL FUMARATE 80-4.5 MCG/ACT IN AERO: 2.0000 | INHALATION_SPRAY | Freq: Two times a day (BID) | RESPIRATORY_TRACT | 5 refills | Status: DC

## 2020-11-25 MED ORDER — AZELASTINE HCL 0.1 % NA SOLN: 1.0000 | Freq: Two times a day (BID) | NASAL | 5 refills | Status: DC | PRN

## 2020-11-25 MED ORDER — AZITHROMYCIN 250 MG PO TABS: ORAL_TABLET | ORAL | 0 refills | Status: DC

## 2020-11-25 NOTE — Progress Notes (Signed)
Many thanks. 

## 2020-11-25 NOTE — Assessment & Plan Note (Signed)
Persistent  URI symptoms with now discolored mucous for 6 days. No fevers/chills. Negative at home COVID-19 testing.  Start zpak.  If you don't feel better let us know.

## 2020-11-25 NOTE — Assessment & Plan Note (Signed)
Past history - Issues with coughing, wheezing and SOB for the past 3-4 years with worsening during URIs. Used to be on Flovent, Qvar in the past. Tried PPIs in the past with no benefit. 2020 skin testing showed: Borderline positive to maple tree pollen. 2020 spirometry showed: mild possible restrictive disease with 14% improvement in FEV post bronchodilator treatment.  Interim history - increased symptoms and albuterol use since current URI.   Today's ACT score 22.  Use Symbicort 71mcg 2 puffs twice a day until your symptoms improve.  If not better, then may need a short burst of oral prednisone.   Daily controller medication(s): continue Symbicort 76mcg 1 puff twice a day with spacer and rinse mouth afterwards.  May use albuterol rescue inhaler 2 puffs or nebulizer every 4 to 6 hours as needed for shortness of breath, chest tightness, coughing, and wheezing. May use albuterol rescue inhaler 2 puffs 5 to 15 minutes prior to strenuous physical activities. Monitor frequency of use.   During upper respiratory infections/asthma flares: Start Symbicort 67mcg 2 puffs twice a day for 1-2 weeks  Get spirometry at next visit.

## 2020-11-25 NOTE — Assessment & Plan Note (Signed)
Past history - Perennial rhinitis symptoms for the past 8 years. 2020 skin testing was borderline positive to tree pollen.  Interim history - asymptomatic with no daily medications. Some dry eyes after cataract surgery.  Continue environmental control measures.   May use azelastine nasal spray 1-2 sprays per nostril 1-2 times a day as needed for runny nose.  May use over the counter antihistamines such as Zyrtec (cetirizine), Claritin (loratadine), Allegra (fexofenadine), or Xyzal (levocetirizine) daily as needed. May take twice a day during flares.

## 2020-11-25 NOTE — Progress Notes (Signed)
Follow Up Note  RE: Mary Garcia MRN: [id number] DOB: 1946-08-07 Date of Office Visit: 11/25/2020  Referring provider: Elby Showers, MD Primary care provider: Elby Showers, MD  Chief Complaint: Asthma (Says her asthma if not going well. Says she has a upper respiratory infection. Ugly color mucus comes up. Says she is using Symbicort 2 puffs BID.)  History of Present Illness: I had the pleasure of seeing Mary Garcia for a follow up visit at the [facility] on 11/25/2020. She is a 75 y.o. female, who is being followed for asthma, allergic rhinitis. Her previous allergy office visit was on 07/06/2020 with Dr. Maudie Mercury. Today is a regular follow up visit.  Mild persistent asthma ACT score 22  Patient started to have URI symptoms last Thursday with chest tightness, coughing with greenish mucous.  No fevers/chills. Her husband has been sick 3 weeks ago.  Negative covid-19 test at home.   Currently on Symbicort 23mcg 2 puffs BID during this episode and using albuterol 4 times per day with good benefit.  No prednisone use since the last visit.   Other allergic rhinitis Patient had cataract surgery 1 week ago and having some dry eyes. No issues with her sinuses - not on any nasal sprays or antihistamines.  She also had a mini face lift since the last visit.   Assessment and Plan: Mary Garcia is a 75 y.o. female with: Upper respiratory tract infection Persistent  URI symptoms with now discolored mucous for 6 days. No fevers/chills. Negative at home COVID-19 testing.  Start zpak.  If you don't feel better let us know.   Mild persistent asthma without complication Past history - Issues with coughing, wheezing and SOB for the past 3-4 years with worsening during URIs. Used to be on Flovent, Qvar in the past. Tried PPIs in the past with no benefit. 2020 skin testing showed: Borderline positive to maple tree pollen. 2020 spirometry showed: mild possible restrictive disease with 14%  improvement in FEV post bronchodilator treatment.  Interim history - increased symptoms and albuterol use since current URI.   Today's ACT score 22.  Use Symbicort 37mcg 2 puffs twice a day until your symptoms improve.  If not better, then may need a short burst of oral prednisone.   Daily controller medication(s): continue Symbicort 24mcg 1 puff twice a day with spacer and rinse mouth afterwards.  May use albuterol rescue inhaler 2 puffs or nebulizer every 4 to 6 hours as needed for shortness of breath, chest tightness, coughing, and wheezing. May use albuterol rescue inhaler 2 puffs 5 to 15 minutes prior to strenuous physical activities. Monitor frequency of use.   During upper respiratory infections/asthma flares: Start Symbicort 55mcg 2 puffs twice a day for 1-2 weeks  Get spirometry at next visit.   Other allergic rhinitis Past history - Perennial rhinitis symptoms for the past 8 years. 2020 skin testing was borderline positive to tree pollen.  Interim history - asymptomatic with no daily medications. Some dry eyes after cataract surgery.  Continue environmental control measures.   May use azelastine nasal spray 1-2 sprays per nostril 1-2 times a day as needed for runny nose.  May use over the counter antihistamines such as Zyrtec (cetirizine), Claritin (loratadine), Allegra (fexofenadine), or Xyzal (levocetirizine) daily as needed. May take twice a day during flares.   Return in about 5 months (around 04/27/2021).  Meds ordered this encounter  Medications  . budesonide-formoterol (SYMBICORT) 80-4.5 MCG/ACT inhaler  Sig: Inhale 2 puffs into the lungs 2 (two) times daily. with spacer and rinse mouth afterwards.    Dispense:  11 g    Refill:  5  . azelastine (ASTELIN) 0.1 % nasal spray    Sig: Place 1 spray into both nostrils 2 (two) times daily as needed for rhinitis. Use in each nostril as directed    Dispense:  30 mL    Refill:  5  . azithromycin (ZITHROMAX Z-PAK) 250 MG  tablet    Sig: Take 2 tablets on day 1 and then take 1 tablet on day 2-5.    Dispense:  6 each    Refill:  0  . Albuterol Sulfate (PROAIR RESPICLICK) 026 (90 Base) MCG/ACT AEPB    Sig: Inhale 2 puffs into the lungs every 4 (four) hours as needed (coughing, wheezing, shorntess of breath).    Dispense:  1 each    Refill:  2   Lab Orders  No laboratory test(s) ordered today    Diagnostics: None.  Medication List:  Current Outpatient Medications  Medication Sig Dispense Refill  . Albuterol Sulfate (PROAIR RESPICLICK) 378 (90 Base) MCG/ACT AEPB Inhale 2 puffs into the lungs every 4 (four) hours as needed (coughing, wheezing, shorntess of breath). 1 each 2  . ALPRAZolam (XANAX) 0.25 MG tablet Take 1 tablet (0.25 mg total) by mouth 2 (two) times daily as needed for anxiety. 60 tablet 0  . azithromycin (ZITHROMAX Z-PAK) 250 MG tablet Take 2 tablets on day 1 and then take 1 tablet on day 2-5. 6 each 0  . celecoxib (CELEBREX) 200 MG capsule Take by mouth.    . dicyclomine (BENTYL) 10 MG capsule Take 1 capsule (10 mg total) by mouth 4 (four) times daily -  before meals and at bedtime. 30 capsule 1  . ELDERBERRY PO Take by mouth.    . ibandronate (BONIVA) 150 MG tablet Take 1 tablet (150 mg total) by mouth every 30 (thirty) days. Take in the morning with a full glass of water, on an empty stomach, and do not take anything else by mouth or lie down for the next 30 min. 1 tablet 11  . ketoconazole (NIZORAL) 2 % cream ketoconazole 2 % topical cream    . pregabalin (LYRICA) 50 MG capsule Take 1 capsule by mouth twice daily 180 capsule 0  . tretinoin (RETIN-A) 0.025 % cream tretinoin 0.025 % topical cream    . Vitamin D, Cholecalciferol, 1000 UNITS TABS Take 1 tablet by mouth daily.    Marland Kitchen zolpidem (AMBIEN) 10 MG tablet TAKE 1 TABLET BY MOUTH EVERY DAY AT BEDTIME AS NEEDED FOR SLEEP 90 tablet 1  . azelastine (ASTELIN) 0.1 % nasal spray Place 1 spray into both nostrils 2 (two) times daily as needed for  rhinitis. Use in each nostril as directed 30 mL 5  . budesonide-formoterol (SYMBICORT) 80-4.5 MCG/ACT inhaler Inhale 2 puffs into the lungs 2 (two) times daily. with spacer and rinse mouth afterwards. 11 g 5   No current facility-administered medications for this visit.   Allergies: No Known Allergies I reviewed her past medical history, social history, family history, and environmental history and no significant changes have been reported from her previous visit.  Review of Systems  Constitutional: Negative for appetite change, chills, fever and unexpected weight change.  HENT: Negative for congestion and rhinorrhea.   Eyes: Negative for itching.  Respiratory: Positive for cough, chest tightness, shortness of breath and wheezing.   Cardiovascular: Negative for chest pain.  Gastrointestinal: Negative for abdominal pain.  Genitourinary: Negative for difficulty urinating.  Skin: Negative for rash.  Allergic/Immunologic: Positive for environmental allergies. Negative for food allergies.  Neurological: Negative for headaches.   Objective: BP 126/76   Pulse 72   Temp (!) 97.2 F (36.2 C)   Resp 14   Ht 5\' 3"  (1.6 m)   Wt 138 lb 12.8 oz (63 kg)   SpO2 95%   BMI 24.59 kg/m  Body mass index is 24.59 kg/m. Physical Exam Vitals and nursing note reviewed.  Constitutional:      Appearance: Normal appearance. She is well-developed.  HENT:     Head: Normocephalic and atraumatic.     Right Ear: Tympanic membrane and external ear normal.     Left Ear: Tympanic membrane and external ear normal.     Nose: Congestion and rhinorrhea present.     Mouth/Throat:     Mouth: Mucous membranes are moist.     Pharynx: Oropharynx is clear.  Eyes:     Conjunctiva/sclera: Conjunctivae normal.  Cardiovascular:     Rate and Rhythm: Normal rate and regular rhythm.     Heart sounds: Normal heart sounds. No murmur heard. No friction rub. No gallop.   Pulmonary:     Effort: Pulmonary effort is  normal.     Breath sounds: Normal breath sounds. No wheezing or rales.  Musculoskeletal:     Cervical back: Neck supple.  Skin:    General: Skin is warm.     Findings: No rash.  Neurological:     Mental Status: She is alert and oriented to person, place, and time.  Psychiatric:        Mood and Affect: Mood normal.        Behavior: Behavior normal.    Previous notes and tests were reviewed. The plan was reviewed with the patient/family, and all questions/concerned were addressed.  It was my pleasure to see Relda today and participate in her care. Please feel free to contact me with any questions or concerns.  Sincerely,  Rexene Alberts, DO Allergy & Immunology  [facility] office: [place] office: [phone]

## 2020-11-25 NOTE — Patient Instructions (Addendum)
URI  Start zpak.  If you don't feel better let us know.   Mild persistent asthma  Use Symbicort 31mcg 2 puffs twice a day until your symptoms improve.   Daily controller medication(s): continue Symbicort 15mcg 1 puff twice a day with spacer and rinse mouth afterwards.  May use albuterol rescue inhaler 2 puffs or nebulizer every 4 to 6 hours as needed for shortness of breath, chest tightness, coughing, and wheezing. May use albuterol rescue inhaler 2 puffs 5 to 15 minutes prior to strenuous physical activities. Monitor frequency of use.   During upper respiratory infections/asthma flares: Start Symbicort 27mcg 2 puffs twice a day for 1-2 weeks Asthma control goals:  Full participation in all desired activities (may need albuterol before activity) Albuterol use two times or less a week on average (not counting use with activity) Cough interfering with sleep two times or less a month Oral steroids no more than once a year No hospitalizations  Other allergic rhinitis 2020 skin testing was borderline positive to tree pollen.   Continue environmental control measures.   May use azelastine nasal spray 1-2 sprays per nostril 1-2 times a day as needed for runny nose.  May use over the counter antihistamines such as Zyrtec (cetirizine), Claritin (loratadine), Allegra (fexofenadine), or Xyzal (levocetirizine) daily as needed. May take twice a day during flares.   Follow up in 5 months or sooner if needed.  Reducing Pollen Exposure . Pollen seasons: trees (spring), grass (summer) and ragweed/weeds (fall). Marland Kitchen Keep windows closed in your home and car to lower pollen exposure.  Susa Simmonds air conditioning in the bedroom and throughout the house if possible.  . Avoid going out in dry windy days - especially early morning. . Pollen counts are highest between 5 - 10 AM and on dry, hot and windy days.  . Save outside activities for late afternoon or after a heavy rain, when pollen levels are  lower.  . Avoid mowing of grass if you have grass pollen allergy. Marland Kitchen Be aware that pollen can also be transported indoors on people and pets.  . Dry your clothes in an automatic dryer rather than hanging them outside where they might collect pollen.  . Rinse hair and eyes before bedtime.   Drink plenty of fluids.  Water, juice, clear broth or warm lemon water are good choices. Avoid caffeine and alcohol, which can dehydrate you.  Eat chicken soup.  Chicken soup and other warm fluids can be soothing and loosen congestion.  Rest.  Adjust your room's temperature and humidity.  Keep your room warm but not overheated. If the air is dry, a cool-mist humidifier or vaporizer can moisten the air and help ease congestion and coughing. Keep the humidifier clean to prevent the growth of bacteria and molds.  Soothe your throat.  Perform a saltwater gargle. Dissolve one-quarter to a half teaspoon of salt in a 4- to 8-ounce glass of warm water. This can relieve a sore or scratchy throat temporarily.  Use saline nasal drops.  To help relieve nasal congestion, try saline nasal drops. You can buy these drops over the counter, and they can help relieve symptoms ? even in children.  Take over-the-counter cold and cough medications.  For adults and children older than 5, over-the-counter decongestants, antihistamines and pain relievers might offer some symptom relief. However, they won't prevent a cold or shorten its duration.

## 2020-12-02 DIAGNOSIS — H2511 Age-related nuclear cataract, right eye: Secondary | ICD-10-CM | POA: Diagnosis not present

## 2020-12-02 DIAGNOSIS — H25011 Cortical age-related cataract, right eye: Secondary | ICD-10-CM | POA: Diagnosis not present

## 2020-12-08 ENCOUNTER — Other Ambulatory Visit: Payer: Self-pay | Admitting: Internal Medicine

## 2020-12-08 DIAGNOSIS — H25811 Combined forms of age-related cataract, right eye: Secondary | ICD-10-CM | POA: Diagnosis not present

## 2020-12-08 DIAGNOSIS — H2511 Age-related nuclear cataract, right eye: Secondary | ICD-10-CM | POA: Diagnosis not present

## 2020-12-08 DIAGNOSIS — H25011 Cortical age-related cataract, right eye: Secondary | ICD-10-CM | POA: Diagnosis not present

## 2020-12-14 DIAGNOSIS — D485 Neoplasm of uncertain behavior of skin: Secondary | ICD-10-CM | POA: Diagnosis not present

## 2020-12-14 DIAGNOSIS — D2239 Melanocytic nevi of other parts of face: Secondary | ICD-10-CM | POA: Diagnosis not present

## 2020-12-14 DIAGNOSIS — D220 Melanocytic nevi of lip: Secondary | ICD-10-CM | POA: Diagnosis not present

## 2021-01-28 DIAGNOSIS — M170 Bilateral primary osteoarthritis of knee: Secondary | ICD-10-CM | POA: Diagnosis not present

## 2021-01-28 DIAGNOSIS — M1712 Unilateral primary osteoarthritis, left knee: Secondary | ICD-10-CM | POA: Diagnosis not present

## 2021-01-28 DIAGNOSIS — S83242D Other tear of medial meniscus, current injury, left knee, subsequent encounter: Secondary | ICD-10-CM | POA: Diagnosis not present

## 2021-02-18 DIAGNOSIS — B354 Tinea corporis: Secondary | ICD-10-CM | POA: Diagnosis not present

## 2021-03-11 DIAGNOSIS — M1712 Unilateral primary osteoarthritis, left knee: Secondary | ICD-10-CM | POA: Diagnosis not present

## 2021-03-11 DIAGNOSIS — S83242A Other tear of medial meniscus, current injury, left knee, initial encounter: Secondary | ICD-10-CM | POA: Diagnosis not present

## 2021-04-07 DIAGNOSIS — M94262 Chondromalacia, left knee: Secondary | ICD-10-CM | POA: Diagnosis not present

## 2021-04-07 DIAGNOSIS — M659 Synovitis and tenosynovitis, unspecified: Secondary | ICD-10-CM | POA: Diagnosis not present

## 2021-04-07 DIAGNOSIS — Y999 Unspecified external cause status: Secondary | ICD-10-CM | POA: Diagnosis not present

## 2021-04-07 DIAGNOSIS — G8918 Other acute postprocedural pain: Secondary | ICD-10-CM | POA: Diagnosis not present

## 2021-04-07 DIAGNOSIS — S83232A Complex tear of medial meniscus, current injury, left knee, initial encounter: Secondary | ICD-10-CM | POA: Diagnosis not present

## 2021-04-07 DIAGNOSIS — M6752 Plica syndrome, left knee: Secondary | ICD-10-CM | POA: Diagnosis not present

## 2021-04-07 DIAGNOSIS — X58XXXA Exposure to other specified factors, initial encounter: Secondary | ICD-10-CM | POA: Diagnosis not present

## 2021-04-13 DIAGNOSIS — Z7409 Other reduced mobility: Secondary | ICD-10-CM | POA: Diagnosis not present

## 2021-04-13 DIAGNOSIS — Z9889 Other specified postprocedural states: Secondary | ICD-10-CM | POA: Diagnosis not present

## 2021-04-21 ENCOUNTER — Other Ambulatory Visit: Payer: Self-pay | Admitting: Internal Medicine

## 2021-04-28 ENCOUNTER — Ambulatory Visit: Payer: PPO | Admitting: Allergy

## 2021-04-29 DIAGNOSIS — D1621 Benign neoplasm of long bones of right lower limb: Secondary | ICD-10-CM | POA: Diagnosis not present

## 2021-04-29 DIAGNOSIS — M1711 Unilateral primary osteoarthritis, right knee: Secondary | ICD-10-CM | POA: Diagnosis not present

## 2021-04-29 DIAGNOSIS — M25561 Pain in right knee: Secondary | ICD-10-CM | POA: Diagnosis not present

## 2021-05-13 DIAGNOSIS — M1711 Unilateral primary osteoarthritis, right knee: Secondary | ICD-10-CM | POA: Diagnosis not present

## 2021-05-13 DIAGNOSIS — M2351 Chronic instability of knee, right knee: Secondary | ICD-10-CM | POA: Diagnosis not present

## 2021-05-14 ENCOUNTER — Other Ambulatory Visit: Payer: Self-pay | Admitting: Orthopedic Surgery

## 2021-05-14 ENCOUNTER — Ambulatory Visit
Admission: RE | Admit: 2021-05-14 | Discharge: 2021-05-14 | Disposition: A | Discharge status: Home / Self Care | Payer: PPO | Source: Ambulatory Visit | Attending: Orthopedic Surgery | Admitting: Orthopedic Surgery

## 2021-05-14 DIAGNOSIS — M25561 Pain in right knee: Secondary | ICD-10-CM

## 2021-05-16 NOTE — Progress Notes (Signed)
Follow Up Note  RE: [name] MRN: [id number] DOB: 01/26/1946 Date of Office Visit: 05/17/2021  Referring provider: Elby Showers, MD Primary care provider: Elby Showers, MD  Chief Complaint: Follow-up  History of Present Illness: I had the pleasure of seeing Mary Garcia for a follow up visit at the [facility] on 05/17/2021. She is a 75 y.o. female, who is being followed for asthma, allergic rhinitis. Her previous allergy office visit was on 11/25/2020 with Dr. Maudie Mercury. Today is a regular follow up visit.   Mild persistent asthma  Stopped Symbicort in June and no rescue inhaler use with no worsening symptoms. However symptoms usually flare in the fall and winter through the spring. She just took her Symbicort this morning.   Denies any SOB, coughing, wheezing, chest tightness, nocturnal awakenings, ER/urgent care visits or prednisone use since the last visit.  No prior Covid-19 infection and up to date with Covid-19 vaccines including boosters.  Allergic rhinitis Not taking any medications and doing well.  Some PND in the mornings only.  Assessment and Plan: Takaya is a 75 y.o. female with: Mild persistent asthma without complication Past history - Issues with coughing, wheezing and SOB for the past 3-4 years with worsening during URIs. Used to be on Flovent, Qvar in the past. Tried PPIs in the past with no benefit. 2020 skin testing showed: Borderline positive to maple tree pollen. 2020 spirometry showed: mild possible restrictive disease with 14% improvement in FEV post bronchodilator treatment.  Interim history - stopped Symbicort in June with no issues. Usually flares from October through May.  Today's spirometry showed some restriction - slightly worse than previous one. Daily controller medication(s): restart Symbicort 44mcg 2 puffs once a day with spacer and rinse mouth afterwards. From October through May.  May use albuterol rescue inhaler 2 puffs or nebulizer  every 4 to 6 hours as needed for shortness of breath, chest tightness, coughing, and wheezing. May use albuterol rescue inhaler 2 puffs 5 to 15 minutes prior to strenuous physical activities. Monitor frequency of use.  During upper respiratory infections/asthma flares: Start Symbicort 64mcg 2 puffs twice a day for 1-2 weeks Get spirometry at next visit.  Seasonal allergic rhinitis due to pollen Past history - Perennial rhinitis symptoms for the past 8 years. 2020 skin testing was borderline positive to tree pollen.  Interim history - Some PND in the morning.  Continue environmental control measures.  May use azelastine nasal spray 1-2 sprays per nostril 1-2 times a day as needed for runny nose. May use over the counter antihistamines such as Zyrtec (cetirizine), Claritin (loratadine), Allegra (fexofenadine), or Xyzal (levocetirizine) daily as needed. May take twice a day during flares.   Return in about 6 months (around 11/15/2021).  No orders of the defined types were placed in this encounter.  Lab Orders  No laboratory test(s) ordered today    Diagnostics: Spirometry:  Tracings reviewed. Her effort: Good reproducible efforts. FVC: 1.96L FEV1: 1.40L, 70% predicted FEV1/FVC ratio: 71% Interpretation: Spirometry consistent with possible restrictive disease.  Please see scanned spirometry results for details.  Medication List:  Current Outpatient Medications  Medication Sig Dispense Refill   Albuterol Sulfate (PROAIR RESPICLICK) 017 (90 Base) MCG/ACT AEPB Inhale 2 puffs into the lungs every 4 (four) hours as needed (coughing, wheezing, shorntess of breath). 1 each 2   ALPRAZolam (XANAX) 0.25 MG tablet Take 1 tablet (0.25 mg total) by mouth 2 (two) times daily as needed for  anxiety. 60 tablet 0   azelastine (ASTELIN) 0.1 % nasal spray Place 1 spray into both nostrils 2 (two) times daily as needed for rhinitis. Use in each nostril as directed 30 mL 5   budesonide-formoterol (SYMBICORT)  80-4.5 MCG/ACT inhaler Inhale 2 puffs into the lungs 2 (two) times daily. with spacer and rinse mouth afterwards. 11 g 5   celecoxib (CELEBREX) 200 MG capsule Take by mouth.     dicyclomine (BENTYL) 10 MG capsule Take 1 capsule (10 mg total) by mouth 4 (four) times daily -  before meals and at bedtime. 30 capsule 1   ELDERBERRY PO Take by mouth.     ibandronate (BONIVA) 150 MG tablet Take 1 tablet (150 mg total) by mouth every 30 (thirty) days. Take in the morning with a full glass of water, on an empty stomach, and do not take anything else by mouth or lie down for the next 30 min. 1 tablet 11   ketoconazole (NIZORAL) 2 % cream ketoconazole 2 % topical cream     pregabalin (LYRICA) 50 MG capsule Take 1 capsule by mouth twice daily 180 capsule 1   tretinoin (RETIN-A) 0.025 % cream tretinoin 0.025 % topical cream     Vitamin D, Cholecalciferol, 1000 UNITS TABS Take 1 tablet by mouth daily.     zolpidem (AMBIEN) 10 MG tablet TAKE 1 TABLET BY MOUTH EVERY DAY AT BEDTIME AS NEEDED FOR SLEEP 90 tablet 0   No current facility-administered medications for this visit.   Allergies: No Known Allergies I reviewed her past medical history, social history, family history, and environmental history and no significant changes have been reported from her previous visit.  Review of Systems  Constitutional:  Negative for appetite change, chills, fever and unexpected weight change.  HENT:  Positive for postnasal drip. Negative for congestion and rhinorrhea.   Eyes:  Negative for itching.  Respiratory:  Negative for cough, chest tightness, shortness of breath and wheezing.   Cardiovascular:  Negative for chest pain.  Gastrointestinal:  Negative for abdominal pain.  Genitourinary:  Negative for difficulty urinating.  Skin:  Negative for rash.  Allergic/Immunologic: Positive for environmental allergies. Negative for food allergies.  Neurological:  Negative for headaches.   Objective: BP 124/80   Pulse 70    Temp 98.2 F (36.8 C) (Temporal)   Resp 18   Ht 5\' 2"  (1.575 m)   Wt 139 lb 4 oz (63.2 kg)   SpO2 96%   BMI 25.47 kg/m  Body mass index is 25.47 kg/m. Physical Exam Vitals and nursing note reviewed.  Constitutional:      Appearance: Normal appearance. She is well-developed.  HENT:     Head: Normocephalic and atraumatic.     Right Ear: Tympanic membrane and external ear normal.     Left Ear: Tympanic membrane and external ear normal.     Nose: Nose normal.     Mouth/Throat:     Mouth: Mucous membranes are moist.     Pharynx: Oropharynx is clear.  Eyes:     Conjunctiva/sclera: Conjunctivae normal.  Cardiovascular:     Rate and Rhythm: Normal rate and regular rhythm.     Heart sounds: Normal heart sounds. No murmur heard.   No friction rub. No gallop.  Pulmonary:     Effort: Pulmonary effort is normal.     Breath sounds: Normal breath sounds. No wheezing or rales.  Musculoskeletal:     Cervical back: Neck supple.  Skin:    General: Skin  is warm.     Findings: No rash.  Neurological:     Mental Status: She is alert and oriented to person, place, and time.  Psychiatric:        Mood and Affect: Mood normal.        Behavior: Behavior normal.   Previous notes and tests were reviewed. The plan was reviewed with the patient/family, and all questions/concerned were addressed.  It was my pleasure to see Mary Garcia today and participate in her care. Please feel free to contact me with any questions or concerns.  Sincerely,  Rexene Alberts, DO Allergy & Immunology  [facility] office: [place] office: [phone]

## 2021-05-17 ENCOUNTER — Encounter: Payer: Self-pay | Admitting: Allergy

## 2021-05-17 ENCOUNTER — Other Ambulatory Visit: Payer: Self-pay

## 2021-05-17 ENCOUNTER — Ambulatory Visit: Payer: PPO | Admitting: Allergy

## 2021-05-17 VITALS — BP 124/80 | HR 70 | Temp 98.2°F | Resp 18 | Ht 62.0 in | Wt 139.2 lb

## 2021-05-17 DIAGNOSIS — J4530 Mild persistent asthma, uncomplicated: Secondary | ICD-10-CM

## 2021-05-17 DIAGNOSIS — J301 Allergic rhinitis due to pollen: Secondary | ICD-10-CM | POA: Diagnosis not present

## 2021-05-17 DIAGNOSIS — J3089 Other allergic rhinitis: Secondary | ICD-10-CM

## 2021-05-17 NOTE — Assessment & Plan Note (Signed)
Past history - Perennial rhinitis symptoms for the past 8 years. 2020 skin testing was borderline positive to tree pollen.  Interim history - Some PND in the morning.   Continue environmental control measures.   May use azelastine nasal spray 1-2 sprays per nostril 1-2 times a day as needed for runny nose.  May use over the counter antihistamines such as Zyrtec (cetirizine), Claritin (loratadine), Allegra (fexofenadine), or Xyzal (levocetirizine) daily as needed. May take twice a day during flares.

## 2021-05-17 NOTE — Assessment & Plan Note (Addendum)
Past history - Issues with coughing, wheezing and SOB for the past 3-4 years with worsening during URIs. Used to be on Flovent, Qvar in the past. Tried PPIs in the past with no benefit. 2020 skin testing showed: Borderline positive to maple tree pollen. 2020 spirometry showed: mild possible restrictive disease with 14% improvement in FEV post bronchodilator treatment.  Interim history - stopped Symbicort in June with no issues. Usually flares from October through May.   Today's spirometry showed some restriction - slightly worse than previous one.  Daily controller medication(s): restart Symbicort 101mcg 2 puffs once a day with spacer and rinse mouth afterwards.  From October through May.   May use albuterol rescue inhaler 2 puffs or nebulizer every 4 to 6 hours as needed for shortness of breath, chest tightness, coughing, and wheezing. May use albuterol rescue inhaler 2 puffs 5 to 15 minutes prior to strenuous physical activities. Monitor frequency of use.   During upper respiratory infections/asthma flares: Start Symbicort 63mcg 2 puffs twice a day for 1-2 weeks  Get spirometry at next visit.

## 2021-05-17 NOTE — Patient Instructions (Addendum)
Mild persistent asthma Daily controller medication(s): restart Symbicort 75mcg 2 puffs once a day with spacer and rinse mouth afterwards. From October through May.  May use albuterol rescue inhaler 2 puffs or nebulizer every 4 to 6 hours as needed for shortness of breath, chest tightness, coughing, and wheezing. May use albuterol rescue inhaler 2 puffs 5 to 15 minutes prior to strenuous physical activities. Monitor frequency of use.  During upper respiratory infections/asthma flares: Start Symbicort 20mcg 2 puffs twice a day for 1-2 weeks Asthma control goals:  Full participation in all desired activities (may need albuterol before activity) Albuterol use two times or less a week on average (not counting use with activity) Cough interfering with sleep two times or less a month Oral steroids no more than once a year No hospitalizations   Allergic rhinitis 2020 skin testing was borderline positive to tree pollen.  Continue environmental control measures.  May use azelastine nasal spray 1-2 sprays per nostril 1-2 times a day as needed for runny nose. May use over the counter antihistamines such as Zyrtec (cetirizine), Claritin (loratadine), Allegra (fexofenadine), or Xyzal (levocetirizine) daily as needed. May take twice a day during flares.   Follow up in 6 months or sooner if needed.  Reducing Pollen Exposure Pollen seasons: trees (spring), grass (summer) and ragweed/weeds (fall). Keep windows closed in your home and car to lower pollen exposure.  Install air conditioning in the bedroom and throughout the house if possible.  Avoid going out in dry windy days - especially early morning. Pollen counts are highest between 5 - 10 AM and on dry, hot and windy days.  Save outside activities for late afternoon or after a heavy rain, when pollen levels are lower.  Avoid mowing of grass if you have grass pollen allergy. Be aware that pollen can also be transported indoors on people and pets.  Dry  your clothes in an automatic dryer rather than hanging them outside where they might collect pollen.  Rinse hair and eyes before bedtime.  Drink plenty of fluids. Water, juice, clear broth or warm lemon water are good choices. Avoid caffeine and alcohol, which can dehydrate you. Eat chicken soup. Chicken soup and other warm fluids can be soothing and loosen congestion. Rest. Adjust your room's temperature and humidity. Keep your room warm but not overheated. If the air is dry, a cool-mist humidifier or vaporizer can moisten the air and help ease congestion and coughing. Keep the humidifier clean to prevent the growth of bacteria and molds. Soothe your throat. Perform a saltwater gargle. Dissolve one-quarter to a half teaspoon of salt in a 4- to 8-ounce glass of warm water. This can relieve a sore or scratchy throat temporarily. Use saline nasal drops. To help relieve nasal congestion, try saline nasal drops. You can buy these drops over the counter, and they can help relieve symptoms ? even in children. Take over-the-counter cold and cough medications. For adults and children older than 5, over-the-counter decongestants, antihistamines and pain relievers might offer some symptom relief. However, they won't prevent a cold or shorten its duration.

## 2021-05-18 DIAGNOSIS — M1711 Unilateral primary osteoarthritis, right knee: Secondary | ICD-10-CM | POA: Diagnosis not present

## 2021-05-18 DIAGNOSIS — S83241A Other tear of medial meniscus, current injury, right knee, initial encounter: Secondary | ICD-10-CM | POA: Diagnosis not present

## 2021-05-18 DIAGNOSIS — M8448XA Pathological fracture, other site, initial encounter for fracture: Secondary | ICD-10-CM | POA: Diagnosis not present

## 2021-06-02 ENCOUNTER — Ambulatory Visit (INDEPENDENT_AMBULATORY_CARE_PROVIDER_SITE_OTHER): Payer: PPO | Admitting: Internal Medicine

## 2021-06-02 ENCOUNTER — Other Ambulatory Visit: Payer: Self-pay

## 2021-06-02 DIAGNOSIS — Z23 Encounter for immunization: Secondary | ICD-10-CM

## 2021-06-02 NOTE — Progress Notes (Signed)
Flu shot given in left delt.

## 2021-06-07 ENCOUNTER — Other Ambulatory Visit: Payer: Self-pay | Admitting: Internal Medicine

## 2021-06-08 ENCOUNTER — Telehealth: Payer: Self-pay | Admitting: Internal Medicine

## 2021-06-08 NOTE — Telephone Encounter (Signed)
Mary Garcia [phone]  Mary Garcia called wanting to get a prescription for Celebrex for her knees, she is tired of taking ibuprofen. She stated she had been seeing Dr Ronnie Derby had done arthroscopy on her knees, so I told her to reach out to him. That you would need a office visit, but since she was already seeing Dr Ronnie Derby, you would want her to follow up with him.

## 2021-06-18 ENCOUNTER — Encounter: Payer: Self-pay | Admitting: Internal Medicine

## 2021-06-18 ENCOUNTER — Telehealth (INDEPENDENT_AMBULATORY_CARE_PROVIDER_SITE_OTHER): Payer: PPO | Admitting: Internal Medicine

## 2021-06-18 ENCOUNTER — Other Ambulatory Visit: Payer: Self-pay

## 2021-06-18 ENCOUNTER — Telehealth: Payer: Self-pay | Admitting: Internal Medicine

## 2021-06-18 VITALS — BP 138/91 | Temp 98.7°F

## 2021-06-18 DIAGNOSIS — Z8709 Personal history of other diseases of the respiratory system: Secondary | ICD-10-CM

## 2021-06-18 DIAGNOSIS — J22 Unspecified acute lower respiratory infection: Secondary | ICD-10-CM

## 2021-06-18 MED ORDER — HYDROCODONE BIT-HOMATROP MBR 5-1.5 MG/5ML PO SOLN: 5.0000 mL | Freq: Three times a day (TID) | ORAL | 0 refills | Status: DC | PRN

## 2021-06-18 MED ORDER — PREDNISONE 10 MG PO TABS: ORAL_TABLET | ORAL | 0 refills | Status: DC

## 2021-06-18 MED ORDER — AZITHROMYCIN 250 MG PO TABS: ORAL_TABLET | ORAL | 0 refills | Status: AC

## 2021-06-18 NOTE — Progress Notes (Signed)
   Subjective:    Patient ID: Mary Garcia, female    DOB: Mar 28, 1946, 75 y.o.   MRN: [id number]  HPI 75 year old Female planning to go out of town for her upcoming birthday on November 8.  She called complaining of sore throat, cough, runny nose discolored sputum and no energy.  Has been using inhalers.  She did a COVID test last evening that was negative.  Symptoms started on Monday, October 31.  No documented fever or shaking chills.  Patient has a history of mild persistent asthma and is followed by [facility], Dr. Maudie Mercury.  She has Symbicort and ProAir inhalers.  She has osteoporosis treated with Boniva.  She has anxiety and insomnia.  She has history of asthmatic bronchitis.  Due to the Coronavirus pandemic, she is seen via interactive audio and video telecommunications today.  She is identified using 2 identifiers as Mary Garcia. [place], a patient in this practice.  She is at her home and I am at my office.  She is agreeable to visit in this format today.  Review of Systems see above-denies extreme shortness of breath or wheezing     Objective:   Physical Exam She is seen virtually in no acute distress.  Does not appear to be tachypneic but sounds nasally congested. She reports her blood pressure to be 138/91 and temperature 98.7 degrees orally      Assessment & Plan:  Acute lower respiratory infection  History of asthma and allergic rhinitis  Osteoporosis  COVID test at home is negative.  Our records indicate only 3 COVID vaccines the last 1 being in August 2021.  She needs booster this Fall.  For now, she will be treated  with Hycodan 1 teaspoon every 8 hours as needed for cough and prednisone 10 mg tablets (#21) starting with 10 mg day 1 and decreasing by 10 mg daily I.e. 6-5-4-3-2-1 taper .She was also prescribed a Zithromax Z-PAK.  She will call if not improving in next couple of days.  Time spent with this visit is 20 minutes.

## 2021-06-18 NOTE — Telephone Encounter (Addendum)
Mary Garcia [phone]  [name] called to she started getting sick on Monday with sore throat, now she has cough with congestion, runny nose, mild sore throat, feels like it is in her chest, has greenish mucus, no energy. She has been using her inhalers. Did COVID test last night that was negative. She is supposed to be going out of town soon would like antibiotic, so she would like video visit or to come in whichever you want to do.

## 2021-06-18 NOTE — Patient Instructions (Signed)
Take Prednisone in tapering course as directed 6-5-4-3-2-1. Take Zithromax Z-pak 2 tabs day 1 follow ed by one tab days 2-5. Take Hycodan sparingly for for couch.

## 2021-06-23 ENCOUNTER — Ambulatory Visit: Payer: PPO | Admitting: Allergy

## 2021-06-28 ENCOUNTER — Other Ambulatory Visit: Payer: Self-pay

## 2021-06-28 MED ORDER — IBANDRONATE SODIUM 150 MG PO TABS: 150.0000 mg | ORAL_TABLET | ORAL | 3 refills | Status: DC

## 2021-06-28 NOTE — Progress Notes (Signed)
Refill Boniva for one year

## 2021-07-12 ENCOUNTER — Other Ambulatory Visit: Payer: Self-pay | Admitting: Internal Medicine

## 2021-07-13 DIAGNOSIS — B354 Tinea corporis: Secondary | ICD-10-CM | POA: Diagnosis not present

## 2021-07-13 DIAGNOSIS — D2230 Melanocytic nevi of unspecified part of face: Secondary | ICD-10-CM | POA: Diagnosis not present

## 2021-07-13 DIAGNOSIS — L821 Other seborrheic keratosis: Secondary | ICD-10-CM | POA: Diagnosis not present

## 2021-07-13 DIAGNOSIS — B351 Tinea unguium: Secondary | ICD-10-CM | POA: Diagnosis not present

## 2021-07-13 DIAGNOSIS — L570 Actinic keratosis: Secondary | ICD-10-CM | POA: Diagnosis not present

## 2021-07-13 DIAGNOSIS — D225 Melanocytic nevi of trunk: Secondary | ICD-10-CM | POA: Diagnosis not present

## 2021-07-13 DIAGNOSIS — D2361 Other benign neoplasm of skin of right upper limb, including shoulder: Secondary | ICD-10-CM | POA: Diagnosis not present

## 2021-07-13 DIAGNOSIS — D2272 Melanocytic nevi of left lower limb, including hip: Secondary | ICD-10-CM | POA: Diagnosis not present

## 2021-07-13 DIAGNOSIS — L578 Other skin changes due to chronic exposure to nonionizing radiation: Secondary | ICD-10-CM | POA: Diagnosis not present

## 2021-07-13 DIAGNOSIS — Z23 Encounter for immunization: Secondary | ICD-10-CM | POA: Diagnosis not present

## 2021-08-19 ENCOUNTER — Telehealth: Payer: Self-pay | Admitting: Internal Medicine

## 2021-08-19 DIAGNOSIS — T8484XA Pain due to internal orthopedic prosthetic devices, implants and grafts, initial encounter: Secondary | ICD-10-CM | POA: Diagnosis not present

## 2021-08-19 DIAGNOSIS — Z9889 Other specified postprocedural states: Secondary | ICD-10-CM | POA: Diagnosis not present

## 2021-08-19 DIAGNOSIS — M1712 Unilateral primary osteoarthritis, left knee: Secondary | ICD-10-CM | POA: Diagnosis not present

## 2021-08-19 NOTE — Telephone Encounter (Signed)
Mary Garcia [phone]  [name] dropped off a surgery clearance form that needs to be filled out for her to have knee surgery. I let her know she would need an appointment because we have not seen her and done labs since 10/2020.

## 2021-08-23 NOTE — Telephone Encounter (Signed)
Scheduled

## 2021-08-24 ENCOUNTER — Other Ambulatory Visit: Payer: Self-pay

## 2021-08-24 ENCOUNTER — Other Ambulatory Visit: Payer: Medicare Other | Admitting: Internal Medicine

## 2021-08-24 DIAGNOSIS — Z01818 Encounter for other preprocedural examination: Secondary | ICD-10-CM | POA: Diagnosis not present

## 2021-08-24 DIAGNOSIS — Z01810 Encounter for preprocedural cardiovascular examination: Secondary | ICD-10-CM | POA: Diagnosis not present

## 2021-08-24 DIAGNOSIS — Z1329 Encounter for screening for other suspected endocrine disorder: Secondary | ICD-10-CM | POA: Diagnosis not present

## 2021-08-24 NOTE — Addendum Note (Signed)
Addended by: Angus Seller on: 08/24/2021 09:15 AM   Modules accepted: Orders

## 2021-08-25 LAB — CBC WITH DIFFERENTIAL/PLATELET
Absolute Monocytes: 292 cells/uL (ref 200–950)
Basophils Absolute: 11 cells/uL (ref 0–200)
Basophils Relative: 0.3 %
Eosinophils Absolute: 90 cells/uL (ref 15–500)
Eosinophils Relative: 2.5 %
HCT: 44.2 % (ref 35.0–45.0)
Hemoglobin: 14.5 g/dL (ref 11.7–15.5)
Lymphs Abs: 1292 cells/uL (ref 850–3900)
MCH: 29.1 pg (ref 27.0–33.0)
MCHC: 32.8 g/dL (ref 32.0–36.0)
MCV: 88.8 fL (ref 80.0–100.0)
MPV: 10.7 fL (ref 7.5–12.5)
Monocytes Relative: 8.1 %
Neutro Abs: 1915 cells/uL (ref 1500–7800)
Neutrophils Relative %: 53.2 %
Platelets: 293 10*3/uL (ref 140–400)
RBC: 4.98 10*6/uL (ref 3.80–5.10)
RDW: 13.6 % (ref 11.0–15.0)
Total Lymphocyte: 35.9 %
WBC: 3.6 10*3/uL — ABNORMAL LOW (ref 3.8–10.8)

## 2021-08-25 LAB — COMPLETE METABOLIC PANEL WITH GFR
AG Ratio: 1.9 (calc) (ref 1.0–2.5)
ALT: 14 U/L (ref 6–29)
AST: 21 U/L (ref 10–35)
Albumin: 4.4 g/dL (ref 3.6–5.1)
Alkaline phosphatase (APISO): 55 U/L (ref 37–153)
BUN: 19 mg/dL (ref 7–25)
CO2: 30 mmol/L (ref 20–32)
Calcium: 9.9 mg/dL (ref 8.6–10.4)
Chloride: 106 mmol/L (ref 98–110)
Creat: 0.69 mg/dL (ref 0.60–1.00)
Globulin: 2.3 g/dL (calc) (ref 1.9–3.7)
Glucose, Bld: 89 mg/dL (ref 65–99)
Potassium: 5.3 mmol/L (ref 3.5–5.3)
Sodium: 143 mmol/L (ref 135–146)
Total Bilirubin: 0.4 mg/dL (ref 0.2–1.2)
Total Protein: 6.7 g/dL (ref 6.1–8.1)
eGFR: 92 mL/min/{1.73_m2} (ref >=60)

## 2021-08-25 LAB — HEMOGLOBIN A1C
Hgb A1c MFr Bld: 5.5 % of total Hgb (ref <5.7)
Mean Plasma Glucose: 111 mg/dL
eAG (mmol/L): 6.2 mmol/L

## 2021-08-25 LAB — LIPID PANEL
Cholesterol: 267 mg/dL — ABNORMAL HIGH (ref <200)
HDL: 102 mg/dL (ref >=50)
LDL Cholesterol (Calc): 144 mg/dL (calc) — ABNORMAL HIGH
Non-HDL Cholesterol (Calc): 165 mg/dL (calc) — ABNORMAL HIGH (ref <130)
Total CHOL/HDL Ratio: 2.6 (calc) (ref <5.0)
Triglycerides: 99 mg/dL (ref <150)

## 2021-08-25 LAB — APTT: aPTT: 27 s (ref 23–32)

## 2021-08-25 LAB — PROTIME-INR
INR: 0.9
Prothrombin Time: 9.7 s (ref 9.0–11.5)

## 2021-08-25 LAB — TSH: TSH: 3.29 mIU/L (ref 0.40–4.50)

## 2021-08-30 ENCOUNTER — Other Ambulatory Visit: Payer: Self-pay

## 2021-08-30 ENCOUNTER — Ambulatory Visit (INDEPENDENT_AMBULATORY_CARE_PROVIDER_SITE_OTHER): Payer: Medicare Other | Admitting: Internal Medicine

## 2021-08-30 ENCOUNTER — Encounter (HOSPITAL_COMMUNITY): Payer: Self-pay

## 2021-08-30 ENCOUNTER — Ambulatory Visit (HOSPITAL_COMMUNITY): Payer: Medicare Other | Attending: Internal Medicine

## 2021-08-30 ENCOUNTER — Encounter: Payer: Self-pay | Admitting: Internal Medicine

## 2021-08-30 VITALS — BP 116/80 | HR 74 | Temp 97.8°F | Ht 62.0 in | Wt 142.0 lb

## 2021-08-30 DIAGNOSIS — Z01818 Encounter for other preprocedural examination: Secondary | ICD-10-CM | POA: Diagnosis not present

## 2021-08-30 DIAGNOSIS — Z01810 Encounter for preprocedural cardiovascular examination: Secondary | ICD-10-CM | POA: Diagnosis not present

## 2021-08-30 NOTE — Patient Instructions (Addendum)
It was a pleasure to see you today.  EKG reviewed and is within normal limits.  Labs are stable.  Form completed for approval for left knee arthroplasty by Dr. Ronnie Derby.
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
## 2021-08-31 NOTE — Telephone Encounter (Signed)
Fax surgery clearance paperwork to Sports Medicine [phone] and phone [phone].  Surgery clearance form, ekg, labs and office note

## 2021-09-01 ENCOUNTER — Encounter: Payer: Self-pay | Admitting: Internal Medicine

## 2021-09-01 DIAGNOSIS — Z1231 Encounter for screening mammogram for malignant neoplasm of breast: Secondary | ICD-10-CM | POA: Diagnosis not present

## 2021-09-02 ENCOUNTER — Other Ambulatory Visit: Payer: Self-pay

## 2021-09-02 DIAGNOSIS — R928 Other abnormal and inconclusive findings on diagnostic imaging of breast: Secondary | ICD-10-CM

## 2021-09-06 ENCOUNTER — Encounter: Payer: Self-pay | Admitting: Internal Medicine

## 2021-09-06 DIAGNOSIS — R922 Inconclusive mammogram: Secondary | ICD-10-CM | POA: Diagnosis not present

## 2021-09-06 DIAGNOSIS — N6311 Unspecified lump in the right breast, upper outer quadrant: Secondary | ICD-10-CM | POA: Diagnosis not present

## 2021-09-06 DIAGNOSIS — R928 Other abnormal and inconclusive findings on diagnostic imaging of breast: Secondary | ICD-10-CM | POA: Diagnosis not present

## 2021-09-13 ENCOUNTER — Encounter: Payer: Self-pay | Admitting: Internal Medicine

## 2021-09-13 ENCOUNTER — Encounter: Payer: Self-pay | Admitting: Allergy

## 2021-09-13 ENCOUNTER — Other Ambulatory Visit: Payer: Self-pay

## 2021-09-13 ENCOUNTER — Ambulatory Visit: Payer: Medicare Other | Admitting: Family

## 2021-09-13 VITALS — BP 114/80 | HR 99 | Temp 98.2°F | Resp 16 | Ht 61.89 in | Wt 140.0 lb

## 2021-09-13 DIAGNOSIS — J301 Allergic rhinitis due to pollen: Secondary | ICD-10-CM

## 2021-09-13 DIAGNOSIS — J019 Acute sinusitis, unspecified: Secondary | ICD-10-CM

## 2021-09-13 DIAGNOSIS — J4531 Mild persistent asthma with (acute) exacerbation: Secondary | ICD-10-CM | POA: Diagnosis not present

## 2021-09-13 MED ORDER — PROAIR RESPICLICK 108 (90 BASE) MCG/ACT IN AEPB: 2.0000 | INHALATION_SPRAY | RESPIRATORY_TRACT | 1 refills | Status: DC | PRN

## 2021-09-13 MED ORDER — AZELASTINE HCL 0.1 % NA SOLN: NASAL | 5 refills | Status: AC

## 2021-09-13 MED ORDER — BUDESONIDE-FORMOTEROL FUMARATE 80-4.5 MCG/ACT IN AERO: 2.0000 | INHALATION_SPRAY | Freq: Two times a day (BID) | RESPIRATORY_TRACT | 5 refills | Status: DC

## 2021-09-13 MED ORDER — BENZONATATE 100 MG PO CAPS: 100.0000 mg | ORAL_CAPSULE | Freq: Three times a day (TID) | ORAL | 0 refills | Status: DC | PRN

## 2021-09-13 MED ORDER — AEROCHAMBER PLUS FLO-VU LARGE MISC: 1.0000 | Freq: Once | 0 refills | Status: AC

## 2021-09-13 MED ORDER — AMOXICILLIN-POT CLAVULANATE 875-125 MG PO TABS: 1.0000 | ORAL_TABLET | Freq: Two times a day (BID) | ORAL | 0 refills | Status: DC

## 2021-09-13 NOTE — Addendum Note (Signed)
Addended by: Eloy End D on: 09/13/2021 04:08 PM   Modules accepted: Orders

## 2021-09-13 NOTE — Progress Notes (Signed)
[place] [address] Dept: [phone]  FOLLOW UP NOTE  Patient ID: Mary Garcia, female    DOB: [birth year]  Age: 76 y.o. MRN: [id number] Date of Office Visit: 09/13/2021  Assessment  Chief Complaint: Cough (Productive green/brown cough. Lasting about 2 weeks.), Nasal Congestion (Blood when she blows her nose only in the morning. About 2 weeks now.), Asthma (Using rescue inhaler 3 to 4 times a day.), and Allergic Rhinitis  (Flare up badly when she is sick.)  HPI Mary Garcia is a 76 year old female who presents today for an acute visit.  She was last seen on May 17, 2021 by Dr. Maudie Mercury for mild persistent asthma without complication and seasonal allergic rhinitis due to pollen.  Since her last office visit she denies any new diagnosis or surgeries but does report that she is having a left total knee replacement on October 18, 2021.  Mild persistent asthma is reported as not well controlled with Symbicort 80/4.5 mcg 2 puffs twice a day and albuterol as needed.  She does not use a spacer when she uses her Symbicort or albuterol.  She does not have a spacer.  She reports for the past 2 weeks she has had a productive cough at times and other times dry.  What she is coughing up in the morning is brown/green in color and what she is coughing up in the afternoon is green.  She also reports shortness of breath and a lot of tightness in her chest.  She denies wheezing and nocturnal awakenings.  She denies fevers.  She also denies nocturnal awakenings due to taking something to help her sleep at night.  She is using her albuterol inhaler approximately 4 times a day for the past week with some relief of symptoms.  When her symptoms started flaring she did increase her Symbicort 80/4.5 mcg to 2 puffs twice a day.  She mentions that her asthma symptoms are worse in the fall and in the winter.  She did have a cold in the fall and it was over with 5 days.  Since her last office visit she has not  received any systemic steroids and has not made any trips to the emergency room or urgent care due to breathing problems.  A week ago Sunday she did check for COVID-19 and reports that this was negative.  Seasonal allergic rhinitis is reported as not well controlled with azelastine nasal spray.  She has not recently done saline rinses.  She reports for approximately the past 2 weeks she has had rhinorrhea that is dark green and bloody in the morning.  She also reports postnasal drip and nasal congestion.  She does feel that when she gets everything out of her sinus region that it takes a load of her head.   Drug Allergies:  No Known Allergies  Review of Systems: Review of Systems  Constitutional:  Positive for chills. Negative for fever.  HENT:         Reports dark green/bloody rhinorrhea in the morning.  She also reports postnasal drip and nasal congestion.  Eyes:        Denies itchy watery eyes  Respiratory:  Positive for cough and shortness of breath. Negative for wheezing.        Reports productive cough in the morning that is brownish-green in the morning and green in the afternoon.  She also reports a lot of tightness in her chest.  She denies wheezing.  She also denies  nocturnal awakenings because she takes something to help her sleep.  Cardiovascular:  Negative for chest pain and palpitations.  Gastrointestinal:  Positive for heartburn.       Reports a little bit of heartburn  Genitourinary:  Negative for frequency.  Skin:  Negative for itching and rash.  Neurological:  Negative for headaches.  Endo/Heme/Allergies:  Positive for environmental allergies.    Physical Exam: BP 114/80    Pulse 99    Temp 98.2 F (36.8 C) (Temporal)    Resp 16    Ht 5' 1.89" (1.572 m)    Wt 140 lb (63.5 kg)    SpO2 96%    BMI 25.70 kg/m    Physical Exam Constitutional:      Appearance: Normal appearance.  HENT:     Head: Normocephalic and atraumatic.     Comments: Pharynx normal, eyes normal,  ears normal, nose: Bilateral lower turbinates moderately edematous and slightly erythematous with clear drainage noted    Right Ear: Tympanic membrane, ear canal and external ear normal.     Left Ear: Tympanic membrane, ear canal and external ear normal.     Mouth/Throat:     Mouth: Mucous membranes are moist.     Pharynx: Oropharynx is clear.  Eyes:     Conjunctiva/sclera: Conjunctivae normal.  Cardiovascular:     Rate and Rhythm: Regular rhythm.     Heart sounds: Normal heart sounds.  Pulmonary:     Effort: Pulmonary effort is normal.     Breath sounds: Normal breath sounds.     Comments: Lungs clear to auscultation Musculoskeletal:     Cervical back: Neck supple.  Skin:    General: Skin is warm.  Neurological:     Mental Status: She is alert and oriented to person, place, and time.  Psychiatric:        Mood and Affect: Mood normal.        Behavior: Behavior normal.        Thought Content: Thought content normal.        Judgment: Judgment normal.    Diagnostics: FVC 1.83 L, FEV1 1.34 L.  (70%) predicted FVC 2.46 L, predicted FEV1 1.91 L.  Spirometry indicates normal respiratory function.  Assessment and Plan: 1. Mild persistent asthma with (acute) exacerbation   2. Acute non-recurrent sinusitis, unspecified location   3. Seasonal allergic rhinitis due to pollen     Meds ordered this encounter  Medications   amoxicillin-clavulanate (AUGMENTIN) 875-125 MG tablet    Sig: Take 1 tablet by mouth 2 (two) times daily.    Dispense:  14 tablet    Refill:  0   benzonatate (TESSALON PERLES) 100 MG capsule    Sig: Take 1 capsule (100 mg total) by mouth 3 (three) times daily as needed for cough.    Dispense:  20 capsule    Refill:  0   Spacer/Aero-Holding Chambers (AEROCHAMBER PLUS FLO-VU LARGE) MISC    Sig: 1 each by Other route once for 1 dose.    Dispense:  1 each    Refill:  0   budesonide-formoterol (SYMBICORT) 80-4.5 MCG/ACT inhaler    Sig: Inhale 2 puffs into the lungs  2 (two) times daily. with spacer and rinse mouth afterwards.    Dispense:  11 g    Refill:  5   azelastine (ASTELIN) 0.1 % nasal spray    Sig: Place 1-2 sprays in each nostril one to two times a day as needed for runny nose/drainage down throat  Dispense:  30 mL    Refill:  5   Albuterol Sulfate (PROAIR RESPICLICK) 426 (90 Base) MCG/ACT AEPB    Sig: Inhale 2 puffs into the lungs every 4 (four) hours as needed (coughing, wheezing, shorntess of breath).    Dispense:  1 each    Refill:  1    Patient Instructions  Mild persistent asthma with acute exacerbation -We will send in a prescription for a spacer since you do not have one. Reviewed proper technique. -Start prednsione 10 mg taking 2 tablets twice a day for 3 days, then on the 4th day take 2 tablets in the morning, and on the 5th day take 1 tablet and stop -Start tessalon perles 100 mg every 8 hours as needed for cough Daily controller medication(s): For now continue Symbicort 37mcg 2 puffs twice a day with spacer  for the next 1-2 weeks and rinse mouth afterwards. Then you can decrease to Symbicort 80 mcg 2 puffs once a day with spacer From October through May.  May use albuterol rescue inhaler 2 puffs or nebulizer every 4 to 6 hours as needed for shortness of breath, chest tightness, coughing, and wheezing. May use albuterol rescue inhaler 2 puffs 5 to 15 minutes prior to strenuous physical activities. Monitor frequency of use.  During upper respiratory infections/asthma flares: Start Symbicort 53mcg 2 puffs twice a day for 1-2 weeks Asthma control goals:  Full participation in all desired activities (may need albuterol before activity) Albuterol use two times or less a week on average (not counting use with activity) Cough interfering with sleep two times or less a month Oral steroids no more than once a year No hospitalizations   Allergic rhinitis 2020 skin testing was borderline positive to tree pollen.  -Start prednisone as  above -Start Augmentin 875 mg taking 1 tablet twice a day for 7 days Continue environmental control measures.  May use azelastine nasal spray 1-2 sprays per nostril 1-2 times a day as needed for runny nose. May use over the counter antihistamines such as Zyrtec (cetirizine), Claritin (loratadine), Allegra (fexofenadine), or Xyzal (levocetirizine) daily as needed. May take twice a day during flares.  Start saline nasal rinses once to twice a day as needed. Use this prior to any medicated nasal sprays    Keep your already scheduled follow up on 12/08/21 with Dr. Maudie Mercury  or sooner if needed.  Reducing Pollen Exposure Pollen seasons: trees (spring), grass (summer) and ragweed/weeds (fall). Keep windows closed in your home and car to lower pollen exposure.  Install air conditioning in the bedroom and throughout the house if possible.  Avoid going out in dry windy days - especially early morning. Pollen counts are highest between 5 - 10 AM and on dry, hot and windy days.  Save outside activities for late afternoon or after a heavy rain, when pollen levels are lower.  Avoid mowing of grass if you have grass pollen allergy. Be aware that pollen can also be transported indoors on people and pets.  Dry your clothes in an automatic dryer rather than hanging them outside where they might collect pollen.  Rinse hair and eyes before bedtime.  Drink plenty of fluids. Water, juice, clear broth or warm lemon water are good choices. Avoid caffeine and alcohol, which can dehydrate you. Eat chicken soup. Chicken soup and other warm fluids can be soothing and loosen congestion. Rest. Adjust your room's temperature and humidity. Keep your room warm but not overheated. If the air is dry, a  cool-mist humidifier or vaporizer can moisten the air and help ease congestion and coughing. Keep the humidifier clean to prevent the growth of bacteria and molds. Soothe your throat. Perform a saltwater gargle. Dissolve  one-quarter to a half teaspoon of salt in a 4- to 8-ounce glass of warm water. This can relieve a sore or scratchy throat temporarily. Use saline nasal drops. To help relieve nasal congestion, try saline nasal drops. You can buy these drops over the counter, and they can help relieve symptoms ? even in children. Take over-the-counter cold and cough medications. For adults and children older than 5, over-the-counter decongestants, antihistamines and pain relievers might offer some symptom relief. However, they won't prevent a cold or shorten its duration.  Return in about 3 months (around 12/08/2021), or if symptoms worsen or fail to improve.    Thank you for the opportunity to care for this patient.  Please do not hesitate to contact me with questions.  Althea Charon, FNP [facility]

## 2021-09-13 NOTE — Patient Instructions (Addendum)
Mild persistent asthma with acute exacerbation -We will send in a prescription for a spacer since you do not have one. Reviewed proper technique. -Start prednsione 10 mg taking 2 tablets twice a day for 3 days, then on the 4th day take 2 tablets in the morning, and on the 5th day take 1 tablet and stop -Start tessalon perles 100 mg every 8 hours as needed for cough Daily controller medication(s): For now continue Symbicort 4mcg 2 puffs twice a day with spacer  for the next 1-2 weeks and rinse mouth afterwards. Then you can decrease to Symbicort 80 mcg 2 puffs once a day with spacer From October through May.  May use albuterol rescue inhaler 2 puffs or nebulizer every 4 to 6 hours as needed for shortness of breath, chest tightness, coughing, and wheezing. May use albuterol rescue inhaler 2 puffs 5 to 15 minutes prior to strenuous physical activities. Monitor frequency of use.  During upper respiratory infections/asthma flares: Start Symbicort 68mcg 2 puffs twice a day for 1-2 weeks Asthma control goals:  Full participation in all desired activities (may need albuterol before activity) Albuterol use two times or less a week on average (not counting use with activity) Cough interfering with sleep two times or less a month Oral steroids no more than once a year No hospitalizations   Allergic rhinitis 2020 skin testing was borderline positive to tree pollen.  -Start prednisone as above -Start Augmentin 875 mg taking 1 tablet twice a day for 7 days Continue environmental control measures.  May use azelastine nasal spray 1-2 sprays per nostril 1-2 times a day as needed for runny nose. May use over the counter antihistamines such as Zyrtec (cetirizine), Claritin (loratadine), Allegra (fexofenadine), or Xyzal (levocetirizine) daily as needed. May take twice a day during flares.  Start saline nasal rinses once to twice a day as needed. Use this prior to any medicated nasal sprays    Keep your already  scheduled follow up on 12/08/21 with Dr. Maudie Mercury  or sooner if needed.  Reducing Pollen Exposure Pollen seasons: trees (spring), grass (summer) and ragweed/weeds (fall). Keep windows closed in your home and car to lower pollen exposure.  Install air conditioning in the bedroom and throughout the house if possible.  Avoid going out in dry windy days - especially early morning. Pollen counts are highest between 5 - 10 AM and on dry, hot and windy days.  Save outside activities for late afternoon or after a heavy rain, when pollen levels are lower.  Avoid mowing of grass if you have grass pollen allergy. Be aware that pollen can also be transported indoors on people and pets.  Dry your clothes in an automatic dryer rather than hanging them outside where they might collect pollen.  Rinse hair and eyes before bedtime.  Drink plenty of fluids. Water, juice, clear broth or warm lemon water are good choices. Avoid caffeine and alcohol, which can dehydrate you. Eat chicken soup. Chicken soup and other warm fluids can be soothing and loosen congestion. Rest. Adjust your room's temperature and humidity. Keep your room warm but not overheated. If the air is dry, a cool-mist humidifier or vaporizer can moisten the air and help ease congestion and coughing. Keep the humidifier clean to prevent the growth of bacteria and molds. Soothe your throat. Perform a saltwater gargle. Dissolve one-quarter to a half teaspoon of salt in a 4- to 8-ounce glass of warm water. This can relieve a sore or scratchy throat temporarily. Use saline  nasal drops. To help relieve nasal congestion, try saline nasal drops. You can buy these drops over the counter, and they can help relieve symptoms ? even in children. Take over-the-counter cold and cough medications. For adults and children older than 5, over-the-counter decongestants, antihistamines and pain relievers might offer some symptom relief. However, they won't prevent a cold  or shorten its duration.

## 2021-10-04 NOTE — Patient Instructions (Addendum)
DUE TO COVID-19 ONLY ONE VISITOR IS ALLOWED TO COME WITH YOU AND STAY IN THE WAITING ROOM ONLY DURING PRE OP AND PROCEDURE.   **NO VISITORS ARE ALLOWED IN THE SHORT STAY AREA OR RECOVERY ROOM!!**  IF YOU WILL BE ADMITTED INTO THE HOSPITAL YOU ARE ALLOWED ONLY TWO SUPPORT PEOPLE DURING VISITATION HOURS ONLY (7 AM -8PM)    Up to two visitors ages 75+ are allowed at one time in a patient's room.  The visitors may rotate out with other people throughout the day.  Additionally, up to two children between the ages of 12 and 39 are allowed and do not count toward the number of allowed visitors.  Children within this age range must be accompanied by an adult visitor.  One adult visitor may remain with the patient overnight and must be in the room by 8 PM.  COVID SWAB TESTING MUST BE COMPLETED ON:  10-14-21 @ 9:15 AM  COME IN THROUGH MAIN ENTRANCE of [facility].  Take a seat in the lobby area to the right as you come in the main entrance.  Call [phone]  and give your name and let them know you are here for COVID testing  You are not required to quarantine, however you are required to wear a well-fitted mask when you are out and around people not in your household.  Hand Hygiene often Do NOT share personal items Notify your provider if you are in close contact with someone who has COVID or you develop fever 100.4 or greater, new onset of sneezing, cough, sore throat, shortness of breath or body aches.        Your procedure is scheduled on: Monday, 10-18-21   Report to [facility] Main  Entrance     Report to admitting at 7:15 AM   Call this number if you have problems the morning of surgery [phone]   Do not eat food :After Midnight.   May have liquids until 7:00 AM day of surgery  CLEAR LIQUID DIET  Foods Allowed                                                                     Foods Excluded  Water, Black Coffee (no milk/no creamer) and tea, regular and decaf                               liquids that you cannot  Plain Jell-O in any flavor  (No red)                         see through such as: Fruit ices (not with fruit pulp)                                 milk, soups, orange juice  Iced Popsicles (No red)                                    All solid food  Apple juices Sports drinks like Gatorade (No red) Lightly seasoned clear broth or consume(fat free) Sugar       Oral Hygiene is also important to reduce your risk of infection.                                    Remember - BRUSH YOUR TEETH THE MORNING OF SURGERY WITH YOUR REGULAR TOOTHPASTE   Do NOT smoke after Midnight   Take these medicines the morning of surgery with A SIP OF WATER:  Alprazolam.  Okay to use inhalers   Stop all vitamins and herbal supplements a week before surgery.     Stop Motrin, Aleve, Ibuprofen a week before surgery.             You may not have any metal on your body including hair pins, jewelry, and body piercing             Do not wear make-up, lotions, powders, perfumes, or deodorant  Do not wear nail polish including gel and S&S, artificial/acrylic nails, or any other type of covering on natural nails including finger and toenails. If you have artificial nails, gel coating, etc. that needs to be removed by a nail salon please have this removed prior to surgery or surgery may need to be canceled/ delayed if the surgeon/ anesthesia feels like they are unable to be safely monitored.   Do not shave  48 hours prior to surgery.        Contacts, dentures or bridgework may not be worn into surgery.  Bring small overnight bag day of surgery.  Do not bring valuables to the hospital. [place].  Special Instructions: Bring a copy of your healthcare power of attorney and living will documents the day of surgery if you haven't scanned them in before.  Please read over the following fact sheets you were given: IF YOU HAVE  QUESTIONS ABOUT YOUR PRE OP INSTRUCTIONS PLEASE CALL [facility] - Preparing for Surgery Before surgery, you can play an important role.  Because skin is not sterile, your skin needs to be as free of germs as possible.  You can reduce the number of germs on your skin by washing with CHG (chlorahexidine gluconate) soap before surgery.  CHG is an antiseptic cleaner which kills germs and bonds with the skin to continue killing germs even after washing. Please DO NOT use if you have an allergy to CHG or antibacterial soaps.  If your skin becomes reddened/irritated stop using the CHG and inform your nurse when you arrive at Short Stay. Do not shave (including legs and underarms) for at least 48 hours prior to the first CHG shower.  You may shave your face/neck.  Please follow these instructions carefully:  1.  Shower with CHG Soap the night before surgery and the  morning of surgery.  2.  If you choose to wash your hair, wash your hair first as usual with your normal  shampoo.  3.  After you shampoo, rinse your hair and body thoroughly to remove the shampoo.                             4.  Use CHG as you would any other liquid soap.  You can apply chg directly to the skin and wash.  Gently with  a scrungie or clean washcloth.  5.  Apply the CHG Soap to your body ONLY FROM THE NECK DOWN.   Do   not use on face/ open                           Wound or open sores. Avoid contact with eyes, ears mouth and   genitals (private parts).                       Wash face,  Genitals (private parts) with your normal soap.             6.  Wash thoroughly, paying special attention to the area where your    surgery  will be performed.  7.  Thoroughly rinse your body with warm water from the neck down.  8.  DO NOT shower/wash with your normal soap after using and rinsing off the CHG Soap.                9.  Pat yourself dry with a clean towel.            10.  Wear clean pajamas.            11.  Place  clean sheets on your bed the night of your first shower and do not  sleep with pets. Day of Surgery : Do not apply any lotions/deodorants the morning of surgery.  Please wear clean clothes to the hospital/surgery center.  FAILURE TO FOLLOW THESE INSTRUCTIONS MAY RESULT IN THE CANCELLATION OF YOUR SURGERY  PATIENT SIGNATURE_________________________________  NURSE SIGNATURE__________________________________  ________________________________________________________________________   Mary Garcia  An incentive spirometer is a tool that can help keep your lungs clear and active. This tool measures how well you are filling your lungs with each breath. Taking long deep breaths may help reverse or decrease the chance of developing breathing (pulmonary) problems (especially infection) following: A long period of time when you are unable to move or be active. BEFORE THE PROCEDURE  If the spirometer includes an indicator to show your best effort, your nurse or respiratory therapist will set it to a desired goal. If possible, sit up straight or lean slightly forward. Try not to slouch. Hold the incentive spirometer in an upright position. INSTRUCTIONS FOR USE  Sit on the edge of your bed if possible, or sit up as far as you can in bed or on a chair. Hold the incentive spirometer in an upright position. Breathe out normally. Place the mouthpiece in your mouth and seal your lips tightly around it. Breathe in slowly and as deeply as possible, raising the piston or the ball toward the top of the column. Hold your breath for 3-5 seconds or for as long as possible. Allow the piston or ball to fall to the bottom of the column. Remove the mouthpiece from your mouth and breathe out normally. Rest for a few seconds and repeat Steps 1 through 7 at least 10 times every 1-2 hours when you are awake. Take your time and take a few normal breaths between deep breaths. The spirometer may include an indicator  to show your best effort. Use the indicator as a goal to work toward during each repetition. After each set of 10 deep breaths, practice coughing to be sure your lungs are clear. If you have an incision (the cut made at the time of surgery), support your incision when coughing by placing a  pillow or rolled up towels firmly against it. Once you are able to get out of bed, walk around indoors and cough well. You may stop using the incentive spirometer when instructed by your caregiver.  RISKS AND COMPLICATIONS Take your time so you do not get dizzy or light-headed. If you are in pain, you may need to take or ask for pain medication before doing incentive spirometry. It is harder to take a deep breath if you are having pain. AFTER USE Rest and breathe slowly and easily. It can be helpful to keep track of a log of your progress. Your caregiver can provide you with a simple table to help with this. If you are using the spirometer at home, follow these instructions: [place] IF:  You are having difficultly using the spirometer. You have trouble using the spirometer as often as instructed. Your pain medication is not giving enough relief while using the spirometer. You develop fever of 100.5 F (38.1 C) or higher. SEEK IMMEDIATE MEDICAL CARE IF:  You cough up bloody sputum that had not been present before. You develop fever of 102 F (38.9 C) or greater. You develop worsening pain at or near the incision site. MAKE SURE YOU:  Understand these instructions. Will watch your condition. Will get help right away if you are not doing well or get worse. Document Released: 12/12/2006 Document Revised: 10/24/2011 Document Reviewed: 02/12/2007 [facility] Patient Information 2014 [place], [place].   ________________________________________________________________________

## 2021-10-04 NOTE — Progress Notes (Signed)
Anesthesia Review:  PCP: Cardiologist : Allergy Asthma- LOV- 09/13/21 Mary Charon, NP  Chest x-ray : EKG : 08/30/21 Echo : Stress test: Cardiac Cath :  Activity level:  Sleep Study/ CPAP : Fasting Blood Sugar :      / Checks Blood Sugar -- times a day:   Blood Thinner/ Instructions /Last Dose: ASA / Instructions/ Last Dose :   Hgba1c-09/13/21-5.5

## 2021-10-04 NOTE — Progress Notes (Addendum)
COVID swab appointment: 10-14-21 @ 9:15 AM  COVID Vaccine Completed: Yes x2 Date COVID Vaccine completed: 09-03-19 09-23-19 Has received booster: Yes x1 04-13-20 COVID vaccine manufacturer: [place]      Date of COVID positive in last 90 days:  No  PCP - Tedra Senegal, MD Cardiologist - N/A  Chest x-ray - N/A EKG - 08-30-21 Epic Stress Test - N/A ECHO - N/A Cardiac Cath - N/A Pacemaker/ICD device last checked: Spinal Cord Stimulator:  Bowel Prep - N/A  Sleep Study - N/A CPAP -   Fasting Blood Sugar - N/A Checks Blood Sugar _____ times a day  Blood Thinner Instructions:  N/A Aspirin Instructions: Last Dose:  Activity level:   Can go up a flight of stairs and perform activities of daily living without stopping and without symptoms of chest pain or shortness of breath.  Able to exercise without symptoms  Anesthesia review: N/A  Patient denies shortness of breath, fever, cough and chest pain at PAT appointment  Patient verbalized understanding of instructions that were given to them at the PAT appointment. Patient was also instructed that they will need to review over the PAT instructions again at home before surgery.

## 2021-10-05 ENCOUNTER — Other Ambulatory Visit: Payer: Self-pay

## 2021-10-05 ENCOUNTER — Inpatient Hospital Stay (HOSPITAL_COMMUNITY): Admission: RE | Admit: 2021-10-05 | Payer: Medicare Other | Source: Ambulatory Visit

## 2021-10-05 ENCOUNTER — Encounter (HOSPITAL_COMMUNITY): Payer: Self-pay

## 2021-10-05 ENCOUNTER — Encounter (HOSPITAL_COMMUNITY)
Admission: RE | Admit: 2021-10-05 | Discharge: 2021-10-05 | Disposition: A | Discharge status: Home / Self Care | Payer: Medicare Other | Source: Ambulatory Visit | Attending: Orthopedic Surgery | Admitting: Orthopedic Surgery

## 2021-10-05 VITALS — BP 133/94 | HR 85 | Temp 98.3°F | Resp 16 | Ht 63.0 in | Wt 139.0 lb

## 2021-10-05 DIAGNOSIS — Z01812 Encounter for preprocedural laboratory examination: Secondary | ICD-10-CM | POA: Insufficient documentation

## 2021-10-05 DIAGNOSIS — Z01818 Encounter for other preprocedural examination: Secondary | ICD-10-CM

## 2021-10-05 LAB — CBC
HCT: 42.6 % (ref 36.0–46.0)
Hemoglobin: 13.9 g/dL (ref 12.0–15.0)
MCH: 28.9 pg (ref 26.0–34.0)
MCHC: 32.6 g/dL (ref 30.0–36.0)
MCV: 88.6 fL (ref 80.0–100.0)
Platelets: 287 K/uL (ref 150–400)
RBC: 4.81 MIL/uL (ref 3.87–5.11)
RDW: 14.2 % (ref 11.5–15.5)
WBC: 4 K/uL (ref 4.0–10.5)
nRBC: 0 % (ref 0.0–0.2)

## 2021-10-05 LAB — SURGICAL PCR SCREEN
MRSA, PCR: NEGATIVE
Staphylococcus aureus: NEGATIVE

## 2021-10-07 ENCOUNTER — Other Ambulatory Visit: Payer: Self-pay

## 2021-10-07 ENCOUNTER — Encounter: Payer: Self-pay | Admitting: Physical Therapy

## 2021-10-07 ENCOUNTER — Ambulatory Visit: Payer: Medicare Other | Attending: Orthopedic Surgery | Admitting: Physical Therapy

## 2021-10-07 DIAGNOSIS — G8929 Other chronic pain: Secondary | ICD-10-CM | POA: Insufficient documentation

## 2021-10-07 DIAGNOSIS — R600 Localized edema: Secondary | ICD-10-CM | POA: Insufficient documentation

## 2021-10-07 DIAGNOSIS — R262 Difficulty in walking, not elsewhere classified: Secondary | ICD-10-CM | POA: Insufficient documentation

## 2021-10-07 DIAGNOSIS — M6281 Muscle weakness (generalized): Secondary | ICD-10-CM | POA: Insufficient documentation

## 2021-10-07 DIAGNOSIS — M25562 Pain in left knee: Secondary | ICD-10-CM | POA: Insufficient documentation

## 2021-10-07 NOTE — Therapy (Signed)
OUTPATIENT PHYSICAL THERAPY LOWER EXTREMITY EVALUATION   Patient Name: Mary Garcia MRN: [id number] DOB:[birth year], 76 y.o., female Today's Date: 10/07/2021   PT End of Session - 10/07/21 2105     Visit Number 1    Number of Visits 16    Date for PT Re-Evaluation 12/09/21    Authorization Type [employer] MCR 6th visit FOTO  10 th visit FOTO    Progress Note Due on Visit 10    PT Start Time 1330    PT Stop Time 1429    PT Time Calculation (min) 59 min    Activity Tolerance Patient tolerated treatment well    Behavior During Therapy WFL for tasks assessed/performed             Past Medical History:  Diagnosis Date   Anxiety    Arthritis    Asthma    Bronchitis, acute    Past Surgical History:  Procedure Laterality Date   BREAST ENHANCEMENT SURGERY     CATARACT EXTRACTION W/ INTRAOCULAR LENS IMPLANT Bilateral    FACELIFT     local   ROTATOR CUFF REPAIR Right    TIBIA FRACTURE SURGERY  2004   left leg x3, tibia and fibula   TUMOR EXCISION  2003   right muscle back, benign   Patient Active Problem List   Diagnosis Date Noted   Seasonal allergic rhinitis due to pollen 05/17/2021   Mild persistent asthma without complication 13/24/4010   Other allergic rhinitis 03/27/2019   DDD (degenerative disc disease), cervical 09/15/2017   Depression 02/02/2016   Arthropathy of right shoulder 02/02/2016   Insomnia 04/13/2011   Osteoporosis 04/13/2011   Biliary dyskinesia 04/13/2011   Osteoarthritis 04/13/2011   History of migraine headaches 04/13/2011   Vitamin D deficiency 04/13/2011   Anxiety 04/13/2011   Irritable bowel syndrome 04/13/2011    PCP: Elby Showers, MD  REFERRING PROVIDER: Vickey Huger, MD  REFERRING DIAG: S/P LT TKA,TIBIA ROD REMOVAL   THERAPY DIAG:  Chronic pain of left knee - Plan: PT plan of care cert/re-cert  Difficulty in walking, not elsewhere classified - Plan: PT plan of care cert/re-cert  Muscle weakness (generalized) - Plan: PT plan of care  cert/re-cert  Localized edema - Plan: PT plan of care cert/re-cert  ONSET DATE: Surgery for LTKA on 10-18-21  SUBJECTIVE:   SUBJECTIVE STATEMENT: 76 yo presents with end stage arthritis and schedule for L TKA on 10-18-21.  Pt will have rod removed from previous surgery and then perform TKA on same day. Mary Garcia is here  for prehab and then to schedule.  She would like to return to yoga and pickleball eventually and return to an active lifestyle  PERTINENT HISTORY: 19 years ago I fell and broke the Left lower leg and  rod/plates. Bronchitis, asthma OA  PAIN:  Are you having pain? Yes prehab NPRS scale: 5/10 prehab Pain location: Left knee Pain orientation: Left, Medial, and Posterior  PAIN TYPE: constant pain aching , throbbing Pain description: constant  Aggravating factors: moving it walking , steps , standing for more than 10 minutes Relieving factors: nothing/medication   PRECAUTIONS: None  WEIGHT BEARING RESTRICTIONS Yes WBAT L  on Rolling walker  FALLS:  Has patient fallen in last 6 months? No, Number of falls: 0  LIVING ENVIRONMENT: Lives with: lives with their spouse Lives in: House/apartment Stairs: Yes; Internal: 12 steps; can reach both and External: 5 steps; can reach both Has following equipment at home: Gilford Rile - 2 wheeled  shower seat, walking stick  lofstrand crutches  OCCUPATION: retired Copywriter, advertising  PLOF: Independent  PATIENT GOALS To prepare and have a successful LTKA, I want to get back to hiking and walking, to [place]. Return to yoga.    OBJECTIVE:   DIAGNOSTIC FINDINGS: IMPRESSION: 05/17/21 1. Nondepressed, nondisplaced subchondral insufficiency fracture of the medial tibial plateau with severe surrounding bone marrow edema. 2.  Tricompartmental cartilage abnormalities as described above. 3. Radial tear of the posterior horn of the medial meniscus with peripheral meniscal extrusion.  PATIENT SURVEYS:  FOTO 33% to  57% predicted  COGNITION:  Overall cognitive status: Within functional limits for tasks assessed     SENSATION:  Light touch: Appears intact  Stereognosis: Appears intact  Hot/Cold: Appears intact  Proprioception: Appears intact  MUSCLE LENGTH: Hamstrings:  WNL   POSTURE:   [place] female with average build, forward head and rounded shld, valgus Left knee  PALPATION: TTP over medial aspect of Left knee  LE AROM/PROM:  A/PROM Right A/P 10/07/2021 Left 10/07/2021  Hip flexion 110 110  Hip extension    Hip abduction    Hip adduction    Hip internal rotation    Hip external rotation    Knee flexion 136/140 130/138 painful  Knee extension 0 4  Ankle dorsiflexion    Ankle plantarflexion    Ankle inversion    Ankle eversion     (Blank rows = not tested)  LE MMT:  MMT Right 10/07/2021 Left 10/07/2021  Hip flexion 5 5  Hip extension 5 5  Hip abduction 5 4  Hip adduction    Hip internal rotation    Hip external rotation    Knee flexion 5 4  Knee extension 5 4+ pain  Ankle dorsiflexion 5 5  Ankle plantarflexion 5 5  Ankle inversion    Ankle eversion     (Blank rows = not tested)  LOWER EXTREMITY SPECIAL TESTS:  NT  FUNCTIONAL TESTS:  5 times sit to stand: 16.3 sec Squat - wt shift to R only able to squat ot 60 degree flexion  GAIT:  Pre - surgery for TKA Distance walked: 150 Assistive device utilized: None Level of assistance: Complete Independence Comments: Pt with valgus L knee and antalgic gait to unweight painful L LE    TODAY'S TREATMENT:  Self care for initial HEP for phase 1 of TKA,  How to measure AROM at home for pt. Cryotherapy, Timeline for TKA healing/expectations for early phase post surgery   PATIENT EDUCATION:  Education details:  POC, FOTO report, How to measure AROM at home for pt. Cryotherapy, Timeline for TKA healing/expectations for early phase post surgery, intial HEP Person educated: Patient Education method: Explanation,  Demonstration, Tactile cues, Verbal cues, and Handouts Education comprehension: verbalized understanding, returned demonstration, and needs further education   HOME EXERCISE PROGRAM: Access Code: [email or web address] URL: [email or web address] Date: 10/07/2021 Prepared by: Voncille Lo  Exercises Supine Knee Extension Stretch on Towel Roll - 1 x daily - 7 x weekly - 3 sets - 10 reps Supine Heel Slide with Strap - 1 x daily - 7 x weekly - 3 sets - 10 reps Supine Short Arc Quad - 1 x daily - 7 x weekly - 3 sets - 10 reps Hamstring Stretch with Strap - 1 x daily - 7 x weekly - 3 sets - 10 reps Long Arc Quad - 1 x daily - 7 x weekly - 3 sets - 10 reps Mini Squat -  1 x daily - 7 x weekly - 3 sets - 10 reps   ASSESSMENT:  CLINICAL IMPRESSION: Patient is a 76 y.o. female who was seen today for physical therapy evaluation and treatment for prehab for L TKA and rod removal on 10-18-21. Mrs. Antunes will need skilled PT to maximize AROM and strength post surgery.    OBJECTIVE IMPAIRMENTS Abnormal gait, decreased activity tolerance, decreased balance, decreased knowledge of condition, decreased knowledge of use of DME, decreased mobility, decreased ROM, decreased strength, pain, and edema .   ACTIVITY LIMITATIONS cleaning, driving, meal prep, laundry, and shopping.   PERSONAL FACTORS 19 years ago I fell and broke the Left lower leg and  rod/plates. Bronchitis, asthma OA are also affecting patient's functional outcome.    REHAB POTENTIAL: Excellent  CLINICAL DECISION MAKING: Stable/uncomplicated  EVALUATION COMPLEXITY: Low   GOALS: Goals reviewed with patient? Yes  SHORT TERM GOALS:  STG Name Target Date Goal status       1 Demonstrate and verbalize understanding of condition management including RICE, positioning, use of A.D., HEP.  Baseline:  11/04/2021 INITIAL  2 Pt will be independent with intial HEP Baseline:  11/04/2021 INITIAL  3 Report pain decrease at rest from  5 /10   pre suregery to   3/10. Baseline: 11/04/2021 INITIAL  4 Pt will ambulate with LRAD and without limp Baseline: 11/04/2021 INITIAL  5 AROM of knee extension -10 to 100 flexion to increase mobility for transitional movements Baseline: 11/04/2021 INITIAL   LONG TERM GOALS:   LTG Name Target Date Goal status  1 Pt will be independent with advanced HEP in order to return to private personal trainer Baseline: 12/09/21 INITIAL  2 Pt will improve her L knee flexion to  >/= 120 degrees and extension to </= 5 degrees with </= 2/10 pain for a more functional and efficient gait pattern Baseline: 12/09/21 INITIAL  3 Pt will be able to negotiate steps without exacerbation of pain greater than 1/10  Baseline: 12/09/21 INITIAL  4 FOTO will improve from  33%  to  57%   indicating improved functional mobility  Baseline:eval 33% 12/09/21 INITIAL  5 Pt will be able to walk/stand >/= 1 hour with no AD with </= 2/10 pain for functional endurance and return to leisure activities post DC Baseline: Pt post TKA 10-18-21 12/09/21 INITIAL  6 Pt will be able to perform 5 x STS in 13.0 sec or less to show increase LE strength and decrease risk of fall Baseline: 5 x STS 16.3 sec 12/09/21 INITIAL   PLAN: PT FREQUENCY:  3 x the 3rd post surgical week then 2x/week  PT DURATION: other: 9 weeks  PLANNED INTERVENTIONS: Therapeutic exercises, Therapeutic activity, Neuro Muscular re-education, Balance training, Gait training, Patient/Family education, Joint mobilization, Stair training, Electrical stimulation, Cryotherapy, Moist heat, scar mobilization, Taping, Vasopneumatic device, and Manual therapy  PLAN FOR NEXT SESSION: Assess pt post L TKA surgery scheduled for 10-18-21   Voncille Lo, PT, [place] Certified Exercise Expert for the Aging Adult  10/07/21 9:37 PM Phone: [phone] Fax: [phone]

## 2021-10-07 NOTE — Patient Instructions (Signed)
Access Code: [email or web address] URL: [email or web address] Date: 10/07/2021 Prepared by: Voncille Lo  Exercises Supine Knee Extension Stretch on Towel Roll - 1 x daily - 7 x weekly - 3 sets - 10 reps Supine Heel Slide with Strap - 1 x daily - 7 x weekly - 3 sets - 10 reps Supine Short Arc Quad - 1 x daily - 7 x weekly - 3 sets - 10 reps Hamstring Stretch with Strap - 1 x daily - 7 x weekly - 3 sets - 10 reps Long Arc Quad - 1 x daily - 7 x weekly - 3 sets - 10 reps Mini Squat - 1 x daily - 7 x weekly - 3 sets - 10 reps  HOW TO [place]  Now that you have your total knee replacement, the first most important thing is to increase Active Range of Motion AROM.  Your therapist will measure using a goniometer in the clinic, but you can also keep track of your progress at home.  You will be given a home exercise program, but you will need to also stretch at home.  Below are some tools to help you accomplish this. TRACK IT Get a sturdy kitchen chair at home and back it up against a wall.  You need a floor with a low friction surface, so you can slide your foot easily. You may also use a cookie sheet if you are on carpeted floor. Take a yard stick or painters tape/masking tape laid down perpendicular to the wall. Sit in chair.  Slide your leg back as far as you can with a 3 second pull, then release.  Repeat these 10 times holding the last repetition 10-15 seconds.  Mark place where your Great toe is on maximum stretch in sitting. This is your beginning [employer]. Do 3 sets of 10 at or beyond the [facility] of your previous sessions. Record your toe mark at the end of your first and last session daily.  Cryotherapy Cryotherapy means treatment with cold. Ice or gel packs can be used to reduce both pain and swelling. Ice is the most helpful within the first 24 to 48 hours after an injury or flare-up from overusing a muscle or joint. Sprains, strains, spasms, burning  pain, shooting pain, and aches can all be eased with ice. Ice can also be used when recovering from surgery. Ice is effective, has very few side effects, and is safe for most people to use. PRECAUTIONS  Ice is not a safe treatment option for people with: Raynaud phenomenon. This is a condition affecting small blood vessels in the extremities. Exposure to cold may cause your problems to return. Cold hypersensitivity. There are many forms of cold hypersensitivity, including: Cold urticaria. Red, itchy hives appear on the skin when the tissues begin to warm after being iced. Cold erythema. This is a red, itchy rash caused by exposure to cold. Cold hemoglobinuria. Red blood cells break down when the tissues begin to warm after being iced. The hemoglobin that carry oxygen are passed into the urine because they cannot combine with blood proteins fast enough. Numbness or altered sensitivity in the area being iced. If you have any of the following conditions, do not use ice until you have discussed cryotherapy with your caregiver: Heart conditions, such as arrhythmia, angina, or chronic heart disease. High blood pressure. Healing wounds or open skin in the area being iced. Current infections. Rheumatoid arthritis. Poor circulation. Diabetes. Ice slows the blood flow  in the region it is applied. This is beneficial when trying to stop inflamed tissues from spreading irritating chemicals to surrounding tissues. However, if you expose your skin to cold temperatures for too long or without the proper protection, you can damage your skin or nerves. Watch for signs of skin damage due to cold. HOME CARE INSTRUCTIONS Follow these tips to use ice and cold packs safely. Place a dry or damp towel between the ice and skin. A damp towel will cool the skin more quickly, so you may need to shorten the time that the ice is used. For a more rapid response, add gentle compression to the ice. Ice for no more than 10 to 20  minutes at a time. The bonier the area you are icing, the less time it will take to get the benefits of ice. Check your skin after 5 minutes to make sure there are no signs of a poor response to cold or skin damage. Rest 20 minutes or more between uses. Once your skin is numb, you can end your treatment. You can test numbness by very lightly touching your skin. The touch should be so light that you do not see the skin dimple from the pressure of your fingertip. When using ice, most people will feel these normal sensations in this order: cold, burning, aching, and numbness. Do not use ice on someone who cannot communicate their responses to pain, such as small children or people with dementia. HOW TO MAKE AN ICE PACK Ice packs are the most common way to use ice therapy. Other methods include ice massage, ice baths, and cryosprays. Muscle creams that cause a cold, tingly feeling do not offer the same benefits that ice offers and should not be used as a substitute unless recommended by your caregiver. To make an ice pack, do one of the following: Place crushed ice or a bag of frozen vegetables in a sealable plastic bag. Squeeze out the excess air. Place this bag inside another plastic bag. Slide the bag into a pillowcase or place a damp towel between your skin and the bag. Mix 3 parts water with 1 part rubbing alcohol. Freeze the mixture in a sealable plastic bag. When you remove the mixture from the freezer, it will be slushy. Squeeze out the excess air. Place this bag inside another plastic bag. Slide the bag into a pillowcase or place a damp towel between your skin and the bag. SEEK MEDICAL CARE IF: You develop white spots on your skin. This may give the skin a blotchy (mottled) appearance. Your skin turns blue or pale. Your skin becomes waxy or hard. Your swelling gets worse. MAKE SURE YOU:  Understand these instructions. Will watch your condition. Will get help right away if you are not doing  well or get worse. Document Released: 03/28/2011 Document Revised: 12/16/2013 Document Reviewed: 03/28/2011 [facility] Patient Information 2015 [place], [place]. This information is not intended to replace advice given to you by your health care provider. Make sure you discuss any questions you have with your health care provider.   Voncille Lo, PT, [place] Certified Exercise Expert for the Aging Adult  10/07/21 2:29 PM Phone: [phone] Fax: [phone]

## 2021-10-11 ENCOUNTER — Other Ambulatory Visit: Payer: Self-pay | Admitting: Orthopedic Surgery

## 2021-10-11 DIAGNOSIS — G8929 Other chronic pain: Secondary | ICD-10-CM

## 2021-10-14 ENCOUNTER — Encounter (HOSPITAL_COMMUNITY)
Admission: RE | Admit: 2021-10-14 | Discharge: 2021-10-14 | Disposition: A | Discharge status: Home / Self Care | Payer: Medicare Other | Source: Ambulatory Visit | Attending: Orthopedic Surgery | Admitting: Orthopedic Surgery

## 2021-10-14 ENCOUNTER — Other Ambulatory Visit: Payer: Self-pay

## 2021-10-14 DIAGNOSIS — Z20822 Contact with and (suspected) exposure to covid-19: Secondary | ICD-10-CM | POA: Diagnosis not present

## 2021-10-14 DIAGNOSIS — Z01812 Encounter for preprocedural laboratory examination: Secondary | ICD-10-CM | POA: Diagnosis not present

## 2021-10-14 LAB — SARS CORONAVIRUS 2 (TAT 6-24 HRS): SARS Coronavirus 2: NEGATIVE

## 2021-10-15 NOTE — Progress Notes (Signed)
Called patient about time change for surgery on 10/18/21. Patient to arrive 0945 for 1230 surgery. She verbalizes understanding. ?

## 2021-10-17 NOTE — Anesthesia Preprocedure Evaluation (Addendum)
Anesthesia Evaluation  ?Patient identified by MRN, date of birth, ID band ?Patient awake ? ? ? ?Reviewed: ?Allergy & Precautions, NPO status , Patient's Chart, lab work & pertinent test results ? ?Airway ?Mallampati: II ? ?TM Distance: >3 FB ?Neck ROM: Full ? ? ? Dental ?no notable dental hx. ?(+) Teeth Intact, Implants, Dental Advisory Given ?  ?Pulmonary ?asthma , former smoker,  ?  ?Pulmonary exam normal ?breath sounds clear to auscultation ? ? ? ? ? ? Cardiovascular ?Exercise Tolerance: Good ?Normal cardiovascular exam ?Rhythm:Regular Rate:Normal ? ? ?  ?Neuro/Psych ?Anxiety   ? GI/Hepatic ?  ?Endo/Other  ?negative endocrine ROS ? Renal/GU ?Lab Results ?     Component                Value               Date                 ?     CREATININE               0.69                08/24/2021            ?     K                        5.3                 08/24/2021           ?        ?  ? ?  ?Musculoskeletal ? ?(+) Arthritis ,  ? Abdominal ?  ?Peds ? Hematology ?Lab Results ?     Component                Value               Date                  ?     HGB                      13.9                10/05/2021           ?     HCT                      42.6                10/05/2021                   ?     PLT                      287                 10/05/2021           ?   ?Anesthesia Other Findings ? ? Reproductive/Obstetrics ? ?  ? ? ? ? ? ? ? ? ? ? ? ? ? ?  ?  ? ? ? ? ? ? ? ?Anesthesia Physical ?Anesthesia Plan ? ?ASA: 2 ? ?Anesthesia Plan: Spinal  ? ?Post-op Pain Management: Regional block* and Minimal or no pain anticipated  ? ?Induction:  ? ?PONV Risk Score and Plan: 3 and Treatment may vary due  to age or medical condition, Ondansetron and Midazolam ? ?Airway Management Planned: Natural Airway and Nasal Cannula ? ?Additional Equipment: None ? ?Intra-op Plan:  ? ?Post-operative Plan:  ? ?Informed Consent: I have reviewed the patients History and Physical, chart, labs and discussed the  procedure including the risks, benefits and alternatives for the proposed anesthesia with the patient or authorized representative who has indicated his/her understanding and acceptance.  ? ? ? ?Dental advisory given ? ?Plan Discussed with: CRNA ? ?Anesthesia Plan Comments: (Spinal w L adductor)  ? ? ? ? ? ?Anesthesia Quick Evaluation ? ?

## 2021-10-18 ENCOUNTER — Other Ambulatory Visit: Payer: Self-pay

## 2021-10-18 ENCOUNTER — Encounter (HOSPITAL_COMMUNITY): Payer: Self-pay | Admitting: Orthopedic Surgery

## 2021-10-18 ENCOUNTER — Ambulatory Visit (HOSPITAL_BASED_OUTPATIENT_CLINIC_OR_DEPARTMENT_OTHER): Payer: Medicare Other | Admitting: Anesthesiology

## 2021-10-18 ENCOUNTER — Encounter (HOSPITAL_COMMUNITY)
Admission: RE | Disposition: A | Discharge status: Home / Self Care | Payer: Self-pay | Source: Home / Self Care | Attending: Orthopedic Surgery

## 2021-10-18 ENCOUNTER — Ambulatory Visit (HOSPITAL_COMMUNITY): Payer: Medicare Other | Admitting: Anesthesiology

## 2021-10-18 ENCOUNTER — Ambulatory Visit (HOSPITAL_COMMUNITY)
Admission: RE | Admit: 2021-10-18 | Discharge: 2021-10-18 | Disposition: A | Discharge status: Home / Self Care | Payer: Medicare Other | Attending: Orthopedic Surgery | Admitting: Orthopedic Surgery

## 2021-10-18 DIAGNOSIS — J4520 Mild intermittent asthma, uncomplicated: Secondary | ICD-10-CM | POA: Diagnosis not present

## 2021-10-18 DIAGNOSIS — Z96652 Presence of left artificial knee joint: Secondary | ICD-10-CM | POA: Diagnosis not present

## 2021-10-18 DIAGNOSIS — Z01812 Encounter for preprocedural laboratory examination: Secondary | ICD-10-CM

## 2021-10-18 DIAGNOSIS — Z87891 Personal history of nicotine dependence: Secondary | ICD-10-CM | POA: Diagnosis not present

## 2021-10-18 DIAGNOSIS — G8929 Other chronic pain: Secondary | ICD-10-CM

## 2021-10-18 DIAGNOSIS — M25562 Pain in left knee: Secondary | ICD-10-CM

## 2021-10-18 DIAGNOSIS — M1712 Unilateral primary osteoarthritis, left knee: Secondary | ICD-10-CM | POA: Insufficient documentation

## 2021-10-18 DIAGNOSIS — G8918 Other acute postprocedural pain: Secondary | ICD-10-CM | POA: Diagnosis not present

## 2021-10-18 DIAGNOSIS — F419 Anxiety disorder, unspecified: Secondary | ICD-10-CM | POA: Insufficient documentation

## 2021-10-18 DIAGNOSIS — Z01818 Encounter for other preprocedural examination: Secondary | ICD-10-CM

## 2021-10-18 DIAGNOSIS — Z20822 Contact with and (suspected) exposure to covid-19: Secondary | ICD-10-CM | POA: Insufficient documentation

## 2021-10-18 HISTORY — PX: TOTAL KNEE ARTHROPLASTY: SHX125

## 2021-10-18 LAB — COMPREHENSIVE METABOLIC PANEL
ALT: 18 U/L (ref 0–44)
AST: 27 U/L (ref 15–41)
Albumin: 4.3 g/dL (ref 3.5–5.0)
Alkaline Phosphatase: 57 U/L (ref 38–126)
Anion gap: 9 (ref 5–15)
BUN: 21 mg/dL (ref 8–23)
CO2: 24 mmol/L (ref 22–32)
Calcium: 9.2 mg/dL (ref 8.9–10.3)
Chloride: 105 mmol/L (ref 98–111)
Creatinine, Ser: 0.65 mg/dL (ref 0.44–1.00)
GFR, Estimated: >60 mL/min (ref >60)
Glucose, Bld: 100 mg/dL — ABNORMAL HIGH (ref 70–99)
Potassium: 4.1 mmol/L (ref 3.5–5.1)
Sodium: 138 mmol/L (ref 135–145)
Total Bilirubin: 0.7 mg/dL (ref 0.3–1.2)
Total Protein: 7.1 g/dL (ref 6.5–8.1)

## 2021-10-18 LAB — CBC WITH DIFFERENTIAL/PLATELET
Abs Immature Granulocytes: 0.01 10*3/uL (ref 0.00–0.07)
Basophils Absolute: 0 10*3/uL (ref 0.0–0.1)
Basophils Relative: 0 %
Eosinophils Absolute: 0.1 10*3/uL (ref 0.0–0.5)
Eosinophils Relative: 2 %
HCT: 42.7 % (ref 36.0–46.0)
Hemoglobin: 14.3 g/dL (ref 12.0–15.0)
Immature Granulocytes: 0 %
Lymphocytes Relative: 26 %
Lymphs Abs: 1.1 10*3/uL (ref 0.7–4.0)
MCH: 28.9 pg (ref 26.0–34.0)
MCHC: 33.5 g/dL (ref 30.0–36.0)
MCV: 86.3 fL (ref 80.0–100.0)
Monocytes Absolute: 0.4 10*3/uL (ref 0.1–1.0)
Monocytes Relative: 9 %
Neutro Abs: 2.5 10*3/uL (ref 1.7–7.7)
Neutrophils Relative %: 63 %
Platelets: 269 10*3/uL (ref 150–400)
RBC: 4.95 MIL/uL (ref 3.87–5.11)
RDW: 14.4 % (ref 11.5–15.5)
WBC: 4 10*3/uL (ref 4.0–10.5)
nRBC: 0 % (ref 0.0–0.2)

## 2021-10-18 SURGERY — ARTHROPLASTY, KNEE, TOTAL
Anesthesia: Spinal | Site: Knee | Laterality: Left

## 2021-10-18 MED ORDER — DEXAMETHASONE SODIUM PHOSPHATE 10 MG/ML IJ SOLN
INTRAMUSCULAR | Status: DC | PRN
Start: 2021-10-18 — End: 2021-10-18
  Administered 2021-10-18: 4 mg via INTRAVENOUS

## 2021-10-18 MED ORDER — BUPIVACAINE LIPOSOME 1.3 % IJ SUSP
INTRAMUSCULAR | Status: AC
  Filled 2021-10-18: qty 20

## 2021-10-18 MED ORDER — METHOCARBAMOL 500 MG PO TABS
500.0000 mg | ORAL_TABLET | Freq: Four times a day (QID) | ORAL | Status: DC | PRN
  Administered 2021-10-18: 500 mg via ORAL

## 2021-10-18 MED ORDER — LACTATED RINGERS IV SOLN: INTRAVENOUS | Status: DC

## 2021-10-18 MED ORDER — DEXAMETHASONE SODIUM PHOSPHATE 10 MG/ML IJ SOLN: 8.0000 mg | Freq: Once | INTRAMUSCULAR | Status: DC

## 2021-10-18 MED ORDER — POVIDONE-IODINE 10 % EX SWAB
2.0000 | Freq: Once | CUTANEOUS | Status: AC
Start: 2021-10-18 — End: 2021-10-18
  Administered 2021-10-18: 2 via TOPICAL

## 2021-10-18 MED ORDER — GABAPENTIN 300 MG PO CAPS
300.0000 mg | ORAL_CAPSULE | Freq: Once | ORAL | Status: AC
  Administered 2021-10-18: 300 mg via ORAL
  Filled 2021-10-18: qty 1

## 2021-10-18 MED ORDER — BUPIVACAINE-EPINEPHRINE (PF) 0.25% -1:200000 IJ SOLN
INTRAMUSCULAR | Status: AC
  Filled 2021-10-18: qty 30

## 2021-10-18 MED ORDER — PHENYLEPHRINE HCL-NACL 20-0.9 MG/250ML-% IV SOLN
INTRAVENOUS | Status: DC | PRN
  Administered 2021-10-18: 25 ug/min via INTRAVENOUS

## 2021-10-18 MED ORDER — DEXMEDETOMIDINE (PRECEDEX) IN NS 20 MCG/5ML (4 MCG/ML) IV SYRINGE
PREFILLED_SYRINGE | INTRAVENOUS | Status: DC | PRN
  Administered 2021-10-18: 8 ug via INTRAVENOUS

## 2021-10-18 MED ORDER — CHLORHEXIDINE GLUCONATE 0.12 % MT SOLN
15.0000 mL | Freq: Once | OROMUCOSAL | Status: AC
  Administered 2021-10-18: 15 mL via OROMUCOSAL

## 2021-10-18 MED ORDER — ROPIVACAINE HCL 5 MG/ML IJ SOLN
INTRAMUSCULAR | Status: DC | PRN
  Administered 2021-10-18: 30 mL via PERINEURAL

## 2021-10-18 MED ORDER — ACETAMINOPHEN 10 MG/ML IV SOLN: 1000.0000 mg | Freq: Once | INTRAVENOUS | Status: DC | PRN

## 2021-10-18 MED ORDER — BUPIVACAINE IN DEXTROSE 0.75-8.25 % IT SOLN
INTRATHECAL | Status: DC | PRN
  Administered 2021-10-18: 11.25 mg via INTRATHECAL

## 2021-10-18 MED ORDER — ASPIRIN EC 325 MG PO TBEC: 325.0000 mg | DELAYED_RELEASE_TABLET | Freq: Two times a day (BID) | ORAL | 0 refills | Status: AC

## 2021-10-18 MED ORDER — OXYCODONE HCL 5 MG PO TABS
5.0000 mg | ORAL_TABLET | ORAL | 0 refills | Status: DC | PRN
Start: 2021-10-18 — End: 2021-12-08

## 2021-10-18 MED ORDER — OXYCODONE HCL 5 MG PO TABS
ORAL_TABLET | ORAL | Status: AC
  Filled 2021-10-18: qty 1

## 2021-10-18 MED ORDER — SODIUM CHLORIDE 0.9% FLUSH
INTRAVENOUS | Status: DC | PRN
  Administered 2021-10-18: 20 mL via INTRAVENOUS

## 2021-10-18 MED ORDER — LACTATED RINGERS IV BOLUS
250.0000 mL | Freq: Once | INTRAVENOUS | Status: AC
  Administered 2021-10-18: 250 mL via INTRAVENOUS

## 2021-10-18 MED ORDER — LIDOCAINE 2% (20 MG/ML) 5 ML SYRINGE
INTRAMUSCULAR | Status: DC | PRN
  Administered 2021-10-18: 50 mg via INTRAVENOUS

## 2021-10-18 MED ORDER — BUPIVACAINE LIPOSOME 1.3 % IJ SUSP: 20.0000 mL | Freq: Once | INTRAMUSCULAR | Status: DC

## 2021-10-18 MED ORDER — BUPIVACAINE LIPOSOME 1.3 % IJ SUSP
INTRAMUSCULAR | Status: DC | PRN
Start: 2021-10-18 — End: 2021-10-18
  Administered 2021-10-18: 20 mL

## 2021-10-18 MED ORDER — ORAL CARE MOUTH RINSE: 15.0000 mL | Freq: Once | OROMUCOSAL | Status: AC

## 2021-10-18 MED ORDER — PROPOFOL 10 MG/ML IV BOLUS
INTRAVENOUS | Status: DC | PRN
  Administered 2021-10-18: 40 mg via INTRAVENOUS

## 2021-10-18 MED ORDER — SODIUM CHLORIDE 0.9 % IR SOLN
Status: DC | PRN
  Administered 2021-10-18: 1000 mL

## 2021-10-18 MED ORDER — METHOCARBAMOL 500 MG IVPB - SIMPLE MED: 500.0000 mg | Freq: Four times a day (QID) | INTRAVENOUS | Status: DC | PRN

## 2021-10-18 MED ORDER — LACTATED RINGERS IV BOLUS
500.0000 mL | Freq: Once | INTRAVENOUS | Status: AC
  Administered 2021-10-18: 500 mL via INTRAVENOUS

## 2021-10-18 MED ORDER — SODIUM CHLORIDE (PF) 0.9 % IJ SOLN
INTRAMUSCULAR | Status: AC
  Filled 2021-10-18: qty 20

## 2021-10-18 MED ORDER — DEXMEDETOMIDINE (PRECEDEX) IN NS 20 MCG/5ML (4 MCG/ML) IV SYRINGE
PREFILLED_SYRINGE | INTRAVENOUS | Status: AC
  Filled 2021-10-18: qty 5

## 2021-10-18 MED ORDER — PROPOFOL 500 MG/50ML IV EMUL
INTRAVENOUS | Status: DC | PRN
  Administered 2021-10-18: 95 ug/kg/min via INTRAVENOUS

## 2021-10-18 MED ORDER — METHOCARBAMOL 500 MG PO TABS
ORAL_TABLET | ORAL | Status: AC
  Filled 2021-10-18: qty 1

## 2021-10-18 MED ORDER — CEFAZOLIN SODIUM-DEXTROSE 2-4 GM/100ML-% IV SOLN
2.0000 g | INTRAVENOUS | Status: AC
  Administered 2021-10-18: 2 g via INTRAVENOUS
  Filled 2021-10-18: qty 100

## 2021-10-18 MED ORDER — PHENYLEPHRINE 40 MCG/ML (10ML) SYRINGE FOR IV PUSH (FOR BLOOD PRESSURE SUPPORT)
PREFILLED_SYRINGE | INTRAVENOUS | Status: DC | PRN
  Administered 2021-10-18: 120 ug via INTRAVENOUS

## 2021-10-18 MED ORDER — ONDANSETRON HCL 4 MG/2ML IJ SOLN
INTRAMUSCULAR | Status: DC | PRN
  Administered 2021-10-18: 4 mg via INTRAVENOUS

## 2021-10-18 MED ORDER — ACETAMINOPHEN 500 MG PO TABS
1000.0000 mg | ORAL_TABLET | Freq: Once | ORAL | Status: AC
  Administered 2021-10-18: 1000 mg via ORAL
  Filled 2021-10-18: qty 2

## 2021-10-18 MED ORDER — TRANEXAMIC ACID-NACL 1000-0.7 MG/100ML-% IV SOLN
1000.0000 mg | INTRAVENOUS | Status: AC
  Administered 2021-10-18: 1000 mg via INTRAVENOUS
  Filled 2021-10-18: qty 100

## 2021-10-18 MED ORDER — BUPIVACAINE-EPINEPHRINE 0.25% -1:200000 IJ SOLN
INTRAMUSCULAR | Status: DC | PRN
Start: 2021-10-18 — End: 2021-10-18
  Administered 2021-10-18: 30 mL

## 2021-10-18 MED ORDER — OXYCODONE HCL 5 MG PO TABS
5.0000 mg | ORAL_TABLET | ORAL | Status: DC | PRN
  Administered 2021-10-18 (×2): 5 mg via ORAL

## 2021-10-18 MED ORDER — MIDAZOLAM HCL 2 MG/2ML IJ SOLN
1.0000 mg | INTRAMUSCULAR | Status: DC
  Administered 2021-10-18: 2 mg via INTRAVENOUS
  Filled 2021-10-18: qty 2

## 2021-10-18 MED ORDER — PHENYLEPHRINE 40 MCG/ML (10ML) SYRINGE FOR IV PUSH (FOR BLOOD PRESSURE SUPPORT)
PREFILLED_SYRINGE | INTRAVENOUS | Status: AC
  Filled 2021-10-18: qty 10

## 2021-10-18 MED ORDER — FENTANYL CITRATE PF 50 MCG/ML IJ SOSY
50.0000 ug | PREFILLED_SYRINGE | INTRAMUSCULAR | Status: DC
  Administered 2021-10-18: 50 ug via INTRAVENOUS
  Filled 2021-10-18: qty 2

## 2021-10-18 MED ORDER — ONDANSETRON HCL 4 MG/2ML IJ SOLN: 4.0000 mg | Freq: Once | INTRAMUSCULAR | Status: DC | PRN

## 2021-10-18 MED ORDER — METHOCARBAMOL 500 MG PO TABS
500.0000 mg | ORAL_TABLET | Freq: Four times a day (QID) | ORAL | 0 refills | Status: DC
Start: 2021-10-18 — End: 2021-11-08

## 2021-10-18 MED ORDER — FENTANYL CITRATE PF 50 MCG/ML IJ SOSY: 25.0000 ug | PREFILLED_SYRINGE | INTRAMUSCULAR | Status: DC | PRN

## 2021-10-18 MED ORDER — OXYCODONE HCL 5 MG PO TABS
ORAL_TABLET | ORAL | Status: AC
  Filled 2021-10-18: qty 2

## 2021-10-18 SURGICAL SUPPLY — 56 items
ARTISURF 11M PLY L 6-9CD KNEE (Knees) ×1 IMPLANT
BAG COUNTER SPONGE SURGICOUNT (BAG) IMPLANT
BAG ZIPLOCK 12X15 (MISCELLANEOUS) ×2 IMPLANT
BLADE SAGITTAL 13X1.27X60 (BLADE) ×2 IMPLANT
BLADE SAW SGTL 18X1.27X75 (BLADE) ×2 IMPLANT
BLADE SURG 15 STRL LF DISP TIS (BLADE) ×1 IMPLANT
BLADE SURG 15 STRL SS (BLADE) ×2
BLADE SURG SZ10 CARB STEEL (BLADE) ×4 IMPLANT
BNDG ELASTIC 6X5.8 VLCR STR LF (GAUZE/BANDAGES/DRESSINGS) ×2 IMPLANT
BOWL SMART MIX CTS (DISPOSABLE) ×2 IMPLANT
CEMENT BONE SIMPLEX SPEEDSET (Cement) ×4 IMPLANT
CLSR STERI-STRIP ANTIMIC 1/2X4 (GAUZE/BANDAGES/DRESSINGS) ×1 IMPLANT
COVER SURGICAL LIGHT HANDLE (MISCELLANEOUS) ×2 IMPLANT
CUFF TOURN SGL QUICK 34 (TOURNIQUET CUFF) ×2
CUFF TRNQT CYL 34X4.125X (TOURNIQUET CUFF) ×1 IMPLANT
DRAPE INCISE IOBAN 66X45 STRL (DRAPES) ×4 IMPLANT
DRAPE U-SHAPE 47X51 STRL (DRAPES) ×2 IMPLANT
DRSG AQUACEL AG ADV 3.5X10 (GAUZE/BANDAGES/DRESSINGS) ×2 IMPLANT
DRSG TELFA 4X10 ISLAND STR (GAUZE/BANDAGES/DRESSINGS) ×1 IMPLANT
DURAPREP 26ML APPLICATOR (WOUND CARE) ×4 IMPLANT
ELECT REM PT RETURN 15FT ADLT (MISCELLANEOUS) ×2 IMPLANT
FEMUR  CMT CCR STD SZ6 L KNEE (Knees) ×2 IMPLANT
FEMUR CMT CCR STD SZ6 L KNEE (Knees) ×1 IMPLANT
FEMUR CMTD CCR STD SZ6 L KNEE (Knees) IMPLANT
GLOVE SURG ENC TEXT LTX SZ7.5 (GLOVE) IMPLANT
GLOVE SURG ENC TEXT LTX SZ8 (GLOVE) ×6 IMPLANT
GLOVE SURG UNDER POLY LF SZ7.5 (GLOVE) IMPLANT
GLOVE SURG UNDER POLY LF SZ8.5 (GLOVE) ×4 IMPLANT
GOWN STRL REUS W/ TWL XL LVL3 (GOWN DISPOSABLE) ×2 IMPLANT
GOWN STRL REUS W/TWL XL LVL3 (GOWN DISPOSABLE) ×4
HANDPIECE INTERPULSE COAX TIP (DISPOSABLE) ×2
HOLDER FOLEY CATH W/STRAP (MISCELLANEOUS) ×2 IMPLANT
HOOD PEEL AWAY FLYTE STAYCOOL (MISCELLANEOUS) ×6 IMPLANT
KIT TURNOVER KIT A (KITS) IMPLANT
MANIFOLD NEPTUNE II (INSTRUMENTS) ×2 IMPLANT
NDL HYPO 21X1.5 SAFETY (NEEDLE) ×1 IMPLANT
NEEDLE HYPO 21X1.5 SAFETY (NEEDLE) ×2 IMPLANT
NS IRRIG 1000ML POUR BTL (IV SOLUTION) ×2 IMPLANT
PACK TOTAL KNEE CUSTOM (KITS) ×2 IMPLANT
PROTECTOR NERVE ULNAR (MISCELLANEOUS) ×2 IMPLANT
SET HNDPC FAN SPRY TIP SCT (DISPOSABLE) ×1 IMPLANT
SPIKE FLUID TRANSFER (MISCELLANEOUS) ×4 IMPLANT
STEM POLY PAT PLY 32M KNEE (Knees) ×1 IMPLANT
STEM TIBIA 5 DEG SZ C L KNEE (Knees) IMPLANT
STRIP CLOSURE SKIN 1/2X4 (GAUZE/BANDAGES/DRESSINGS) ×2 IMPLANT
SUT BONE WAX W31G (SUTURE) ×2 IMPLANT
SUT MNCRL AB 3-0 PS2 18 (SUTURE) ×2 IMPLANT
SUT STRATAFIX 0 PDS 27 VIOLET (SUTURE) ×2
SUT STRATAFIX PDS+ 0 24IN (SUTURE) ×2 IMPLANT
SUT VIC AB 1 CT1 36 (SUTURE) ×2 IMPLANT
SUTURE STRATFX 0 PDS 27 VIOLET (SUTURE) ×1 IMPLANT
SYR 30ML LL (SYRINGE) ×4 IMPLANT
TIBIA STEM 5 DEG SZ C L KNEE (Knees) ×2 IMPLANT
TRAY FOLEY MTR SLVR 16FR STAT (SET/KITS/TRAYS/PACK) ×2 IMPLANT
WATER STERILE IRR 1000ML POUR (IV SOLUTION) ×4 IMPLANT
WRAP KNEE MAXI GEL POST OP (GAUZE/BANDAGES/DRESSINGS) ×2 IMPLANT

## 2021-10-18 NOTE — Evaluation (Signed)
Physical Therapy Evaluation ?Patient Details ?Name: Mary Garcia ?MRN: [id number] ?DOB: 1945/10/31 ?Today's Date: 10/18/2021 ? ?History of Present Illness ? pt s/p L TKA 10/18/2021 woth PMH of L LE fracture with rod, that they removed during this TKA surgery.  ?Clinical Impression ? Pt tolerated session well today. Walked with RW into the bathroom and performed stair training with no rail han held assit and cane and also with 1 rail to simulate her inside steps. Educate completed on exercise program and progression, mobilizing , positioning and knee precautions. Pt tolerated well and ready for DC from PT standpoint.    ?   ? ?Recommendations for follow up therapy are one component of a multi-disciplinary discharge planning process, led by the attending physician.  Recommendations may be updated based on patient status, additional functional criteria and insurance authorization. ? ?Follow Up Recommendations Follow physician's recommendations for discharge plan and follow up therapies ? ?  ?Assistance Recommended at Discharge Intermittent Supervision/Assistance  ?Patient can return home with the following ? A little help with walking and/or transfers;A little help with bathing/dressing/bathroom ? ?  ?Equipment Recommendations None recommended by PT  ?Recommendations for Other Services ?    ?  ?Functional Status Assessment Patient has had a recent decline in their functional status and demonstrates the ability to make significant improvements in function in a reasonable and predictable amount of time.  ? ?  ?Precautions / Restrictions Precautions ?Precautions: Knee ?Restrictions ?Weight Bearing Restrictions: No  ? ?  ? ?Mobility ? Bed Mobility ?Overal bed mobility: Modified Independent ?  ?  ?  ?  ?  ?  ?  ?  ? ?Transfers ?Overall transfer level: Needs assistance ?Equipment used: Rolling walker (2 wheels) ?Transfers: Sit to/from Stand ?Sit to Stand: Min guard ?  ?  ?  ?  ?  ?General transfer comment: cues for safety with rW  and hand placement ?  ? ?Ambulation/Gait ?Ambulation/Gait assistance: Min guard ?Gait Distance (Feet): 40 Feet ?Assistive device: Rolling walker (2 wheels) ?Gait Pattern/deviations: Step-to pattern ?  ?  ?  ?General Gait Details: good control and step to pattern today , will be able to progress to step through pattern in next few days. ? ?Stairs ?Stairs: Yes ?Stairs assistance: Min assist ?Stair Management: No rails, Step to pattern ?Number of Stairs: 3 ?General stair comments: used rail frist time, with min A on the other side, then used no rail with cane and hand held assist on other side. Educate pt's husband with hand held assist hold ? ?Wheelchair Mobility ?  ? ?Modified Rankin (Stroke Patients Only) ?  ? ?  ? ?Balance   ?  ?  ?  ?  ?  ?  ?  ?  ?  ?  ?  ?  ?  ?  ?  ?  ?  ?  ?   ? ? ? ?Pertinent Vitals/Pain Pain Assessment ?Pain Assessment: 0-10 ?Pain Score: 3  ?Pain Location: L lateral aspect of knee and some posterior lateral ?Pain Descriptors / Indicators: Sore, Aching ?Pain Intervention(s): Limited activity within patient's tolerance, Monitored during session  ? ? ?Home Living Family/patient expects to be discharged to:: Private residence ?Living Arrangements: Spouse/significant other ?Available Help at Discharge: Family ?Type of Home: House ?Home Access: Stairs to enter ?Entrance Stairs-Rails: None ?Entrance Stairs-Number of Steps: 3 ?Alternate Level Stairs-Number of Steps: 10 ?Home Layout: Two level;Bed/bath upstairs ?Home Equipment: Conservation officer, nature (2 wheels);Cane - single point;Crutches;BSC/3in1 ?   ?  ?Prior Function Prior  Level of Function : Independent/Modified Independent ?  ?  ?  ?  ?  ?  ?Mobility Comments: Pt really enjoys being active, wants to get back to playing pickle ball and hiking, and yoga ?  ?  ? ? ?Hand Dominance  ?   ? ?  ?Extremity/Trunk Assessment  ?   ?  ? ?Lower Extremity Assessment ?Lower Extremity Assessment: LLE deficits/detail ?LLE Deficits / Details: 0-80 supine , able to  perform SLR independently 5 x , good quad set ?  ? ?   ?Communication  ? Communication: No difficulties  ?Cognition Arousal/Alertness: Awake/alert ?Behavior During Therapy: [facility] for tasks assessed/performed ?Overall Cognitive Status: Within Functional Limits for tasks assessed ?  ?  ?  ?  ?  ?  ?  ?  ?  ?  ?  ?  ?  ?  ?  ?  ?  ?  ?  ? ?  ?General Comments   ? ?  ?Exercises Total Joint Exercises ?Ankle Circles/Pumps: AROM, Left, 10 reps, Supine ?Quad Sets: AROM, Supine, Left, 10 reps ?Short Arc Quad: AROM, Supine, Left, 10 reps ?Heel Slides: AAROM, Supine, Left, 10 reps ?Hip ABduction/ADduction: AAROM, Supine, Left, 10 reps ?Straight Leg Raises: AAROM, Supine, Left, 10 reps ?Goniometric ROM: 0-80 grossly  ? ?Assessment/Plan  ?  ?PT Assessment Patient does not need any further PT services  ?PT Problem List   ? ?   ?  ?PT Treatment Interventions     ? ?PT Goals (Current goals can be found in the Care Plan section)  ?Acute Rehab PT Goals ?Patient Stated Goal: I want to get home and then get better so I can get back to hiking, yoga and pickle ball ?PT Goal Formulation: All assessment and education complete, DC therapy ?Time For Goal Achievement: 10/18/21 ? ?  ?Frequency   ?  ? ? ?Co-evaluation   ?  ?  ?  ?  ? ? ?  ?AM-PAC PT "6 Clicks" Mobility  ?Outcome Measure Help needed turning from your back to your side while in a flat bed without using bedrails?: A Little ?Help needed moving from lying on your back to sitting on the side of a flat bed without using bedrails?: A Little ?Help needed moving to and from a bed to a chair (including a wheelchair)?: A Little ?Help needed standing up from a chair using your arms (e.g., wheelchair or bedside chair)?: A Little ?Help needed to walk in hospital room?: A Little ?Help needed climbing 3-5 steps with a railing? : A Little ?6 Click Score: 18 ? ?  ?End of Session Equipment Utilized During Treatment: Gait belt ?Activity Tolerance: Patient tolerated treatment well ?Patient left: in  bed;with call bell/phone within reach;with nursing/sitter in room ?Nurse Communication: Mobility status ?PT Visit Diagnosis: Other abnormalities of gait and mobility (R26.89) ?  ? ?Time: 1815-1900 ?PT Time Calculation (min) (ACUTE ONLY): 45 min ? ? ?Charges:   PT Evaluation ?$PT Eval Low Complexity: 1 Low ?PT Treatments ?$Gait Training: 8-22 mins ?$Therapeutic Exercise: 8-22 mins ?  ?   ? ? ?Gatha Mayer, PT, MPT ?Acute Rehabilitation Services ?Office: [phone] ?Pager: [phone] ?10/18/2021 ? ? Clide Dales ?10/18/2021, 8:12 PM ?

## 2021-10-18 NOTE — H&P (Signed)
Mary Garcia ?MRN:  [id number] ?DOB/SEX:  [birth year]/female ? ?CHIEF COMPLAINT:  Painful left Knee ? ?HISTORY: ?Patient is a 76 y.o. female presented with a history of pain in the left knee. Onset of symptoms was gradual starting a few years ago with gradually worsening course since that time. Patient has been treated conservatively with over-the-counter NSAIDs and activity modification. Patient currently rates pain in the knee at a few out of 10 with activity. There is pain at night. ? ?PAST MEDICAL HISTORY: ?Patient Active Problem List  ? Diagnosis Date Noted  ? Seasonal allergic rhinitis due to pollen 05/17/2021  ? Mild persistent asthma without complication 38/88/2800  ? Other allergic rhinitis 03/27/2019  ? DDD (degenerative disc disease), cervical 09/15/2017  ? Depression 02/02/2016  ? Arthropathy of right shoulder 02/02/2016  ? Insomnia 04/13/2011  ? Osteoporosis 04/13/2011  ? Biliary dyskinesia 04/13/2011  ? Osteoarthritis 04/13/2011  ? History of migraine headaches 04/13/2011  ? Vitamin D deficiency 04/13/2011  ? Anxiety 04/13/2011  ? Irritable bowel syndrome 04/13/2011  ? ?Past Medical History:  ?Diagnosis Date  ? Anxiety   ? Arthritis   ? Asthma   ? Bronchitis, acute   ? ?Past Surgical History:  ?Procedure Laterality Date  ? BREAST ENHANCEMENT SURGERY    ? CATARACT EXTRACTION W/ INTRAOCULAR LENS IMPLANT Bilateral   ? FACELIFT    ? local  ? ROTATOR CUFF REPAIR Right   ? TIBIA FRACTURE SURGERY  2004  ? left leg x3, tibia and fibula  ? TUMOR EXCISION  2003  ? right muscle back, benign  ?  ? ?MEDICATIONS:   ?No medications prior to admission.  ? ? ?ALLERGIES:  No Known Allergies ? ?REVIEW OF SYSTEMS:  ?A comprehensive review of systems was negative except for: Musculoskeletal: positive for arthralgias and bone pain ? ? ?FAMILY HISTORY:   ?Family History  ?Problem Relation Age of Onset  ? Asthma Mother   ? Brain cancer Father   ? Colon cancer Neg Hx   ? ? ?SOCIAL HISTORY:   ?Social History  ? ?Tobacco Use  ?  Smoking status: Former  ?  Packs/day: 0.40  ?  Years: 15.00  ?  Pack years: 6.00  ?  Types: Cigarettes  ?  Quit date: 08/15/1978  ?  Years since quitting: 43.2  ? Smokeless tobacco: Never  ? Tobacco comments:  ?  smokes 5 cigs daily when smoking  ?Substance Use Topics  ? Alcohol use: Yes  ?  Alcohol/week: 2.0 standard drinks  ?  Types: 2 Glasses of wine per week  ?  Comment: 2-3 times a week  ? ?  ?EXAMINATION: ? ?Vital signs in last 24 hours: ?  ? ?There were no vitals taken for this visit. ? ?General Appearance:    Alert, cooperative, no distress, appears stated age  ?Head:    Normocephalic, without obvious abnormality, atraumatic  ?Eyes:    PERRL, conjunctiva/corneas clear, EOM's intact, fundi  ?  benign, both eyes  ?Ears:    Normal TM's and external ear canals, both ears  ?Nose:   Nares normal, septum midline, mucosa normal, no drainage    or sinus tenderness  ?Throat:   Lips, mucosa, and tongue normal; teeth and gums normal  ?Neck:   Supple, symmetrical, trachea midline, no adenopathy;  ?  thyroid:  no enlargement/tenderness/nodules; no carotid ?  bruit or JVD  ?Back:     Symmetric, no curvature, ROM normal, no CVA tenderness  ?Lungs:     Clear  to auscultation bilaterally, respirations unlabored  ?Chest Wall:    No tenderness or deformity  ? Heart:    Regular rate and rhythm, S1 and S2 normal, no murmur, rub   or gallop  ?Breast Exam:    No tenderness, masses, or nipple abnormality  ?Abdomen:     Soft, non-tender, bowel sounds active all four quadrants,  ?  no masses, no organomegaly  ?Genitalia:    Normal female without lesion, discharge or tenderness  ?Rectal:    Normal tone, no masses or tenderness; ?  guaiac negative stool  ?Extremities:   Extremities normal, atraumatic, no cyanosis or edema  ?Pulses:   2+ and symmetric all extremities  ?Skin:   Skin color, texture, turgor normal, no rashes or lesions  ?Lymph nodes:   Cervical, supraclavicular, and axillary nodes normal  ?Neurologic:   CNII-XII intact, normal  strength, sensation and reflexes  ?  throughout  ? ? ?Musculoskeletal:  ROM 0-120, Ligaments intact,  ?Imaging Review ?Plain radiographs demonstrate severe degenerative joint disease of the left knee. The overall alignment is neutral. The bone quality appears to be good for age and reported activity level. ? ?Assessment/Plan: ?Primary osteoarthritis, left knee  ? ?The patient history, physical examination and imaging studies are consistent with advanced degenerative joint disease of the left knee. The patient has failed conservative treatment.  The clearance notes were reviewed.  After discussion with the patient it was felt that Total Knee Replacement with hardware removal was indicated. The procedure,  risks, and benefits of total knee arthroplasty were presented and reviewed. The risks including but not limited to aseptic loosening, infection, blood clots, vascular injury, stiffness, patella tracking problems complications among others were discussed. The patient acknowledged the explanation, agreed to proceed with the plan. ? ?Preoperative templating of the joint replacement has been completed, documented, and submitted to the Operating Room personnel in order to optimize intra-operative equipment management.  ? ? ?Patient's anticipated LOS is less than 2 midnights, meeting these requirements: ?- Lives within 1 hour of care ?- Has a competent adult at home to recover with post-op recover ?- NO history of ? - Chronic pain requiring opiods ? - Diabetes ? - Coronary Artery Disease ? - Heart failure ? - Heart attack ? - Stroke ? - DVT/VTE ? - Cardiac arrhythmia ? - Respiratory Failure/COPD ? - Renal failure ? - Anemia ? - Advanced Liver disease ? ?  ? ?Donia Ast ?10/18/2021, 7:06 AM  ?

## 2021-10-18 NOTE — Anesthesia Procedure Notes (Signed)
Spinal ? ?Patient location during procedure: OR ?Start time: 10/18/2021 1:49 PM ?End time: 10/18/2021 1:53 PM ?Reason for block: surgical anesthesia ?Staffing ?Performed: anesthesiologist  ?Anesthesiologist: Barnet Glasgow, MD ?Preanesthetic Checklist ?Completed: patient identified, IV checked, risks and benefits discussed, surgical consent, monitors and equipment checked, pre-op evaluation and timeout performed ?Spinal Block ?Patient position: sitting ?Prep: DuraPrep and site prepped and draped ?Patient monitoring: heart rate, cardiac monitor, continuous pulse ox and blood pressure ?Approach: midline ?Location: L3-4 ?Injection technique: single-shot ?Needle ?Needle type: Pencan  ?Needle gauge: 24 G ?Needle length: 10 cm ?Needle insertion depth: 6 cm ?Assessment ?Sensory level: T4 ?Events: CSF return ?Additional Notes ? 1 Attempt (s). Pt tolerated procedure well. ? ? ? ?

## 2021-10-18 NOTE — Anesthesia Procedure Notes (Addendum)
Anesthesia Regional Block: Adductor canal block  ? ?Pre-Anesthetic Checklist: , timeout performed,  Correct Patient, Correct Site, Correct Laterality,  Correct Procedure, Correct Position, site marked,  Risks and benefits discussed,  Surgical consent,  Pre-op evaluation,  At surgeon's request and post-op pain management ? ?Laterality: Lower and Left ? ?Prep: chloraprep     ?  ?Needles:  ?Injection technique: Single-shot ? ?Needle Type: Echogenic Needle   ? ? ?Needle Length: 9cm  ?Needle Gauge: 22  ? ? ? ?Additional Needles: ? ? ?Procedures:,,,, ultrasound used (permanent image in chart),,    ?Narrative:  ?Start time: 10/18/2021 11:31 AM ?End time: 10/18/2021 11:41 AM ?Injection made incrementally with aspirations every 5 mL. ? ?Performed by: Personally  ?Anesthesiologist: Barnet Glasgow, MD ? ?Additional Notes: ?Block assessed prior to surgery. Pt tolerated procedure well. ? ? ? ? ?

## 2021-10-18 NOTE — Op Note (Signed)
TOTAL KNEE REPLACEMENT OPERATIVE NOTE: ? ?10/18/2021 ? ?3:36 PM ? ?PATIENT:  Mary Garcia  76 y.o. female ? ?PRE-OPERATIVE DIAGNOSIS:  Osteoarthritis of left knee M17.12 ? ?POST-OPERATIVE DIAGNOSIS:  Osteoarthritis of left knee ? ?PROCEDURE:  Procedure(s): ?TOTAL KNEE ARTHROPLASTY ? ?SURGEON:  Surgeon(s): ?Vickey Huger, MD ? ?PHYSICIAN ASSISTANT: Carlyon Shadow, PA-C  ? ?ANESTHESIA:   spinal ? ?SPECIMEN: None ? ?COUNTS:  Correct ? ?TOURNIQUET:   ?Total Tourniquet Time Documented: ?Thigh (Left) - 53 minutes ?Total: Thigh (Left) - 53 minutes ? ? ?DICTATION: ? ?Indication for procedure:   ? ?The patient is a 76 y.o. female who has failed conservative treatment for Osteoarthritis of left knee M17.12.  Informed consent was obtained prior to anesthesia. The risks versus benefits of the operation were explain and in a way the patient can, and did, understand.  ? ? ? ?Description of procedure:   ?  ?The patient was taken to the operating room and placed under anesthesia.  The patient was positioned in the usual fashion taking care that all body parts were adequately padded and/or protected.  A tourniquet was applied and the leg prepped and draped in the usual sterile fashion.  The extremity was exsanguinated with the esmarch and tourniquet inflated to 300 mmHg.  Pre-operative range of motion was normal.   ? ?A midline incision approximately 6-7 inches long was made with a #10 blade.  A new blade was used to make a parapatellar arthrotomy going 2-3 cm into the quadriceps tendon, over the patella, and alongside the medial aspect of the patellar tendon.  A synovectomy was then performed with the #10 blade and forceps. I then elevated the deep MCL off the medial tibial metaphysis subperiosteally around to the semimembranosus attachment.   ? ?I everted the patella and used calipers to measure patellar thickness.  I used the reamer to ream down to appropriate thickness to recreate the native thickness.  I then removed excess bone with  the rongeur and sagittal saw.  I used the appropriately sized template and drilled the three lug holes.  I then put the trial in place and measured the thickness with the calipers to ensure recreation of the native thickness.  The trial was then removed and the patella subluxed and the knee brought into flexion. ? ?A homan retractor was place to retract and protect the patella and lateral structures.  A Z-retractor was place medially to protect the medial structures.  The extra-medullary alignment system was used to make cut the tibial articular surface perpendicular to the anamotic axis of the tibia and in 3 degrees of posterior slope.  The cut surface and alignment jig was removed.  And the existing tibial nail was identified. The proximal screw was removed with a hex screwdriver, the distal screw was previously removed in another operation. The removal jig was attached to the head and backslapped out with no issues. I then used the intramedullary alignment guide to make a 6 valgus cut on the distal femur. ? ?I then marked out the epicondylar axis on the distal femur.  I then used the anterior referencing sizer and measured the femur to be a size 6.  The 4-In-1 cutting block was screwed into place in external rotation matching the posterior condylar angle, making our cuts perpendicular to the epicondylar axis.  Anterior, posterior and chamfer cuts were made with the sagittal saw.  The cutting block and cut pieces were removed. ? ?A lamina spreader was placed in 90 degrees of flexion.  The ACL, PCL, menisci, and posterior condylar osteophytes were removed.  A 11 mm spacer blocked was found to offer good flexion and extension gap balance after minimal in degree releasing.   The scoop retractor was then placed and the femoral finishing block was pinned in place.  The small sagittal saw was used as well as the lug drill to finish the femur.  The block and cut surfaces were removed and the medullary canal hole filled  with autograft bone from the cut pieces. ? ?The tibia was delivered forward in deep flexion and external rotation.  A size C tray was selected and pinned into place centered on the medial 1/3 of the tibial tubercle.  The reamer and keel was used to prepare the tibia through the tray.   ? ?I then trialed with the size 6 femur, size C tibia, a 11 mm insert and the 32 patella.  I had excellent flexion/extension gap balance, excellent patella tracking.  Flexion was full and beyond 120 degrees; extension was zero.  These components were chosen and the staff opened them to me on the back table while the knee was lavaged copiously and the cement mixed. ? ?The soft tissue was infiltrated with 60cc of exparel 1.3% through a 21 gauge needle. ? ?I cemented in the components and removed all excess cement.  The polyethylene tibial component was snapped into place and the knee placed in extension while cement was hardening.  The capsule was infilltrated with a 60cc exparel/marcaine/saline mixture.   Once the cement was hard, the tourniquet was let down.  Hemostasis was obtained.  The arthrotomy was closed using a #1 stratofix running suture.  The deep soft tissues were closed with #0 vicryls and the subcuticular layer closed with #2-0 vicryl.  The skin was reapproximated and closed with 3.0 Monocryl.  The wound was covered with steristrips, aquacel dressing, and a TED stocking.   The patient was then awakened, extubated, and taken to the recovery room in stable condition. ? ?BLOOD LOSS:  300cc ?COMPLICATIONS:  None. ? ?PLAN OF CARE: Discharge to home after PACU ? ?PATIENT DISPOSITION:  PACU - hemodynamically stable. ?  ?Delay start of Pharmacological VTE agent (>24hrs) due to surgical blood loss or risk of bleeding:  yes ? ?Please fax a copy of this op note to my office at [phone] (please only include page 1 and 2 of the Case Information op note) ? ? ? ? ? ? ? ? ? ? ? ?  ?

## 2021-10-18 NOTE — Progress Notes (Signed)
AssistedDr. Houser with left, ultrasound guided, adductor canal block. Side rails up, monitors on throughout procedure. See vital signs in flow sheet. Tolerated Procedure well.  

## 2021-10-18 NOTE — Anesthesia Postprocedure Evaluation (Signed)
Anesthesia Post Note ? ?Patient: Mary Garcia ? ?Procedure(s) Performed: TOTAL KNEE ARTHROPLASTY (Left: Knee) ? ?  ? ?Patient location during evaluation: Nursing Unit ?Anesthesia Type: Spinal ?Level of consciousness: oriented and awake and alert ?Pain management: pain level controlled ?Vital Signs Assessment: post-procedure vital signs reviewed and stable ?Respiratory status: spontaneous breathing and respiratory function stable ?Cardiovascular status: blood pressure returned to baseline and stable ?Postop Assessment: no headache, no backache, no apparent nausea or vomiting and patient able to bend at knees ?Anesthetic complications: no ? ? ?No notable events documented. ? ?Last Vitals:  ?Vitals:  ? 10/18/21 1905 10/18/21 1945  ?BP: 134/82 134/82  ?Pulse:  77  ?Resp:  16  ?Temp:  (!) 36.4 ?C  ?SpO2:  96%  ?  ?Last Pain:  ?Vitals:  ? 10/18/21 1945  ?TempSrc:   ?PainSc: 3   ? ? ?  ?  ?  ?  ?  ?  ? ?Barnet Glasgow ? ? ? ? ?

## 2021-10-18 NOTE — Transfer of Care (Signed)
Immediate Anesthesia Transfer of Care Note ? ?Patient: Mary Garcia ? ?Procedure(s) Performed: TOTAL KNEE ARTHROPLASTY (Left: Knee) ? ?Patient Location: PACU ? ?Anesthesia Type:Spinal ? ?Level of Consciousness: awake, alert  and oriented ? ?Airway & Oxygen Therapy: Patient Spontanous Breathing and Patient connected to face mask oxygen ? ?Post-op Assessment: Report given to RN and Post -op Vital signs reviewed and stable ? ?Post vital signs: Reviewed and stable ? ?Last Vitals:  ?Vitals Value Taken Time  ?BP 94/59 10/18/21 1530  ?Temp    ?Pulse 85 10/18/21 1531  ?Resp 17 10/18/21 1531  ?SpO2 100 % 10/18/21 1531  ?Vitals shown include unvalidated device data. ? ?Last Pain:  ?Vitals:  ? 10/18/21 1135  ?TempSrc:   ?PainSc: 0-No pain  ?   ? ?  ? ?Complications: No notable events documented. ?

## 2021-10-18 NOTE — Progress Notes (Signed)
Orthopedic Tech Progress Note ?Patient Details:  ?Mary Garcia ?1945-10-24 ?[id number] ? ?CPM Left Knee ?CPM Left Knee: On ?Left Knee Flexion (Degrees): 90 ?Left Knee Extension (Degrees): 0 ? ?Post Interventions ?Patient Tolerated: Well ?Instructions Provided: Care of device, Adjustment of device ? ?[name] ?10/18/2021, 3:56 PM ? ?

## 2021-10-19 ENCOUNTER — Encounter (HOSPITAL_COMMUNITY): Payer: Self-pay | Admitting: Orthopedic Surgery

## 2021-10-28 DIAGNOSIS — M25462 Effusion, left knee: Secondary | ICD-10-CM | POA: Diagnosis not present

## 2021-10-28 DIAGNOSIS — Z96652 Presence of left artificial knee joint: Secondary | ICD-10-CM | POA: Diagnosis not present

## 2021-10-28 NOTE — Therapy (Signed)
?OUTPATIENT PHYSICAL THERAPY TREATMENT NOTE ? ? ?Patient Name: Mary Garcia ?MRN: [id number] ?DOB:11-Nov-1945, 76 y.o., female ?Today's Date: 11/02/2021 ? ?PCP: Elby Showers, MD ?REFERRING PROVIDER: Elby Showers, MD ? ? PT End of Session - 11/02/21 1745   ? ? Visit Number 2   ? Number of Visits 16   ? Date for PT Re-Evaluation 12/09/21   ? Authorization Type BCBS MCR 6th visit FOTO  10 th visit FOTO   ? Progress Note Due on Visit 10   ? PT Start Time 1501   ? PT Stop Time 8469   ? PT Time Calculation (min) 46 min   ? Activity Tolerance Patient tolerated treatment well   ? Behavior During Therapy [facility] for tasks assessed/performed   ? ?  ?  ? ?  ? ? ?Past Medical History:  ?Diagnosis Date  ? Anxiety   ? Arthritis   ? Asthma   ? Bronchitis, acute   ? ?Past Surgical History:  ?Procedure Laterality Date  ? BREAST ENHANCEMENT SURGERY    ? CATARACT EXTRACTION W/ INTRAOCULAR LENS IMPLANT Bilateral   ? FACELIFT    ? local  ? ROTATOR CUFF REPAIR Right   ? TIBIA FRACTURE SURGERY  2004  ? left leg x3, tibia and fibula  ? TOTAL KNEE ARTHROPLASTY Left 10/18/2021  ? Procedure: TOTAL KNEE ARTHROPLASTY;  Surgeon: Vickey Huger, MD;  Location: [facility];  Service: Orthopedics;  Laterality: Left;  ? TUMOR EXCISION  2003  ? right muscle back, benign  ? ?Patient Active Problem List  ? Diagnosis Date Noted  ? Seasonal allergic rhinitis due to pollen 05/17/2021  ? Mild persistent asthma without complication 62/95/2841  ? Other allergic rhinitis 03/27/2019  ? DDD (degenerative disc disease), cervical 09/15/2017  ? Depression 02/02/2016  ? Arthropathy of right shoulder 02/02/2016  ? Insomnia 04/13/2011  ? Osteoporosis 04/13/2011  ? Biliary dyskinesia 04/13/2011  ? Osteoarthritis 04/13/2011  ? History of migraine headaches 04/13/2011  ? Vitamin D deficiency 04/13/2011  ? Anxiety 04/13/2011  ? Irritable bowel syndrome 04/13/2011  ? ? ?REFERRING DIAG: S/P LT TKA,TIBIA ROD REMOVAL  ? ?THERAPY DIAG:  ?Chronic pain of left knee ? ?Difficulty in walking,  not elsewhere classified ? ?Muscle weakness (generalized) ? ?Localized edema ? ?PERTINENT HISTORY: 19 years ago I fell and broke the Left lower leg and  rod/plates. Bronchitis, asthma OA ? ?PRECAUTIONS: TKA Left  ? ?SUBJECTIVE: I had a great surgery.  I have been using a CPM at home and I am about to discontinue. I did take me medication before coming today for PT  ? ? ?PAIN:  ?Are you having pain? Yes  ?NPRS scale: 0/10 initial clinic visit post surgery and then 3/10 after RX ?Pain location: Left knee ?Pain orientation: Left, Medial, and Posterior  ?PAIN TYPE: constant pain aching , throbbing ?Pain description: constant  ?Aggravating factors: moving it walking , steps , standing for more than 10 minutes ?Relieving factors: nothing/medication  ? ?OBJECTIVE:  ?  ?DIAGNOSTIC FINDINGS: IMPRESSION: 05/17/21 ?1. Nondepressed, nondisplaced subchondral insufficiency fracture of ?the medial tibial plateau with severe surrounding bone marrow edema. ?2.  Tricompartmental cartilage abnormalities as described above. ?3. Radial tear of the posterior horn of the medial meniscus with ?peripheral meniscal extrusion. ?  ?PATIENT SURVEYS:  ?FOTO 33% to 57% predicted ?  ?COGNITION: ?         Overall cognitive status: Within functional limits for tasks assessed              ?          ?  SENSATION: ?         Light touch: Appears intact ?         Stereognosis: Appears intact ?         Hot/Cold: Appears intact ?         Proprioception: Appears intact ?  ?MUSCLE LENGTH: ?Hamstrings:  WNL ?  ?  ?POSTURE:  ? [place] female with average build, forward head and rounded shld, valgus Left knee ?  ?PALPATION: ?TTP over medial aspect of Left knee ?  ?LE AROM/PROM: ?  ?A/PROM Right A/P ?10/07/2021 Left ?10/07/2021  Left  ?11-02-21  ?Hip flexion 110 110   ?Hip extension       ?Hip abduction       ?Hip adduction       ?Hip internal rotation       ?Hip external rotation       ?Knee flexion 136/140 130/138 painful 105/112  ?Knee extension 0 4 9 A/ PROM 5  from extension deficit  ?Ankle dorsiflexion       ?Ankle plantarflexion       ?Ankle inversion       ?Ankle eversion       ? (Blank rows = not tested) ?  ?LE MMT: ?  ?MMT Right ?10/07/2021 Left ?10/07/2021 Left ?11-02-21  ?Hip flexion '5 5 5  '$ ?Hip extension '5 5 5  '$ ?Hip abduction 5 4 4-  ?Hip adduction       ?Hip internal rotation       ?Hip external rotation       ?Knee flexion 5 4 4-  ?Knee extension 5 4+ pain 4-  ?Ankle dorsiflexion '5 5 5  '$ ?Ankle plantarflexion '5 5 5  '$ ?Ankle inversion       ?Ankle eversion       ? (Blank rows = not tested) ?  ?LOWER EXTREMITY SPECIAL TESTS:  ?NT ?  ?FUNCTIONAL TESTS:  ?5 times sit to stand: 16.3 sec ?Squat - wt shift to R only able to squat ot 60 degree flexion ?  ?GAIT:  Pre - surgery for TKA ?Distance walked: 150 ?Assistive device utilized: None ?Level of assistance: Complete Independence ?Comments: Pt with valgus L knee and antalgic gait to unweight painful L LE ?  ?  ?  ?TODAY'S TREATMENT: ? ?[facility] Adult PT Treatment:                                                DATE: 11-02-21 ?Therapeutic Exercise: ?Heel slides 2 x 10 ?Sit to stand 1 x 10  ?Knee ext kicks L with R side knee flex bend ( kicking reciprocal inhibition) ?Step ups 1 x 5 with L LE ?Sitting knee flexion L with assisted R LE ?Manual Therapy: ?STW /retrograde massage L knee/ L LE to inguinal line ?Patellar mobs inf/sup ? ? Gait -working with lofstrand walking sticks adjusted and educated with correct gait sequence and correct execution for steps. ?Modalities: ?Ice pack after RX to left knee in sitting ?Self Care: ?Use of heat and cryotherapy as well PRICE, positioning, self measure for increasing flexion on chair at home ? ? ?EVAL- Self care for initial HEP for phase 1 of TKA,  How to measure AROM at home for pt. Cryotherapy, Timeline for TKA healing/expectations for early phase post surgery ?  ?  ?PATIENT EDUCATION:  ?Education details:  POC, FOTO report,  How to measure AROM at home for pt. Cryotherapy, Timeline for TKA,  PRICE ?healing/expectations for early phase post surgery, intial HEP ?Person educated: Patient ?Education method: Explanation, Demonstration, Tactile cues, Verbal cues, and Handouts ?Education comprehension: verbalized understanding, returned demonstration, and needs further education ?  ?  ?HOME EXERCISE PROGRAM: ?Access Code: [email or web address] ?URL: [email or web address] ?Date: 10/07/2021 ?Prepared by: Voncille Lo ?  ?Exercises ?Supine Knee Extension Stretch on Towel Roll - 1 x daily - 7 x weekly - 3 sets - 10 reps ?Supine Heel Slide with Strap - 1 x daily - 7 x weekly - 3 sets - 10 reps ?Supine Short Arc Quad - 1 x daily - 7 x weekly - 3 sets - 10 reps ?Hamstring Stretch with Strap - 1 x daily - 7 x weekly - 3 sets - 10 reps ?Long Arc Quad - 1 x daily - 7 x weekly - 3 sets - 10 reps ?Mini Squat - 1 x daily - 7 x weekly - 3 sets - 10 reps ?  ?  ?ASSESSMENT: ?  ?CLINICAL IMPRESSION: ?Patient is a 76 y.o. female  returns today from L TKA 10/18/2021 with PMH of L LE fracture with rod, that they removed during this TKA surgery on 10-18-21 by Dr Ronnie Derby.  She is doing well post surgery with A/PROM of left knee ext 9/5 degrees with extension deficit and knee flexion 105/112.  Pt is able to walk with bil lofstrand walking sticks adjusted for her gait.  Pt pain initially is 0/10 but 3/10 at end of session after manual therapy to end of range.  Pt has been using CPM machine but she has extension deficit and will probably improve quickly since CPM will be discontinued since she is now in OPPT.  ?  ?  ?OBJECTIVE IMPAIRMENTS Abnormal gait, decreased activity tolerance, decreased balance, decreased knowledge of condition, decreased knowledge of use of DME, decreased mobility, decreased ROM, decreased strength, pain, and edema .  ?  ?ACTIVITY LIMITATIONS cleaning, driving, meal prep, laundry, and shopping.  ?  ?PERSONAL FACTORS 19 years ago I fell and broke the Left lower leg and  rod/plates. Bronchitis, asthma OA are also  affecting patient's functional outcome.  ?  ?  ?REHAB POTENTIAL: Excellent ?  ?CLINICAL DECISION MAKING: Stable/uncomplicated ?  ?EVALUATION COMPLEXITY: Low ?  ?  ?GOALS: ?Goals reviewed with patient?

## 2021-11-02 ENCOUNTER — Ambulatory Visit: Payer: Medicare Other | Attending: Orthopedic Surgery | Admitting: Physical Therapy

## 2021-11-02 ENCOUNTER — Encounter: Payer: Self-pay | Admitting: Physical Therapy

## 2021-11-02 ENCOUNTER — Encounter: Payer: Medicare Other | Admitting: Physical Therapy

## 2021-11-02 ENCOUNTER — Other Ambulatory Visit: Payer: Self-pay

## 2021-11-02 DIAGNOSIS — G8929 Other chronic pain: Secondary | ICD-10-CM

## 2021-11-02 DIAGNOSIS — R600 Localized edema: Secondary | ICD-10-CM | POA: Diagnosis not present

## 2021-11-02 DIAGNOSIS — M6281 Muscle weakness (generalized): Secondary | ICD-10-CM

## 2021-11-02 DIAGNOSIS — M25562 Pain in left knee: Secondary | ICD-10-CM | POA: Diagnosis not present

## 2021-11-02 DIAGNOSIS — R262 Difficulty in walking, not elsewhere classified: Secondary | ICD-10-CM | POA: Diagnosis not present

## 2021-11-02 NOTE — Patient Instructions (Signed)
Cryotherapy- may use over knee or any place that is swelling ?Cryotherapy means treatment with cold. Ice or gel packs can be used to reduce both pain and swelling. Ice is the most helpful within the first 24 to 48 hours after an injury or flare-up from overusing a muscle or joint. Sprains, strains, spasms, burning pain, shooting pain, and aches can all be eased with ice. Ice can also be used when recovering from surgery. Ice is effective, has very few side effects, and is safe for most people to use. ?PRECAUTIONS  ?Ice is not a safe treatment option for people with: ?Raynaud phenomenon. This is a condition affecting small blood vessels in the extremities. Exposure to cold may cause your problems to return. ?Cold hypersensitivity. There are many forms of cold hypersensitivity, including: ?Cold urticaria. Red, itchy hives appear on the skin when the tissues begin to warm after being iced. ?Cold erythema. This is a red, itchy rash caused by exposure to cold. ?Cold hemoglobinuria. Red blood cells break down when the tissues begin to warm after being iced. The hemoglobin that carry oxygen are passed into the urine because they cannot combine with blood proteins fast enough. ?Numbness or altered sensitivity in the area being iced. ?If you have any of the following conditions, do not use ice until you have discussed cryotherapy with your caregiver: ?Heart conditions, such as arrhythmia, angina, or chronic heart disease. ?High blood pressure. ?Healing wounds or open skin in the area being iced. ?Current infections. ?Rheumatoid arthritis. ?Poor circulation. ?Diabetes. ?Ice slows the blood flow in the region it is applied. This is beneficial when trying to stop inflamed tissues from spreading irritating chemicals to surrounding tissues. However, if you expose your skin to cold temperatures for too long or without the proper protection, you can damage your skin or nerves. Watch for signs of skin damage due to cold. ?HOME CARE  INSTRUCTIONS ?Follow these tips to use ice and cold packs safely. ?Place a dry or damp towel between the ice and skin. A damp towel will cool the skin more quickly, so you may need to shorten the time that the ice is used. ?For a more rapid response, add gentle compression to the ice. ?Ice for no more than 10 to 20 minutes at a time. The bonier the area you are icing, the less time it will take to get the benefits of ice. ?Check your skin after 5 minutes to make sure there are no signs of a poor response to cold or skin damage. ?Rest 20 minutes or more between uses. ?Once your skin is numb, you can end your treatment. You can test numbness by very lightly touching your skin. The touch should be so light that you do not see the skin dimple from the pressure of your fingertip. When using ice, most people will feel these normal sensations in this order: cold, burning, aching, and numbness. ?Do not use ice on someone who cannot communicate their responses to pain, such as small children or people with dementia. ?HOW TO MAKE AN ICE PACK ?Ice packs are the most common way to use ice therapy. Other methods include ice massage, ice baths, and cryosprays. Muscle creams that cause a cold, tingly feeling do not offer the same benefits that ice offers and should not be used as a substitute unless recommended by your caregiver. ?To make an ice pack, do one of the following: ?Place crushed ice or a bag of frozen vegetables in a sealable plastic bag. Squeeze out  the excess air. Place this bag inside another plastic bag. Slide the bag into a pillowcase or place a damp towel between your skin and the bag. ?Mix 3 parts water with 1 part rubbing alcohol. Freeze the mixture in a sealable plastic bag. When you remove the mixture from the freezer, it will be slushy. Squeeze out the excess air. Place this bag inside another plastic bag. Slide the bag into a pillowcase or place a damp towel between your skin and the bag. ?SEEK MEDICAL CARE  IF: ?You develop white spots on your skin. This may give the skin a blotchy (mottled) appearance. ?Your skin turns blue or pale. ?Your skin becomes waxy or hard. ?Your swelling gets worse. ?MAKE SURE YOU:  ?Understand these instructions. ?Will watch your condition. ?Will get help right away if you are not doing well or get worse. ?Document Released: 03/28/2011 Document Revised: 12/16/2013 Document Reviewed: 03/28/2011 ?ExitCare? Patient Information ?2015 [employer]. This information is not intended to replace advice given to you by your health care provider. Make sure you discuss any questions you have with your health care provider.  ? ?Heat Therapy  Mrs. Bonine only use on back of hamstrings or on quad muscles not directly over knee ?Heat therapy can help make painful, stiff muscles and joints feel better. Do not use heat on new injuries. Wait at least 48 hours after an injury to use heat. Do not use heat when you have aches or pains right after an activity. If you still have pain 3 hours after stopping the activity, then you may use heat. ?HOME CARE ?Wet heat pack ?Soak a clean towel in warm water. Squeeze out the extra water. ?Put the warm, wet towel in a plastic bag. ?Place a thin, dry towel between your skin and the bag. ?Put the heat pack on the area for 5 minutes, and check your skin. Your skin may be pink, but it should not be red. ?Leave the heat pack on the area for 15 to 30 minutes. ?Repeat this every 2 to 4 hours while awake. Do not use heat while you are sleeping. ?Warm water bath ?Fill a tub with warm water. ?Place the affected body part in the tub. ?Soak the area for 20 to 40 minutes. ?Repeat as needed. ?Hot water bottle ?Fill the water bottle half full with hot water. ?Press out the extra air. Close the cap tightly. ?Place a dry towel between your skin and the bottle.heat ?Put the bottle on the area for 5 minutes, and check your skin. Your skin may be pink, but it should not be red. ?Leave the  bottle on the area for 15 to 30 minutes. ?Repeat this every 2 to 4 hours while awake. ?Electric heating pad ?Place a dry towel between your skin and the heating pad. ?Set the heating pad on low heat. ?Put the heating pad on the area for 10 minutes, and check your skin. Your skin may be pink, but it should not be red. ?Leave the heating pad on the area for 20 to 40 minutes. ?Repeat this every 2 to 4 hours while awake. ?Do not lie on the heating pad. ?Do not fall asleep while using the heating pad. ?Do not use the heating pad near water. ?GET HELP RIGHT AWAY IF: ?You get blisters or red skin. ?Your skin is puffy (swollen), or you lose feeling (numbness) in the affected area. ?You have any new problems. ?Your problems are getting worse. ?You have any questions or concerns. ?If  you have any problems, stop using heat therapy until you see your doctor. ?MAKE SURE YOU: ?Understand these instructions. ?Will watch your condition. ?Will get help right away if you are not doing well or get worse. ?Voncille Lo, PT, [place] ?Certified Exercise Expert for the Aging Adult  ?11/02/21 3:32 PM ?Phone: [phone] ?Fax: [phone]  ?Document Released: 10/24/2011 Document Reviewed: 09/24/2013 ?ExitCare? Patient Information ?2015 [employer]. This information is not intended to replace advice given to you by your health care provider. Make sure you discuss any questions you have with your health care provider.  ?

## 2021-11-03 ENCOUNTER — Encounter: Payer: Self-pay | Admitting: Physical Therapy

## 2021-11-03 ENCOUNTER — Ambulatory Visit: Payer: Medicare Other | Admitting: Physical Therapy

## 2021-11-03 DIAGNOSIS — R600 Localized edema: Secondary | ICD-10-CM | POA: Diagnosis not present

## 2021-11-03 DIAGNOSIS — R262 Difficulty in walking, not elsewhere classified: Secondary | ICD-10-CM | POA: Diagnosis not present

## 2021-11-03 DIAGNOSIS — M25562 Pain in left knee: Secondary | ICD-10-CM | POA: Diagnosis not present

## 2021-11-03 DIAGNOSIS — G8929 Other chronic pain: Secondary | ICD-10-CM | POA: Diagnosis not present

## 2021-11-03 DIAGNOSIS — M6281 Muscle weakness (generalized): Secondary | ICD-10-CM

## 2021-11-03 NOTE — Therapy (Signed)
?OUTPATIENT PHYSICAL THERAPY TREATMENT NOTE ? ? ?Patient Name: Mary Garcia ?MRN: [id number] ?DOB:12-May-1946, 76 y.o., female ?Today's Date: 11/03/2021 ? ?PCP: Elby Showers, MD ?REFERRING PROVIDER: Vickey Huger, MD ? ? PT End of Session - 11/03/21 1502   ? ? Visit Number 3   ? Number of Visits 16   ? Date for PT Re-Evaluation 12/09/21   ? Authorization Type BCBS MCR 6th visit FOTO  10 th visit FOTO   ? Progress Note Due on Visit 10   ? PT Start Time 1501   ? PT Stop Time 2542   ? PT Time Calculation (min) 47 min   ? Activity Tolerance Patient tolerated treatment well   ? Behavior During Therapy [facility] for tasks assessed/performed   ? ?  ?  ? ?  ? ? ? ?Past Medical History:  ?Diagnosis Date  ? Anxiety   ? Arthritis   ? Asthma   ? Bronchitis, acute   ? ?Past Surgical History:  ?Procedure Laterality Date  ? BREAST ENHANCEMENT SURGERY    ? CATARACT EXTRACTION W/ INTRAOCULAR LENS IMPLANT Bilateral   ? FACELIFT    ? local  ? ROTATOR CUFF REPAIR Right   ? TIBIA FRACTURE SURGERY  2004  ? left leg x3, tibia and fibula  ? TOTAL KNEE ARTHROPLASTY Left 10/18/2021  ? Procedure: TOTAL KNEE ARTHROPLASTY;  Surgeon: Vickey Huger, MD;  Location: [facility];  Service: Orthopedics;  Laterality: Left;  ? TUMOR EXCISION  2003  ? right muscle back, benign  ? ?Patient Active Problem List  ? Diagnosis Date Noted  ? Seasonal allergic rhinitis due to pollen 05/17/2021  ? Mild persistent asthma without complication 70/62/3762  ? Other allergic rhinitis 03/27/2019  ? DDD (degenerative disc disease), cervical 09/15/2017  ? Depression 02/02/2016  ? Arthropathy of right shoulder 02/02/2016  ? Insomnia 04/13/2011  ? Osteoporosis 04/13/2011  ? Biliary dyskinesia 04/13/2011  ? Osteoarthritis 04/13/2011  ? History of migraine headaches 04/13/2011  ? Vitamin D deficiency 04/13/2011  ? Anxiety 04/13/2011  ? Irritable bowel syndrome 04/13/2011  ? ? ?REFERRING DIAG: S/P LT TKA,TIBIA ROD REMOVAL  ? ?THERAPY DIAG:  ?Chronic pain of left knee ? ?Difficulty in walking,  not elsewhere classified ? ?Muscle weakness (generalized) ? ?Localized edema ? ?PERTINENT HISTORY: 19 years ago I fell and broke the Left lower leg and  rod/plates. Bronchitis, asthma OA ? ?PRECAUTIONS: TKA Left  ?SUBJECTIVE: "i've been doing pretty good today, I've done my exercises and was in CPM which my ROM  ? ?PAIN:  ?Are you having pain? Yes: NPRS scale: 2/10 ?Pain location: medial joint line ?Pain description: aching  ?Aggravating factors: bending the knee ?Relieving factors: heat on the thigh, ice on the knee, CBD cream ? ? ?OBJECTIVE:  ?  ?DIAGNOSTIC FINDINGS: IMPRESSION: 05/17/21 ?1. Nondepressed, nondisplaced subchondral insufficiency fracture of ?the medial tibial plateau with severe surrounding bone marrow edema. ?2.  Tricompartmental cartilage abnormalities as described above. ?3. Radial tear of the posterior horn of the medial meniscus with ?peripheral meniscal extrusion. ?  ?PATIENT SURVEYS:  ?FOTO 33% to 57% predicted ?  ?COGNITION: ?         Overall cognitive status: Within functional limits for tasks assessed              ?          ?SENSATION: ?         Light touch: Appears intact ?         Stereognosis: Appears intact ?  Hot/Cold: Appears intact ?         Proprioception: Appears intact ?  ?MUSCLE LENGTH: ?Hamstrings:  WNL ?  ?  ?POSTURE:  ? [place] female with average build, forward head and rounded shld, valgus Left knee ?  ?PALPATION: ?TTP over medial aspect of Left knee ?  ?LE AROM/PROM: ?  ?A/PROM Right A/P ?10/07/2021 Left ?10/07/2021  Left  ?11-02-21  ?Hip flexion 110 110   ?Hip extension       ?Hip abduction       ?Hip adduction       ?Hip internal rotation       ?Hip external rotation       ?Knee flexion 136/140 130/138 painful 105/112  ?Knee extension 0 4 9 A/ PROM 5 from extension deficit  ?Ankle dorsiflexion       ?Ankle plantarflexion       ?Ankle inversion       ?Ankle eversion       ? (Blank rows = not tested) ?  ?LE MMT: ?  ?MMT Right ?10/07/2021 Left ?10/07/2021 Left ?11-02-21   ?Hip flexion '5 5 5  '$ ?Hip extension '5 5 5  '$ ?Hip abduction 5 4 4-  ?Hip adduction       ?Hip internal rotation       ?Hip external rotation       ?Knee flexion 5 4 4-  ?Knee extension 5 4+ pain 4-  ?Ankle dorsiflexion '5 5 5  '$ ?Ankle plantarflexion '5 5 5  '$ ?Ankle inversion       ?Ankle eversion       ? (Blank rows = not tested) ?  ?LOWER EXTREMITY SPECIAL TESTS:  ?NT ?  ?FUNCTIONAL TESTS:  ?5 times sit to stand: 16.3 sec ?11/03/2021 16.8 sec ?Squat - wt shift to R only able to squat ot 60 degree flexion ?  ?GAIT:  Pre - surgery for TKA ?Distance walked: 150 ?Assistive device utilized: None ?Level of assistance: Complete Independence ?Comments: Pt with valgus L knee and antalgic gait to unweight painful L LE ?  ?  ?  ?TODAY'S TREATMENT: ? ?[facility] Adult PT Treatment:                                                DATE: 11/03/2021 ?Before treatment AROM  9 - 96 degrees ?After treatement AROM 8 - 103 ? ?Therapeutic Exercise: ?Recumbent bike full revolutions L1 x 6 min, lowering seat at 3 min to promote knee flexion ?Quat set with towel in popliteal space to promote quad activation 1 x 10 holding 5 second ?SLR 1 x 20 with sustained quad set ?Heel slides 1 x 10  ?Manual Therapy: ?Patellar inferior/ superior mobs grade III ?PA grade III combined with tibial ER to promote terminal knee extension ?AP grade III 5 x 30 bouts with active flexion between bouts to promote flexion ?Neuromuscular re-ed: ?Rocking forward/ backward with LLE advanced forward combined with ankle DF with posterior rocking to promote involuntary quad activation 1 x 20 ?  Therapeutic Activity: ?Sit to stand with cues to scoot forward and utilize rocking forward with focus of nose over toes, keeping L/R foot aligned and avoiding pushing LLE forward.  performed 1 x 5 with use of hands pushing up for the table. 1 x 12 with reaching forward to promote trunk leaning  ? ? ?[facility] Adult PT Treatment:  DATE:  11-02-21 ?Therapeutic Exercise: ?Heel slides 2 x 10 ?Sit to stand 1 x 10  ?Knee ext kicks L with R side knee flex bend ( kicking reciprocal inhibition) ?Step ups 1 x 5 with L LE ?Sitting knee flexion L with assisted R LE ?Manual Therapy: ?STW /retrograde massage L knee/ L LE to inguinal line ?Patellar mobs inf/sup ? ?Gait -working with lofstrand walking sticks adjusted and educated with correct gait sequence and correct execution for steps. ?Modalities: ?Ice pack after RX to left knee in sitting ?Self Care: ?Use of heat and cryotherapy as well PRICE, positioning, self measure for increasing flexion on chair at home ? ? ?EVAL- Self care for initial HEP for phase 1 of TKA,  How to measure AROM at home for pt. Cryotherapy, Timeline for TKA healing/expectations for early phase post surgery ?  ?  ?PATIENT EDUCATION:  ?Education details:  POC, FOTO report, How to measure AROM at home for pt. Cryotherapy, Timeline for TKA, PRICE ?healing/expectations for early phase post surgery, intial HEP ?Person educated: Patient ?Education method: Explanation, Demonstration, Tactile cues, Verbal cues, and Handouts ?Education comprehension: verbalized understanding, returned demonstration, and needs further education ?  ?  ?HOME EXERCISE PROGRAM: ?Access Code: [email or web address] ?URL: [email or web address] ?Date: 10/07/2021 ?Prepared by: Voncille Lo ?  ?Exercises ?Supine Knee Extension Stretch on Towel Roll - 1 x daily - 7 x weekly - 3 sets - 10 reps ?Supine Heel Slide with Strap - 1 x daily - 7 x weekly - 3 sets - 10 reps ?Supine Short Arc Quad - 1 x daily - 7 x weekly - 3 sets - 10 reps ?Hamstring Stretch with Strap - 1 x daily - 7 x weekly - 3 sets - 10 reps ?Long Arc Quad - 1 x daily - 7 x weekly - 3 sets - 10 reps ?Mini Squat - 1 x daily - 7 x weekly - 3 sets - 10 reps ?  ?  ?ASSESSMENT: ?  ?CLINICAL IMPRESSION: ? pt arrives to session reporting pain today is at 2/10 and has been consistent with her HEP. She has been using  her CPM but notes it is being picked up tomorrow. She started session with AROM  total arc of  9 - 96 degrees today, and finished with 8 - 103. Continued addressing knee ROM via patellar and tibiofemoral mobs to maximize

## 2021-11-03 NOTE — Therapy (Signed)
?OUTPATIENT PHYSICAL THERAPY TREATMENT NOTE ? ? ?Patient Name: Mary Garcia ?MRN: [id number] ?DOB:14-Sep-1945, 76 y.o., female ?Today's Date: 11/04/2021 ? ?PCP: Elby Showers, MD ?REFERRING PROVIDER: Vickey Huger, MD ? ? PT End of Session - 11/04/21 1653   ? ? Visit Number 4   ? Number of Visits 16   ? Date for PT Re-Evaluation 12/09/21   ? Authorization Type BCBS MCR 6th visit FOTO  10 th visit FOTO   ? PT Start Time 1503   ? PT Stop Time 7035   ? PT Time Calculation (min) 45 min   ? Activity Tolerance Patient tolerated treatment well   ? Behavior During Therapy [facility] for tasks assessed/performed   ? ?  ?  ? ?  ? ? ? ? ?Past Medical History:  ?Diagnosis Date  ? Anxiety   ? Arthritis   ? Asthma   ? Bronchitis, acute   ? ?Past Surgical History:  ?Procedure Laterality Date  ? BREAST ENHANCEMENT SURGERY    ? CATARACT EXTRACTION W/ INTRAOCULAR LENS IMPLANT Bilateral   ? FACELIFT    ? local  ? ROTATOR CUFF REPAIR Right   ? TIBIA FRACTURE SURGERY  2004  ? left leg x3, tibia and fibula  ? TOTAL KNEE ARTHROPLASTY Left 10/18/2021  ? Procedure: TOTAL KNEE ARTHROPLASTY;  Surgeon: Vickey Huger, MD;  Location: [facility];  Service: Orthopedics;  Laterality: Left;  ? TUMOR EXCISION  2003  ? right muscle back, benign  ? ?Patient Active Problem List  ? Diagnosis Date Noted  ? Seasonal allergic rhinitis due to pollen 05/17/2021  ? Mild persistent asthma without complication 00/93/8182  ? Other allergic rhinitis 03/27/2019  ? DDD (degenerative disc disease), cervical 09/15/2017  ? Depression 02/02/2016  ? Arthropathy of right shoulder 02/02/2016  ? Insomnia 04/13/2011  ? Osteoporosis 04/13/2011  ? Biliary dyskinesia 04/13/2011  ? Osteoarthritis 04/13/2011  ? History of migraine headaches 04/13/2011  ? Vitamin D deficiency 04/13/2011  ? Anxiety 04/13/2011  ? Irritable bowel syndrome 04/13/2011  ? ? ?REFERRING DIAG: S/P LT TKA,TIBIA ROD REMOVAL  ? ?THERAPY DIAG:  ?Chronic pain of left knee ? ?Difficulty in walking, not elsewhere  classified ? ?Muscle weakness (generalized) ? ?Localized edema ? ?PERTINENT HISTORY: 19 years ago I fell and broke the Left lower leg and  rod/plates. Bronchitis, asthma OA ? ?PRECAUTIONS: TKA Left  ?SUBJECTIVE: CPM is gone   ? ?PAIN:  ?Are you having pain? Yes: NPRS scale: 2/10 ?Pain location: medial joint line ?Pain description: aching  ?Aggravating factors: bending the knee ?Relieving factors: heat on the thigh, ice on the knee, CBD cream ? ? ?OBJECTIVE:  ?  ?DIAGNOSTIC FINDINGS: IMPRESSION: 05/17/21 ?1. Nondepressed, nondisplaced subchondral insufficiency fracture of ?the medial tibial plateau with severe surrounding bone marrow edema. ?2.  Tricompartmental cartilage abnormalities as described above. ?3. Radial tear of the posterior horn of the medial meniscus with ?peripheral meniscal extrusion. ?  ?PATIENT SURVEYS:  ?FOTO 33% to 57% predicted ?  ?COGNITION: ?         Overall cognitive status: Within functional limits for tasks assessed              ?          ?SENSATION: ?         Light touch: Appears intact ?         Stereognosis: Appears intact ?         Hot/Cold: Appears intact ?  Proprioception: Appears intact ?  ?MUSCLE LENGTH: ?Hamstrings:  WNL ?  ?  ?POSTURE:  ? [place] female with average build, forward head and rounded shld, valgus Left knee ?  ?PALPATION: ?TTP over medial aspect of Left knee ?  ?LE AROM/PROM: ?  ?A/PROM Right A/P ?10/07/2021 Left ?10/07/2021  Left  ?11-02-21  ?Hip flexion 110 110   ?Hip extension       ?Hip abduction       ?Hip adduction       ?Hip internal rotation       ?Hip external rotation       ?Knee flexion 136/140 130/138 painful 105/112  ?Knee extension 0 4 9 A/ PROM 5 from extension deficit  ?Ankle dorsiflexion       ?Ankle plantarflexion       ?Ankle inversion       ?Ankle eversion       ? (Blank rows = not tested) ?  ?LE MMT: ?  ?MMT Right ?10/07/2021 Left ?10/07/2021 Left ?11-02-21  ?Hip flexion '5 5 5  '$ ?Hip extension '5 5 5  '$ ?Hip abduction 5 4 4-  ?Hip adduction        ?Hip internal rotation       ?Hip external rotation       ?Knee flexion 5 4 4-  ?Knee extension 5 4+ pain 4-  ?Ankle dorsiflexion '5 5 5  '$ ?Ankle plantarflexion '5 5 5  '$ ?Ankle inversion       ?Ankle eversion       ? (Blank rows = not tested) ?  ?LOWER EXTREMITY SPECIAL TESTS:  ?NT ?  ?FUNCTIONAL TESTS:  ?5 times sit to stand: 16.3 sec ?11/03/2021 16.8 sec ?Squat - wt shift to R only able to squat ot 60 degree flexion ?  ?GAIT:  Pre - surgery for TKA ?Distance walked: 150 ?Assistive device utilized: None ?Level of assistance: Complete Independence ?Comments: Pt with valgus L knee and antalgic gait to unweight painful L LE ?  ?  ?  ?TODAY'S TREATMENT: ?[facility] Adult PT Treatment:                                                DATE: 11-04-21 ?Before treatment AROM  9 - 98 degrees ?After treatement AROM 5 -109 ?Therapeutic Exercise: ?Retro stepping 3 lengths of counter ?UE wall slide to promote involuntary quad activation 1 x 20 ?UE wall slide to promote involuntary quad activation with opposite hip extension ?Sit to stand with VC for Wt shifting and keeping wt shift symmetrical. 3 x 30 ? ?Manual Therapy: ?Patellar inferior/ superior mobs grade III ?PA grade III combined with tibial ER to promote terminal knee extension ?AP grade III 5 x 30 bouts with active flexion between bouts to promote flexion ? ?Self Care    ? Utilize walker for muscle flexion pump to  warm up for flexion activities  ? Explanation of self monitoring for increasing extension and  flexion throughout day ?Use of cold /heat for pain post PT RX  ? ? ? ?[facility] Adult PT Treatment:                                                DATE: 11/03/2021 ?Before treatment AROM  9 - 96 degrees ?After treatement AROM  8-103 ? ?Therapeutic Exercise: ?Recumbent bike full revolutions L1 x 6 min, lowering seat at 3 min to promote knee flexion ?Quat set with towel in popliteal space to promote quad activation 1 x 10 holding 5 second ?SLR 1 x 20 with sustained quad set ?Heel slides  1 x 10  ?Manual Therapy: ?Patellar inferior/ superior mobs grade III ?PA grade III combined with tibial ER to promote terminal knee extension ?AP grade III 5 x 30 bouts with active flexion between bouts to promote flexion ?Neuromuscular re-ed: ?Rocking forward/ backward with LLE advanced forward combined with ankle DF with posterior rocking to promote involuntary quad activation 1 x 20 ?  Therapeutic Activity: ?Sit to stand with cues to scoot forward and utilize rocking forward with focus of nose over toes, keeping L/R foot aligned and avoiding pushing LLE forward.  performed 1 x 5 with use of hands pushing up for the table. 1 x 12 with reaching forward to promote trunk leaning  ? ? ?[facility] Adult PT Treatment:                                                DATE: 11-02-21 ?Therapeutic Exercise: ?Heel slides 2 x 10 ?Sit to stand 1 x 10  ?Knee ext kicks L with R side knee flex bend ( kicking reciprocal inhibition) ?Step ups 1 x 5 with L LE ?Sitting knee flexion L with assisted R LE ?Manual Therapy: ?STW /retrograde massage L knee/ L LE to inguinal line ?Patellar mobs inf/sup ? ?Gait -working with lofstrand walking sticks adjusted and educated with correct gait sequence and correct execution for steps. ?Modalities: ?Ice pack after RX to left knee in sitting ?Self Care: ?Use of heat and cryotherapy as well PRICE, positioning, self measure for increasing flexion on chair at home ? ? ?EVAL- Self care for initial HEP for phase 1 of TKA,  How to measure AROM at home for pt. Cryotherapy, Timeline for TKA healing/expectations for early phase post surgery ?  ?  ?PATIENT EDUCATION:  ?Education details:  POC, FOTO report, How to measure AROM at home for pt. Cryotherapy, Timeline for TKA, PRICE ?healing/expectations for early phase post surgery, intial HEP ?Person educated: Patient ?Education method: Explanation, Demonstration, Tactile cues, Verbal cues, and Handouts ?Education comprehension: verbalized understanding, returned  demonstration, and needs further education ?  ?  ?HOME EXERCISE PROGRAM: ?Access Code: [email or web address] ?URL: [email or web address] ?Date: 10/07/2021 ?Prepared by: Voncille Lo ?  ?Exercises ?Supine Knee Extension Stre

## 2021-11-04 ENCOUNTER — Ambulatory Visit: Payer: Medicare Other | Admitting: Physical Therapy

## 2021-11-04 ENCOUNTER — Other Ambulatory Visit: Payer: Self-pay

## 2021-11-04 DIAGNOSIS — R600 Localized edema: Secondary | ICD-10-CM | POA: Diagnosis not present

## 2021-11-04 DIAGNOSIS — M25562 Pain in left knee: Secondary | ICD-10-CM | POA: Diagnosis not present

## 2021-11-04 DIAGNOSIS — M6281 Muscle weakness (generalized): Secondary | ICD-10-CM

## 2021-11-04 DIAGNOSIS — G8929 Other chronic pain: Secondary | ICD-10-CM | POA: Diagnosis not present

## 2021-11-04 DIAGNOSIS — R262 Difficulty in walking, not elsewhere classified: Secondary | ICD-10-CM

## 2021-11-04 NOTE — Patient Instructions (Addendum)
? ? ?  Self mob of Knee extension using strap as shown in clinic with quad set and elevated ankle on bolster/towels. ? ?Voncille Lo, PT, [place] ?Certified Exercise Expert for the Aging Adult  ?11/04/21 3:33 PM ?Phone: [phone] ?Fax: [phone]  ? ?

## 2021-11-05 ENCOUNTER — Other Ambulatory Visit: Payer: Medicare Other

## 2021-11-05 DIAGNOSIS — E7800 Pure hypercholesterolemia, unspecified: Secondary | ICD-10-CM

## 2021-11-05 DIAGNOSIS — F411 Generalized anxiety disorder: Secondary | ICD-10-CM

## 2021-11-05 DIAGNOSIS — E559 Vitamin D deficiency, unspecified: Secondary | ICD-10-CM

## 2021-11-08 ENCOUNTER — Other Ambulatory Visit: Payer: Self-pay

## 2021-11-08 ENCOUNTER — Ambulatory Visit (INDEPENDENT_AMBULATORY_CARE_PROVIDER_SITE_OTHER): Payer: Medicare Other | Admitting: Internal Medicine

## 2021-11-08 ENCOUNTER — Encounter: Payer: Self-pay | Admitting: Internal Medicine

## 2021-11-08 VITALS — BP 118/82 | HR 108 | Temp 98.2°F | Ht 63.0 in | Wt 138.5 lb

## 2021-11-08 DIAGNOSIS — Z0000 Encounter for general adult medical examination without abnormal findings: Secondary | ICD-10-CM | POA: Diagnosis not present

## 2021-11-08 DIAGNOSIS — G4700 Insomnia, unspecified: Secondary | ICD-10-CM | POA: Diagnosis not present

## 2021-11-08 DIAGNOSIS — Z8709 Personal history of other diseases of the respiratory system: Secondary | ICD-10-CM | POA: Diagnosis not present

## 2021-11-08 DIAGNOSIS — E7800 Pure hypercholesterolemia, unspecified: Secondary | ICD-10-CM | POA: Diagnosis not present

## 2021-11-08 DIAGNOSIS — M810 Age-related osteoporosis without current pathological fracture: Secondary | ICD-10-CM | POA: Diagnosis not present

## 2021-11-08 DIAGNOSIS — Z96652 Presence of left artificial knee joint: Secondary | ICD-10-CM

## 2021-11-08 DIAGNOSIS — Z9882 Breast implant status: Secondary | ICD-10-CM

## 2021-11-08 LAB — POCT URINALYSIS DIPSTICK
Bilirubin, UA: NEGATIVE
Blood, UA: NEGATIVE
Glucose, UA: NEGATIVE
Ketones, UA: NEGATIVE
Leukocytes, UA: NEGATIVE
Nitrite, UA: NEGATIVE
Protein, UA: NEGATIVE
Spec Grav, UA: 1.025 (ref 1.010–1.025)
Urobilinogen, UA: 0.2 E.U./dL
pH, UA: 6.5 (ref 5.0–8.0)

## 2021-11-08 MED ORDER — ALPRAZOLAM 0.5 MG PO TABS: ORAL_TABLET | ORAL | 0 refills | Status: DC

## 2021-11-08 NOTE — Patient Instructions (Addendum)
Take Xanax 0.5 mg 1/2 to 1 tablet up to twice daily as needed for anxiety.  Continue physical therapy.  I am pleased with her progress.  Your lipids are elevated but that is likely due to inactivity.  Please make an appointment to come back in 6 months and have lipid panel checked with office visit. ?
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
## 2021-11-09 ENCOUNTER — Ambulatory Visit: Payer: Medicare Other | Admitting: Physical Therapy

## 2021-11-09 ENCOUNTER — Encounter: Payer: Self-pay | Admitting: Physical Therapy

## 2021-11-09 ENCOUNTER — Encounter: Payer: Medicare Other | Admitting: Physical Therapy

## 2021-11-09 DIAGNOSIS — R600 Localized edema: Secondary | ICD-10-CM | POA: Diagnosis not present

## 2021-11-09 DIAGNOSIS — M6281 Muscle weakness (generalized): Secondary | ICD-10-CM

## 2021-11-09 DIAGNOSIS — G8929 Other chronic pain: Secondary | ICD-10-CM

## 2021-11-09 DIAGNOSIS — R262 Difficulty in walking, not elsewhere classified: Secondary | ICD-10-CM

## 2021-11-09 DIAGNOSIS — M25562 Pain in left knee: Secondary | ICD-10-CM | POA: Diagnosis not present

## 2021-11-09 NOTE — Patient Instructions (Signed)
Access Code: [email or web address] ?URL: [email or web address] ?Date: 11/09/2021 ?Prepared by: Voncille Lo ? ?Program Notes ?Retro walkHamstring walk at counterIsometric hamstring hold for 45 sec x 5  ? ? ?Voncille Lo, PT, [facility] ?Certified Exercise Expert for the Aging Adult  ?11/09/21 9:12 AM ?Phone: [phone] ?Fax: [phone]  ?

## 2021-11-09 NOTE — Therapy (Signed)
?OUTPATIENT PHYSICAL THERAPY TREATMENT NOTE ? ? ?Patient Name: Mary Garcia ?MRN: [id number] ?DOB:1946-02-18, 76 y.o., female ?Today's Date: 11/09/2021 ? ?PCP: Elby Showers, MD ?REFERRING PROVIDER: Elby Showers, MD ? ? PT End of Session - 11/09/21 6962   ? ? Visit Number 5   ? Number of Visits 16   ? Date for PT Re-Evaluation 12/09/21   ? Authorization Type BCBS MCR 6th visit FOTO  10 th visit FOTO   ? Progress Note Due on Visit 10   ? PT Start Time [phone]   ? PT Stop Time 0931   ? PT Time Calculation (min) 44 min   ? Activity Tolerance Patient tolerated treatment well   ? Behavior During Therapy [facility] for tasks assessed/performed   ? ?  ?  ? ?  ? ? ? ? ? ?Past Medical History:  ?Diagnosis Date  ? Anxiety   ? Arthritis   ? Asthma   ? Bronchitis, acute   ? ?Past Surgical History:  ?Procedure Laterality Date  ? BREAST ENHANCEMENT SURGERY    ? CATARACT EXTRACTION W/ INTRAOCULAR LENS IMPLANT Bilateral   ? FACELIFT    ? local  ? ROTATOR CUFF REPAIR Right   ? TIBIA FRACTURE SURGERY  2004  ? left leg x3, tibia and fibula  ? TOTAL KNEE ARTHROPLASTY Left 10/18/2021  ? Procedure: TOTAL KNEE ARTHROPLASTY;  Surgeon: Vickey Huger, MD;  Location: [facility];  Service: Orthopedics;  Laterality: Left;  ? TUMOR EXCISION  2003  ? right muscle back, benign  ? ?Patient Active Problem List  ? Diagnosis Date Noted  ? Seasonal allergic rhinitis due to pollen 05/17/2021  ? Mild persistent asthma without complication 41/32/4401  ? Other allergic rhinitis 03/27/2019  ? DDD (degenerative disc disease), cervical 09/15/2017  ? Depression 02/02/2016  ? Arthropathy of right shoulder 02/02/2016  ? Insomnia 04/13/2011  ? Osteoporosis 04/13/2011  ? Biliary dyskinesia 04/13/2011  ? Osteoarthritis 04/13/2011  ? History of migraine headaches 04/13/2011  ? Vitamin D deficiency 04/13/2011  ? Anxiety 04/13/2011  ? Irritable bowel syndrome 04/13/2011  ? ? ?REFERRING DIAG: S/P LT TKA,TIBIA ROD REMOVAL  ? ?THERAPY DIAG:  ?Chronic pain of left knee ? ?Muscle weakness  (generalized) ? ?Difficulty in walking, not elsewhere classified ? ?Localized edema ? ?PERTINENT HISTORY: 19 years ago I fell and broke the Left lower leg and  rod/plates. Bronchitis, asthma OA ? ?PRECAUTIONS: TKA Left  ?SUBJECTIVE: CPM is gone   ? ?PAIN: I was a 7/10 when I first woke up but now a 2/10 since I have been walking around with a walking stick but I didn't bring to clinic today. I am having pain at night. It feels better when I twist my hip ?Are you having pain? Yes: NPRS scale: 2/10 ?Pain location: medial joint line ?Pain description: aching  ?Aggravating factors: bending the knee ?Relieving factors: heat on the thigh, ice on the knee, CBD cream ? ? ?OBJECTIVE:  ?  ?DIAGNOSTIC FINDINGS: IMPRESSION: 05/17/21 ?1. Nondepressed, nondisplaced subchondral insufficiency fracture of ?the medial tibial plateau with severe surrounding bone marrow edema. ?2.  Tricompartmental cartilage abnormalities as described above. ?3. Radial tear of the posterior horn of the medial meniscus with ?peripheral meniscal extrusion. ?  ?PATIENT SURVEYS:  ?FOTO 33% to 57% predicted ?  ?COGNITION: ?         Overall cognitive status: Within functional limits for tasks assessed              ?          ?  SENSATION: ?         Light touch: Appears intact ?         Stereognosis: Appears intact ?         Hot/Cold: Appears intact ?         Proprioception: Appears intact ?  ?MUSCLE LENGTH: ?Hamstrings:  WNL ?  ?  ?POSTURE:  ? [place] female with average build, forward head and rounded shld, valgus Left knee ?  ?PALPATION: ?TTP over medial aspect of Left knee ?  ?LE AROM/PROM: ?  ?A/PROM Right A/P ?10/07/2021 Left ?10/07/2021  Left  ?11-02-21  ?Hip flexion 110 110   ?Hip extension       ?Hip abduction       ?Hip adduction       ?Hip internal rotation       ?Hip external rotation       ?Knee flexion 136/140 130/138 painful 105/112  ?Knee extension 0 4 9 A/ PROM 5 from extension deficit  ?Ankle dorsiflexion       ?Ankle plantarflexion       ?Ankle  inversion       ?Ankle eversion       ? (Blank rows = not tested) ?  ?LE MMT: ?  ?MMT Right ?10/07/2021 Left ?10/07/2021 Left ?11-02-21  ?Hip flexion '5 5 5  '$ ?Hip extension '5 5 5  '$ ?Hip abduction 5 4 4-  ?Hip adduction       ?Hip internal rotation       ?Hip external rotation       ?Knee flexion 5 4 4-  ?Knee extension 5 4+ pain 4-  ?Ankle dorsiflexion '5 5 5  '$ ?Ankle plantarflexion '5 5 5  '$ ?Ankle inversion       ?Ankle eversion       ? (Blank rows = not tested) ?  ?LOWER EXTREMITY SPECIAL TESTS:  ?NT ?  ?FUNCTIONAL TESTS:  ?5 times sit to stand: 16.3 sec ?11/03/2021 16.8 sec ?Squat - wt shift to R only able to squat ot 60 degree flexion ?  ?GAIT:  Pre - surgery for TKA ?Distance walked: 150 ?Assistive device utilized: None ?Level of assistance: Complete Independence ?Comments: Pt with valgus L knee and antalgic gait to unweight painful L LE ?  ?  ?  ?TODAY'S TREATMENT: ?[facility] Adult PT Treatment:                                                DATE: 11-09-21 ?Before treatment AROM  8 - 116 degrees after bike and hamstring walk ?After treatement AROM 4 -118 ?Therapeutic Exercise: ?Recumbent bike 6 min, moving seat up to increase flexion every  2 minutes Level 2  ?Retro stepping 4 lengths of counter toe/heel walk ?Hamstring walk for 150 feet ?5 times isometric hold hamstring L for 45 sec slightly bent (30 degrees) ?Hamstring stool scoot with L LE pull around gym 80 feet x 2 ?TKE with ball on wall 3 x 30 L ?Sit to stand with VC for Wt shifting and keeping wt shift symmetrical. Holding 15 # KB3 x 30 ?Manual Therapy: ?Patellar inferior/ superior mobs grade III ?PA grade III combined with tibial ER to promote terminal knee extension ? ?Modalities: ?Moist Hot pack on hamstrings prior to Manual  ? ? ?[facility] Adult PT Treatment:  DATE: 11-04-21 ?Before treatment AROM  9 - 98 degrees ?After treatement AROM 5 -109 ?Therapeutic Exercise: ?Retro stepping 3 lengths of counter ?UE wall slide to  promote involuntary quad activation 1 x 20 ?UE wall slide to promote involuntary quad activation with opposite hip extension ?Sit to stand with VC for Wt shifting and keeping wt shift symmetrical. 3 x 30 ? ?Manual Therapy: ?Patellar inferior/ superior mobs grade III ?PA grade III combined with tibial ER to promote terminal knee extension ?AP grade III 5 x 30 bouts with active flexion between bouts to promote flexion ? ?Self Care    ? Utilize walker for muscle flexion pump to  warm up for flexion activities  ? Explanation of self monitoring for increasing extension and  flexion throughout day ?Use of cold /heat for pain post PT RX  ? ? ? ?[facility] Adult PT Treatment:                                                DATE: 11/03/2021 ?Before treatment AROM  9 - 96 degrees ?After treatement AROM  8-103 ? ?Therapeutic Exercise: ?Recumbent bike full revolutions L1 x 6 min, lowering seat at 3 min to promote knee flexion ?Quat set with towel in popliteal space to promote quad activation 1 x 10 holding 5 second ?SLR 1 x 20 with sustained quad set ?Heel slides 1 x 10  ?Manual Therapy: ?Patellar inferior/ superior mobs grade III ?PA grade III combined with tibial ER to promote terminal knee extension ?AP grade III 5 x 30 bouts with active flexion between bouts to promote flexion ?Neuromuscular re-ed: ?Rocking forward/ backward with LLE advanced forward combined with ankle DF with posterior rocking to promote involuntary quad activation 1 x 20 ?  Therapeutic Activity: ?Sit to stand with cues to scoot forward and utilize rocking forward with focus of nose over toes, keeping L/R foot aligned and avoiding pushing LLE forward.  performed 1 x 5 with use of hands pushing up for the table. 1 x 12 with reaching forward to promote trunk leaning  ? ? ?[facility] Adult PT Treatment:                                                DATE: 11-02-21 ?Therapeutic Exercise: ?Heel slides 2 x 10 ?Sit to stand 1 x 10  ?Knee ext kicks L with R side knee flex bend  ( kicking reciprocal inhibition) ?Step ups 1 x 5 with L LE ?Sitting knee flexion L with assisted R LE ?Manual Therapy: ?STW /retrograde massage L knee/ L LE to inguinal line ?Patellar mobs inf/sup ? ?Gait -wo

## 2021-11-10 NOTE — Therapy (Signed)
?OUTPATIENT PHYSICAL THERAPY TREATMENT NOTE ? ? ?Patient Name: Mary Garcia ?MRN: [id number] ?DOB:20-Jun-1946, 76 y.o., female ?Today's Date: 11/11/2021 ? ?PCP: Elby Showers, MD ?REFERRING PROVIDER: Vickey Huger, MD ? ? PT End of Session - 11/11/21 0943   ? ? Visit Number 6   ? Number of Visits 16   ? Date for PT Re-Evaluation 12/09/21   ? Authorization Type BC[employer] MCR 6th visit FOTO  10 th visit FOTO   ? Progress Note Due on Visit 10   ? PT Start Time [phone]   ? PT Stop Time 0930   ? PT Time Calculation (min) 40 min   ? ?  ?  ? ?  ? ? ? ? ? ? ?Past Medical History:  ?Diagnosis Date  ? Anxiety   ? Arthritis   ? Asthma   ? Bronchitis, acute   ? ?Past Surgical History:  ?Procedure Laterality Date  ? BREAST ENHANCEMENT SURGERY    ? CATARACT EXTRACTION W/ INTRAOCULAR LENS IMPLANT Bilateral   ? FACELIFT    ? local  ? ROTATOR CUFF REPAIR Right   ? TIBIA FRACTURE SURGERY  2004  ? left leg x3, tibia and fibula  ? TOTAL KNEE ARTHROPLASTY Left 10/18/2021  ? Procedure: TOTAL KNEE ARTHROPLASTY;  Surgeon: Vickey Huger, MD;  Location: [facility];  Service: Orthopedics;  Laterality: Left;  ? TUMOR EXCISION  2003  ? right muscle back, benign  ? ?Patient Active Problem List  ? Diagnosis Date Noted  ? Seasonal allergic rhinitis due to pollen 05/17/2021  ? Mild persistent asthma without complication 27/51/7001  ? Other allergic rhinitis 03/27/2019  ? DDD (degenerative disc disease), cervical 09/15/2017  ? Depression 02/02/2016  ? Arthropathy of right shoulder 02/02/2016  ? Insomnia 04/13/2011  ? Osteoporosis 04/13/2011  ? Biliary dyskinesia 04/13/2011  ? Osteoarthritis 04/13/2011  ? History of migraine headaches 04/13/2011  ? Vitamin D deficiency 04/13/2011  ? Anxiety 04/13/2011  ? Irritable bowel syndrome 04/13/2011  ? ? ?REFERRING DIAG: S/P LT TKA,TIBIA ROD REMOVAL  ? ?THERAPY DIAG:  ?No diagnosis found. ? ?PERTINENT HISTORY: 19 years ago I fell and broke the Left lower leg and  rod/plates. Bronchitis, asthma OA ? ?PRECAUTIONS: TKA Left   ?SUBJECTIVE: CPM is gone   ? ?PAIN:  I am not walking with my walking stick any more. ?Are you having pain? Yes: NPRS scale: 2/10 ?Pain location: medial joint line ?Pain description: aching  ?Aggravating factors: bending the knee ?Relieving factors: heat on the thigh, ice on the knee, CBD cream ? ? ?OBJECTIVE:  ?  ?DIAGNOSTIC FINDINGS: IMPRESSION: 05/17/21 ?1. Nondepressed, nondisplaced subchondral insufficiency fracture of ?the medial tibial plateau with severe surrounding bone marrow edema. ?2.  Tricompartmental cartilage abnormalities as described above. ?3. Radial tear of the posterior horn of the medial meniscus with ?peripheral meniscal extrusion. ?  ?PATIENT SURVEYS:  ?FOTO 33% to 57% predicted ?  ?COGNITION: ?         Overall cognitive status: Within functional limits for tasks assessed              ?          ?SENSATION: ?         Light touch: Appears intact ?         Stereognosis: Appears intact ?         Hot/Cold: Appears intact ?         Proprioception: Appears intact ?  ?MUSCLE LENGTH: ?Hamstrings:  WNL ?  ?  ?  POSTURE:  ? [place] female with average build, forward head and rounded shld, valgus Left knee ?  ?PALPATION: ?TTP over medial aspect of Left knee ?  ?LE AROM/PROM: ?  ?A/PROM Right A/P ?10/07/2021 Left ?10/07/2021  Left  ?11-02-21  ?Hip flexion 110 110   ?Hip extension       ?Hip abduction       ?Hip adduction       ?Hip internal rotation       ?Hip external rotation       ?Knee flexion 136/140 130/138 painful 105/112  ?Knee extension 0 4 9 A/ PROM 5 from extension deficit  ?Ankle dorsiflexion       ?Ankle plantarflexion       ?Ankle inversion       ?Ankle eversion       ? (Blank rows = not tested) ?  ?LE MMT: ?  ?MMT Right ?10/07/2021 Left ?10/07/2021 Left ?11-02-21  ?Hip flexion '5 5 5  '$ ?Hip extension '5 5 5  '$ ?Hip abduction 5 4 4-  ?Hip adduction       ?Hip internal rotation       ?Hip external rotation       ?Knee flexion 5 4 4-  ?Knee extension 5 4+ pain 4-  ?Ankle dorsiflexion '5 5 5  '$ ?Ankle  plantarflexion '5 5 5  '$ ?Ankle inversion       ?Ankle eversion       ? (Blank rows = not tested) ?  ?LOWER EXTREMITY SPECIAL TESTS:  ?NT ?  ?FUNCTIONAL TESTS:  ?5 times sit to stand: 16.3 sec ?11/03/2021 16.8 sec ?Squat - wt shift to R only able to squat ot 60 degree flexion ?  ?GAIT:  Pre - surgery for TKA ?Distance walked: 150 ?Assistive device utilized: None ?Level of assistance: Complete Independence ?Comments: Pt with valgus L knee and antalgic gait to unweight painful L LE ?  ?  ?  ?TODAY'S TREATMENT: ? ?[facility] Adult PT Treatment:                                                DATE: 11-11-21 ?Before treatment AROM  6 - 115 degrees  ?After treatement AROM 2 -120 ? ?Therapeutic Exercise: ?Recumbent bike 6 min, moving seat up to increase flexion every  2 minutes Level 2  ?Retro stepping 4 lengths of counter toe/heel walk ?Hamstring walk for 150 feet ?Leg press 25 # 2 x 10  ?SL press 20 # 2 x 10 L LE ?Cybex 25 # hip abd 2 x 10 L ?Cybex Hip 25# hip ext L ?Step ups on L LE 3 x 10 ? ?Manual Therapy: ?PT utilizing pt wth DF strap as pt use strap to distract at ankle with Quad setting and max strength available ?PA grade III combined with tibial ER to promote terminal knee extension ? ?[facility] Adult PT Treatment:                                                DATE: 11-09-21 ?Before treatment AROM  8 - 116 degrees after bike and hamstring walk ?After treatement AROM 4 -118 ?Therapeutic Exercise: ?Recumbent bike 6 min, moving seat up to increase flexion every  2 minutes Level  2  ?Retro stepping 4 lengths of counter toe/heel walk ?Hamstring walk for 150 feet ?5 times isometric hold hamstring L for 45 sec slightly bent (30 degrees) ?Hamstring stool scoot with L LE pull around gym 80 feet x 2 ?TKE with ball on wall 3 x 30 L ?Sit to stand with VC for Wt shifting and keeping wt shift symmetrical. Holding 15 # KB3 x 30 ?Manual Therapy: ?Patellar inferior/ superior mobs grade III ?PA grade III combined with tibial ER to promote terminal  knee extension ? ?Modalities: ?Moist Hot pack on hamstrings prior to Manual  ? ? ?[facility] Adult PT Treatment:                                                DATE: 11-04-21 ?Before treatment AROM  9 - 98 degrees ?After treatement AROM 5 -109 ?Therapeutic Exercise: ?Retro stepping 3 lengths of counter ?UE wall slide to promote involuntary quad activation 1 x 20 ?UE wall slide to promote involuntary quad activation with opposite hip extension ?Sit to stand with VC for Wt shifting and keeping wt shift symmetrical. 3 x 30 ? ?Manual Therapy: ?Patellar inferior/ superior mobs grade III ?PA grade III combined with tibial ER to promote terminal knee extension ?AP grade III 5 x 30 bouts with active flexion between bouts to promote flexion ? ?Self Care    ? Utilize walker for muscle flexion pump to  warm up for flexion activities  ? Explanation of self monitoring for increasing extension and  flexion throughout day ?Use of cold /heat for pain post PT RX  ? ? ? ?[facility] Adult PT Treatment:                                                DATE: 11/03/2021 ?Before treatment AROM  9 - 96 degrees ?After treatement AROM  8-103 ? ?Therapeutic Exercise: ?Recumbent bike full revolutions L1 x 6 min, lowering seat at 3 min to promote knee flexion ?Quat set with towel in popliteal space to promote quad activation 1 x 10 holding 5 second ?SLR 1 x 20 with sustained quad set ?Heel slides 1 x 10  ?Manual Therapy: ?Patellar inferior/ superior mobs grade III ?PA grade III combined with tibial ER to promote terminal knee extension ?AP grade III 5 x 30 bouts with active flexion between bouts to promote flexion ?Neuromuscular re-ed: ?Rocking forward/ backward with LLE advanced forward combined with ankle DF with posterior rocking to promote involuntary quad activation 1 x 20 ?  Therapeutic Activity: ?Sit to stand with cues to scoot forward and utilize rocking forward with focus of nose over toes, keeping L/R foot aligned and avoiding pushing LLE forward.   performed 1 x 5 with use of hands pushing up for the table. 1 x 12 with reaching forward to promote trunk leaning  ? ? ?[facility] Adult PT Treatment:                                                DATE: 11-02-21 ?Therapeuti

## 2021-11-11 ENCOUNTER — Encounter: Payer: Self-pay | Admitting: Physical Therapy

## 2021-11-11 ENCOUNTER — Ambulatory Visit: Payer: Medicare Other | Admitting: Physical Therapy

## 2021-11-11 DIAGNOSIS — M25562 Pain in left knee: Secondary | ICD-10-CM | POA: Diagnosis not present

## 2021-11-11 DIAGNOSIS — R600 Localized edema: Secondary | ICD-10-CM | POA: Diagnosis not present

## 2021-11-11 DIAGNOSIS — M6281 Muscle weakness (generalized): Secondary | ICD-10-CM

## 2021-11-11 DIAGNOSIS — R262 Difficulty in walking, not elsewhere classified: Secondary | ICD-10-CM

## 2021-11-11 DIAGNOSIS — G8929 Other chronic pain: Secondary | ICD-10-CM

## 2021-11-11 NOTE — Patient Instructions (Addendum)
Aquatic Therapy at [facility]-  What to Expect! ? ?Where:  ? ?[place] [facility] ?[address] ?[place],  [address] ?Rehab phone [phone] ? ?NOTE:  You will receive an automated phone message reminding you of your appt and it will say the appointment is at the [facility]. ? ?        ?How to Prepare: ?Please make sure you drink 8 ounces of water about one hour prior to your pool session ?A caregiver may attend if needed with the patient to help assist as needed. A caregiver can sit in the pool room on chair. ?Please arrive IN YOUR SUIT and 15 minutes prior to your appointment - this helps to avoid delays in starting your session. ?Please make sure to attend to any toileting needs prior to entering the pool ?[place] rooms for changing are provided.   There is direct access to the pool deck form the locker room.  You can lock your belongings in a locker with lock provided. ?Once on the pool deck your therapist will ask if you have signed the Patient  Consent and Assignment of Benefits form before beginning treatment ?Your therapist may take your blood pressure prior to, during and after your session if indicated ?We usually try and create a home exercise program based on activities we do in the pool.  Please be thinking about who might be able to assist you in the pool should you need to participate in an aquatic home exercise program at the time of discharge if you need assistance.  Some patients do not want to or do not have the ability to participate in an aquatic home program - this is not a barrier in any way to you participating in aquatic therapy as part of your current therapy plan! ?After Discharge from PT, you can continue using home program at  the [facility], there is a drop-in fee for $5 ($45 a month)or for 60 years  or older $4.00 ($40 a month for seniors ) or any local [employer] pool.  Memberships for purchase are  available for gym/pool at [place] ? ?IT IS VERY IMPORTANT THAT YOUR LAST VISIT BE IN THE CLINIC AT [facility] AFTER YOUR LAST AQUATIC VISIT.  PLEASE MAKE SURE THAT YOU HAVE A [address]  APPOINTMENT SCHEDULED. ? ? ?About the pool: ?Pool is located approximately 500 FT from the entrance of the building.  ?Please bring a support person if you need assistance traveling this      distance.  ? ?Your therapist will assist you in entering the water; there are two ways to     ?      enter: stairs with railings, and a mechanical lift. Your therapist will determine the most appropriate way for you. ? ?Water temperature is usually between 88-90 degrees ? ?There may be up to 2 other swimmers in the pool at the same time ? ?The pool deck is tile, please wear shoes with good traction if you prefer not to be barefoot.  ? ? ?Contact Info:  ?For appointment scheduling and cancellations:         Please call the [facility]  [phone]              Aquatic Therapy  ?[facility]       All sessions are 45 minutes ? ? ? ? ?        ? ?                ?  Voncille Lo, PT, ATRIC ?Certified Exercise Expert for the Aging Adult  ?11/11/21 9:57 AM ?Phone: [phone] ?Fax: [phone]                    ?

## 2021-11-16 ENCOUNTER — Ambulatory Visit: Payer: Medicare Other | Attending: Orthopedic Surgery | Admitting: Physical Therapy

## 2021-11-16 ENCOUNTER — Encounter: Payer: Self-pay | Admitting: Physical Therapy

## 2021-11-16 DIAGNOSIS — M25562 Pain in left knee: Secondary | ICD-10-CM | POA: Insufficient documentation

## 2021-11-16 DIAGNOSIS — G8929 Other chronic pain: Secondary | ICD-10-CM | POA: Insufficient documentation

## 2021-11-16 DIAGNOSIS — R262 Difficulty in walking, not elsewhere classified: Secondary | ICD-10-CM | POA: Diagnosis not present

## 2021-11-16 DIAGNOSIS — M6281 Muscle weakness (generalized): Secondary | ICD-10-CM | POA: Diagnosis not present

## 2021-11-16 DIAGNOSIS — R600 Localized edema: Secondary | ICD-10-CM | POA: Insufficient documentation

## 2021-11-16 NOTE — Therapy (Signed)
?OUTPATIENT PHYSICAL THERAPY TREATMENT NOTE ? ? ?Patient Name: Mary Garcia ?MRN: [id number] ?DOB:07/18/1946, 76 y.o., female ?Today's Date: 11/16/2021 ? ?PCP: Elby Showers, MD ?REFERRING PROVIDER: Elby Showers, MD ? ? PT End of Session - 11/16/21 0935   ? ? Visit Number 7   ? Number of Visits 16   ? Date for PT Re-Evaluation 12/09/21   ? Authorization Type BCBS MCR 6th visit FOTO  10 th visit FOTO   ? Progress Note Due on Visit 10   ? PT Start Time [phone]   ? PT Stop Time 1015   ? PT Time Calculation (min) 42 min   ? ?  ?  ? ?  ? ? ? ? ? ? ? ?Past Medical History:  ?Diagnosis Date  ? Anxiety   ? Arthritis   ? Asthma   ? Bronchitis, acute   ? ?Past Surgical History:  ?Procedure Laterality Date  ? BREAST ENHANCEMENT SURGERY    ? CATARACT EXTRACTION W/ INTRAOCULAR LENS IMPLANT Bilateral   ? FACELIFT    ? local  ? ROTATOR CUFF REPAIR Right   ? TIBIA FRACTURE SURGERY  2004  ? left leg x3, tibia and fibula  ? TOTAL KNEE ARTHROPLASTY Left 10/18/2021  ? Procedure: TOTAL KNEE ARTHROPLASTY;  Surgeon: Vickey Huger, MD;  Location: [facility];  Service: Orthopedics;  Laterality: Left;  ? TUMOR EXCISION  2003  ? right muscle back, benign  ? ?Patient Active Problem List  ? Diagnosis Date Noted  ? Seasonal allergic rhinitis due to pollen 05/17/2021  ? Mild persistent asthma without complication 08/03/7587  ? Other allergic rhinitis 03/27/2019  ? DDD (degenerative disc disease), cervical 09/15/2017  ? Depression 02/02/2016  ? Arthropathy of right shoulder 02/02/2016  ? Insomnia 04/13/2011  ? Osteoporosis 04/13/2011  ? Biliary dyskinesia 04/13/2011  ? Osteoarthritis 04/13/2011  ? History of migraine headaches 04/13/2011  ? Vitamin D deficiency 04/13/2011  ? Anxiety 04/13/2011  ? Irritable bowel syndrome 04/13/2011  ? ? ?REFERRING DIAG: S/P LT TKA,TIBIA ROD REMOVAL  ? ?THERAPY DIAG:  ?Chronic pain of left knee ? ?Muscle weakness (generalized) ? ?Difficulty in walking, not elsewhere classified ? ?Localized edema ? ?PERTINENT HISTORY: 19 years  ago I fell and broke the Left lower leg and  rod/plates. Bronchitis, asthma OA ? ?PRECAUTIONS: TKA Left  ?SUBJECTIVE: " I have had a rough couple of days the last few days. I have been working on doing some cooking the last few days and felt pretty sore from it." ? ?PAIN:  I am not walking with my walking stick any more. ?Are you having pain? Yes: NPRS scale: 33/10 ?Pain location: medial joint line ?Pain description: aching  ?Aggravating factors: bending the knee ?Relieving factors: heat on the thigh, ice on the knee, CBD cream ? ? ?OBJECTIVE:  ?  ?DIAGNOSTIC FINDINGS: IMPRESSION: 05/17/21 ?1. Nondepressed, nondisplaced subchondral insufficiency fracture of ?the medial tibial plateau with severe surrounding bone marrow edema. ?2.  Tricompartmental cartilage abnormalities as described above. ?3. Radial tear of the posterior horn of the medial meniscus with ?peripheral meniscal extrusion. ?  ?PATIENT SURVEYS:  ?FOTO 33% to 57% predicted ?  ?COGNITION: ?         Overall cognitive status: Within functional limits for tasks assessed              ?          ?SENSATION: ?         Light touch: Appears intact ?  Stereognosis: Appears intact ?         Hot/Cold: Appears intact ?         Proprioception: Appears intact ?  ?MUSCLE LENGTH: ?Hamstrings:  WNL ?  ?  ?POSTURE:  ? [place] female with average build, forward head and rounded shld, valgus Left knee ?  ?PALPATION: ?TTP over medial aspect of Left knee ?  ?LE AROM/PROM: ?  ?A/PROM Right A/P ?10/07/2021 Left ?10/07/2021  Left  ?11-02-21  ?Hip flexion 110 110   ?Hip extension       ?Hip abduction       ?Hip adduction       ?Hip internal rotation       ?Hip external rotation       ?Knee flexion 136/140 130/138 painful 105/112  ?Knee extension 0 4 9 A/ PROM 5 from extension deficit  ?Ankle dorsiflexion       ?Ankle plantarflexion       ?Ankle inversion       ?Ankle eversion       ? (Blank rows = not tested) ?  ?LE MMT: ?  ?MMT Right ?10/07/2021 Left ?10/07/2021 Left ?11-02-21   ?Hip flexion '5 5 5  '$ ?Hip extension '5 5 5  '$ ?Hip abduction 5 4 4-  ?Hip adduction       ?Hip internal rotation       ?Hip external rotation       ?Knee flexion 5 4 4-  ?Knee extension 5 4+ pain 4-  ?Ankle dorsiflexion '5 5 5  '$ ?Ankle plantarflexion '5 5 5  '$ ?Ankle inversion       ?Ankle eversion       ? (Blank rows = not tested) ?  ?LOWER EXTREMITY SPECIAL TESTS:  ?NT ?  ?FUNCTIONAL TESTS:  ?5 times sit to stand: 16.3 sec ?11/03/2021 16.8 sec ?Squat - wt shift to R only able to squat ot 60 degree flexion ?  ?GAIT:  Pre - surgery for TKA ?Distance walked: 150 ?Assistive device utilized: None ?Level of assistance: Complete Independence ?Comments: Pt with valgus L knee and antalgic gait to unweight painful L LE ?  ?  ?  ?TODAY'S TREATMENT: ?[facility] Adult PT Treatment:                                                DATE: 11/16/2021 ?AROM total arc 4 - 124 ? ?Therapeutic Exercise: ?Reucubent bike L3 x 6 min lowering seat ever 2 min gradually to promote knee flexion ?LAQ 2 x 12 with 5#  LLE only ?Leg press 2 x 15 40# bil LE ?Hamstring stretch 2 x 30 sec seated ?Lateral band walks 2 x 10 with RTB ?Manual Therapy: ?X friction massage along incision ?Tibial ER grade III combined with quad set to promote terminal knee extension ? ? ?[facility] Adult PT Treatment:                                                DATE: 11-11-21 ?Before treatment AROM  6 - 115 degrees  ?After treatement AROM 2 -120 ? ?Therapeutic Exercise: ?Recumbent bike 6 min, moving seat up to increase flexion every  2 minutes Level 2  ?Retro stepping 4 lengths of counter toe/heel walk ?Hamstring  walk for 150 feet ?Leg press 25 # 2 x 10  ?SL press 20 # 2 x 10 L LE ?Cybex 25 # hip abd 2 x 10 L ?Cybex Hip 25# hip ext L ?Step ups on L LE 3 x 10 ? ?Manual Therapy: ?PT utilizing pt wth DF strap as pt use strap to distract at ankle with Quad setting and max strength available ?PA grade III combined with tibial ER to promote terminal knee extension ? ?[facility] Adult PT Treatment:                                                 DATE: 11-09-21 ?Before treatment AROM  8 - 116 degrees after bike and hamstring walk ?After treatement AROM 4 -118 ?Therapeutic Exercise: ?Recumbent bike 6 min, moving seat up to increase flexion every  2 minutes Level 2  ?Retro stepping 4 lengths of counter toe/heel walk ?Hamstring walk for 150 feet ?5 times isometric hold hamstring L for 45 sec slightly bent (30 degrees) ?Hamstring stool scoot with L LE pull around gym 80 feet x 2 ?TKE with ball on wall 3 x 30 L ?Sit to stand with VC for Wt shifting and keeping wt shift symmetrical. Holding 15 # KB3 x 30 ?Manual Therapy: ?Patellar inferior/ superior mobs grade III ?PA grade III combined with tibial ER to promote terminal knee extension ? ?Modalities: ?Moist Hot pack on hamstrings prior to Manual  ?  ?  ?PATIENT EDUCATION:  ?Education details:  POC, FOTO report, How to measure AROM at home for pt. Cryotherapy, Timeline for TKA, PRICE ?healing/expectations for early phase post surgery, intial HEP ?Person educated: Patient ?Education method: Explanation, Demonstration, Tactile cues, Verbal cues, and Handouts ?Education comprehension: verbalized understanding, returned demonstration, and needs further education ?  ?  ?HOME EXERCISE PROGRAM: ?Access Code: [email or web address] ?URL: [email or web address] ?Date: 11/16/2021 ?Prepared by: Starr Lake ? ?Program Notes ?Retro walkHamstring walk at counterIsometric hamstring hold for 45 sec x 5  ? ?Exercises ?- Supine Knee Extension Stretch on Towel Roll  - 1 x daily - 7 x weekly - 3 sets - 10 reps ?- Supine Heel Slide with Strap  - 1 x daily - 7 x weekly - 3 sets - 10 reps ?- Supine Short Arc Quad  - 1 x daily - 7 x weekly - 3 sets - 10 reps ?- Hamstring Stretch with Strap  - 1 x daily - 7 x weekly - 3 sets - 10 reps ?- Long Arc Quad  - 1 x daily - 7 x weekly - 3 sets - 10 reps ?- Mini Squat  - 1 x daily - 7 x weekly - 3 sets - 10 reps ?- Sit to Stand Without Arm Support  - 1 x  daily - 7 x weekly - 2 sets - 10 reps ?- Standing Terminal Knee Extension at Wall with Ball  - 1 x daily - 7 x weekly - 3 sets - 10 reps ?- X Band Walk  - 1 x daily - 7 x weekly - 2 sets - 10 reps ?- Squa

## 2021-11-16 NOTE — Therapy (Signed)
?OUTPATIENT PHYSICAL THERAPY TREATMENT NOTE ? ? ?Patient Name: Mary Garcia ?MRN: [id number] ?DOB:[birth year], 76 y.o., female ?Today's Date: 11/17/2021 ? ?PCP: Mary Showers, MD ?REFERRING PROVIDER: Vickey Huger, MD ? ? PT End of Session - 11/17/21 1333   ? ? Visit Number 8   ? Number of Visits 16   ? Date for PT Re-Evaluation 12/09/21   ? Authorization Type [employer] MCR 6th visit FOTO  10 th visit FOTO   ? Progress Note Due on Visit 10   ? PT Start Time 1330   ? PT Stop Time 0623   ? PT Time Calculation (min) 45 min   ? ?  ?  ? ?  ? ? ? ? ? ? ? ? ?Past Medical History:  ?Diagnosis Date  ? Anxiety   ? Arthritis   ? Asthma   ? Bronchitis, acute   ? ?Past Surgical History:  ?Procedure Laterality Date  ? BREAST ENHANCEMENT SURGERY    ? CATARACT EXTRACTION W/ INTRAOCULAR LENS IMPLANT Bilateral   ? FACELIFT    ? local  ? ROTATOR CUFF REPAIR Right   ? TIBIA FRACTURE SURGERY  2004  ? left leg x3, tibia and fibula  ? TOTAL KNEE ARTHROPLASTY Left 10/18/2021  ? Procedure: TOTAL KNEE ARTHROPLASTY;  Surgeon: Mary Huger, MD;  Location: [facility];  Service: Orthopedics;  Laterality: Left;  ? TUMOR EXCISION  2003  ? right muscle back, benign  ? ?Patient Active Problem List  ? Diagnosis Date Noted  ? Seasonal allergic rhinitis due to pollen 05/17/2021  ? Mild persistent asthma without complication 76/28/3151  ? Other allergic rhinitis 03/27/2019  ? DDD (degenerative disc disease), cervical 09/15/2017  ? Depression 02/02/2016  ? Arthropathy of right shoulder 02/02/2016  ? Insomnia 04/13/2011  ? Osteoporosis 04/13/2011  ? Biliary dyskinesia 04/13/2011  ? Osteoarthritis 04/13/2011  ? History of migraine headaches 04/13/2011  ? Vitamin D deficiency 04/13/2011  ? Anxiety 04/13/2011  ? Irritable bowel syndrome 04/13/2011  ? ? ?REFERRING DIAG: S/P LT TKA,TIBIA ROD REMOVAL  ? ?THERAPY DIAG:  ?Chronic pain of left knee ? ?Muscle weakness (generalized) ? ?Difficulty in walking, not elsewhere classified ? ?Localized edema ? ?PERTINENT HISTORY: 19 years  ago I fell and broke the Left lower leg and  rod/plates. Bronchitis, asthma OA ? ?PRECAUTIONS: TKA Left  ?SUBJECTIVE:  No pain today.  I am looking forward to pool. ? ?PAIN:  I am not walking with my walking stick any more. ?Are you having pain? Yes: NPRS scale: 0/10 ?Pain location: medial joint line ?Pain description: aching  ?Aggravating factors: bending the knee ?Relieving factors: heat on the thigh, ice on the knee, CBD cream ? ? ?OBJECTIVE:  ?  ?DIAGNOSTIC FINDINGS: IMPRESSION: 05/17/21 ?1. Nondepressed, nondisplaced subchondral insufficiency fracture of ?the medial tibial plateau with severe surrounding bone marrow edema. ?2.  Tricompartmental cartilage abnormalities as described above. ?3. Radial tear of the posterior horn of the medial meniscus with ?peripheral meniscal extrusion. ?  ?PATIENT SURVEYS:  ?FOTO 33% to 57% predicted ?  ?COGNITION: ?         Overall cognitive status: Within functional limits for tasks assessed              ?          ?SENSATION: ?         Light touch: Appears intact ?         Stereognosis: Appears intact ?         Hot/Cold: Appears  intact ?         Proprioception: Appears intact ?  ?MUSCLE LENGTH: ?Hamstrings:  WNL ?  ?  ?POSTURE:  ? [place] female with average build, forward head and rounded shld, valgus Left knee ?  ?PALPATION: ?TTP over medial aspect of Left knee ?  ?LE AROM/PROM: ?  ?A/PROM Right A/P ?10/07/2021 Left ?10/07/2021  Left  ?11-02-21  ?Hip flexion 110 110   ?Hip extension       ?Hip abduction       ?Hip adduction       ?Hip internal rotation       ?Hip external rotation       ?Knee flexion 136/140 130/138 painful 105/112  ?Knee extension 0 4 9 A/ PROM 5 from extension deficit  ?Ankle dorsiflexion       ?Ankle plantarflexion       ?Ankle inversion       ?Ankle eversion       ? (Blank rows = not tested) ?  ?LE MMT: ?  ?MMT Right ?10/07/2021 Left ?10/07/2021 Left ?11-02-21  ?Hip flexion _0 ?Hip extension _1 ?Hip abduction 5 4 4-  ?Hip adduction       ?Hip internal  rotation       ?Hip external rotation       ?Knee flexion 5 4 4-  ?Knee extension 5 4+ pain 4-  ?Ankle dorsiflexion _2 ?Ankle plantarflexion _3 ?Ankle inversion       ?Ankle eversion       ? (Blank rows = not tested) ?  ?LOWER EXTREMITY SPECIAL TESTS:  ?NT ?  ?FUNCTIONAL TESTS:  ?5 times sit to stand: 16.3 sec ?11/03/2021 16.8 sec ?Squat - wt shift to R only able to squat ot 60 degree flexion ?  ?GAIT:  Pre - surgery for TKA ?Distance walked: 150 ?Assistive device utilized: None ?Level of assistance: Complete Independence ?Comments: Pt with valgus L knee and antalgic gait to unweight painful L LE ?  ?  ?  ?TODAY'S TREATMENT: ?[facility] Adult PT Treatment:                                                DATE: 11-17-21 ?Aquatic therapy at [address] - therapeutic pool temp 92 degrees.  Pt enters building without AD.  Treatment took place in water 3.8 to  4 ft 8 in.feet deep depending upon activity.  Pt entered and exited the pool via stair and handrails independently. Mary Garcia PTA present today for orientation and observation of RX ?  ?Mary Garcia  ambulates to acclimate to water by walking 8  lengths of pool forward, backward and side stepping. No pain noted retro walking as well ?  ?Runners stretch  2 x 30 sec on Left and then moves into hamstring stretch  Then runners stretch on R 2 x 30 sec  and then move into hamstring stretch . TC to insure proper technique. ?Figure 4 squat stretch with UE support for R and L x 60 sec each   ?Aqua stretch over Left  quad and scar tissue massage with relief of pain at Left knee ? ?On edge of pool with bil UE support,  pt performed LE exercise  ?Hip abd/add R/L 20 x each and then using 1 UE support ?  Hip ext/flex with knee straight x 20, pt needing VC and TC for correct execution and sequencing ?Marching knee/hip 90/90 x 10  ?Gastroc stretch against pool wall ?Ham curl R/L x 20.  ?Squats x 20 reps with intermittent UE support x 2 sets. ?SL squat on L and  R ?Reverse Lunge ?On submerged step in waist deep water Forward step up and Step Down with L knee on step ? ?Bicycle  for 3 continuous minutes with Yellow aquatic DB in UE, jumping jacks ?Supine abdominal hollowing with VC to keep hips up and feet up with barbells in UE submerged as much as possible in 5 minutes  Pt fatigued and rested.  ?  ?Pt requires the buoyancy of water for active assisted exercises with buoyancy supported for strengthening and AROM exercises: PT  requires the viscosity of the water for resistance with strengthening exercises ?Water will allow for  reduced joint loading through buoyancy to help patient improve posture without excess stress and pain. ? ?[place] Adult PT Treatment:                                                DATE: 11/16/2021 ?AROM total arc 4 - 124 ? ?Therapeutic Exercise: ?Reucumbent bike L3 x 6 min lowering seat ever 2 min gradually to promote knee flexion ?LAQ 2 x 12 with 5#  LLE only ?Leg press 2 x 15 40# bil LE ?Hamstring stretch 2 x 30 sec seated ?Lateral band walks 2 x 10 with RTB ?Manual Therapy: ?X friction massage along incision ?Tibial ER grade III combined with quad set to promote terminal knee extension ? ? ?[facility] Adult PT Treatment:                                                DATE: 11-11-21 ?Before treatment AROM  6 - 115 degrees  ?After treatement AROM 2 -120 ? ?Therapeutic Exercise: ?Recumbent bike 6 min, moving seat up to increase flexion every  2 minutes Level 2  ?Retro stepping 4 lengths of counter toe/heel walk ?Hamstring walk for 150 feet ?Leg press 25 # 2 x 10  ?SL press 20 # 2 x 10 L LE ?Cybex 25 # hip abd 2 x 10 L ?Cybex Hip 25# hip ext L ?Step ups on L LE 3 x 10 ? ?Manual Therapy: ?PT utilizing pt wth DF strap as pt use strap to distract at ankle with Quad setting and max strength available ?PA grade III combined with tibial ER to promote terminal knee extension ? ?[facility] Adult PT Treatment:                                                DATE:  11-09-21 ?Before treatment AROM  8 - 116 degrees after bike and hamstring walk ?After treatement AROM 4 -118 ?Therapeutic Exercise: ?Recumbent bike 6 min, moving seat up to increase flexion every  2 minutes Level 2  ?Ret

## 2021-11-17 ENCOUNTER — Ambulatory Visit: Payer: PPO | Admitting: Allergy

## 2021-11-17 ENCOUNTER — Ambulatory Visit: Payer: Self-pay | Admitting: Physical Therapy

## 2021-11-17 ENCOUNTER — Ambulatory Visit: Payer: Medicare Other | Admitting: Physical Therapy

## 2021-11-17 ENCOUNTER — Encounter: Payer: Self-pay | Admitting: Physical Therapy

## 2021-11-17 DIAGNOSIS — R262 Difficulty in walking, not elsewhere classified: Secondary | ICD-10-CM

## 2021-11-17 DIAGNOSIS — R600 Localized edema: Secondary | ICD-10-CM | POA: Diagnosis not present

## 2021-11-17 DIAGNOSIS — M6281 Muscle weakness (generalized): Secondary | ICD-10-CM | POA: Diagnosis not present

## 2021-11-17 DIAGNOSIS — G8929 Other chronic pain: Secondary | ICD-10-CM

## 2021-11-17 DIAGNOSIS — M25562 Pain in left knee: Secondary | ICD-10-CM | POA: Diagnosis not present

## 2021-11-18 ENCOUNTER — Encounter: Payer: Medicare Other | Admitting: Physical Therapy

## 2021-11-22 NOTE — Therapy (Signed)
?OUTPATIENT PHYSICAL THERAPY TREATMENT NOTE ? ? ?Patient Name: Mary Garcia ?MRN: [id number] ?DOB:Jan 16, 1946, 76 y.o., female ?Today's Date: 11/23/2021 ? ?PCP: Elby Showers, MD ?REFERRING PROVIDER: Elby Showers, MD ? ? PT End of Session - 11/23/21 0937   ? ? Visit Number 9   ? Number of Visits 16   ? Date for PT Re-Evaluation 12/09/21   ? Authorization Type [employer] MCR 6th visit FOTO  10 th visit FOTO   ? PT Start Time [phone]   ? PT Stop Time 1015   ? PT Time Calculation (min) 38 min   ? Activity Tolerance Patient tolerated treatment well   ? Behavior During Therapy [facility] for tasks assessed/performed   ? ?  ?  ? ?  ? ? ? ? ? ? ? ? ? ?Past Medical History:  ?Diagnosis Date  ? Anxiety   ? Arthritis   ? Asthma   ? Bronchitis, acute   ? ?Past Surgical History:  ?Procedure Laterality Date  ? BREAST ENHANCEMENT SURGERY    ? CATARACT EXTRACTION W/ INTRAOCULAR LENS IMPLANT Bilateral   ? FACELIFT    ? local  ? ROTATOR CUFF REPAIR Right   ? TIBIA FRACTURE SURGERY  2004  ? left leg x3, tibia and fibula  ? TOTAL KNEE ARTHROPLASTY Left 10/18/2021  ? Procedure: TOTAL KNEE ARTHROPLASTY;  Surgeon: Vickey Huger, MD;  Location: [facility];  Service: Orthopedics;  Laterality: Left;  ? TUMOR EXCISION  2003  ? right muscle back, benign  ? ?Patient Active Problem List  ? Diagnosis Date Noted  ? Seasonal allergic rhinitis due to pollen 05/17/2021  ? Mild persistent asthma without complication 35/57/3220  ? Other allergic rhinitis 03/27/2019  ? DDD (degenerative disc disease), cervical 09/15/2017  ? Depression 02/02/2016  ? Arthropathy of right shoulder 02/02/2016  ? Insomnia 04/13/2011  ? Osteoporosis 04/13/2011  ? Biliary dyskinesia 04/13/2011  ? Osteoarthritis 04/13/2011  ? History of migraine headaches 04/13/2011  ? Vitamin D deficiency 04/13/2011  ? Anxiety 04/13/2011  ? Irritable bowel syndrome 04/13/2011  ? ? ?REFERRING DIAG: S/P LT TKA,TIBIA ROD REMOVAL  ? ?THERAPY DIAG:  ?Chronic pain of left knee ? ?Muscle weakness (generalized) ? ?Difficulty  in walking, not elsewhere classified ? ?Localized edema ? ?PERTINENT HISTORY: 19 years ago I fell and broke the Left lower leg and  rod/plates. Bronchitis, asthma OA ? ?PRECAUTIONS: TKA Left  ?SUBJECTIVE:  I am a 2/10 and I think I hyperextended my knee while I was walking fast out to the car to put up my jacket before PT. This [place], pain in left fat pad but it resolved ? ?PAIN:  ? ?Are you having pain? Yes: NPRS scale: 2/10 ?Pain location: medial joint line ?Pain description: aching  ?Aggravating factors: bending the knee ?Relieving factors: heat on the thigh, ice on the knee, CBD cream ?2/10 over superior  pole of patella  ? ?OBJECTIVE:  ?  ?DIAGNOSTIC FINDINGS: IMPRESSION: 05/17/21 ?1. Nondepressed, nondisplaced subchondral insufficiency fracture of ?the medial tibial plateau with severe surrounding bone marrow edema. ?2.  Tricompartmental cartilage abnormalities as described above. ?3. Radial tear of the posterior horn of the medial meniscus with ?peripheral meniscal extrusion. ?  ?PATIENT SURVEYS:  ?FOTO 33% to 57% predicted ?  ?COGNITION: ?         Overall cognitive status: Within functional limits for tasks assessed              ?          ?  SENSATION: ?         Light touch: Appears intact ?         Stereognosis: Appears intact ?         Hot/Cold: Appears intact ?         Proprioception: Appears intact ?  ?MUSCLE LENGTH: ?Hamstrings:  WNL ?  ?  ?POSTURE:  ? [place] female with average build, forward head and rounded shld, valgus Left knee ?  ?PALPATION: ?TTP over medial aspect of Left knee ?  ?LE AROM/PROM: ?  ?A/PROM Right A/P ?10/07/2021 Left ?10/07/2021  Left  ?11-02-21 RIGHT ?11-23-21  ?Hip flexion 110 110    ?Hip extension        ?Hip abduction        ?Hip adduction        ?Hip internal rotation        ?Hip external rotation        ?Knee flexion 136/140 130/138 painful 105/112 125  ?Knee extension 0 4 9 A/ PROM 5 from extension deficit 0  ?Ankle dorsiflexion        ?Ankle  plantarflexion        ?Ankle inversion        ?Ankle eversion        ? (Blank rows = not tested) ?  ?LE MMT: ?  ?MMT Right ?10/07/2021 Left ?10/07/2021 Left ?11-02-21  ?Hip flexion '5 5 5  '$ ?Hip extension '5 5 5  '$ ?Hip abduction 5 4 4-  ?Hip adduction       ?Hip internal rotation       ?Hip external rotation       ?Knee flexion 5 4 4-  ?Knee extension 5 4+ pain 4-  ?Ankle dorsiflexion '5 5 5  '$ ?Ankle plantarflexion '5 5 5  '$ ?Ankle inversion       ?Ankle eversion       ? (Blank rows = not tested) ?  ?LOWER EXTREMITY SPECIAL TESTS:  ?NT ?  ?FUNCTIONAL TESTS:  ?5 times sit to stand: 16.3 sec ?11/03/2021 16.8 sec ?Squat - wt shift to R only able to squat ot 60 degree flexion ?  ?GAIT:  Pre - surgery for TKA ?Distance walked: 150 ?Assistive device utilized: None ?Level of assistance: Complete Independence ?Comments: Pt with valgus L knee and antalgic gait to unweight painful L LE ?  ?  ?  ?TODAY'S TREATMENT: ? ?[place] Adult PT Treatment:                                                DATE: 11-23-21 ?AROM total arc(before exercise  0 - 125 ? ?Therapeutic Exercise: ?Tall plank to downward dog 5 x ?Hip hinge x 10 with dowel ?Hip hinge x 10 with wall as external cue ? [place] Deadlift with Dumbbells just past Knees ONLY   3x 8 with 8 lb in each hand ? Goblet Squat with Kettlebell  25 # 1 x 10  ?Spanish squat with BTB around knees  25 # 2 x 10 ? ?Manual Therapy: ?Scar tissue massage ?Myofascial release of quad above superior pole ? ? ?[facility] Adult PT Treatment:  DATE: 11-17-21 ?Aquatic therapy at [address] - therapeutic pool temp 92 degrees.  Pt enters building without AD.  Treatment took place in water 3.8 to  4 ft 8 in.feet deep depending upon activity.  Pt entered and exited the pool via stair and handrails independently. Evelene Croon PTA present today for orientation and observation of RX ?  ?Ms Echeverria  ambulates to acclimate to water by walking 8  lengths of pool  forward, backward and side stepping. No pain noted retro walking as well ?  ?Runners stretch  2 x 30 sec on Left and then moves into hamstring stretch  Then runners stretch on R 2 x 30 sec  and then move into hamstring stretch . TC to insure proper technique. ?Figure 4 squat stretch with UE support for R and L x 60 sec each   ?Aqua stretch over Left  quad and scar tissue massage with relief of pain at Left knee ? ?On edge of pool with bil UE support,  pt performed LE exercise  ?Hip abd/add R/L 20 x each and then using 1 UE support ?Hip ext/flex with knee straight x 20, pt needing VC and TC for correct execution and sequencing ?Marching knee/hip 90/90 x 10  ?Gastroc stretch against pool wall ?Ham curl R/L x 20.  ?Squats x 20 reps with intermittent UE support x 2 sets. ?SL squat on L and R ?Reverse Lunge ?On submerged step in waist deep water Forward step up and Step Down with L knee on step ? ?Bicycle  for 3 continuous minutes with Yellow aquatic DB in UE, jumping jacks ?Supine abdominal hollowing with VC to keep hips up and feet up with barbells in UE submerged as much as possible in 5 minutes  Pt fatigued and rested.  ?  ?Pt requires the buoyancy of water for active assisted exercises with buoyancy supported for strengthening and AROM exercises: PT  requires the viscosity of the water for resistance with strengthening exercises ?Water will allow for  reduced joint loading through buoyancy to help patient improve posture without excess stress and pain. ? ?[place] Adult PT Treatment:                                                DATE: 11/16/2021 ?AROM total arc 4 - 124 ? ?Therapeutic Exercise: ?Reucumbent bike L3 x 6 min lowering seat ever 2 min gradually to promote knee flexion ?LAQ 2 x 12 with 5#  LLE only ?Leg press 2 x 15 40# bil LE ?Hamstring stretch 2 x 30 sec seated ?Lateral band walks 2 x 10 with RTB ?Manual Therapy: ?X friction massage along incision ?Tibial ER grade III combined with quad set to promote  terminal knee extension ? ? ?[facility] Adult PT Treatment:                                                DATE: 11-11-21 ?Before treatment AROM  6 - 115 degrees  ?After treatement AROM 2 -120 ? ?Therapeutic Exercise: ?Rec

## 2021-11-23 ENCOUNTER — Ambulatory Visit: Payer: Medicare Other | Admitting: Physical Therapy

## 2021-11-23 DIAGNOSIS — R600 Localized edema: Secondary | ICD-10-CM

## 2021-11-23 DIAGNOSIS — R262 Difficulty in walking, not elsewhere classified: Secondary | ICD-10-CM

## 2021-11-23 DIAGNOSIS — M25562 Pain in left knee: Secondary | ICD-10-CM | POA: Diagnosis not present

## 2021-11-23 DIAGNOSIS — G8929 Other chronic pain: Secondary | ICD-10-CM | POA: Diagnosis not present

## 2021-11-23 DIAGNOSIS — M6281 Muscle weakness (generalized): Secondary | ICD-10-CM | POA: Diagnosis not present

## 2021-11-23 NOTE — Therapy (Signed)
?OUTPATIENT PHYSICAL THERAPY TREATMENT NOTE/PROGRESS NOTE ? ?Progress Note ?Reporting Period 10-07-21 to 11-24-21 ? ?See note below for Objective Data and Assessment of Progress/Goals.  ? ?  ?Patient Name: Mary Garcia ?MRN: [id number] ?DOB:October 28, 1945, 76 y.o., female ?Today's Date: 11/24/2021 ? ?PCP: Elby Showers, MD ?REFERRING PROVIDER: Elby Showers, MD ? ? PT End of Session - 11/24/21 1333   ? ? Visit Number 10   ? Number of Visits 16   ? Date for PT Re-Evaluation 12/09/21   ? Authorization Type [employer] MCR 6th visit FOTO  10 th visit FOTO   ? Progress Note Due on Visit 10   ? PT Start Time 1330   ? PT Stop Time 5366   ? PT Time Calculation (min) 45 min   ? Activity Tolerance Patient tolerated treatment well   ? Behavior During Therapy [facility] for tasks assessed/performed   ? ?  ?  ? ?  ? ? ? ? ? ? ? ? ? ? ?Past Medical History:  ?Diagnosis Date  ? Anxiety   ? Arthritis   ? Asthma   ? Bronchitis, acute   ? ?Past Surgical History:  ?Procedure Laterality Date  ? BREAST ENHANCEMENT SURGERY    ? CATARACT EXTRACTION W/ INTRAOCULAR LENS IMPLANT Bilateral   ? FACELIFT    ? local  ? ROTATOR CUFF REPAIR Right   ? TIBIA FRACTURE SURGERY  2004  ? left leg x3, tibia and fibula  ? TOTAL KNEE ARTHROPLASTY Left 10/18/2021  ? Procedure: TOTAL KNEE ARTHROPLASTY;  Surgeon: Vickey Huger, MD;  Location: [facility];  Service: Orthopedics;  Laterality: Left;  ? TUMOR EXCISION  2003  ? right muscle back, benign  ? ?Patient Active Problem List  ? Diagnosis Date Noted  ? Seasonal allergic rhinitis due to pollen 05/17/2021  ? Mild persistent asthma without complication 44/10/4740  ? Other allergic rhinitis 03/27/2019  ? DDD (degenerative disc disease), cervical 09/15/2017  ? Depression 02/02/2016  ? Arthropathy of right shoulder 02/02/2016  ? Insomnia 04/13/2011  ? Osteoporosis 04/13/2011  ? Biliary dyskinesia 04/13/2011  ? Osteoarthritis 04/13/2011  ? History of migraine headaches 04/13/2011  ? Vitamin D deficiency 04/13/2011  ? Anxiety 04/13/2011  ?  Irritable bowel syndrome 04/13/2011  ? ? ?REFERRING DIAG: S/P LT TKA,TIBIA ROD REMOVAL  ? ?THERAPY DIAG:  ?Chronic pain of left knee ? ?Muscle weakness (generalized) ? ?Difficulty in walking, not elsewhere classified ? ?Localized edema ? ?PERTINENT HISTORY: 19 years ago I fell and broke the Left lower leg and  rod/plates. Bronchitis, asthma OA ? ?PRECAUTIONS: TKA Left  ?SUBJECTIVE:  I am a 2/10  in my knee for working so much to get ready to move ? ?PAIN:  ? ?Are you having pain? Yes: NPRS scale: 2/10 ?Pain location: medial joint line ?Pain description: aching  ?Aggravating factors: bending the knee ?Relieving factors: heat on the thigh, ice on the knee, CBD cream ?2/10 over superior  pole of patella  ? ?OBJECTIVE:  ?  ?DIAGNOSTIC FINDINGS: IMPRESSION: 05/17/21 ?1. Nondepressed, nondisplaced subchondral insufficiency fracture of ?the medial tibial plateau with severe surrounding bone marrow edema. ?2.  Tricompartmental cartilage abnormalities as described above. ?3. Radial tear of the posterior horn of the medial meniscus with ?peripheral meniscal extrusion. ?  ?PATIENT SURVEYS:  ?FOTO 33% to 57% predicted ?  ?COGNITION: ?         Overall cognitive status: Within functional limits for tasks assessed              ?          ?  SENSATION: ?         Light touch: Appears intact ?         Stereognosis: Appears intact ?         Hot/Cold: Appears intact ?         Proprioception: Appears intact ?  ?MUSCLE LENGTH: ?Hamstrings:  WNL ?  ?  ?POSTURE:  ? [place] female with average build, forward head and rounded shld, valgus Left knee ?  ?PALPATION: ?TTP over medial aspect of Left knee ?  ?LE AROM/PROM: ?  ?A/PROM Right A/P ?10/07/2021 Left ?10/07/2021  Left  ?11-02-21 RIGHT ?11-23-21  ?Hip flexion 110 110    ?Hip extension        ?Hip abduction        ?Hip adduction        ?Hip internal rotation        ?Hip external rotation        ?Knee flexion 136/140 130/138 painful 105/112 125  ?Knee extension 0 4 9 A/ PROM 5 from extension  deficit 0  ?Ankle dorsiflexion        ?Ankle plantarflexion        ?Ankle inversion        ?Ankle eversion        ? (Blank rows = not tested) ?  ?LE MMT: ?  ?MMT Right ?10/07/2021 Left ?10/07/2021 Left ?11-02-21  ?Hip flexion '5 5 5  '$ ?Hip extension '5 5 5  '$ ?Hip abduction 5 4 4-  ?Hip adduction       ?Hip internal rotation       ?Hip external rotation       ?Knee flexion 5 4 4-  ?Knee extension 5 4+ pain 4-  ?Ankle dorsiflexion '5 5 5  '$ ?Ankle plantarflexion '5 5 5  '$ ?Ankle inversion       ?Ankle eversion       ? (Blank rows = not tested) ?  ?LOWER EXTREMITY SPECIAL TESTS:  ?NT ?  ?FUNCTIONAL TESTS:  ?5 times sit to stand: 16.3 sec ?11/03/2021 16.8 sec ?Squat - wt shift to R only able to squat ot 60 degree flexion ?  ?GAIT:  Pre - surgery for TKA ?Distance walked: 150 ?Assistive device utilized: None ?Level of assistance: Complete Independence ?Comments: Pt with valgus L knee and antalgic gait to unweight painful L LE ?  ?  ?  ?TODAY'S TREATMENT: ?[facility] Adult PT Treatment:                                                DATE: 11-24-21 ? ?   AROM Arc Left knee 0-129 post aquatics ?Aquatic therapy at [address] - therapeutic pool temp 92 degrees.  Pt enters building without AD.  Treatment took place in water 3.8 to  4 ft 8 in.feet deep depending upon activity.  Pt entered and exited the pool via stair and handrails independently.  ?  ?Runners stretch  2 x 30 sec on Left and then moves into hamstring stretch  Then runners stretch on R 2 x 30 sec  and then move into hamstring stretch . TC to insure proper technique. ? ? ?On edge of pool with bil UE support,  pt performed LE exercise  with 2 lb weights around ankle ?Hip abd/add R/L 20 x each and then using 1 UE support ?Hip ext/flex with knee  straight x 15, pt needing VC and TC for correct execution and sequencing ?Marching knee/hip 90/90 x 10  ?Squats x 20 reps with intermittent UE support x 2 sets. ?SL squat on L and R ?Reverse Lunge ?On submerged step in waist  deep water Forward step up and Step Down with L knee on step, then with R knee on step with forward step up and Step down  15 x each ? ?Ms Durney  ambulates in water by walking 12  lengths of pool forward, backward and side stepping. And lunge walking No pain noted retro walking as well ?Ascending and descending pool steps 5 steps x 8.  ? ?Bicycle  for 3 continuous minutes with Yellow noodle and propelling self backwards  ? ?Supine abdominal hollowing with VC to keep hips up and feet up with barbells in UE submerged as much as possible in 8 minutes   ?  ?Pt requires the buoyancy of water for active assisted exercises with buoyancy supported for strengthening and AROM exercises: PT  requires the viscosity of the water for resistance with strengthening exercises ?Water will allow for  reduced joint loading through buoyancy to help patient improve posture without excess stress and pain. ? ?[facility] Adult PT Treatment:                                                DATE: 11-23-21 ?AROM total arc(before exercise  0 - 125 ? ?Therapeutic Exercise: ?Tall plank to downward dog 5 x ?Hip hinge x 10 with dowel ?Hip hinge x 10 with wall as external cue ? [place] Deadlift with Dumbbells just past Knees ONLY   3x 8 with 8 lb in each hand ? Goblet Squat with Kettlebell  25 # 1 x 10  ?Spanish squat with BTB around knees  25 # 2 x 10 ? ?Manual Therapy: ?Scar tissue massage ?Myofascial release of quad above superior pole ? ? ?[facility] Adult PT Treatment:                                                DATE: 11-17-21 ?Aquatic therapy at [address] - therapeutic pool temp 92 degrees.  Pt enters building without AD.  Treatment took place in water 3.8 to  4 ft 8 in.feet deep depending upon activity.  Pt entered and exited the pool via stair and handrails independently. Evelene Croon PTA present today for orientation and observation of RX ?  ?Ms Corvera  ambulates to acclimate to water by walking 8  lengths of pool forward, backward  and side stepping. No pain noted retro walking as well ?  ?Runners stretch  2 x 30 sec on Left and then moves into hamstring stretch  Then runners stretch on R 2 x 30 sec  and then move into hamstring stret

## 2021-11-24 ENCOUNTER — Ambulatory Visit: Payer: Medicare Other | Admitting: Physical Therapy

## 2021-11-24 ENCOUNTER — Ambulatory Visit: Payer: Medicare Other | Admitting: Internal Medicine

## 2021-11-24 ENCOUNTER — Encounter: Payer: Self-pay | Admitting: Internal Medicine

## 2021-11-24 VITALS — BP 138/82 | Ht 63.0 in | Wt 137.0 lb

## 2021-11-24 DIAGNOSIS — E559 Vitamin D deficiency, unspecified: Secondary | ICD-10-CM

## 2021-11-24 DIAGNOSIS — M810 Age-related osteoporosis without current pathological fracture: Secondary | ICD-10-CM | POA: Diagnosis not present

## 2021-11-24 DIAGNOSIS — M6281 Muscle weakness (generalized): Secondary | ICD-10-CM

## 2021-11-24 DIAGNOSIS — G8929 Other chronic pain: Secondary | ICD-10-CM

## 2021-11-24 DIAGNOSIS — R600 Localized edema: Secondary | ICD-10-CM | POA: Diagnosis not present

## 2021-11-24 DIAGNOSIS — R262 Difficulty in walking, not elsewhere classified: Secondary | ICD-10-CM

## 2021-11-24 DIAGNOSIS — M25562 Pain in left knee: Secondary | ICD-10-CM | POA: Diagnosis not present

## 2021-11-24 LAB — VITAMIN D 25 HYDROXY (VIT D DEFICIENCY, FRACTURES): VITD: 42.8 ng/mL (ref 30.00–100.00)
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
## 2021-11-24 NOTE — Patient Instructions (Signed)
Please stop at the lab. ? ?Please schedule a new DXA scan at [facility]. ? ?Please come back for a follow-up appointment in 1 year. ? ?

## 2021-11-25 ENCOUNTER — Ambulatory Visit: Payer: Medicare Other | Admitting: Physical Therapy

## 2021-11-26 ENCOUNTER — Other Ambulatory Visit: Payer: Self-pay | Admitting: Internal Medicine

## 2021-11-26 ENCOUNTER — Telehealth: Payer: Self-pay

## 2021-11-26 ENCOUNTER — Encounter: Payer: Self-pay | Admitting: Internal Medicine

## 2021-11-26 NOTE — Telephone Encounter (Signed)
Pt called and lvm requesting a referral for done density scan for [facility]. ?

## 2021-11-26 NOTE — Telephone Encounter (Signed)
This order was placed at the time of her visit. ?

## 2021-11-29 NOTE — Therapy (Signed)
?OUTPATIENT PHYSICAL THERAPY TREATMENT NOTE/PROGRESS NOTE ? ?Progress Note ?Reporting Period 10-07-21 to 11-24-21 ? ?See note below for Objective Data and Assessment of Progress/Goals.  ? ?  ?Patient Name: Mary Garcia ?MRN: [id number] ?DOB:07-05-1946, 76 y.o., female ?Today's Date: 11/30/2021 ? ?PCP: Elby Showers, MD ?REFERRING PROVIDER: Elby Showers, MD ? ? PT End of Session - 11/30/21 0955   ? ? Visit Number 11   ? Number of Visits 16   ? Date for PT Re-Evaluation 12/09/21   ? Authorization Type [employer] MCR 6th visit FOTO  10 th visit FOTO   ? Progress Note Due on Visit 10   ? PT Start Time 1052   ? PT Stop Time 1120   ? PT Time Calculation (min) 28 min   ? Activity Tolerance Patient tolerated treatment well   ? Behavior During Therapy [facility] for tasks assessed/performed   ? ?  ?  ? ?  ? ? ? ? ? ? ? ? ? ? ? ?Past Medical History:  ?Diagnosis Date  ? Anxiety   ? Arthritis   ? Asthma   ? Bronchitis, acute   ? ?Past Surgical History:  ?Procedure Laterality Date  ? BREAST ENHANCEMENT SURGERY    ? CATARACT EXTRACTION W/ INTRAOCULAR LENS IMPLANT Bilateral   ? FACELIFT    ? local  ? ROTATOR CUFF REPAIR Right   ? TIBIA FRACTURE SURGERY  2004  ? left leg x3, tibia and fibula  ? TOTAL KNEE ARTHROPLASTY Left 10/18/2021  ? Procedure: TOTAL KNEE ARTHROPLASTY;  Surgeon: Vickey Huger, MD;  Location: [facility];  Service: Orthopedics;  Laterality: Left;  ? TUMOR EXCISION  2003  ? right muscle back, benign  ? ?Patient Active Problem List  ? Diagnosis Date Noted  ? Seasonal allergic rhinitis due to pollen 05/17/2021  ? Mild persistent asthma without complication 62/37/6283  ? Other allergic rhinitis 03/27/2019  ? DDD (degenerative disc disease), cervical 09/15/2017  ? Depression 02/02/2016  ? Arthropathy of right shoulder 02/02/2016  ? Insomnia 04/13/2011  ? Osteoporosis 04/13/2011  ? Biliary dyskinesia 04/13/2011  ? Osteoarthritis 04/13/2011  ? History of migraine headaches 04/13/2011  ? Vitamin D deficiency 04/13/2011  ? Anxiety 04/13/2011  ?  Irritable bowel syndrome 04/13/2011  ? ? ?REFERRING DIAG: S/P LT TKA,TIBIA ROD REMOVAL  ? ?THERAPY DIAG:  ?Chronic pain of left knee ? ?Muscle weakness (generalized) ? ?Difficulty in walking, not elsewhere classified ? ?Localized edema ? ?PERTINENT HISTORY: 19 years ago I fell and broke the Left lower leg and  rod/plates. Bronchitis, asthma OA ? ?PRECAUTIONS: TKA Left  ?SUBJECTIVE:  I am so busy getting ready to move.  I have been to see Buren Kos for personal training. And I have also been yoga and personal training. ? ?PAIN:  ? ?Are you having pain? Yes: NPRS scale: 0/10 ?Pain location: medial joint line ?Pain description: aching  ?Aggravating factors: bending the knee ?Relieving factors: heat on the thigh, ice on the knee, CBD cream ?2/10 over superior  pole of patella  ? ?OBJECTIVE:  ?  ?DIAGNOSTIC FINDINGS: IMPRESSION: 05/17/21 ?1. Nondepressed, nondisplaced subchondral insufficiency fracture of ?the medial tibial plateau with severe surrounding bone marrow edema. ?2.  Tricompartmental cartilage abnormalities as described above. ?3. Radial tear of the posterior horn of the medial meniscus with ?peripheral meniscal extrusion. ?  ?PATIENT SURVEYS:  ?FOTO 33% to 57% predicted ?  ?COGNITION: ?         Overall cognitive status: Within functional limits for tasks  assessed              ?          ?SENSATION: ?         Light touch: Appears intact ?         Stereognosis: Appears intact ?         Hot/Cold: Appears intact ?         Proprioception: Appears intact ?  ?MUSCLE LENGTH: ?Hamstrings:  WNL ?  ?  ?POSTURE:  ? [place] female with average build, forward head and rounded shld, valgus Left knee ?  ?PALPATION: ?TTP over medial aspect of Left knee ?  ?LE AROM/PROM: ?  ?A/PROM Right A/P ?10/07/2021 Left ?10/07/2021  Left  ?11-02-21 RIGHT ?11-23-21  Right ?11-30-21  ?Hip flexion 110 110     ?Hip extension         ?Hip abduction         ?Hip adduction         ?Hip internal rotation         ?Hip external rotation         ?Knee  flexion 136/140 130/138 painful 105/112 125 127  ?Knee extension 0 4 9 A/ PROM 5 from extension deficit 0 0  ?Ankle dorsiflexion         ?Ankle plantarflexion         ?Ankle inversion         ?Ankle eversion         ? (Blank rows = not tested) ?  ?LE MMT: ?  ?MMT Right ?10/07/2021 Left ?10/07/2021 Left ?11-02-21  ?Hip flexion '5 5 5  '$ ?Hip extension '5 5 5  '$ ?Hip abduction 5 4 4-  ?Hip adduction       ?Hip internal rotation       ?Hip external rotation       ?Knee flexion 5 4 4-  ?Knee extension 5 4+ pain 4-  ?Ankle dorsiflexion '5 5 5  '$ ?Ankle plantarflexion '5 5 5  '$ ?Ankle inversion       ?Ankle eversion       ? (Blank rows = not tested) ?  ?LOWER EXTREMITY SPECIAL TESTS:  ?NT ?  ?FUNCTIONAL TESTS:  ?5 times sit to stand: 16.3 sec ?11/03/2021 16.8 sec ?Squat - wt shift to R only able to squat ot 60 degree flexion ?  ?GAIT:  Pre - surgery for TKA ?Distance walked: 150 ?Assistive device utilized: None ?Level of assistance: Complete Independence ?Comments: Pt with valgus L knee and antalgic gait to unweight painful L LE ?  ?  ?  ?TODAY'S TREATMENT: ? ?[facility] Adult PT Treatment:                                                DATE: 11-30-21 ?  AROM 0- 127 ?Therapeutic Exercise: ?Sink squat x 20 ?Lunge on R x 10 and on L x 10 ?Heel raise with tennis ball x 30 ?Manual Therapy: ?Trigger Point Dry Needling Treatment: ?Pre-treatment instruction: Patient instructed on dry needling rationale, procedures, and possible side effects including pain during treatment (achy,cramping feeling), bruising, drop of blood, lightheadedness, nausea, sweating. ?Patient Consent Given: Yes ?Education handout provided: Yes ?Muscles treated: Left knee scar tissue Left quad ?Needle size and number: .30x37m x 2 ?Electrical stimulation performed: No ?Parameters: N/A ?Treatment response/outcome: Twitch response elicited and Palpable decrease  in muscle tension ?Post-treatment instructions: Patient instructed to expect possible mild to moderate muscle soreness  later today and/or tomorrow. Patient instructed in methods to reduce muscle soreness and to continue prescribed HEP. If patient was dry needled over the lung field, patient was instructed on signs and symptoms of pneumothorax and, however unlikely, to see immediate medical attention should they occur. Patient was also educated on signs and symptoms of infection and to seek medical attention should they occur. Patient verbalized understanding of these instructions and education.  ?Neuromuscular re-ed: ?SLS stance on floor Land R ?SLS stance on there ex pad L and R ?SLS around the [place] with 15 # KB ?SLS with side to side 15# KB ? ?[facility] Adult PT Treatment:                                                DATE: 11-24-21 ? ?   AROM Arc Left knee 0-129 post aquatics ?Aquatic therapy at [address] - therapeutic pool temp 92 degrees.  Pt enters building without AD.  Treatment took place in water 3.8 to  4 ft 8 in.feet deep depending upon activity.  Pt entered and exited the pool via stair and handrails independently.  ?  ?Runners stretch  2 x 30 sec on Left and then moves into hamstring stretch  Then runners stretch on R 2 x 30 sec  and then move into hamstring stretch . TC to insure proper technique. ? ? ?On edge of pool with bil UE support,  pt performed LE exercise  with 2 lb weights around ankle ?Hip abd/add R/L 20 x each and then using 1 UE support ?Hip ext/flex with knee straight x 15, pt needing VC and TC for correct execution and sequencing ?Marching knee/hip 90/90 x 10  ?Squats x 20 reps with intermittent UE support x 2 sets. ?SL squat on L and R ?Reverse Lunge ?On submerged step in waist deep water Forward step up and Step Down with L knee on step, then with R knee on step with forward step up and Step down  15 x each ? ?Ms [place]  ambulates in water by walking 12  lengths of pool forward, backward and side stepping. And lunge walking No pain noted retro walking as well ?Ascending and descending  pool steps 5 steps x 8.  ? ?Bicycle  for 3 continuous minutes with Yellow noodle and propelling self backwards  ? ?Supine abdominal hollowing with VC to keep hips up and feet up with barbells in UE submerg

## 2021-11-30 ENCOUNTER — Encounter: Payer: Self-pay | Admitting: Physical Therapy

## 2021-11-30 ENCOUNTER — Ambulatory Visit: Payer: Medicare Other | Admitting: Physical Therapy

## 2021-11-30 DIAGNOSIS — G8929 Other chronic pain: Secondary | ICD-10-CM

## 2021-11-30 DIAGNOSIS — R262 Difficulty in walking, not elsewhere classified: Secondary | ICD-10-CM

## 2021-11-30 DIAGNOSIS — R600 Localized edema: Secondary | ICD-10-CM

## 2021-11-30 DIAGNOSIS — M6281 Muscle weakness (generalized): Secondary | ICD-10-CM | POA: Diagnosis not present

## 2021-11-30 DIAGNOSIS — M25562 Pain in left knee: Secondary | ICD-10-CM | POA: Diagnosis not present

## 2021-11-30 NOTE — Telephone Encounter (Signed)
Form printed, signed, and faxed to [facility].  ?

## 2021-11-30 NOTE — Therapy (Signed)
?OUTPATIENT PHYSICAL THERAPY TREATMENT NOTE ? ? ? ?  ?Patient Name: Mary Garcia ?MRN: [id number] ?DOB:1945-12-11, 76 y.o., female ?Today's Date: 12/01/2021 ? ?PCP: Elby Showers, MD ?REFERRING PROVIDER: Vickey Huger, MD ? ? PT End of Session - 12/01/21 1311   ? ? Visit Number 12   ? Number of Visits 16   ? Date for PT Re-Evaluation 12/09/21   ? Authorization Type [employer] MCR 6th visit FOTO  10 th visit FOTO   ? Progress Note Due on Visit 10   ? PT Start Time 1325   ? PT Stop Time 1414   ? PT Time Calculation (min) 49 min   ? Activity Tolerance Patient tolerated treatment well   ? Behavior During Therapy [facility] for tasks assessed/performed   ? ?  ?  ? ?  ? ? ? ? ? ? ? ? ? ? ? ? ?Past Medical History:  ?Diagnosis Date  ? Anxiety   ? Arthritis   ? Asthma   ? Bronchitis, acute   ? ?Past Surgical History:  ?Procedure Laterality Date  ? BREAST ENHANCEMENT SURGERY    ? CATARACT EXTRACTION W/ INTRAOCULAR LENS IMPLANT Bilateral   ? FACELIFT    ? local  ? ROTATOR CUFF REPAIR Right   ? TIBIA FRACTURE SURGERY  2004  ? left leg x3, tibia and fibula  ? TOTAL KNEE ARTHROPLASTY Left 10/18/2021  ? Procedure: TOTAL KNEE ARTHROPLASTY;  Surgeon: Vickey Huger, MD;  Location: [facility];  Service: Orthopedics;  Laterality: Left;  ? TUMOR EXCISION  2003  ? right muscle back, benign  ? ?Patient Active Problem List  ? Diagnosis Date Noted  ? Seasonal allergic rhinitis due to pollen 05/17/2021  ? Mild persistent asthma without complication 23/53/6144  ? Other allergic rhinitis 03/27/2019  ? DDD (degenerative disc disease), cervical 09/15/2017  ? Depression 02/02/2016  ? Arthropathy of right shoulder 02/02/2016  ? Insomnia 04/13/2011  ? Osteoporosis 04/13/2011  ? Biliary dyskinesia 04/13/2011  ? Osteoarthritis 04/13/2011  ? History of migraine headaches 04/13/2011  ? Vitamin D deficiency 04/13/2011  ? Anxiety 04/13/2011  ? Irritable bowel syndrome 04/13/2011  ? ? ?REFERRING DIAG: S/P LT TKA,TIBIA ROD REMOVAL  ? ?THERAPY DIAG:  ?Chronic pain of left  knee ? ?Muscle weakness (generalized) ? ?Difficulty in walking, not elsewhere classified ? ?Localized edema ? ?PERTINENT HISTORY: 19 years ago I fell and broke the Left lower leg and  rod/plates. Bronchitis, asthma OA ? ?PRECAUTIONS: TKA Left  ?SUBJECTIVE: 0/10 in TKA,  slight pain in scar tissue proximal end ? ?PAIN:  ? ?Are you having pain? Yes: NPRS scale: 0/10 ?Pain location: medial joint line ?Pain description: aching  ?Aggravating factors: bending the knee ?Relieving factors: heat on the thigh, ice on the knee, CBD cream ?2/10 over superior  pole of patella  ? ?OBJECTIVE:  ?  ?DIAGNOSTIC FINDINGS: IMPRESSION: 05/17/21 ?1. Nondepressed, nondisplaced subchondral insufficiency fracture of ?the medial tibial plateau with severe surrounding bone marrow edema. ?2.  Tricompartmental cartilage abnormalities as described above. ?3. Radial tear of the posterior horn of the medial meniscus with ?peripheral meniscal extrusion. ?  ?PATIENT SURVEYS:  ?FOTO 33% to 57% predicted ?  ?COGNITION: ?         Overall cognitive status: Within functional limits for tasks assessed              ?          ?SENSATION: ?         Light touch: Appears  intact ?         Stereognosis: Appears intact ?         Hot/Cold: Appears intact ?         Proprioception: Appears intact ?  ?MUSCLE LENGTH: ?Hamstrings:  WNL ?  ?  ?POSTURE:  ? [place] female with average build, forward head and rounded shld, valgus Left knee ?  ?PALPATION: ?TTP over medial aspect of Left knee ?  ?LE AROM/PROM: ?  ?A/PROM Right A/P ?10/07/2021 Left ?10/07/2021  Left  ?11-02-21 RIGHT ?11-23-21  Right ?11-30-21  ?Hip flexion 110 110     ?Hip extension         ?Hip abduction         ?Hip adduction         ?Hip internal rotation         ?Hip external rotation         ?Knee flexion 136/140 130/138 painful 105/112 125 127  ?Knee extension 0 4 9 A/ PROM 5 from extension deficit 0 0  ?Ankle dorsiflexion         ?Ankle plantarflexion         ?Ankle inversion         ?Ankle eversion          ? (Blank rows = not tested) ?  ?LE MMT: ?  ?MMT Right ?10/07/2021 Left ?10/07/2021 Left ?11-02-21  ?Hip flexion '5 5 5  '$ ?Hip extension '5 5 5  '$ ?Hip abduction 5 4 4-  ?Hip adduction       ?Hip internal rotation       ?Hip external rotation       ?Knee flexion 5 4 4-  ?Knee extension 5 4+ pain 4-  ?Ankle dorsiflexion '5 5 5  '$ ?Ankle plantarflexion '5 5 5  '$ ?Ankle inversion       ?Ankle eversion       ? (Blank rows = not tested) ?  ?LOWER EXTREMITY SPECIAL TESTS:  ?NT ?  ?FUNCTIONAL TESTS:  ?5 times sit to stand: 16.3 sec ?11/03/2021 16.8 sec ?Squat - wt shift to R only able to squat ot 60 degree flexion ?  ?GAIT:  Pre - surgery for TKA ?Distance walked: 150 ?Assistive device utilized: None ?Level of assistance: Complete Independence ?Comments: Pt with valgus L knee and antalgic gait to unweight painful L LE ?  ?  ?  ?TODAY'S TREATMENT: ?[facility] Adult PT Treatment:                                                DATE: 12-01-21 ? ?Aquatic therapy at [address] - therapeutic pool temp 92 degrees ?Pt enters building without AD.  Treatment took place in water 3.8 to  4 ft 8 in.feet deep depending upon activity.  Pt entered and exited the pool via stair and handrails Independently  ?Pt pain level 0/10 at initiation of water walking.   ? ?Pt educated and demo HEP exercises below as outlined below ?Abdominal Curls Diagonal with Upper Extremity Flotation ?Seated Straddle on Noodle Reverse Breast Stroke Arms and Bicycle Legs ?Suspended [employer] Ski with Foam Dumbbells and Ankle Floats ?Jumping [place] Patient   ?Lunge to Target at [facility]  ?Hamstring Stretch with Noodle   ?Forward Backward Pendulum Swings with Hip Abduction and Adduction  ?Side to Side Pendulum Swing with Foam Dumbbells and Ankle  Floats  ?Plank on [employer] with Leg Lift  ?Seated Straddle on Noodle Reverse Breast Stroke Arms and Bicycle Legs ?Single Leg Suspended Side Plank on Long Hand Float   ?Seated Straddle on Noodle Reverse Breast Stroke Arms and   ?Hip abd/add R/L each and then using 1 UE support ?Hip ext/flex with knee straight  pt needing VC and TC for correct execution and sequencing ?Marching knee/hip 90/90  ?Marching with added pool noodle for increased resistance  ?Ham curl R/L  ?Standing gastroc stretch with great toe flexed at pool wall R and L  ?Squats  ?Runners stretch on R and L 30 sec x 2 and then stretch in to runners hamstring stretch 2 x 30 sec R and L ?Figure 4 squat stretch with UE support for R and L x 60 sec each  ?Supine abdominal hollowing with VC to keep hips up and feet up with  yellow barbells in UE submerged ?prone aquatic  plank with barbell submerged for abdominal engagement, difficulty keeping hip elevated ?SLS stance 30 sec hold x 5 L and R ?Heel raises x 30 ?Squat x 20 holding onto pool ledge as deeply as possible ? ?Scar tissue massage in water accompanied with aqua Stretch and with elevated rear foot lunge on Left for added knee flexion to end session ? ?Pt requires the buoyancy of water for active assisted exercises with buoyancy supported for strengthening and AROM exercises: PT  requires the viscosity of the water for resistance with strengthening exercises ?Water will allow for  reduced joint loading through buoyancy to help patient improve posture without excess stress and pain. ? ? ? ?[facility] Adult PT Treatment:                                                DATE: 11-30-21 ?  AROM 0- 127 ?Therapeutic Exercise: ?Sink squat x 20 ?Lunge on R x 10 and on L x 10 ?Heel raise with tennis ball x 30 ?Manual Therapy: ?Trigger Point Dry Needling Treatment: ?Pre-treatment instruction: Patient instructed on dry needling rationale, procedures, and possible side effects including pain during treatment (achy,cramping feeling), bruising, drop of blood, lightheadedness, nausea, sweating. ?Patient Consent Given: Yes ?Education handout provided: Yes ?Muscles treated: Left knee scar tissue Left quad ?Needle size and number: .30x27m x  2 ?Electrical stimulation performed: No ?Parameters: N/A ?Treatment response/outcome: Twitch response elicited and Palpable decrease in muscle tension ?Post-treatment instructions: Patient instructed to expect possibl

## 2021-11-30 NOTE — Therapy (Deleted)
?OUTPATIENT PHYSICAL THERAPY TREATMENT NOTE/PROGRESS NOTE ? ?Progress Note ?Reporting Period 10-07-21 to 11-24-21 ? ?See note below for Objective Data and Assessment of Progress/Goals.  ? ?  ?Patient Name: Mary Garcia ?MRN: [id number] ?DOB:Jan 01, 1946, 76 y.o., female ?Today's Date: 11/30/2021 ? ?PCP: Elby Showers, MD ?REFERRING PROVIDER: Vickey Huger, MD ? ? ? ? ? ? ? ? ? ? ? ? ? ?Past Medical History:  ?Diagnosis Date  ? Anxiety   ? Arthritis   ? Asthma   ? Bronchitis, acute   ? ?Past Surgical History:  ?Procedure Laterality Date  ? BREAST ENHANCEMENT SURGERY    ? CATARACT EXTRACTION W/ INTRAOCULAR LENS IMPLANT Bilateral   ? FACELIFT    ? local  ? ROTATOR CUFF REPAIR Right   ? TIBIA FRACTURE SURGERY  2004  ? left leg x3, tibia and fibula  ? TOTAL KNEE ARTHROPLASTY Left 10/18/2021  ? Procedure: TOTAL KNEE ARTHROPLASTY;  Surgeon: Vickey Huger, MD;  Location: [facility];  Service: Orthopedics;  Laterality: Left;  ? TUMOR EXCISION  2003  ? right muscle back, benign  ? ?Patient Active Problem List  ? Diagnosis Date Noted  ? Seasonal allergic rhinitis due to pollen 05/17/2021  ? Mild persistent asthma without complication 35/00/9381  ? Other allergic rhinitis 03/27/2019  ? DDD (degenerative disc disease), cervical 09/15/2017  ? Depression 02/02/2016  ? Arthropathy of right shoulder 02/02/2016  ? Insomnia 04/13/2011  ? Osteoporosis 04/13/2011  ? Biliary dyskinesia 04/13/2011  ? Osteoarthritis 04/13/2011  ? History of migraine headaches 04/13/2011  ? Vitamin D deficiency 04/13/2011  ? Anxiety 04/13/2011  ? Irritable bowel syndrome 04/13/2011  ? ? ?REFERRING DIAG: S/P LT TKA,TIBIA ROD REMOVAL  ? ?THERAPY DIAG:  ?No diagnosis found. ? ?PERTINENT HISTORY: 19 years ago I fell and broke the Left lower leg and  rod/plates. Bronchitis, asthma OA ? ?PRECAUTIONS: TKA Left  ?SUBJECTIVE:  I am so busy getting ready to move.  I have been to see Buren Kos for personal training. And I have also been yoga and personal training. ? ?PAIN:  ? ?Are  you having pain? Yes: NPRS scale: 0/10 ?Pain location: medial joint line ?Pain description: aching  ?Aggravating factors: bending the knee ?Relieving factors: heat on the thigh, ice on the knee, CBD cream ?2/10 over superior  pole of patella  ? ?OBJECTIVE:  ?  ?DIAGNOSTIC FINDINGS: IMPRESSION: 05/17/21 ?1. Nondepressed, nondisplaced subchondral insufficiency fracture of ?the medial tibial plateau with severe surrounding bone marrow edema. ?2.  Tricompartmental cartilage abnormalities as described above. ?3. Radial tear of the posterior horn of the medial meniscus with ?peripheral meniscal extrusion. ?  ?PATIENT SURVEYS:  ?FOTO 33% to 57% predicted ?  ?COGNITION: ?         Overall cognitive status: Within functional limits for tasks assessed              ?          ?SENSATION: ?         Light touch: Appears intact ?         Stereognosis: Appears intact ?         Hot/Cold: Appears intact ?         Proprioception: Appears intact ?  ?MUSCLE LENGTH: ?Hamstrings:  WNL ?  ?  ?POSTURE:  ? [place] female with average build, forward head and rounded shld, valgus Left knee ?  ?PALPATION: ?TTP over medial aspect of Left knee ?  ?LE AROM/PROM: ?  ?A/PROM Right A/P ?10/07/2021 Left ?  10/07/2021  Left  ?11-02-21 RIGHT ?11-23-21  Right ?11-30-21  ?Hip flexion 110 110     ?Hip extension         ?Hip abduction         ?Hip adduction         ?Hip internal rotation         ?Hip external rotation         ?Knee flexion 136/140 130/138 painful 105/112 125 127  ?Knee extension 0 4 9 A/ PROM 5 from extension deficit 0 0  ?Ankle dorsiflexion         ?Ankle plantarflexion         ?Ankle inversion         ?Ankle eversion         ? (Blank rows = not tested) ?  ?LE MMT: ?  ?MMT Right ?10/07/2021 Left ?10/07/2021 Left ?11-02-21  ?Hip flexion '5 5 5  '$ ?Hip extension '5 5 5  '$ ?Hip abduction 5 4 4-  ?Hip adduction       ?Hip internal rotation       ?Hip external rotation       ?Knee flexion 5 4 4-  ?Knee extension 5 4+ pain 4-  ?Ankle dorsiflexion '5 5 5  '$ ?Ankle  plantarflexion '5 5 5  '$ ?Ankle inversion       ?Ankle eversion       ? (Blank rows = not tested) ?  ?LOWER EXTREMITY SPECIAL TESTS:  ?NT ?  ?FUNCTIONAL TESTS:  ?5 times sit to stand: 16.3 sec ?11/03/2021 16.8 sec ?Squat - wt shift to R only able to squat ot 60 degree flexion ?  ?GAIT:  Pre - surgery for TKA ?Distance walked: 150 ?Assistive device utilized: None ?Level of assistance: Complete Independence ?Comments: Pt with valgus L knee and antalgic gait to unweight painful L LE ?  ?  ?  ?TODAY'S TREATMENT: ? ?[facility] Adult PT Treatment:                                                DATE: 11-30-21 ?  AROM 0- 127 ?Therapeutic Exercise: ?Sink squat x 20 ?Lunge on R x 10 and on L x 10 ?Heel raise with tennis ball x 30 ?Manual Therapy: ?Trigger Point Dry Needling Treatment: ?Pre-treatment instruction: Patient instructed on dry needling rationale, procedures, and possible side effects including pain during treatment (achy,cramping feeling), bruising, drop of blood, lightheadedness, nausea, sweating. ?Patient Consent Given: Yes ?Education handout provided: Yes ?Muscles treated: Left knee scar tissue Left quad ?Needle size and number: .30x73m x 2 ?Electrical stimulation performed: No ?Parameters: N/A ?Treatment response/outcome: Twitch response elicited and Palpable decrease in muscle tension ?Post-treatment instructions: Patient instructed to expect possible mild to moderate muscle soreness later today and/or tomorrow. Patient instructed in methods to reduce muscle soreness and to continue prescribed HEP. If patient was dry needled over the lung field, patient was instructed on signs and symptoms of pneumothorax and, however unlikely, to see immediate medical attention should they occur. Patient was also educated on signs and symptoms of infection and to seek medical attention should they occur. Patient verbalized understanding of these instructions and education.  ?Neuromuscular re-ed: ?SLS stance on floor Land R ?SLS stance  on there ex pad L and R ?SLS around the world with 15 # KB ?SLS with side to side 15# KB ? ?  [facility] Adult PT Treatment:                                                DATE: 11-24-21 ? ?   AROM Arc Left knee 0-129 post aquatics ?Aquatic therapy at [address] - therapeutic pool temp 92 degrees.  Pt enters building without AD.  Treatment took place in water 3.8 to  4 ft 8 in.feet deep depending upon activity.  Pt entered and exited the pool via stair and handrails independently.  ?  ?Runners stretch  2 x 30 sec on Left and then moves into hamstring stretch  Then runners stretch on R 2 x 30 sec  and then move into hamstring stretch . TC to insure proper technique. ? ? ?On edge of pool with bil UE support,  pt performed LE exercise  with 2 lb weights around ankle ?Hip abd/add R/L 20 x each and then using 1 UE support ?Hip ext/flex with knee straight x 15, pt needing VC and TC for correct execution and sequencing ?Marching knee/hip 90/90 x 10  ?Squats x 20 reps with intermittent UE support x 2 sets. ?SL squat on L and R ?Reverse Lunge ?On submerged step in waist deep water Forward step up and Step Down with L knee on step, then with R knee on step with forward step up and Step down  15 x each ? ?Ms Bonus  ambulates in water by walking 12  lengths of pool forward, backward and side stepping. And lunge walking No pain noted retro walking as well ?Ascending and descending pool steps 5 steps x 8.  ? ?Bicycle  for 3 continuous minutes with Yellow noodle and propelling self backwards  ? ?Supine abdominal hollowing with VC to keep hips up and feet up with barbells in UE submerged as much as possible in 8 minutes   ?  ?Pt requires the buoyancy of water for active assisted exercises with buoyancy supported for strengthening and AROM exercises: PT  requires the viscosity of the water for resistance with strengthening exercises ?Water will allow for  reduced joint loading through buoyancy to help patient improve  posture without excess stress and pain. ? ?[facility] Adult PT Treatment:                                                DATE: 11-23-21 ?AROM total arc(before exercise  0 - 125 ? ?Therapeutic Exercise: ?Tall plan

## 2021-11-30 NOTE — Patient Instructions (Signed)
? ?  Trigger Point Dry Needling ? ?What is Trigger Point Dry Needling (DN)? ?DN is a physical therapy technique used to treat muscle pain and dysfunction. Specifically, DN helps deactivate muscle trigger points (muscle knots).  ?A thin filiform needle is used to penetrate the skin and stimulate the underlying trigger point. The goal is for a local twitch response (LTR) to occur and for the trigger point to relax. No medication of any kind is injected during the procedure.  ? ?What Does Trigger Point Dry Needling Feel Like?  ?The procedure feels different for each individual patient. Some patients report that they do not actually feel the needle enter the skin and overall the process is not painful. Very mild bleeding may occur. However, many patients feel a deep cramping in the muscle in which the needle was inserted. This is the local twitch response.  ? ?How Will I feel after the treatment? ?Soreness is normal, and the onset of soreness may not occur for a few hours. Typically this soreness does not last longer than two days.  ?Bruising is uncommon, however; ice can be used to decrease any possible bruising.  ?In rare cases feeling tired or nauseous after the treatment is normal. In addition, your symptoms may get worse before they get better, this period will typically not last longer than 24 hours.  ? ?What Can I do After My Treatment? ?Increase your hydration by drinking more water for the next 24 hours. ?You may place ice or heat on the areas treated that have become sore, however, do not use heat on inflamed or bruised areas. Heat often brings more relief post needling. ?You can continue your regular activities, but vigorous activity is not recommended initially after the treatment for 24 hours. ?DN is best combined with other physical therapy such as strengthening, stretching, and other therapies.   ?Voncille Lo, PT, [place] ?Certified Exercise Expert for the Aging Adult  ?11/30/21 10:10 AM ?Phone:  [phone] ?Fax: [phone]  ? ?

## 2021-12-01 ENCOUNTER — Encounter: Payer: Self-pay | Admitting: Physical Therapy

## 2021-12-01 ENCOUNTER — Ambulatory Visit: Payer: Medicare Other | Admitting: Physical Therapy

## 2021-12-01 DIAGNOSIS — R600 Localized edema: Secondary | ICD-10-CM

## 2021-12-01 DIAGNOSIS — M6281 Muscle weakness (generalized): Secondary | ICD-10-CM

## 2021-12-01 DIAGNOSIS — R262 Difficulty in walking, not elsewhere classified: Secondary | ICD-10-CM

## 2021-12-01 DIAGNOSIS — G8929 Other chronic pain: Secondary | ICD-10-CM | POA: Diagnosis not present

## 2021-12-01 DIAGNOSIS — M25562 Pain in left knee: Secondary | ICD-10-CM | POA: Diagnosis not present

## 2021-12-01 NOTE — Patient Instructions (Signed)
Aquatic ?Access Code: [email or web address] ?URL: [email or web address] ?Date: 12/01/2021 ?Prepared by: Voncille Lo ? ?Exercises ?- Abdominal Curls Diagonal with Upper Extremity Flotation  - 1 x daily - 7 x weekly - 3 sets - 10 reps ?- Seated Straddle on Noodle Reverse Breast Stroke Arms and Bicycle Legs  - 1 x daily - 7 x weekly - 3 sets - 10 reps ?- Rhodell Ski with Foam Dumbbells and Ankle Floats  - 1 x daily - 7 x weekly - 3 sets - 10 reps ?- Jumping Jacks  - 1 x daily - 7 x weekly - 3 sets - 10 reps ?- Lunge to [facility]  - 1 x daily - 7 x weekly - 3 sets - 10 reps ?- Hamstring Stretch with Noodle  - 1 x daily - 7 x weekly - 3 sets - 10 reps ?- Forward Backward Pendulum Swings with Hip Abduction and Adduction  - 1 x daily - 7 x weekly - 3 sets - 10 reps ?- Side to Side Pendulum Swing with Foam Dumbbells and Ankle Floats  - 1 x daily - 7 x weekly - 3 sets - 10 reps ?Rudean Haskell on [employer] with Leg Lift  - 1 x daily - 7 x weekly - 3 sets - 10 reps ?- Single Leg Suspended Side Plank on Long Hand Float  - 1 x daily - 7 x weekly - 3 sets - 10 reps ?- Seated Straddle on Noodle Reverse Breast Stroke Arms and Bicycle Legs  - 1 x daily - 7 x weekly - 3 sets - 10 reps ?- Bryan Ski with Foam Dumbbells and Ankle Floats  - 1 x daily - 7 x weekly - 3 sets - 10 reps ? ?Aquatics Home Program  ?Pool Written Home Exercise ?All exercises you will feel a stretch but should be PAIN FREE ?Be aware of neutral spine/Water immersion requires continuous muscle activation with static positioning. You need to be developing muscle endurance - ability to do work over a longer period of time. ? ?1) sitting hip hinge  ?Sit in waist deep  water  ?Bend at hips with chest up( show shirt logo) and look up/ chin down ?Bend forward as far as possible hold 5 sec  x15 ? ?2) standing hip hinge  ?Stand about a foot from wall ?Cross arms in front of you like holding groceries and shutting car door  with buttocks  ?Try to touch wall with buttocks  ?Repeat x15  ? ?3)  Runner's stretch. use steps in pool and hold onto rail as needed, place right foot on step and bend right knee as far as you can go. This will stretch your left hip flexors as well.  Hold about 10-15 sec and repeat 5 x on Right leg. ?Repeat with opposite leg ?4) Follow with hamstring stretch on step as shown in clinic x 5 and hold 15-30 sec ? ?5) Use ball / floating weights/ noodle to press down in water to increase abdominal engagement while walking through the shallow water ?Home Program has Triangle pose and  Cat Cow with Noodle ? ?Now, Begin with walking back and forth in water increasing speed to increase strength. ?Go to pool ledge and perform exercises to warm up ?Marching in place x 20,  ?With knee straight kick leg forward and backward 20 x (Leg flex/ext) Remember to keep core quiet and engaged as shown in clinic. ? With knee straight kick leg across  body(leading with heel) and away from body (to the side and back and return to across your body as shown in aquatic therapy x 20. Remember to keep core quiet and engaged. ?Standing by pool ledge,(hamstrings curl) bend knee (as if you are kicking your buttock with your heel) x 20 ?Heel raises x 30 ?Squat x 20 holding onto pool ledge as deeply as possible ?Single limb x 3 for 30 sec each to work on balance without holding onto pool ledge ? ? ?Voncille Lo, PT, [facility] ?Certified Exercise Expert for the Aging Adult  ?12/01/21 12:19 PM ?Phone: [phone] ?Fax: [phone]  ?

## 2021-12-02 ENCOUNTER — Ambulatory Visit: Payer: Medicare Other | Admitting: Physical Therapy

## 2021-12-07 ENCOUNTER — Encounter: Payer: Medicare Other | Admitting: Physical Therapy

## 2021-12-07 NOTE — Progress Notes (Signed)
? ?Follow Up Note ? ?RE: Mary Garcia MRN: [id number] DOB: 01/29/1946 ?Date of Office Visit: 12/08/2021 ? ?Referring provider: Elby Showers, MD ?Primary care provider: Elby Showers, MD ? ?Chief Complaint: Asthma (Been great. Small flare up in January 2023.) and Allergic Rhinitis  (Good. Haven't had any issues.) ? ?History of Present Illness: ?I had the pleasure of seeing Mary Garcia for a follow up visit at the [facility] on 12/08/2021. She is a 76 y.o. female, who is being followed for asthma, allergic rhinitis. Her previous allergy office visit was on 09/13/2021 with Mary Charon, FNP. Today is a regular follow up visit. ? ?Asthma  ?Doing well since the last visit. ?Currently on Symbicort 90mg 2 puffs once a day. ?Denies any ER/urgent care visits or prednisone use since the last visit. ?Uses albuterol on rare occasions.  ? ?Allergic rhinitis ?Asymptomatic with no medications. ? ?Had knee surgery in March and doing well.  ? ?Assessment and Plan: ?SSandaris a 76y.o. female with: ?Mild persistent asthma without complication ?Past history - Issues with coughing, wheezing and SOB for the past 3-4 years with worsening during URIs. Used to be on Flovent, Qvar in the past. Tried PPIs in the past with no benefit. 2020 skin testing showed: Borderline positive to maple tree pollen. 2020 spirometry showed: mild possible restrictive disease with 14% improvement in FEV post bronchodilator treatment.  ?Interim history - only flare she had was in January requiring oral prednisone.  ?Today's spirometry showed some mild obstruction.  ?Daily controller medication(s): Symbicort 892m 2 puffs once a day with spacer and rinse mouth afterwards. ?May need to use twice a day during the fall/winter season.  ?May use albuterol rescue inhaler 2 puffs or nebulizer every 4 to 6 hours as needed for shortness of breath, chest tightness, coughing, and wheezing. May use albuterol rescue inhaler 2 puffs 5 to 15 minutes prior to  strenuous physical activities. Monitor frequency of use.  ?During upper respiratory infections/asthma flares: Start Symbicort 8079m2 puffs twice a day for 1-2 weeks. ?Patient [facility]lick [facility].  ?If you run out of the proair respiclick let me know and we can try the proair digihaler next.  ?Get spirometry at next visit. ? ?Seasonal allergic rhinitis due to pollen ?Past history - Perennial rhinitis symptoms for the past 8 years. 2020 skin testing was borderline positive to tree pollen.  ?Interim history - asymptomatic with no medications. ?Continue environmental control measures.  ?May use azelastine nasal spray 1-2 sprays per nostril 1-2 times a day as needed for runny nose. ?May use over the counter antihistamines such as Zyrtec (cetirizine), Claritin (loratadine), Allegra (fexofenadine), or Xyzal (levocetirizine) daily as needed. May take twice a day during flares.  ? ?Return in about 6 months (around 06/09/2022). ? ?No orders of the defined types were placed in this encounter. ? ?Lab Orders  ?No laboratory test(s) ordered today  ? ? ?Diagnostics: ?Spirometry:  ?Tracings reviewed. Her effort: Good reproducible efforts. ?FVC: 2.10L ?FEV1: 1.42L, 72% predicted ?FEV1/FVC ratio: 68% ?Interpretation: Spirometry consistent with mild obstructive disease.  ?Please see scanned spirometry results for details. ? ?Medication List:  ?Current Outpatient Medications  ?Medication Sig Dispense Refill  ? Albuterol Sulfate (PROAIR RESPICLICK) 1089370 Base) MCG/ACT AEPB Inhale 2 puffs into the lungs every 4 (four) hours as needed (coughing, wheezing, shorntess of breath). 1 each 1  ? ALPRAZolam (XANAX) 0.5 MG tablet One half to one tab twice daily as needed  for anxiety. 60 tablet 0  ? azelastine (ASTELIN) 0.1 % nasal spray Place 1-2 sprays in each nostril one to two times a day as needed for runny nose/drainage down throat 30 mL 5  ? budesonide-formoterol (SYMBICORT) 80-4.5 MCG/ACT inhaler Inhale 2 puffs  into the lungs 2 (two) times daily. with spacer and rinse mouth afterwards. 11 g 5  ? Cholecalciferol (VITAMIN D) 50 MCG (2000 UT) tablet Take 2,000 Units by mouth 2 (two) times daily.    ? ELDERBERRY PO Take 575 mg by mouth 2 (two) times daily.    ? ibandronate (BONIVA) 150 MG tablet Take 1 tablet (150 mg total) by mouth every 30 (thirty) days. Take in the morning with a full glass of water, on an empty stomach, and do not take anything else by mouth or lie down for the next 30 min. 3 tablet 3  ? ketoconazole (NIZORAL) 2 % cream Apply 1 application topically 2 (two) times daily as needed (rash).    ? tretinoin (RETIN-A) 0.1 % cream Apply 1 application topically at bedtime.    ? zolpidem (AMBIEN) 10 MG tablet TAKE 1 TABLET BY MOUTH EVERY DAY AT BEDTIME AS NEEDED FOR SLEEP (Patient taking differently: Take 5 mg by mouth at bedtime.) 90 tablet 1  ? Multiple Minerals-Vitamins (CITRACAL MAXIMUM PLUS) TABS Take 1 tablet by mouth daily. (Patient not taking: Reported on 12/08/2021)    ? ?No current facility-administered medications for this visit.  ? ?Allergies: ?No Known Allergies ?I reviewed her past medical history, social history, family history, and environmental history and no significant changes have been reported from her previous visit. ? ?Review of Systems  ?Constitutional:  Negative for appetite change, chills, fever and unexpected weight change.  ?HENT:  Negative for congestion, postnasal drip and rhinorrhea.   ?Eyes:  Negative for itching.  ?Respiratory:  Negative for cough, chest tightness, shortness of breath and wheezing.   ?Cardiovascular:  Negative for chest pain.  ?Gastrointestinal:  Negative for abdominal pain.  ?Genitourinary:  Negative for difficulty urinating.  ?Skin:  Negative for rash.  ?Allergic/Immunologic: Positive for environmental allergies. Negative for food allergies.  ?Neurological:  Negative for headaches.  ? ?Objective: ?BP 128/72   Pulse (!) 111   Temp 97.6 ?F (36.4 ?C) (Temporal)    Resp 12   Ht 5' 2.5" (1.588 m)   Wt 135 lb 12.8 oz (61.6 kg)   SpO2 99%   BMI 24.44 kg/m?  ?Body mass index is 24.44 kg/m?Marland Kitchen ?Physical Exam ?Vitals and nursing note reviewed.  ?Constitutional:   ?   Appearance: Normal appearance. She is well-developed.  ?HENT:  ?   Head: Normocephalic and atraumatic.  ?   Right Ear: Tympanic membrane and external ear normal.  ?   Left Ear: Tympanic membrane and external ear normal.  ?   Nose: Nose normal.  ?   Mouth/Throat:  ?   Mouth: Mucous membranes are moist.  ?   Pharynx: Oropharynx is clear.  ?Eyes:  ?   Conjunctiva/sclera: Conjunctivae normal.  ?Cardiovascular:  ?   Rate and Rhythm: Normal rate and regular rhythm.  ?   Heart sounds: Normal heart sounds. No murmur heard. ?  No friction rub. No gallop.  ?Pulmonary:  ?   Effort: Pulmonary effort is normal.  ?   Breath sounds: Normal breath sounds. No wheezing or rales.  ?Musculoskeletal:  ?   Cervical back: Neck supple.  ?Skin: ?   General: Skin is warm.  ?   Findings: No rash.  ?  Neurological:  ?   Mental Status: She is alert and oriented to person, place, and time.  ?Psychiatric:     ?   Mood and Affect: Mood normal.     ?   Behavior: Behavior normal.  ? ?Previous notes and tests were reviewed. ?The plan was reviewed with the patient/family, and all questions/concerned were addressed. ? ?It was my pleasure to see Orabelle today and participate in her care. Please feel free to contact me with any questions or concerns. ? ?Sincerely, ? ?Rexene Alberts, DO ?Allergy & Immunology ? ?[facility] ?[place] office: [phone] ?[place] office: [phone] ?

## 2021-12-07 NOTE — Telephone Encounter (Signed)
Pt called to advise fax was not received. Requested form be faxed. Form faxed with previous confirmation sheet to [facility]. ?

## 2021-12-07 NOTE — Therapy (Signed)
?OUTPATIENT PHYSICAL THERAPY TREATMENT NOTE/DISCHARGE NOTE ? ?PHYSICAL THERAPY DISCHARGE SUMMARY ? ?Visits from Start of Care: 13 ? ?Current functional level related to goals / functional outcomes: ?As indicated below ?  ?Remaining deficits: ?none ?  ?Education / Equipment: ?HEP for land and aquatics  ? ?Patient agrees to discharge. Patient goals were met. Patient is being discharged due to meeting the stated rehab goals.  ? ? ? ?  ?Patient Name: Mary Garcia ?MRN: [id number] ?DOB:1946-04-30, 76 y.o., female ?Today's Date: 12/09/2021 ? ?PCP: Elby Showers, MD ?REFERRING PROVIDER: Vickey Huger, MD ? ? PT End of Session - 12/09/21 0935   ? ? Visit Number 13   ? Number of Visits 16   ? Date for PT Re-Evaluation 12/09/21   ? Authorization Type BCBS MCR 6th visit FOTO  10 th visit FOTO   ? Progress Note Due on Visit 10   ? PT Start Time [id number]   ? PT Stop Time 1013   ? PT Time Calculation (min) 40 min   ? Activity Tolerance Patient tolerated treatment well   ? Behavior During Therapy [facility] for tasks assessed/performed   ? ?  ?  ? ?  ? ? ? ? ? ? ? ? ? ? ? ? ? ?Past Medical History:  ?Diagnosis Date  ? Anxiety   ? Arthritis   ? Asthma   ? Bronchitis, acute   ? ?Past Surgical History:  ?Procedure Laterality Date  ? BREAST ENHANCEMENT SURGERY    ? CATARACT EXTRACTION W/ INTRAOCULAR LENS IMPLANT Bilateral   ? FACELIFT    ? local  ? ROTATOR CUFF REPAIR Right   ? TIBIA FRACTURE SURGERY  2004  ? left leg x3, tibia and fibula  ? TOTAL KNEE ARTHROPLASTY Left 10/18/2021  ? Procedure: TOTAL KNEE ARTHROPLASTY;  Surgeon: Vickey Huger, MD;  Location: [facility];  Service: Orthopedics;  Laterality: Left;  ? TUMOR EXCISION  2003  ? right muscle back, benign  ? ?Patient Active Problem List  ? Diagnosis Date Noted  ? Seasonal allergic rhinitis due to pollen 05/17/2021  ? Mild persistent asthma without complication 47/04/6282  ? Other allergic rhinitis 03/27/2019  ? DDD (degenerative disc disease), cervical 09/15/2017  ? Depression 02/02/2016  ?  Arthropathy of right shoulder 02/02/2016  ? Insomnia 04/13/2011  ? Osteoporosis 04/13/2011  ? Biliary dyskinesia 04/13/2011  ? Osteoarthritis 04/13/2011  ? History of migraine headaches 04/13/2011  ? Vitamin D deficiency 04/13/2011  ? Anxiety 04/13/2011  ? Irritable bowel syndrome 04/13/2011  ? ? ?REFERRING DIAG: S/P LT TKA,TIBIA ROD REMOVAL  ? ?THERAPY DIAG:  ?Chronic pain of left knee ? ?Muscle weakness (generalized) ? ?Difficulty in walking, not elsewhere classified ? ?Localized edema ? ?PERTINENT HISTORY: 19 years ago I fell and broke the Left lower leg and  rod/plates. Bronchitis, asthma OA ? ?PRECAUTIONS: TKA Left  ?SUBJECTIVE:   I have no pain.  I was able to go up and down the steps and I didn't even think about it.  I feel great. ? ?PAIN:  ? ?Are you having pain? Yes: NPRS scale: 0/10 ?Pain location: medial joint line ?Pain description: aching  ?Aggravating factors: bending the knee ?Relieving factors: heat on the thigh, ice on the knee, CBD cream ?2/10 over superior  pole of patella  ? ?OBJECTIVE:  ?  ?DIAGNOSTIC FINDINGS: IMPRESSION: 05/17/21 ?1. Nondepressed, nondisplaced subchondral insufficiency fracture of ?the medial tibial plateau with severe surrounding bone marrow edema. ?2.  Tricompartmental cartilage abnormalities as described above. ?3.  Radial tear of the posterior horn of the medial meniscus with ?peripheral meniscal extrusion. ?  ?PATIENT SURVEYS:  ?FOTO 33% to 57% predicted ?12-09-21  75% ?  ?COGNITION: ?         Overall cognitive status: Within functional limits for tasks assessed              ?          ?SENSATION: ?         Light touch: Appears intact ?         Stereognosis: Appears intact ?         Hot/Cold: Appears intact ?         Proprioception: Appears intact ?  ?MUSCLE LENGTH: ?Hamstrings:  WNL ?  ?  ?POSTURE:  ? [place] female with average build, forward head and rounded shld, valgus Left knee ?  ?PALPATION: ?TTP over medial aspect of Left knee ?  ?LE AROM/PROM: ?  ?A/PROM Right  A/P ?10/07/2021 Left ?10/07/2021  Left  ?11-02-21 RIGHT ?11-23-21  Right ?11-30-21 RIGHT ?12-09-21  ?Hip flexion 110 110    132  ?Hip extension          ?Hip abduction          ?Hip adduction          ?Hip internal rotation          ?Hip external rotation          ?Knee flexion 136/140 130/138 painful 105/112 125 127 135  ?Knee extension 0 4 9 A/ PROM 5 from extension deficit 0 0 0  ?Ankle dorsiflexion          ?Ankle plantarflexion          ?Ankle inversion          ?Ankle eversion          ? (Blank rows = not tested) ?  ?LE MMT: ?  ?MMT Right ?10/07/2021 Left ?10/07/2021 Left ?11-02-21 LEFT ?12-09-21  ?Hip flexion _0 ?Hip extension _1 ?Hip abduction 5 4 4- 5  ?Hip adduction        ?Hip internal rotation        ?Hip external rotation        ?Knee flexion 5 4 4- 5  ?Knee extension 5 4+ pain 4- 5  ?Ankle dorsiflexion _2 ?Ankle plantarflexion _3 ?Ankle inversion        ?Ankle eversion        ? (Blank rows = not tested) ?  ?LOWER EXTREMITY SPECIAL TESTS:  ?NT ?  ?FUNCTIONAL TESTS:  ?5 times sit to stand: 16.3 sec ?11/03/2021 16.8 sec ?12-09-21  9.14 sec ?Squat - wt shift to R only able to squat ot 60 degree flexion ?Squat while holding to PT below horizontal,  pt with 25 3 KB able to squat with  25 # KB, Pt tends to pitch forward on torso but is able to have good AROM on knees. Equal wt bearing ?  ?GAIT:  Pre - surgery for TKA ?Distance walked: 150 ?Assistive device utilized: None ?Level of assistance: Complete Independence ?Comments: Pt with valgus L knee and antalgic gait to unweight painful L LE ?  ?  ?  ?TODAY'S TREATMENT: ? ?[place] Adult PT Treatment:  DATE: 12-09-21 ?Therapeutic Exercise: ?Standing Terminal Knee Extension at Wall with Ball  2 x 10 ?X Band Walk  2 x 40 feet ?Squat with Counter Support  3 x 10 ?[place] Deadlift with Dumbbells just past Knees ONLY with 25 # KB   ?Standing Hip Hinge 1 x 10 against wall ?Goblet Squat with Kettlebell  2 x  10 ?Standing Sports administrator in [employer] Position 1 x 10 on R and L holding onto chair in front  ? Supine Hamstring Curl on Swiss Ball  2 x 10 ? ? ? ?[facility] Adult PT Treatment:                                                DATE: 12-01-21 ? ?Aquatic therapy at [address] - therapeutic pool temp 92 degrees ?Pt enters building without AD.  Treatment took place in water 3.8 to  4 ft 8 in.feet deep depending upon activity.  Pt entered and exited the pool via stair and handrails Independently  ?Pt pain level 0/10 at initiation of water walking.   ? ?Pt educated and demo HEP exercises below as outlined below ?Abdominal Curls Diagonal with Upper Extremity Flotation ?Seated Straddle on Noodle Reverse Breast Stroke Arms and Bicycle Legs ?Suspended [employer] Ski with Foam Dumbbells and Ankle Floats ?Jumping [place] Patient   ?Lunge to Target at [facility]  ?Hamstring Stretch with Noodle   ?Forward Backward Pendulum Swings with Hip Abduction and Adduction  ?Side to Side Pendulum Swing with Foam Dumbbells and Ankle Floats  ?Plank on [employer] with Leg Lift  ?Seated Straddle on Noodle Reverse Breast Stroke Arms and Bicycle Legs ?Single Leg Suspended Side Plank on Long Hand Float   ?Seated Straddle on Noodle Reverse Breast Stroke Arms and  ?Hip abd/add R/L each and then using 1 UE support ?Hip ext/flex with knee straight  pt needing VC and TC for correct execution and sequencing ?Marching knee/hip 90/90  ?Marching with added pool noodle for increased resistance  ?Ham curl R/L  ?Standing gastroc stretch with great toe flexed at pool wall R and L  ?Squats  ?Runners stretch on R and L 30 sec x 2 and then stretch in to runners hamstring stretch 2 x 30 sec R and L ?Figure 4 squat stretch with UE support for R and L x 60 sec each  ?Supine abdominal hollowing with VC to keep hips up and feet up with  yellow barbells in UE submerged ?prone aquatic  plank with barbell submerged for abdominal engagement,  difficulty keeping hip elevated ?SLS stance 30 sec hold x 5 L and R ?Heel raises x 30 ?Squat x 20 holding onto pool ledge as deeply as possible ? ?Scar tissue massage in water accompanied with aqua Stretch and with elevated rear

## 2021-12-08 ENCOUNTER — Encounter: Payer: Self-pay | Admitting: Allergy

## 2021-12-08 ENCOUNTER — Ambulatory Visit: Payer: Medicare Other | Admitting: Allergy

## 2021-12-08 ENCOUNTER — Ambulatory Visit: Payer: Medicare Other | Admitting: Physical Therapy

## 2021-12-08 VITALS — BP 128/72 | HR 111 | Temp 97.6°F | Resp 12 | Ht 62.5 in | Wt 135.8 lb

## 2021-12-08 DIAGNOSIS — Z780 Asymptomatic menopausal state: Secondary | ICD-10-CM | POA: Diagnosis not present

## 2021-12-08 DIAGNOSIS — M810 Age-related osteoporosis without current pathological fracture: Secondary | ICD-10-CM | POA: Diagnosis not present

## 2021-12-08 DIAGNOSIS — J301 Allergic rhinitis due to pollen: Secondary | ICD-10-CM | POA: Diagnosis not present

## 2021-12-08 DIAGNOSIS — J4530 Mild persistent asthma, uncomplicated: Secondary | ICD-10-CM | POA: Diagnosis not present

## 2021-12-08 DIAGNOSIS — M85851 Other specified disorders of bone density and structure, right thigh: Secondary | ICD-10-CM | POA: Diagnosis not present

## 2021-12-08 DIAGNOSIS — M85852 Other specified disorders of bone density and structure, left thigh: Secondary | ICD-10-CM | POA: Diagnosis not present

## 2021-12-08 NOTE — Patient Instructions (Addendum)
I don't have a sample of the proair digihaler. ?If you run out of the proair respiclick let me know and we can try the digihaler next.  ? ?Mild persistent asthma ?Daily controller medication(s): Symbicort 71mg 2 puffs once a day with spacer and rinse mouth afterwards. ?May use albuterol rescue inhaler 2 puffs or nebulizer every 4 to 6 hours as needed for shortness of breath, chest tightness, coughing, and wheezing. May use albuterol rescue inhaler 2 puffs 5 to 15 minutes prior to strenuous physical activities. Monitor frequency of use.  ?During upper respiratory infections/asthma flares: Start Symbicort 861m 2 puffs twice a day for 1-2 weeks ?Asthma control goals:  ?Full participation in all desired activities (may need albuterol before activity) ?Albuterol use two times or less a week on average (not counting use with activity) ?Cough interfering with sleep two times or less a month ?Oral steroids no more than once a year ?No hospitalizations ?  ?Allergic rhinitis ?2020 skin testing was borderline positive to tree pollen.  ?Continue environmental control measures.  ?May use azelastine nasal spray 1-2 sprays per nostril 1-2 times a day as needed for runny nose. ?May use over the counter antihistamines such as Zyrtec (cetirizine), Claritin (loratadine), Allegra (fexofenadine), or Xyzal (levocetirizine) daily as needed. May take twice a day during flares.  ? ?Follow up in 6 months or sooner if needed. ? ?Reducing Pollen Exposure ?Pollen seasons: trees (spring), grass (summer) and ragweed/weeds (fall). ?Keep windows closed in your home and car to lower pollen exposure.  ?Install air conditioning in the bedroom and throughout the house if possible.  ?Avoid going out in dry windy days - especially early morning. ?Pollen counts are highest between 5 - 10 AM and on dry, hot and windy days.  ?Save outside activities for late afternoon or after a heavy rain, when pollen levels are lower.  ?Avoid mowing of grass if you have  grass pollen allergy. ?Be aware that pollen can also be transported indoors on people and pets.  ?Dry your clothes in an automatic dryer rather than hanging them outside where they might collect pollen.  ?Rinse hair and eyes before bedtime. ?

## 2021-12-08 NOTE — Assessment & Plan Note (Signed)
Past history - Issues with coughing, wheezing and SOB for the past 3-4 years with worsening during URIs. Used to be on Flovent, Qvar in the past. Tried PPIs in the past with no benefit. 2020 skin testing showed: Borderline positive to maple tree pollen. 2020 spirometry showed: mild possible restrictive disease with 14% improvement in FEV post bronchodilator treatment.  ?Interim history - only flare she had was in January requiring oral prednisone.  ?? Today's spirometry showed some mild obstruction.  ?? Daily controller medication(s): Symbicort 18mg 2 puffs once a day with spacer and rinse mouth afterwards. ?? May need to use twice a day during the fall/winter season.  ?? May use albuterol rescue inhaler 2 puffs or nebulizer every 4 to 6 hours as needed for shortness of breath, chest tightness, coughing, and wheezing. May use albuterol rescue inhaler 2 puffs 5 to 15 minutes prior to strenuous physical activities. Monitor frequency of use.  ?? During upper respiratory infections/asthma flares: Start Symbicort 823m 2 puffs twice a day for 1-2 weeks. ?? Patient prefers Proair respiclick over [facility]er.  ?? If you run out of the proair respiclick let me know and we can try the proair digihaler next.  ?? Get spirometry at next visit. ?

## 2021-12-08 NOTE — Assessment & Plan Note (Signed)
Past history - Perennial rhinitis symptoms for the past 8 years. 2020 skin testing was borderline positive to tree pollen.  ?Interim history - asymptomatic with no medications. ?? Continue environmental control measures.  ?? May use azelastine nasal spray 1-2 sprays per nostril 1-2 times a day as needed for runny nose. ?? May use over the counter antihistamines such as Zyrtec (cetirizine), Claritin (loratadine), Allegra (fexofenadine), or Xyzal (levocetirizine) daily as needed. May take twice a day during flares.  ?

## 2021-12-09 ENCOUNTER — Ambulatory Visit: Payer: Medicare Other | Admitting: Physical Therapy

## 2021-12-09 ENCOUNTER — Encounter: Payer: Self-pay | Admitting: Physical Therapy

## 2021-12-09 DIAGNOSIS — R262 Difficulty in walking, not elsewhere classified: Secondary | ICD-10-CM

## 2021-12-09 DIAGNOSIS — G8929 Other chronic pain: Secondary | ICD-10-CM

## 2021-12-09 DIAGNOSIS — M6281 Muscle weakness (generalized): Secondary | ICD-10-CM

## 2021-12-09 DIAGNOSIS — R600 Localized edema: Secondary | ICD-10-CM | POA: Diagnosis not present

## 2021-12-09 DIAGNOSIS — M25562 Pain in left knee: Secondary | ICD-10-CM | POA: Diagnosis not present

## 2021-12-09 NOTE — Patient Instructions (Addendum)
Access Code: [email or web address] ?URL: [email or web address] ?Date: 12/09/2021 ?Prepared by: Voncille Lo ? ?Program Notes ?Retro walkHamstring walk at counterIsometric hamstring hold for 45 sec x 5  ? ?Exercises ?- Standing Terminal Knee Extension at Wall with Ball  - 1 x daily - 7 x weekly - 3 sets - 10 reps ?- X Band Walk  - 1 x daily - 7 x weekly - 2 sets - 10 reps ?- Squat with Counter Support  - 1 x daily - 7 x weekly - 2 sets - 10 reps ?- [place] Deadlift with Dumbbells just past Knees ONLY   - 1 x daily - 7 x weekly - 3 sets - 10 reps ?- Standing Hip Hinge with [place]  - 1 x daily - 7 x weekly - 3 sets - 10 reps ?- Goblet Squat with Kettlebell  - 1 x daily - 7 x weekly - 3 sets - 10 reps ?- Standing Sports administrator in [place]  - 1 x daily - 7 x weekly - 2 sets - 10 reps ?- Supine Hamstring Curl on Swiss Ball  - 1 x daily - 7 x weekly - 2-3 sets - 5-10 reps ?- Single Leg Balance on Foam Pad  - 1 x daily - 7 x weekly - 3 sets - 10 reps ?- Single Leg Balance with Opposite Leg Star Reach  - 1 x daily - 7 x weekly - 3 sets - 10 reps ? ?Voncille Lo, PT, ATRIC ?Certified Exercise Expert for the Aging Adult  ?12/09/21 9:57 AM ?Phone: [phone] ?Fax: [phone]  ?

## 2021-12-14 ENCOUNTER — Encounter: Payer: Medicare Other | Admitting: Physical Therapy

## 2021-12-16 ENCOUNTER — Ambulatory Visit: Payer: Medicare Other | Admitting: Physical Therapy

## 2021-12-21 ENCOUNTER — Encounter: Payer: Medicare Other | Admitting: Physical Therapy

## 2021-12-23 ENCOUNTER — Ambulatory Visit: Payer: Medicare Other | Admitting: Physical Therapy

## 2021-12-23 DIAGNOSIS — B354 Tinea corporis: Secondary | ICD-10-CM | POA: Diagnosis not present

## 2021-12-23 DIAGNOSIS — B351 Tinea unguium: Secondary | ICD-10-CM | POA: Diagnosis not present

## 2022-01-11 ENCOUNTER — Other Ambulatory Visit: Payer: Self-pay | Admitting: Internal Medicine

## 2022-01-12 ENCOUNTER — Encounter: Payer: Self-pay | Admitting: Internal Medicine

## 2022-02-16 DIAGNOSIS — M25571 Pain in right ankle and joints of right foot: Secondary | ICD-10-CM | POA: Diagnosis not present

## 2022-02-24 DIAGNOSIS — M7661 Achilles tendinitis, right leg: Secondary | ICD-10-CM | POA: Diagnosis not present

## 2022-04-07 DIAGNOSIS — M7661 Achilles tendinitis, right leg: Secondary | ICD-10-CM | POA: Diagnosis not present

## 2022-04-14 ENCOUNTER — Ambulatory Visit: Payer: Medicare Other | Admitting: Physical Therapy

## 2022-04-20 NOTE — Therapy (Signed)
OUTPATIENT PHYSICAL THERAPY LOWER EXTREMITY EVALUATION   Patient Name: Mary Garcia MRN: [id number] DOB:03-28-1946, 76 y.o., female Today's Date: 04/21/2022   PT End of Session - 04/21/22 1203     Visit Number 1    Number of Visits 13    Date for PT Re-Evaluation 06/02/22    Authorization Type BCBS    PT Start Time 1147    PT Stop Time 0962    PT Time Calculation (min) 48 min    Activity Tolerance Patient tolerated treatment well;Patient limited by pain    Behavior During Therapy WFL for tasks assessed/performed             Past Medical History:  Diagnosis Date   Anxiety    Arthritis    Asthma    Bronchitis, acute    Past Surgical History:  Procedure Laterality Date   BREAST ENHANCEMENT SURGERY     CATARACT EXTRACTION W/ INTRAOCULAR LENS IMPLANT Bilateral    FACELIFT     local   ROTATOR CUFF REPAIR Right    TIBIA FRACTURE SURGERY  2004   left leg x3, tibia and fibula   TOTAL KNEE ARTHROPLASTY Left 10/18/2021   Procedure: TOTAL KNEE ARTHROPLASTY;  Surgeon: Vickey Huger, MD;  Location: [facility];  Service: Orthopedics;  Laterality: Left;   TUMOR EXCISION  2003   right muscle back, benign   Patient Active Problem List   Diagnosis Date Noted   Seasonal allergic rhinitis due to pollen 05/17/2021   Mild persistent asthma without complication 83/66/2947   Other allergic rhinitis 03/27/2019   DDD (degenerative disc disease), cervical 09/15/2017   Depression 02/02/2016   Arthropathy of right shoulder 02/02/2016   Insomnia 04/13/2011   Osteoporosis 04/13/2011   Biliary dyskinesia 04/13/2011   Osteoarthritis 04/13/2011   History of migraine headaches 04/13/2011   Vitamin D deficiency 04/13/2011   Anxiety 04/13/2011   Irritable bowel syndrome 04/13/2011    PCP: Elby Showers, MD  REFERRING PROVIDER: Armond Hang MD  REFERRING DIAG: M76.61 (ICD-10-CM) - Achilles tendinitis, right leg  THERAPY DIAG:  Pain in right ankle and joints of right foot - Plan: PT plan  of care cert/re-cert  Muscle weakness (generalized) - Plan: PT plan of care cert/re-cert  Difficulty in walking, not elsewhere classified - Plan: PT plan of care cert/re-cert  Rationale for Evaluation and Treatment Rehabilitation  ONSET DATE: January 21, 2022  SUBJECTIVE:   SUBJECTIVE STATEMENT: I started having pain in my R back of my heel ( achilles tendonitis) 2 days after moving into my new house June 9,2023.  But it started doing worse even though I used ice on it.  Dr Kathaleen Bury put me in a boot for 6 weeks and now I am here for PT.  I have a heel lift in my R LE.  It has gotten better. I walk my dog 1/4 of a mile in morning and I ride on my e bike.  And water treading.  But nothing else. I am continuing the yoga.  My son is making me some orthotics( he is a prosthetist/orthotist),  I have not gotten them yet.     PERTINENT HISTORY: RTC R, Tibia fracture  2004/ rod removal, L  TKA 10-18-21, osteoporosis  PAIN:  Are you having pain? Yes: NPRS scale: 3/10 at rest but at worst now is 8/10 Pain location: R insertional achilles tendonitis Pain description: sharp, piercing throbbing Aggravating factors: standing for longer than 2 hours,  I don't walk more than 1/4  m Relieving factors: Meloxicam and Voltaren Can't walk more than 1/4 mile, limiting my usual 5 miles of walking, descending on steps worse than ascending.  PRECAUTIONS: None  WEIGHT BEARING RESTRICTIONS No  FALLS:  Has patient fallen in last 6 months? No  LIVING ENVIRONMENT: Lives with: lives with their spouse Lives in: House/apartment Stairs: Yes: Internal: 12 steps; on left going up and External: 2 steps; none Has following equipment at home:  [place]: retired  PLOF: Independent  PATIENT GOALS reduce pain, walking at least 2 miles at a time   OBJECTIVE:   DIAGNOSTIC FINDINGS: none recent noted in [place] for R ankle  PATIENT SURVEYS:  LEFS 46/80  57.5% FOTO 47% predicted 68  COGNITION:  Overall  cognitive status: Within functional limits for tasks assessed     SENSATION: WFL    MUSCLE LENGTH: Hamstrings: WNL   POSTURE: rounded shoulders, forward head, and anterior pelvic tilt WNL  PALPATION: TTP over insertional achilles R tendon  LOWER EXTREMITY ROM:  Active ROM Right eval Left eval  Hip flexion    Hip extension    Hip abduction    Hip adduction    Hip internal rotation    Hip external rotation    Knee flexion    Knee extension    Ankle dorsiflexion 12 slight pain in end range 10 old fibular fx with scress/plates  Ankle plantarflexion 40 52  Ankle inversion 23 30  Ankle eversion 35 21   (Blank rows = not tested)  LOWER EXTREMITY MMT:  MMT Right eval Left eval  Hip flexion 5 5  Hip extension  5  Hip abduction 4 5  Hip adduction    Hip internal rotation 4+ 5  Hip external rotation 4+ 5  Knee flexion    Knee extension    Ankle dorsiflexion 4+ 5  Ankle plantarflexion 4 5  Ankle inversion 4+ 5  Ankle eversion 4+ 5   (Blank rows = not tested)    FUNCTIONAL TESTS:  5 times sit to stand: 8.01 sec  GAIT: Distance walked: 150 feet Assistive device utilized: None Level of assistance: Complete Independence Comments: slight antalgic gait    TODAY'S TREATMENT: 04-21-22 Eval, iniital HEP issued   PATIENT EDUCATION:  Education details: POC Explanation of findings, initial HEP Person educated: Patient Education method: Explanation, Demonstration, Tactile cues, Verbal cues, and Handouts Education comprehension: verbalized understanding, returned demonstration, verbal cues required, tactile cues required, and needs further education   HOME EXERCISE PROGRAM: Access Code: [email or web address] URL: [email or web address] Date: 04/21/2022 Prepared by: Voncille Lo  Program Notes Single leg calf eccentrics (knee bent) Hold onto a door frame or counter, sit with hips back, bend your knees and lower into 1/2 squat, Maintain angle, perform a  double leg calf raise. At top position, shift your weight onto one leg and do a single leg eccentric into starting position. then lift again with both legs and repeat - two, up, one down   Do 2 sets of 15 reps  Exercises - Calf Mobilization with Foam Roll  - 1 x daily - 7 x weekly - 2 sets - 10 reps - Soleus Mobilization  - 1 x daily - 7 x weekly - 2 sets - 10 reps - Standing Plantar Fascia Mobilization with Small Ball  - 1 x daily - 7 x weekly - 2 sets - 10 reps - Eccentric Plantar Fascia Strengthening on Step  - 1-2 x daily - 7 x weekly - 3 sets - 10  reps - Split Squat Forward Trunk with Dumbbells  - 1 x daily - 7 x weekly - 3 sets - 10 reps - Bridge with Hamstring Curl on Swiss Ball  - 1 x daily - 7 x weekly - 3 sets - 10 reps  ASSESSMENT:  CLINICAL IMPRESSION: Patient is a 76 y.o. female who was seen today for physical therapy evaluation and treatment for R achilles Tendonitis due to over compensation of R LE after L TKA and moving into new home.  Pt is active woman that is used to walking 5 miles a day but now is only able to walk for 1/4 mile due to pain up to 8/10. After trial of walking Cam boot for 6 weeks, pt pain is decreased but she does demonstrate some functional weakness in R LE ( See flow sheet)  Pt will benefit from skilled PT to address deficits..    OBJECTIVE IMPAIRMENTS decreased activity tolerance, decreased knowledge of condition, decreased mobility, difficulty walking, decreased ROM, decreased strength, and pain.   ACTIVITY LIMITATIONS standing, squatting, stairs, and locomotion level  PARTICIPATION LIMITATIONS: cleaning, laundry, shopping, community activity, and walking for exercise  PERSONAL FACTORS  L  TKA 10-18-21, osteoporosis are also affecting patient's functional outcome.   REHAB POTENTIAL: Excellent  CLINICAL DECISION MAKING: Evolving/moderate complexity  EVALUATION COMPLEXITY: Moderate   GOALS: Goals reviewed with patient? Yes  SHORT TERM GOALS:  Target date: 05/12/2022  Patient will be independent and compliant with initial HEP Baseline:no knowledged for achilles tendonitis HEP Goal status: INITIAL  2.  Patient will demonstrate ability to perform 6 MWT in age appropriate normal range Baseline: TBD at 2nd visit Goal status: INITIAL  3.  Patient will maintain SLS for at least 30 seconds on the RLE to improve stability when navigating on uneven terrain.  Baseline: 17 sec R Goal status: INITIAL     LONG TERM GOALS: Target date: 06/02/2022   Pt will be independent with advanced HEP Baseline:  Goal status: INITIAL  2.  Patient will report minimal difficulty with stair negotiation including no dependence with UE and alternating step descend Baseline: must use UE and one step at a time to go down step Goal status: INITIAL  3.  Patient will score at least 60/80 on the LEFS to signify clinically meaningful improvement in functional abilities Baseline: 46/80 Goal status: INITIAL  4.  Pt will be able to walk 1 mile with minimal discomfort to increase walking for exercise tolerance Baseline:  Goal status: INITIAL  5.  FOTO will improve from 47%   to  68%   indicating improved functional mobility Baseline: eval 47% Goal status: INITIAL     PLAN: PT FREQUENCY: 2x/week  PT DURATION: 6 weeks  PLANNED INTERVENTIONS: Therapeutic exercises, Therapeutic activity, Neuromuscular re-education, Balance training, Gait training, Patient/Family education, Self Care, Joint mobilization, Stair training, Dry Needling, Electrical stimulation, Spinal mobilization, Cryotherapy, Moist heat, Taping, Ionotophoresis '4mg'$ /ml Dexamethasone, Manual therapy, and Re-evaluation  PLAN FOR NEXT SESSION: TPDN and progressive overload for HEP   Voncille Lo, PT, [place] Certified Exercise Expert for the Aging Adult  04/21/22 1:20 PM Phone: [phone] Fax: [phone]

## 2022-04-21 ENCOUNTER — Ambulatory Visit: Payer: Medicare Other | Attending: Internal Medicine | Admitting: Physical Therapy

## 2022-04-21 DIAGNOSIS — R262 Difficulty in walking, not elsewhere classified: Secondary | ICD-10-CM | POA: Diagnosis not present

## 2022-04-21 DIAGNOSIS — G8929 Other chronic pain: Secondary | ICD-10-CM | POA: Insufficient documentation

## 2022-04-21 DIAGNOSIS — M6281 Muscle weakness (generalized): Secondary | ICD-10-CM | POA: Diagnosis not present

## 2022-04-21 DIAGNOSIS — R600 Localized edema: Secondary | ICD-10-CM | POA: Diagnosis not present

## 2022-04-21 DIAGNOSIS — M25571 Pain in right ankle and joints of right foot: Secondary | ICD-10-CM | POA: Insufficient documentation

## 2022-04-21 DIAGNOSIS — M25562 Pain in left knee: Secondary | ICD-10-CM | POA: Insufficient documentation

## 2022-04-21 NOTE — Therapy (Signed)
OUTPATIENT PHYSICAL THERAPY TREATMENT NOTE   Patient Name: Mary Garcia MRN: [id number] DOB:03-02-1946, 76 y.o., female Today's Date: 04/21/2022  PCP: Elby Showers, MD REFERRING PROVIDER: Armond Hang MD  END OF SESSION:    Past Medical History:  Diagnosis Date   Anxiety    Arthritis    Asthma    Bronchitis, acute    Past Surgical History:  Procedure Laterality Date   BREAST ENHANCEMENT SURGERY     CATARACT EXTRACTION W/ INTRAOCULAR LENS IMPLANT Bilateral    FACELIFT     local   ROTATOR CUFF REPAIR Right    TIBIA FRACTURE SURGERY  2004   left leg x3, tibia and fibula   TOTAL KNEE ARTHROPLASTY Left 10/18/2021   Procedure: TOTAL KNEE ARTHROPLASTY;  Surgeon: Vickey Huger, MD;  Location: [facility];  Service: Orthopedics;  Laterality: Left;   TUMOR EXCISION  2003   right muscle back, benign   Patient Active Problem List   Diagnosis Date Noted   Seasonal allergic rhinitis due to pollen 05/17/2021   Mild persistent asthma without complication 09/62/8366   Other allergic rhinitis 03/27/2019   DDD (degenerative disc disease), cervical 09/15/2017   Depression 02/02/2016   Arthropathy of right shoulder 02/02/2016   Insomnia 04/13/2011   Osteoporosis 04/13/2011   Biliary dyskinesia 04/13/2011   Osteoarthritis 04/13/2011   History of migraine headaches 04/13/2011   Vitamin D deficiency 04/13/2011   Anxiety 04/13/2011   Irritable bowel syndrome 04/13/2011    REFERRING DIAG: M76.61 (ICD-10-CM) - Achilles tendinitis, right leg  THERAPY DIAG:  No diagnosis found.  Rationale for Evaluation and Treatment Rehabilitation  PERTINENT HISTORY: RTC R, Tibia fracture  2004/ rod removal, L  TKA 10-18-21, osteoporosis  PRECAUTIONS: none  SUBJECTIVE: *** I started having pain in my R back of my heel ( achilles tendonitis) 2 days after moving into my new house June 9,2023.  But it started doing worse even though I used ice on it.  Dr Kathaleen Bury put me in a boot for 6 weeks and now I  am here for PT.  I have a heel lift in my R LE.  It has gotten better. I walk my dog 1/4 of a mile in morning and I ride on my e bike.  And water treading.  But nothing else. I am continuing the yoga.  My son is making me some orthotics( he is a prosthetist/orthotist),  I have not gotten them yet.    PAIN:  Are you having pain? {OPRCPAIN:[id number]} Are you having pain? Yes: NPRS scale: 3/10 at rest but at worst now is 8/10 Pain location: R insertional achilles tendonitis Pain description: sharp, piercing throbbing Aggravating factors: standing for longer than 2 hours,  I don't walk more than 1/4 m Relieving factors: Meloxicam and Voltaren Can't walk more than 1/4 mile, limiting my usual 5 miles of walking, descending on steps worse than ascending.   OBJECTIVE: (objective measures completed at initial evaluation unless otherwise dated)  OBJECTIVE:    DIAGNOSTIC FINDINGS: none recent noted in Epic for R ankle   PATIENT SURVEYS:  LEFS 46/80  57.5% FOTO 47% predicted 68   COGNITION:           Overall cognitive status: Within functional limits for tasks assessed                          SENSATION: WFL       MUSCLE LENGTH: Hamstrings: WNL     POSTURE:  rounded shoulders, forward head, and anterior pelvic tilt WNL   PALPATION: TTP over insertional achilles R tendon   LOWER EXTREMITY ROM:   Active ROM Right eval Left eval  Hip flexion      Hip extension      Hip abduction      Hip adduction      Hip internal rotation      Hip external rotation      Knee flexion      Knee extension      Ankle dorsiflexion 12 slight pain in end range 10 old fibular fx with scress/plates  Ankle plantarflexion 40 52  Ankle inversion 23 30  Ankle eversion 35 21   (Blank rows = not tested)   LOWER EXTREMITY MMT:   MMT Right eval Left eval  Hip flexion 5 5  Hip extension   5  Hip abduction 4 5  Hip adduction      Hip internal rotation 4+ 5  Hip external rotation 4+ 5  Knee flexion       Knee extension      Ankle dorsiflexion 4+ 5  Ankle plantarflexion 4 5  Ankle inversion 4+ 5  Ankle eversion 4+ 5   (Blank rows = not tested)       FUNCTIONAL TESTS:  5 times sit to stand: 8.01 sec   GAIT: Distance walked: 150 feet Assistive device utilized: None Level of assistance: Complete Independence Comments: slight antalgic gait       TODAY'S TREATMENT: 04-21-22 Eval, iniital HEP issued     PATIENT EDUCATION:  Education details: POC Explanation of findings, initial HEP Person educated: Patient Education method: Explanation, Demonstration, Tactile cues, Verbal cues, and Handouts Education comprehension: verbalized understanding, returned demonstration, verbal cues required, tactile cues required, and needs further education     HOME EXERCISE PROGRAM: Access Code: [email or web address] URL: [email or web address] Date: 04/21/2022 Prepared by: Voncille Lo   Program Notes Single leg calf eccentrics (knee bent) Hold onto a door frame or counter, sit with hips back, bend your knees and lower into 1/2 squat, Maintain angle, perform a double leg calf raise. At top position, shift your weight onto one leg and do a single leg eccentric into starting position. then lift again with both legs and repeat - two, up, one down   Do 2 sets of 15 reps   Exercises - Calf Mobilization with Foam Roll  - 1 x daily - 7 x weekly - 2 sets - 10 reps - Soleus Mobilization  - 1 x daily - 7 x weekly - 2 sets - 10 reps - Standing Plantar Fascia Mobilization with Small Ball  - 1 x daily - 7 x weekly - 2 sets - 10 reps - Eccentric Plantar Fascia Strengthening on Step  - 1-2 x daily - 7 x weekly - 3 sets - 10 reps - Split Squat Forward Trunk with Dumbbells  - 1 x daily - 7 x weekly - 3 sets - 10 reps - Bridge with Hamstring Curl on Swiss Ball  - 1 x daily - 7 x weekly - 3 sets - 10 reps   ASSESSMENT:   CLINICAL IMPRESSION: Patient is a 76 y.o. female who was seen today for physical  therapy evaluation and treatment for R achilles Tendonitis due to over compensation of R LE after L TKA and moving into new home.  Pt is active woman that is used to walking 5 miles a day but now is only able to walk for 1/4  mile due to pain up to 8/10. After trial of walking Cam boot for 6 weeks, pt pain is decreased but she does demonstrate some functional weakness in R LE ( See flow sheet)  Pt will benefit from skilled PT to address deficits..      OBJECTIVE IMPAIRMENTS decreased activity tolerance, decreased knowledge of condition, decreased mobility, difficulty walking, decreased ROM, decreased strength, and pain.    ACTIVITY LIMITATIONS standing, squatting, stairs, and locomotion level   PARTICIPATION LIMITATIONS: cleaning, laundry, shopping, community activity, and walking for exercise   PERSONAL FACTORS  L  TKA 10-18-21, osteoporosis are also affecting patient's functional outcome.    REHAB POTENTIAL: Excellent   CLINICAL DECISION MAKING: Evolving/moderate complexity   EVALUATION COMPLEXITY: Moderate     GOALS: Goals reviewed with patient? Yes   SHORT TERM GOALS: Target date: 05/12/2022  Patient will be independent and compliant with initial HEP Baseline:no knowledged for achilles tendonitis HEP Goal status: INITIAL   2.  Patient will demonstrate ability to perform 6 MWT in age appropriate normal range Baseline: TBD at 2nd visit Goal status: INITIAL   3.  Patient will maintain SLS for at least 30 seconds on the RLE to improve stability when navigating on uneven terrain.  Baseline: 17 sec R Goal status: INITIAL         LONG TERM GOALS: Target date: 06/02/2022    Pt will be independent with advanced HEP Baseline:  Goal status: INITIAL   2.  Patient will report minimal difficulty with stair negotiation including no dependence with UE and alternating step descend Baseline: must use UE and one step at a time to go down step Goal status: INITIAL   3.  Patient will score  at least 60/80 on the LEFS to signify clinically meaningful improvement in functional abilities Baseline: 46/80 Goal status: INITIAL   4.  Pt will be able to walk 1 mile with minimal discomfort to increase walking for exercise tolerance Baseline:  Goal status: INITIAL   5.  FOTO will improve from 47%   to  68%   indicating improved functional mobility Baseline: eval 47% Goal status: INITIAL         PLAN: PT FREQUENCY: 2x/week   PT DURATION: 6 weeks   PLANNED INTERVENTIONS: Therapeutic exercises, Therapeutic activity, Neuromuscular re-education, Balance training, Gait training, Patient/Family education, Self Care, Joint mobilization, Stair training, Dry Needling, Electrical stimulation, Spinal mobilization, Cryotherapy, Moist heat, Taping, Ionotophoresis '4mg'$ /ml Dexamethasone, Manual therapy, and Re-evaluation   PLAN FOR NEXT SESSION: TPDN and progressive overload for HEP      ***

## 2022-04-21 NOTE — Patient Instructions (Signed)
Access Code: [email or web address] URL: [email or web address] Date: 04/21/2022 Prepared by: Voncille Lo  Program Notes Single leg calf eccentrics (knee bent) Hold onto a door frame or counter, sit with hips back, bend your knees and lower into 1/2 squat, Maintain angle, perform a double leg calf raise. At top position, shift your weight onto one leg and do a single leg eccentric into starting position. then lift again with both legs and repeat - two, up, one down   Do 2 sets of 15 reps  Exercises - Calf Mobilization with Foam Roll  - 1 x daily - 7 x weekly - 2 sets - 10 reps - Soleus Mobilization  - 1 x daily - 7 x weekly - 2 sets - 10 reps - Standing Plantar Fascia Mobilization with Small Ball  - 1 x daily - 7 x weekly - 2 sets - 10 reps - Eccentric Plantar Fascia Strengthening on Step  - 1-2 x daily - 7 x weekly - 3 sets - 10 reps - Split Squat Forward Trunk with Dumbbells  - 1 x daily - 7 x weekly - 3 sets - 10 reps - Bridge with Hamstring Curl on Swiss Ball  - 1 x daily - 7 x weekly - 3 sets - 10 reps  Voncille Lo, PT, [place] Certified Exercise Expert for the Aging Adult  04/21/22 12:22 PM Phone: [phone] Fax: [phone]

## 2022-04-26 ENCOUNTER — Ambulatory Visit: Payer: Medicare Other | Admitting: Physical Therapy

## 2022-04-26 ENCOUNTER — Encounter: Payer: Self-pay | Admitting: Physical Therapy

## 2022-04-26 DIAGNOSIS — R262 Difficulty in walking, not elsewhere classified: Secondary | ICD-10-CM

## 2022-04-26 DIAGNOSIS — M6281 Muscle weakness (generalized): Secondary | ICD-10-CM | POA: Diagnosis not present

## 2022-04-26 DIAGNOSIS — M25571 Pain in right ankle and joints of right foot: Secondary | ICD-10-CM | POA: Diagnosis not present

## 2022-04-26 DIAGNOSIS — R600 Localized edema: Secondary | ICD-10-CM | POA: Diagnosis not present

## 2022-04-26 DIAGNOSIS — G8929 Other chronic pain: Secondary | ICD-10-CM | POA: Diagnosis not present

## 2022-04-26 DIAGNOSIS — M25562 Pain in left knee: Secondary | ICD-10-CM | POA: Diagnosis not present

## 2022-05-03 ENCOUNTER — Encounter: Payer: Self-pay | Admitting: Physical Therapy

## 2022-05-03 ENCOUNTER — Ambulatory Visit: Payer: Medicare Other | Admitting: Physical Therapy

## 2022-05-03 DIAGNOSIS — G8929 Other chronic pain: Secondary | ICD-10-CM | POA: Diagnosis not present

## 2022-05-03 DIAGNOSIS — R262 Difficulty in walking, not elsewhere classified: Secondary | ICD-10-CM

## 2022-05-03 DIAGNOSIS — R600 Localized edema: Secondary | ICD-10-CM | POA: Diagnosis not present

## 2022-05-03 DIAGNOSIS — M6281 Muscle weakness (generalized): Secondary | ICD-10-CM | POA: Diagnosis not present

## 2022-05-03 DIAGNOSIS — M25571 Pain in right ankle and joints of right foot: Secondary | ICD-10-CM | POA: Diagnosis not present

## 2022-05-03 DIAGNOSIS — M25562 Pain in left knee: Secondary | ICD-10-CM | POA: Diagnosis not present

## 2022-05-03 NOTE — Therapy (Signed)
OUTPATIENT PHYSICAL THERAPY TREATMENT NOTE   Patient Name: Mary Garcia MRN: [id number] DOB:09/23/1945, 76 y.o., female Today's Date: 05/03/2022  PCP: Elby Showers, MD REFERRING PROVIDER: Armond Hang MD  END OF SESSION:   PT End of Session - 05/03/22 0942     Visit Number 3    Number of Visits 13    Date for PT Re-Evaluation 06/02/22    Authorization Type [employer]    PT Start Time [phone]    PT Stop Time 6294    PT Time Calculation (min) 39 min    Activity Tolerance Patient tolerated treatment well    Behavior During Therapy WFL for tasks assessed/performed              Past Medical History:  Diagnosis Date   Anxiety    Arthritis    Asthma    Bronchitis, acute    Past Surgical History:  Procedure Laterality Date   BREAST ENHANCEMENT SURGERY     CATARACT EXTRACTION W/ INTRAOCULAR LENS IMPLANT Bilateral    FACELIFT     local   ROTATOR CUFF REPAIR Right    TIBIA FRACTURE SURGERY  2004   left leg x3, tibia and fibula   TOTAL KNEE ARTHROPLASTY Left 10/18/2021   Procedure: TOTAL KNEE ARTHROPLASTY;  Surgeon: Vickey Huger, MD;  Location: [facility];  Service: Orthopedics;  Laterality: Left;   TUMOR EXCISION  2003   right muscle back, benign   Patient Active Problem List   Diagnosis Date Noted   Seasonal allergic rhinitis due to pollen 05/17/2021   Mild persistent asthma without complication 76/54/6503   Other allergic rhinitis 03/27/2019   DDD (degenerative disc disease), cervical 09/15/2017   Depression 02/02/2016   Arthropathy of right shoulder 02/02/2016   Insomnia 04/13/2011   Osteoporosis 04/13/2011   Biliary dyskinesia 04/13/2011   Osteoarthritis 04/13/2011   History of migraine headaches 04/13/2011   Vitamin D deficiency 04/13/2011   Anxiety 04/13/2011   Irritable bowel syndrome 04/13/2011    REFERRING DIAG: M76.61 (ICD-10-CM) - Achilles tendinitis, right leg  THERAPY DIAG:  Pain in right ankle and joints of right foot  Muscle weakness  (generalized)  Difficulty in walking, not elsewhere classified  Chronic pain of left knee  Localized edema  Rationale for Evaluation and Treatment Rehabilitation  PERTINENT HISTORY: RTC R, Tibia fracture  2004/ rod removal, L  TKA 10-18-21, osteoporosis  PRECAUTIONS: none  SUBJECTIVE: Ms Johannsen went on bicycle trip to [place].  Biked 24 miles and I we hiked a mile. Pain more on medial achilles insertional tendon   PAIN:   05-03-22  Pain is 2/10 today. At worst  now 5/10 mostly at night at end of day 04-26-22  1/10 at initial RX Are you having pain? Yes: NPRS scale: 3/10 at rest but at worst now is 8/10 Pain location: R insertional achilles tendonitis Pain description: sharp, piercing throbbing Aggravating factors: standing for longer than 2 hours,  I don't walk more than 1/4 m Relieving factors: Meloxicam and Voltaren Can't walk more than 1/4 mile, limiting my usual 5 miles of walking, descending on steps worse than ascending.   OBJECTIVE: (objective measures completed at initial evaluation unless otherwise dated)  OBJECTIVE:    DIAGNOSTIC FINDINGS: none recent noted in Epic for R ankle   PATIENT SURVEYS:  LEFS 46/80  57.5% FOTO 47% predicted 68   COGNITION:           Overall cognitive status: Within functional limits for tasks assessed  SENSATION: WFL       MUSCLE LENGTH: Hamstrings: WNL     POSTURE: rounded shoulders, forward head, and anterior pelvic tilt WNL   PALPATION: TTP over insertional achilles R tendon   LOWER EXTREMITY ROM:   Active ROM Right eval Left eval  Hip flexion      Hip extension      Hip abduction      Hip adduction      Hip internal rotation      Hip external rotation      Knee flexion      Knee extension      Ankle dorsiflexion 12 slight pain in end range 10 old fibular fx with scress/plates  Ankle plantarflexion 40 52  Ankle inversion 23 30  Ankle eversion 35 21   (Blank rows = not tested)    LOWER EXTREMITY MMT:   MMT Right eval Left eval  Hip flexion 5 5  Hip extension   5  Hip abduction 4 5  Hip adduction      Hip internal rotation 4+ 5  Hip external rotation 4+ 5  Knee flexion      Knee extension      Ankle dorsiflexion 4+ 5  Ankle plantarflexion 4 5  Ankle inversion 4+ 5  Ankle eversion 4+ 5   (Blank rows = not tested)       FUNCTIONAL TESTS:  5 times sit to stand: 8.01 sec   GAIT: Distance walked: 150 feet Assistive device utilized: None Level of assistance: Complete Independence Comments: slight antalgic gait       TODAY'S TREATMENT: [facility] Adult PT Treatment:                                                DATE: 05-03-22 Therapeutic Exercise: Calf Mobilization with Foam Roll  2 x 10 reps Soleus Mobilization  2  x 10 reps Bridge with Hamstring Curl on Swiss Ball   3 sets - 10 reps 45 second isometric R heel raise x 5 with 90 sec rest between reps Eccentric Plantar Fascia Strengthening on Step   3 x 10 reps Single leg calf eccentrics (knee bent)   Do 2 sets of 15 reps Standing Plantar Fascia Mobilization with Small Ball  - 1 x daily - 7 x weekly - 2 sets - 10 reps  STW and IASTYM ot R gastroc/achilles tendon concentrating on medial gastroc and medial achilles tendon   [facility] Adult PT Treatment:                                                DATE: 04-26-22 Therapeutic Exercise: Recumbent bike 6 min level 2 Calf Mobilization with Foam Roll  2 x 10 reps Soleus Mobilization  2  x 10 reps Single leg calf eccentric on R concentric bil to start 2 x 15  Eccentric Plantar Fascia Strengthening on Step   3 x 10 reps Single leg calf eccentrics (knee bent)   Do 2 sets of 15 reps Standing Plantar Fascia Mobilization with Small Ball  - 1 x daily - 7 x weekly - 2 sets - 10 reps Split Squat Forward Trunk  holding onto walking stick  2 sets - 10 reps 10  on R and 10 on L Bridge with Hamstring Curl on [employer]   3 sets - 10 reps Manual Therapy: STW and IASTYM ot R  gastroc/achilles tendon  04-21-22 Eval, iniital HEP issued     PATIENT EDUCATION:  Education details: POC Explanation of findings, initial HEP Person educated: Patient Education method: Explanation, Demonstration, Tactile cues, Verbal cues, and Handouts Education comprehension: verbalized understanding, returned demonstration, verbal cues required, tactile cues required, and needs further education     HOME EXERCISE PROGRAM: Access Code: [email or web address] URL: [email or web address] Date: 05/03/2022 Prepared by: Voncille Lo  Program Notes Single leg calf eccentrics (knee bent) Hold onto a door frame or counter, sit with hips back, bend your knees and lower into 1/2 squat, Maintain angle, perform a double leg calf raise. At top position, shift your weight onto one leg and do a single leg eccentric into starting position. then lift again with both legs and repeat - two, up, one down   Do 2 sets of 15 reps  Exercises - Calf Mobilization with Foam Roll  - 1 x daily - 7 x weekly - 2 sets - 10 reps - Soleus Mobilization  - 1 x daily - 7 x weekly - 2 sets - 10 reps - Standing Plantar Fascia Mobilization with Small Ball  - 1 x daily - 7 x weekly - 2 sets - 10 reps - Eccentric Plantar Fascia Strengthening on Step  - 1-2 x daily - 7 x weekly - 3 sets - 10 reps - Split Squat Forward Trunk with Dumbbells  - 1 x daily - 7 x weekly - 3 sets - 10 reps - Bridge with Hamstring Curl on Swiss Ball  - 1 x daily - 7 x weekly - 3 sets - 10 reps - Single Leg Isometric Heel Raise at Wall with [employer]  - 1 x daily - 7 x weekly - 1 sets - 5 reps - 45 hold   ASSESSMENT:   CLINICAL IMPRESSION: Ms Asper Pain is 2/10 today. At worst  now 5/10 mostly at night at end of day.  Pt with TTP with IASTYM at medial proximal insert and medial achilles tendonitis.  Pt needing PT VC and TC to perform exercise with correct execution.  Pt has been able to participate in leisure activities and recreation. During  exercise pain to 4/10 but able to participate fully with PT.   EVAL- Patient is a 76 y.o. female who was seen today for physical therapy evaluation and treatment for R achilles Tendonitis due to over compensation of R LE after L TKA and moving into new home.  Pt is active woman that is used to walking 5 miles a day but now is only able to walk for 1/4 mile due to pain up to 8/10. After trial of walking Cam boot for 6 weeks, pt pain is decreased but she does demonstrate some functional weakness in R LE ( See flow sheet)  Pt will benefit from skilled PT to address deficits..      OBJECTIVE IMPAIRMENTS decreased activity tolerance, decreased knowledge of condition, decreased mobility, difficulty walking, decreased ROM, decreased strength, and pain.    ACTIVITY LIMITATIONS standing, squatting, stairs, and locomotion level   PARTICIPATION LIMITATIONS: cleaning, laundry, shopping, community activity, and walking for exercise   PERSONAL FACTORS  L  TKA 10-18-21, osteoporosis are also affecting patient's functional outcome.    REHAB POTENTIAL: Excellent   CLINICAL DECISION MAKING: Evolving/moderate complexity   EVALUATION COMPLEXITY: Moderate  GOALS: Goals reviewed with patient? Yes   SHORT TERM GOALS: Target date: 05/12/2022  Patient will be independent and compliant with initial HEP Baseline:no knowledged for achilles tendonitis HEP Goal status:MET   2.  Patient will demonstrate ability to perform 6 MWT in age appropriate normal range Baseline: TBD at 2nd visit Goal status: INITIAL   3.  Patient will maintain SLS for at least 30 seconds on the RLE to improve stability when navigating on uneven terrain.  Baseline: 17 sec R Goal status: INITIAL         LONG TERM GOALS: Target date: 06/02/2022    Pt will be independent with advanced HEP Baseline:  Goal status: INITIAL   2.  Patient will report minimal difficulty with stair negotiation including no dependence with UE and  alternating step descend Baseline: must use UE and one step at a time to go down step Goal status: INITIAL   3.  Patient will score at least 60/80 on the LEFS to signify clinically meaningful improvement in functional abilities Baseline: 46/80 Goal status: INITIAL   4.  Pt will be able to walk 1 mile with minimal discomfort to increase walking for exercise tolerance Baseline:  Goal status: INITIAL   5.  FOTO will improve from 47%   to  68%   indicating improved functional mobility Baseline: eval 47% Goal status: INITIAL         PLAN: PT FREQUENCY: 2x/week   PT DURATION: 6 weeks   PLANNED INTERVENTIONS: Therapeutic exercises, Therapeutic activity, Neuromuscular re-education, Balance training, Gait training, Patient/Family education, Self Care, Joint mobilization, Stair training, Dry Needling, Electrical stimulation, Spinal mobilization, Cryotherapy, Moist heat, Taping, Ionotophoresis 29m/ml Dexamethasone, Manual therapy, and Re-evaluation   PLAN FOR NEXT SESSION: TPDN and progressive overload for HEP      LVoncille Lo PT, [place]ified Exercise Expert for the Aging Adult  05/03/22 3:40 PM Phone: [phone]: [phone]

## 2022-05-04 NOTE — Therapy (Signed)
OUTPATIENT PHYSICAL THERAPY TREATMENT NOTE   Patient Name: Mary Garcia MRN: [id number] DOB:1946/03/07, 76 y.o., female Today's Date: 05/05/2022  PCP: Elby Showers, MD REFERRING PROVIDER: Armond Hang MD  END OF SESSION:   PT End of Session - 05/05/22 1022     Visit Number 4    Number of Visits 13    Date for PT Re-Evaluation 06/02/22    Authorization Type BC[employer]    PT Start Time 1019    PT Stop Time 1100    PT Time Calculation (min) 41 min               Past Medical History:  Diagnosis Date   Anxiety    Arthritis    Asthma    Bronchitis, acute    Past Surgical History:  Procedure Laterality Date   BREAST ENHANCEMENT SURGERY     CATARACT EXTRACTION W/ INTRAOCULAR LENS IMPLANT Bilateral    FACELIFT     local   ROTATOR CUFF REPAIR Right    TIBIA FRACTURE SURGERY  2004   left leg x3, tibia and fibula   TOTAL KNEE ARTHROPLASTY Left 10/18/2021   Procedure: TOTAL KNEE ARTHROPLASTY;  Surgeon: Vickey Huger, MD;  Location: [facility];  Service: Orthopedics;  Laterality: Left;   TUMOR EXCISION  2003   right muscle back, benign   Patient Active Problem List   Diagnosis Date Noted   Seasonal allergic rhinitis due to pollen 05/17/2021   Mild persistent asthma without complication 47/65/4650   Other allergic rhinitis 03/27/2019   DDD (degenerative disc disease), cervical 09/15/2017   Depression 02/02/2016   Arthropathy of right shoulder 02/02/2016   Insomnia 04/13/2011   Osteoporosis 04/13/2011   Biliary dyskinesia 04/13/2011   Osteoarthritis 04/13/2011   History of migraine headaches 04/13/2011   Vitamin D deficiency 04/13/2011   Anxiety 04/13/2011   Irritable bowel syndrome 04/13/2011    REFERRING DIAG: M76.61 (ICD-10-CM) - Achilles tendinitis, right leg  THERAPY DIAG:  Pain in right ankle and joints of right foot  Muscle weakness (generalized)  Difficulty in walking, not elsewhere classified  Chronic pain of left knee  Localized edema  Rationale for  Evaluation and Treatment Rehabilitation  PERTINENT HISTORY: RTC R, Tibia fracture  2004/ rod removal, L  TKA 10-18-21, osteoporosis  PRECAUTIONS: none  SUBJECTIVE:  I am always doing better in the morning when   I start but my evening. I am in more pain   PAIN:   05-05-22    2/10 today with specific trigger point pain in R gastroc  05-03-22  Pain is 2/10 today. At worst  now 5/10 mostly at night at end of day 04-26-22  1/10 at initial RX Are you having pain? Yes: NPRS scale: 3/10 at rest but at worst now is 8/10 Pain location: R insertional achilles tendonitis Pain description: sharp, piercing throbbing Aggravating factors: standing for longer than 2 hours,  I don't walk more than 1/4 m Relieving factors: Meloxicam and Voltaren Can't walk more than 1/4 mile, limiting my usual 5 miles of walking, descending on steps worse than ascending.   OBJECTIVE: (objective measures completed at initial evaluation unless otherwise dated)  OBJECTIVE:    DIAGNOSTIC FINDINGS: none recent noted in Epic for R ankle   PATIENT SURVEYS:  LEFS 46/80  57.5% FOTO 47% predicted 68   COGNITION:           Overall cognitive status: Within functional limits for tasks assessed  SENSATION: WFL       MUSCLE LENGTH: Hamstrings: WNL     POSTURE: rounded shoulders, forward head, and anterior pelvic tilt WNL   PALPATION: TTP over insertional achilles R tendon   LOWER EXTREMITY ROM:   Active ROM Right eval Left eval  Hip flexion      Hip extension      Hip abduction      Hip adduction      Hip internal rotation      Hip external rotation      Knee flexion      Knee extension      Ankle dorsiflexion 12 slight pain in end range 10 old fibular fx with scress/plates  Ankle plantarflexion 40 52  Ankle inversion 23 30  Ankle eversion 35 21   (Blank rows = not tested)   LOWER EXTREMITY MMT:   MMT Right eval Left eval  Hip flexion 5 5  Hip extension   5  Hip abduction 4 5   Hip adduction      Hip internal rotation 4+ 5  Hip external rotation 4+ 5  Knee flexion      Knee extension      Ankle dorsiflexion 4+ 5  Ankle plantarflexion 4 5  Ankle inversion 4+ 5  Ankle eversion 4+ 5   (Blank rows = not tested)       FUNCTIONAL TESTS:  5 times sit to stand: 8.01 sec   GAIT: Distance walked: 150 feet Assistive device utilized: None Level of assistance: Complete Independence Comments: slight antalgic gait       TODAY'S TREATMENT: [facility] Adult PT Treatment:                                                DATE: 05-04-22 Therapeutic Exercise: Kickstand deadlift with 10 lb on R  3 x 10 with rest between 45 second isometric R heel raise straight knee  x 3 with 90 sec rest between reps 45 second isometric R heel raise with bent knee  x 3 with 90 sec rest between reps Eccentric Plantar Fascia Strengthening on Step   3 x 10 reps Single leg calf eccentrics (knee bent)   Do 2 sets of 15 reps Standing Plantar Fascia Mobilization with Small Ball  - 1 x daily - 7 x weekly - 2 sets - 10 reps  STW ot R gastroc/achilles tendon concentrating on medial gastroc and medial achilles tendon Trigger Point Dry Needling Treatment: Pre-treatment instruction: Patient instructed on dry needling rationale, procedures, and possible side effects including pain during treatment (achy,cramping feeling), bruising, drop of blood, lightheadedness, nausea, sweating. Patient Consent Given: Yes Education handout provided: Previously provided Muscles treated: Left medial gastroc, achilles tendon  Needle size and number: .30x53mm x 4 Electrical stimulation performed: Yes Parameters:  25 pps to pt tolerance  alligator clips Treatment response/outcome: Palpable decrease in muscle tension Post-treatment instructions: Patient instructed to expect possible mild to moderate muscle soreness later today and/or tomorrow. Patient instructed in methods to reduce muscle soreness and to continue prescribed  HEP. If patient was dry needled over the lung field, patient was instructed on signs and symptoms of pneumothorax and, however unlikely, to see immediate medical attention should they occur. Patient was also educated on signs and symptoms of infection and to seek medical attention should they occur. Patient verbalized understanding of these  instructions and education.  [facility] Adult PT Treatment:                                                DATE: 05-03-22  Therapeutic Exercise: Calf Mobilization with Foam Roll  2 x 10 reps Soleus Mobilization  2  x 10 reps Bridge with Hamstring Curl on Swiss Ball   3 sets - 10 reps 45 second isometric R heel raise x 5 with 90 sec rest between reps Eccentric Plantar Fascia Strengthening on Step   3 x 10 reps Single leg calf eccentrics (knee bent)   Do 2 sets of 15 reps Standing Plantar Fascia Mobilization with Small Ball  - 1 x daily - 7 x weekly - 2 sets - 10 reps  STW and IASTYM ot R gastroc/achilles tendon concentrating on medial gastroc and medial achilles tendon   [facility] Adult PT Treatment:                                                DATE: 04-26-22 Therapeutic Exercise: Recumbent bike 6 min level 2 Calf Mobilization with Foam Roll  2 x 10 reps Soleus Mobilization  2  x 10 reps Single leg calf eccentric on R concentric bil to start 2 x 15  Eccentric Plantar Fascia Strengthening on Step   3 x 10 reps Single leg calf eccentrics (knee bent)   Do 2 sets of 15 reps Standing Plantar Fascia Mobilization with Small Ball  - 1 x daily - 7 x weekly - 2 sets - 10 reps Split Squat Forward Trunk  holding onto walking stick  2 sets - 10 reps 10 on R and 10 on L Bridge with Hamstring Curl on [employer]   3 sets - 10 reps Manual Therapy: STW and IASTYM ot R gastroc/achilles tendon  04-21-22 Eval, iniital HEP issued     PATIENT EDUCATION:  Education details: POC Explanation of findings, initial HEP Person educated: Patient Education method: Explanation,  Demonstration, Tactile cues, Verbal cues, and Handouts Education comprehension: verbalized understanding, returned demonstration, verbal cues required, tactile cues required, and needs further education     HOME EXERCISE PROGRAM: Access Code: [email or web address] URL: [email or web address] Date: 05/03/2022 Prepared by: Voncille Lo  Program Notes Single leg calf eccentrics (knee bent) Hold onto a door frame or counter, sit with hips back, bend your knees and lower into 1/2 squat, Maintain angle, perform a double leg calf raise. At top position, shift your weight onto one leg and do a single leg eccentric into starting position. then lift again with both legs and repeat - two, up, one down   Do 2 sets of 15 reps  Exercises - Calf Mobilization with Foam Roll  - 1 x daily - 7 x weekly - 2 sets - 10 reps - Soleus Mobilization  - 1 x daily - 7 x weekly - 2 sets - 10 reps - Standing Plantar Fascia Mobilization with Small Ball  - 1 x daily - 7 x weekly - 2 sets - 10 reps - Eccentric Plantar Fascia Strengthening on Step  - 1-2 x daily - 7 x weekly - 3 sets - 10 reps - Split Squat Forward Trunk with Dumbbells  - 1 x daily -  7 x weekly - 3 sets - 10 reps - Bridge with Hamstring Curl on Swiss Ball  - 1 x daily - 7 x weekly - 3 sets - 10 reps - Single Leg Isometric Heel Raise at Wall with [employer]  - 1 x daily - 7 x weekly - 1 sets - 5 reps - 45 hold   ASSESSMENT:   CLINICAL IMPRESSION: Ms Kovack Pain is 2/10 today and consented to [facility] with estim for 8 minutes after set up.  Session then concentrated on isometric loading in straight and bent knee followed by rest of HEP with additonal weight added . PT followed TPDN and estim at medial proximal insert and medial achilles tendonitiswith manual STW before ending with exericse. Pt ended RX with 2/10 pain .  Isometric loading with 3-4/10 pain but tolerating 45 sec bouts. Pt will need to assess goals next visit.   EVAL- Patient is a 76 y.o. female  who was seen today for physical therapy evaluation and treatment for R achilles Tendonitis due to over compensation of R LE after L TKA and moving into new home.  Pt is active woman that is used to walking 5 miles a day but now is only able to walk for 1/4 mile due to pain up to 8/10. After trial of walking Cam boot for 6 weeks, pt pain is decreased but she does demonstrate some functional weakness in R LE ( See flow sheet)  Pt will benefit from skilled PT to address deficits..      OBJECTIVE IMPAIRMENTS decreased activity tolerance, decreased knowledge of condition, decreased mobility, difficulty walking, decreased ROM, decreased strength, and pain.    ACTIVITY LIMITATIONS standing, squatting, stairs, and locomotion level   PARTICIPATION LIMITATIONS: cleaning, laundry, shopping, community activity, and walking for exercise   PERSONAL FACTORS  L  TKA 10-18-21, osteoporosis are also affecting patient's functional outcome.    REHAB POTENTIAL: Excellent   CLINICAL DECISION MAKING: Evolving/moderate complexity   EVALUATION COMPLEXITY: Moderate     GOALS: Goals reviewed with patient? Yes   SHORT TERM GOALS: Target date: 05/12/2022  Patient will be independent and compliant with initial HEP Baseline:no knowledged for achilles tendonitis HEP Goal status:MET   2.  Patient will demonstrate ability to perform 6 MWT in age appropriate normal range Baseline: TBD at 2nd visit Goal status: INITIAL   3.  Patient will maintain SLS for at least 30 seconds on the RLE to improve stability when navigating on uneven terrain.  Baseline: 17 sec R Goal status: INITIAL         LONG TERM GOALS: Target date: 06/02/2022    Pt will be independent with advanced HEP Baseline:  Goal status: INITIAL   2.  Patient will report minimal difficulty with stair negotiation including no dependence with UE and alternating step descend Baseline: must use UE and one step at a time to go down step Goal status:  INITIAL   3.  Patient will score at least 60/80 on the LEFS to signify clinically meaningful improvement in functional abilities Baseline: 46/80 Goal status: INITIAL   4.  Pt will be able to walk 1 mile with minimal discomfort to increase walking for exercise tolerance Baseline:  Goal status: INITIAL   5.  FOTO will improve from 47%   to  68%   indicating improved functional mobility Baseline: eval 47% Goal status: INITIAL         PLAN: PT FREQUENCY: 2x/week   PT DURATION: 6 weeks  PLANNED INTERVENTIONS: Therapeutic exercises, Therapeutic activity, Neuromuscular re-education, Balance training, Gait training, Patient/Family education, Self Care, Joint mobilization, Stair training, Dry Needling, Electrical stimulation, Spinal mobilization, Cryotherapy, Moist heat, Taping, Ionotophoresis 4mg /ml Dexamethasone, Manual therapy, and Re-evaluation   PLAN FOR NEXT SESSION: GOALS and 6 MWT      Voncille Lo, PT, [facility] Certified Exercise Expert for the Aging Adult  05/05/22 12:33 PM Phone: [phone] Fax: [phone]

## 2022-05-05 ENCOUNTER — Encounter: Payer: Self-pay | Admitting: Physical Therapy

## 2022-05-05 ENCOUNTER — Ambulatory Visit: Payer: Medicare Other | Admitting: Physical Therapy

## 2022-05-05 DIAGNOSIS — M6281 Muscle weakness (generalized): Secondary | ICD-10-CM

## 2022-05-05 DIAGNOSIS — M25571 Pain in right ankle and joints of right foot: Secondary | ICD-10-CM | POA: Diagnosis not present

## 2022-05-05 DIAGNOSIS — G8929 Other chronic pain: Secondary | ICD-10-CM | POA: Diagnosis not present

## 2022-05-05 DIAGNOSIS — R262 Difficulty in walking, not elsewhere classified: Secondary | ICD-10-CM | POA: Diagnosis not present

## 2022-05-05 DIAGNOSIS — R600 Localized edema: Secondary | ICD-10-CM | POA: Diagnosis not present

## 2022-05-05 DIAGNOSIS — M25562 Pain in left knee: Secondary | ICD-10-CM | POA: Diagnosis not present

## 2022-05-09 ENCOUNTER — Other Ambulatory Visit: Payer: Medicare Other

## 2022-05-09 DIAGNOSIS — F419 Anxiety disorder, unspecified: Secondary | ICD-10-CM | POA: Diagnosis not present

## 2022-05-09 DIAGNOSIS — E7800 Pure hypercholesterolemia, unspecified: Secondary | ICD-10-CM

## 2022-05-09 DIAGNOSIS — F32A Depression, unspecified: Secondary | ICD-10-CM | POA: Diagnosis not present

## 2022-05-10 ENCOUNTER — Ambulatory Visit (INDEPENDENT_AMBULATORY_CARE_PROVIDER_SITE_OTHER): Payer: Medicare Other | Admitting: Internal Medicine

## 2022-05-10 ENCOUNTER — Encounter: Payer: Medicare Other | Admitting: Physical Therapy

## 2022-05-10 ENCOUNTER — Encounter: Payer: Self-pay | Admitting: Internal Medicine

## 2022-05-10 VITALS — BP 108/72 | HR 87 | Temp 98.2°F | Ht 62.5 in | Wt 136.8 lb

## 2022-05-10 DIAGNOSIS — E7800 Pure hypercholesterolemia, unspecified: Secondary | ICD-10-CM | POA: Diagnosis not present

## 2022-05-10 DIAGNOSIS — F411 Generalized anxiety disorder: Secondary | ICD-10-CM

## 2022-05-10 DIAGNOSIS — N3941 Urge incontinence: Secondary | ICD-10-CM | POA: Diagnosis not present

## 2022-05-10 DIAGNOSIS — Z8659 Personal history of other mental and behavioral disorders: Secondary | ICD-10-CM

## 2022-05-10 DIAGNOSIS — Z8709 Personal history of other diseases of the respiratory system: Secondary | ICD-10-CM

## 2022-05-10 DIAGNOSIS — M810 Age-related osteoporosis without current pathological fracture: Secondary | ICD-10-CM

## 2022-05-10 DIAGNOSIS — G4700 Insomnia, unspecified: Secondary | ICD-10-CM

## 2022-05-10 DIAGNOSIS — Z8249 Family history of ischemic heart disease and other diseases of the circulatory system: Secondary | ICD-10-CM

## 2022-05-10 DIAGNOSIS — Z96652 Presence of left artificial knee joint: Secondary | ICD-10-CM

## 2022-05-10 DIAGNOSIS — J683 Other acute and subacute respiratory conditions due to chemicals, gases, fumes and vapors: Secondary | ICD-10-CM

## 2022-05-10 LAB — CBC WITH DIFFERENTIAL/PLATELET
Absolute Monocytes: 656 cells/uL (ref 200–950)
Basophils Absolute: 8 cells/uL (ref 0–200)
Basophils Relative: 0.1 %
Eosinophils Absolute: 48 cells/uL (ref 15–500)
Eosinophils Relative: 0.6 %
HCT: 39.7 % (ref 35.0–45.0)
Hemoglobin: 13.3 g/dL (ref 11.7–15.5)
Lymphs Abs: 1304 cells/uL (ref 850–3900)
MCH: 29.6 pg (ref 27.0–33.0)
MCHC: 33.5 g/dL (ref 32.0–36.0)
MCV: 88.2 fL (ref 80.0–100.0)
MPV: 10.6 fL (ref 7.5–12.5)
Monocytes Relative: 8.2 %
Neutro Abs: 5984 cells/uL (ref 1500–7800)
Neutrophils Relative %: 74.8 %
Platelets: 320 10*3/uL (ref 140–400)
RBC: 4.5 10*6/uL (ref 3.80–5.10)
RDW: 14.2 % (ref 11.0–15.0)
Total Lymphocyte: 16.3 %
WBC: 8 10*3/uL (ref 3.8–10.8)

## 2022-05-10 LAB — LIPID PANEL
Cholesterol: 214 mg/dL — ABNORMAL HIGH (ref <200)
HDL: 125 mg/dL (ref >=50)
LDL Cholesterol (Calc): 73 mg/dL (calc)
Non-HDL Cholesterol (Calc): 89 mg/dL (calc) (ref <130)
Total CHOL/HDL Ratio: 1.7 (calc) (ref <5.0)
Triglycerides: 82 mg/dL (ref <150)

## 2022-05-10 LAB — POCT URINALYSIS DIPSTICK
Bilirubin, UA: NEGATIVE
Blood, UA: NEGATIVE
Glucose, UA: NEGATIVE
Ketones, UA: NEGATIVE
Leukocytes, UA: NEGATIVE
Nitrite, UA: NEGATIVE
Protein, UA: NEGATIVE
Spec Grav, UA: 1.02 (ref 1.010–1.025)
Urobilinogen, UA: 0.2 E.U./dL
pH, UA: 5 (ref 5.0–8.0)

## 2022-05-10 MED ORDER — MIRABEGRON ER 25 MG PO TB24: 25.0000 mg | ORAL_TABLET | Freq: Every day | ORAL | 1 refills | Status: DC

## 2022-05-10 MED ORDER — AZITHROMYCIN 250 MG PO TABS: ORAL_TABLET | ORAL | 0 refills | Status: AC

## 2022-05-10 NOTE — Patient Instructions (Addendum)
Labs are stable.  Continue follow-up with Endocrinology for osteoporosis.  Z-Pak prescribed for trip to [place] discussed.  Coronary calcium score ordered for the near future.  Trial of Myrbetriq for urinary incontinence.

## 2022-05-10 NOTE — Progress Notes (Signed)
   Subjective:    Patient ID: Mary Garcia, female    DOB: 1945-10-24, 76 y.o.   MRN: [id number]  HPI 76 year old Female seen for 6 month recheck. Has Achilles tendinitis right foot. Has been seen at [facility] and has been going to PT. Vaccines needed and time frame discussed. Happy with knee replacement. Has no pain in knee now.  Traveling to [place] soon.  Will wear a mask.  Provided her with Zithromax Z-PAK for treatment.  She is having issues with urinary incontinence and Myrbetriq was prescribed today.  She is concerned about her heart and coronary CT scoring was ordered.  She will need to call for an appointment.  She has no chest pain.  She has extremely high HDL of 125.  Total cholesterol of 214, triglycerides 82 and LDL cholesterol of 73.  She is asking about statin therapy but in my opinion does not needed unless coronary calcium score shows something concerning.  Takes Xanax as needed for anxiety.  Heath Gold for osteoporosis and bone density study showed T score of -2.5 on Boniva when checked in April.  Dr. Letta Median recommends Boniva for 5 years and she has been on it 3.5 years.  She says patient can consider Prolia after that.  She is on vitamin D supplement.  Review of Systems see above-no new complaints     Objective:   Physical Exam Vital signs reviewed.  Blood pressure 108/72 pulse 87 temperature 98.2 degrees pulse oximetry 96% weight 136 pounds 12.8 ounces height 5 feet 2.5 inches BMI 24.62 She has clear to auscultation.  Cardiac exam: Regular rate and rhythm.  No lower extremity pitting edema.  Dipstick UA checked today with no evidence of UTI     Assessment & Plan:  Urinary incontinence-trial of Myrbetriq  Osteoporosis treated with Boniva  Anxiety treated with Xanax-take sparingly  History of allergic rhinitis and reactive airways disease treated with Symbicort, Astelin and ProAir  Travel advice-patient will be prescribed Z-Pak to take with her to [place]  [place]  Longstanding history of insomnia treated with Ambien  Family history of heart disease-coronary calcium scoring ordered  History of total left knee arthroplasty stable

## 2022-05-11 NOTE — Therapy (Signed)
OUTPATIENT PHYSICAL THERAPY TREATMENT NOTE   Patient Name: Mary Garcia MRN: [id number] DOB:1946-02-03, 76 y.o., female 72 Date: 05/12/2022  PCP: Elby Showers, MD REFERRING PROVIDER: Armond Hang MD  END OF SESSION:   PT End of Session - 05/12/22 0934     Visit Number 5    Number of Visits 13    Date for PT Re-Evaluation 06/02/22    Authorization Type BCBS    PT Start Time [phone]    Activity Tolerance Patient tolerated treatment well    Behavior During Therapy [facility] for tasks assessed/performed                Past Medical History:  Diagnosis Date   Anxiety    Arthritis    Asthma    Bronchitis, acute    Past Surgical History:  Procedure Laterality Date   BREAST ENHANCEMENT SURGERY     CATARACT EXTRACTION W/ INTRAOCULAR LENS IMPLANT Bilateral    FACELIFT     local   ROTATOR CUFF REPAIR Right    TIBIA FRACTURE SURGERY  2004   left leg x3, tibia and fibula   TOTAL KNEE ARTHROPLASTY Left 10/18/2021   Procedure: TOTAL KNEE ARTHROPLASTY;  Surgeon: Vickey Huger, MD;  Location: [facility];  Service: Orthopedics;  Laterality: Left;   TUMOR EXCISION  2003   right muscle back, benign   Patient Active Problem List   Diagnosis Date Noted   Seasonal allergic rhinitis due to pollen 05/17/2021   Mild persistent asthma without complication 63/84/5364   Other allergic rhinitis 03/27/2019   DDD (degenerative disc disease), cervical 09/15/2017   Depression 02/02/2016   Arthropathy of right shoulder 02/02/2016   Insomnia 04/13/2011   Osteoporosis 04/13/2011   Biliary dyskinesia 04/13/2011   Osteoarthritis 04/13/2011   History of migraine headaches 04/13/2011   Vitamin D deficiency 04/13/2011   Anxiety 04/13/2011   Irritable bowel syndrome 04/13/2011    REFERRING DIAG: M76.61 (ICD-10-CM) - Achilles tendinitis, right leg  THERAPY DIAG:  Pain in right ankle and joints of right foot  Muscle weakness (generalized)  Difficulty in walking, not elsewhere  classified  Chronic pain of left knee  Localized edema  Rationale for Evaluation and Treatment Rehabilitation  PERTINENT HISTORY: RTC R, Tibia fracture  2004/ rod removal, L  TKA 10-18-21, osteoporosis  PRECAUTIONS: none  SUBJECTIVE:  I am always doing better in the morning when   I start but my evening. I am in more pain   PAIN:   05-05-22    2/10 today with specific trigger point pain in R gastroc  05-03-22  Pain is 2/10 today. At worst  now 5/10 mostly at night at end of day 04-26-22  1/10 at initial RX Are you having pain? Yes: NPRS scale: 3/10 at rest but at worst now is 8/10 Pain location: R insertional achilles tendonitis Pain description: sharp, piercing throbbing Aggravating factors: standing for longer than 2 hours,  I don't walk more than 1/4 m Relieving factors: Meloxicam and Voltaren Can't walk more than 1/4 mile, limiting my usual 5 miles of walking, descending on steps worse than ascending.   OBJECTIVE: (objective measures completed at initial evaluation unless otherwise dated)  OBJECTIVE:    DIAGNOSTIC FINDINGS: none recent noted in Epic for R ankle   PATIENT SURVEYS:  LEFS 46/80  57.5% FOTO 47% predicted 68 6MWT 168ft (norm for age [phone] ft)   COGNITION:           Overall cognitive status: Within functional limits for  tasks assessed                          SENSATION: WFL       MUSCLE LENGTH: Hamstrings: WNL     POSTURE: rounded shoulders, forward head, and anterior pelvic tilt WNL   PALPATION: TTP over insertional achilles R tendon   LOWER EXTREMITY ROM:   Active ROM Right eval Left eval  Hip flexion      Hip extension      Hip abduction      Hip adduction      Hip internal rotation      Hip external rotation      Knee flexion      Knee extension      Ankle dorsiflexion 12 slight pain in end range 10 old fibular fx with scress/plates  Ankle plantarflexion 40 52  Ankle inversion 23 30  Ankle eversion 35 21   (Blank rows = not  tested)   LOWER EXTREMITY MMT:   MMT Right eval Left eval  Hip flexion 5 5  Hip extension   5  Hip abduction 4 5  Hip adduction      Hip internal rotation 4+ 5  Hip external rotation 4+ 5  Knee flexion      Knee extension      Ankle dorsiflexion 4+ 5  Ankle plantarflexion 4 5  Ankle inversion 4+ 5  Ankle eversion 4+ 5   (Blank rows = not tested)       FUNCTIONAL TESTS:  5 times sit to stand: 8.01 sec   GAIT: Distance walked: 150 feet Assistive device utilized: None Level of assistance: Complete Independence Comments: slight antalgic gait       TODAY'S TREATMENT: [facility] Adult PT Treatment:                                                DATE: 05-12-22 6MWT 16101ft (norm for age [phone]) to begin Therapeutic Exercise:  Heel raise 3 x 10 at max range  bearing wt through great toe Eccentric Plantar Fascia Strengthening on Step   3 x 10 reps Kickstand deadlift with 15lb on R  3 x 10 with rest between walking it out 45 second isometric R heel raise straight knee  x 3 with 90 sec rest between reps 45 second isometric R heel raise with bent knee  x 3 with 90 sec rest between rep Single leg calf eccentrics (knee bent)   Do 2 sets of 15 reps Standing Plantar Fascia Mobilization with Small Ball  - 1 x daily - 7 x weekly - 2 sets - 10 reps Single limb bridge with 10 lb 3 x 10 on R SLS on there ex pad  over 3 min SLS on R LE with vectors with L for 3 min Manual Therapy: STW ot R gastroc/achilles tendon concentrating on medial gastroc and medial achilles tendon  IASTYM   [facility] Adult PT Treatment:                                                DATE: 05-04-22 Therapeutic Exercise: Kickstand deadlift with 10 lb on R  3 x 10 with rest between  45 second isometric R heel raise straight knee  x 3 with 90 sec rest between reps 45 second isometric R heel raise with bent knee  x 3 with 90 sec rest between reps Eccentric Plantar Fascia Strengthening on Step   3 x 10 reps Single leg  calf eccentrics (knee bent)   Do 2 sets of 15 reps Standing Plantar Fascia Mobilization with Small Ball  - 1 x daily - 7 x weekly - 2 sets - 10 reps  STW ot R gastroc/achilles tendon concentrating on medial gastroc and medial achilles tendon Trigger Point Dry Needling Treatment: Pre-treatment instruction: Patient instructed on dry needling rationale, procedures, and possible side effects including pain during treatment (achy,cramping feeling), bruising, drop of blood, lightheadedness, nausea, sweating. Patient Consent Given: Yes Education handout provided: Previously provided Muscles treated: Left medial gastroc, achilles tendon  Needle size and number: .30x37mm x 4 Electrical stimulation performed: Yes Parameters:  25 pps to pt tolerance  alligator clips Treatment response/outcome: Palpable decrease in muscle tension Post-treatment instructions: Patient instructed to expect possible mild to moderate muscle soreness later today and/or tomorrow. Patient instructed in methods to reduce muscle soreness and to continue prescribed HEP. If patient was dry needled over the lung field, patient was instructed on signs and symptoms of pneumothorax and, however unlikely, to see immediate medical attention should they occur. Patient was also educated on signs and symptoms of infection and to seek medical attention should they occur. Patient verbalized understanding of these instructions and education.  [facility] Adult PT Treatment:                                                DATE: 05-03-22  Therapeutic Exercise: Calf Mobilization with Foam Roll  2 x 10 reps Soleus Mobilization  2  x 10 reps Bridge with Hamstring Curl on Swiss Ball   3 sets - 10 reps 45 second isometric R heel raise x 5 with 90 sec rest between reps Eccentric Plantar Fascia Strengthening on Step   3 x 10 reps Single leg calf eccentrics (knee bent)   Do 2 sets of 15 reps Standing Plantar Fascia Mobilization with Small Ball  - 1 x daily - 7 x  weekly - 2 sets - 10 reps  STW and IASTYM ot R gastroc/achilles tendon concentrating on medial gastroc and medial achilles tendon   [facility] Adult PT Treatment:                                                DATE: 04-26-22 Therapeutic Exercise: Recumbent bike 6 min level 2 Calf Mobilization with Foam Roll  2 x 10 reps Soleus Mobilization  2  x 10 reps Single leg calf eccentric on R concentric bil to start 2 x 15  Eccentric Plantar Fascia Strengthening on Step   3 x 10 reps Single leg calf eccentrics (knee bent)   Do 2 sets of 15 reps Standing Plantar Fascia Mobilization with Small Ball  - 1 x daily - 7 x weekly - 2 sets - 10 reps Split Squat Forward Trunk  holding onto walking stick  2 sets - 10 reps 10 on R and 10 on L Bridge with Hamstring Curl on [employer]  3 sets - 10 reps Manual Therapy: STW and IASTYM ot R gastroc/achilles tendon  04-21-22 Eval, iniital HEP issued     PATIENT EDUCATION:  Education details: POC Explanation of findings, initial HEP Person educated: Patient Education method: Explanation, Demonstration, Tactile cues, Verbal cues, and Handouts Education comprehension: verbalized understanding, returned demonstration, verbal cues required, tactile cues required, and needs further education     HOME EXERCISE PROGRAM:   Access Code: [email or web address] URL: [email or web address] Date: 05/12/2022 Prepared by: Garen Lah  Program Notes Single leg calf eccentrics (knee bent) Hold onto a door frame or counter, sit with hips back, bend your knees and lower into 1/2 squat, Maintain angle, perform a double leg calf raise. At top position, shift your weight onto one leg and do a single leg eccentric into starting position. then lift again with both legs and repeat - two, up, one down   Do 2 sets of 15 reps  Exercises - Calf Mobilization with Foam Roll  - 1 x daily - 7 x weekly - 2 sets - 10 reps - Soleus Mobilization  - 1 x daily - 7 x weekly - 2 sets - 10 reps -  Standing Plantar Fascia Mobilization with Small Ball  - 1 x daily - 7 x weekly - 2 sets - 10 reps - Eccentric Plantar Fascia Strengthening on Step  - 1-2 x daily - 7 x weekly - 3 sets - 10 reps - Split Squat Forward Trunk with Dumbbells  - 1 x daily - 7 x weekly - 3 sets - 10 reps - Bridge with Hamstring Curl on Swiss Ball  - 1 x daily - 7 x weekly - 3 sets - 10 reps - Single Leg Isometric Heel Raise at Wall with [employer]  - 1 x daily - 7 x weekly - 1 sets - 5 reps - 45 hold - Single Leg Bridge  - 1 x daily - 7 x weekly - 3 sets - 10 reps ASSESSMENT:   CLINICAL IMPRESSION: Ms Graciano Pain is 1 /10 today and increased to 2/10 after 6 MWT   1660ft (norm for age [phone] ft) Pt ended RX with 2/10 pain .  All STG achieved and pt is currently working with Systems analyst.  Pt is improving but still has pain with descending steps and end range eccentric.  Will continue to achieve goals and peform progressive loading to increase capacity.     EVAL- Patient is a 76 y.o. female who was seen today for physical therapy evaluation and treatment for R achilles Tendonitis due to over compensation of R LE after L TKA and moving into new home.  Pt is active woman that is used to walking 5 miles a day but now is only able to walk for 1/4 mile due to pain up to 8/10. After trial of walking Cam boot for 6 weeks, pt pain is decreased but she does demonstrate some functional weakness in R LE ( See flow sheet)  Pt will benefit from skilled PT to address deficits..      OBJECTIVE IMPAIRMENTS decreased activity tolerance, decreased knowledge of condition, decreased mobility, difficulty walking, decreased ROM, decreased strength, and pain.    ACTIVITY LIMITATIONS standing, squatting, stairs, and locomotion level   PARTICIPATION LIMITATIONS: cleaning, laundry, shopping, community activity, and walking for exercise   PERSONAL FACTORS  L  TKA 10-18-21, osteoporosis are also affecting patient's functional outcome.     REHAB POTENTIAL: Excellent   CLINICAL DECISION MAKING: Evolving/moderate complexity  EVALUATION COMPLEXITY: Moderate     GOALS: Goals reviewed with patient? Yes   SHORT TERM GOALS: Target date: 05/12/2022  Patient will be independent and compliant with initial HEP Baseline:no knowledged for achilles tendonitis HEP Goal status:MET   2.  Patient will demonstrate ability to perform 6 MWT in age appropriate normal range Baseline: TBD at 2nd visit  05-12-22 6MWT 1666ft (norm for age [phone] ft) to begin Goal status: MET   3.  Patient will maintain SLS for at least 30 seconds on the RLE to improve stability when navigating on uneven terrain.  Baseline: 17 sec R 05-12-22  38 sec  Goal status: MET         LONG TERM GOALS: Target date: 06/02/2022    Pt will be independent with advanced HEP Baseline: limited knowledge Goal status: ONGOING   2.  Patient will report minimal difficulty with stair negotiation including no dependence with UE and alternating step descend Baseline: must use UE and one step at a time to go down step,  05-12-22  2/10 going down step. No pain going up step Goal status: ONGOING   3.  Patient will score at least 60/80 on the LEFS to signify clinically meaningful improvement in functional abilities Baseline: 46/80 Goal status: INITIAL   4.  Pt will be able to walk 1 mile with minimal discomfort to increase walking for exercise tolerance Baseline:  Goal status:    5.  FOTO will improve from 47%   to  68%   indicating improved functional mobility Baseline: eval 47% Goal status: INITIAL         PLAN: PT FREQUENCY: 2x/week   PT DURATION: 6 weeks   PLANNED INTERVENTIONS: Therapeutic exercises, Therapeutic activity, Neuromuscular re-education, Balance training, Gait training, Patient/Family education, Self Care, Joint mobilization, Stair training, Dry Needling, Electrical stimulation, Spinal mobilization, Cryotherapy, Moist heat, Taping, Ionotophoresis  $RemoveBeforeD'4mg'[id number]$ /ml Dexamethasone, Manual therapy, and Re-evaluation   PLAN FOR NEXT SESSION:  LEFS, FOTO and goals   Voncille Lo, PT, [place] Certified Exercise Expert for the Aging Adult  05/12/22 1:38 PM Phone: [phone] Fax: [phone]

## 2022-05-12 ENCOUNTER — Ambulatory Visit: Payer: Medicare Other | Admitting: Physical Therapy

## 2022-05-12 ENCOUNTER — Encounter: Payer: Self-pay | Admitting: Physical Therapy

## 2022-05-12 DIAGNOSIS — M6281 Muscle weakness (generalized): Secondary | ICD-10-CM | POA: Diagnosis not present

## 2022-05-12 DIAGNOSIS — M25562 Pain in left knee: Secondary | ICD-10-CM | POA: Diagnosis not present

## 2022-05-12 DIAGNOSIS — M25571 Pain in right ankle and joints of right foot: Secondary | ICD-10-CM

## 2022-05-12 DIAGNOSIS — R262 Difficulty in walking, not elsewhere classified: Secondary | ICD-10-CM

## 2022-05-12 DIAGNOSIS — G8929 Other chronic pain: Secondary | ICD-10-CM

## 2022-05-12 DIAGNOSIS — R600 Localized edema: Secondary | ICD-10-CM | POA: Diagnosis not present

## 2022-05-12 NOTE — Patient Instructions (Signed)
Access Code: [email or web address] URL: [email or web address] Date: 05/12/2022 Prepared by: Voncille Lo  Program Notes Single leg calf eccentrics (knee bent) Hold onto a door frame or counter, sit with hips back, bend your knees and lower into 1/2 squat, Maintain angle, perform a double leg calf raise. At top position, shift your weight onto one leg and do a single leg eccentric into starting position. then lift again with both legs and repeat - two, up, one down   Do 2 sets of 15 reps  Exercises - Calf Mobilization with Foam Roll  - 1 x daily - 7 x weekly - 2 sets - 10 reps - Soleus Mobilization  - 1 x daily - 7 x weekly - 2 sets - 10 reps - Standing Plantar Fascia Mobilization with Small Ball  - 1 x daily - 7 x weekly - 2 sets - 10 reps - Eccentric Plantar Fascia Strengthening on Step  - 1-2 x daily - 7 x weekly - 3 sets - 10 reps - Split Squat Forward Trunk with Dumbbells  - 1 x daily - 7 x weekly - 3 sets - 10 reps - Bridge with Hamstring Curl on Swiss Ball  - 1 x daily - 7 x weekly - 3 sets - 10 reps - Single Leg Isometric Heel Raise at Wall with [employer]  - 1 x daily - 7 x weekly - 1 sets - 5 reps - 45 hold - Single Leg Bridge  - 1 x daily - 7 x weekly - 3 sets - 10 reps Voncille Lo, PT, [place] Certified Exercise Expert for the Aging Adult  05/12/22 10:01 AM Phone: [phone] Fax: [phone]

## 2022-05-17 ENCOUNTER — Encounter: Payer: Medicare Other | Admitting: Physical Therapy

## 2022-05-19 ENCOUNTER — Ambulatory Visit: Payer: Medicare Other | Attending: Internal Medicine | Admitting: Physical Therapy

## 2022-05-19 DIAGNOSIS — G8929 Other chronic pain: Secondary | ICD-10-CM | POA: Diagnosis not present

## 2022-05-19 DIAGNOSIS — M6281 Muscle weakness (generalized): Secondary | ICD-10-CM | POA: Diagnosis not present

## 2022-05-19 DIAGNOSIS — R600 Localized edema: Secondary | ICD-10-CM | POA: Diagnosis not present

## 2022-05-19 DIAGNOSIS — M25571 Pain in right ankle and joints of right foot: Secondary | ICD-10-CM | POA: Diagnosis not present

## 2022-05-19 DIAGNOSIS — M25562 Pain in left knee: Secondary | ICD-10-CM | POA: Insufficient documentation

## 2022-05-19 DIAGNOSIS — R262 Difficulty in walking, not elsewhere classified: Secondary | ICD-10-CM | POA: Diagnosis not present

## 2022-05-19 NOTE — Therapy (Signed)
OUTPATIENT PHYSICAL THERAPY TREATMENT NOTE   Patient Name: Mary Garcia MRN: [id number] DOB:05-04-1946, 76 y.o., female 62 Date: 05/19/2022  PCP: Elby Showers, MD REFERRING PROVIDER: Armond Hang MD  END OF SESSION:   PT End of Session - 05/19/22 1419     Visit Number 6    Number of Visits 13    Date for PT Re-Evaluation 06/02/22    Authorization Type [employer]    PT Start Time 1418    PT Stop Time 1500    PT Time Calculation (min) 42 min    Activity Tolerance Patient tolerated treatment well    Behavior During Therapy WFL for tasks assessed/performed                 Past Medical History:  Diagnosis Date   Anxiety    Arthritis    Asthma    Bronchitis, acute    Past Surgical History:  Procedure Laterality Date   BREAST ENHANCEMENT SURGERY     CATARACT EXTRACTION W/ INTRAOCULAR LENS IMPLANT Bilateral    FACELIFT     local   ROTATOR CUFF REPAIR Right    TIBIA FRACTURE SURGERY  2004   left leg x3, tibia and fibula   TOTAL KNEE ARTHROPLASTY Left 10/18/2021   Procedure: TOTAL KNEE ARTHROPLASTY;  Surgeon: Vickey Huger, MD;  Location: [facility];  Service: Orthopedics;  Laterality: Left;   TUMOR EXCISION  2003   right muscle back, benign   Patient Active Problem List   Diagnosis Date Noted   Seasonal allergic rhinitis due to pollen 05/17/2021   Mild persistent asthma without complication 63/08/6008   Other allergic rhinitis 03/27/2019   DDD (degenerative disc disease), cervical 09/15/2017   Depression 02/02/2016   Arthropathy of right shoulder 02/02/2016   Insomnia 04/13/2011   Osteoporosis 04/13/2011   Biliary dyskinesia 04/13/2011   Osteoarthritis 04/13/2011   History of migraine headaches 04/13/2011   Vitamin D deficiency 04/13/2011   Anxiety 04/13/2011   Irritable bowel syndrome 04/13/2011    REFERRING DIAG: M76.61 (ICD-10-CM) - Achilles tendinitis, right leg  THERAPY DIAG:  Pain in right ankle and joints of right foot  Muscle weakness  (generalized)  Difficulty in walking, not elsewhere classified  Rationale for Evaluation and Treatment Rehabilitation  PERTINENT HISTORY: RTC R, Tibia fracture  2004/ rod removal, L  TKA 10-18-21, osteoporosis  PRECAUTIONS: none  SUBJECTIVE:  "been doing a lot more activity, especially since the family have left. I am still feeling soreness in the calf /achilles at about a 3/10 today.    PAIN:  Are you having pain? Yes: NPRS scale: 3/10  Pain location: R insertional achilles tendonitis Pain description: sharp, piercing throbbing Aggravating factors: standing for longer than 2 hours,  I don't walk more than 1/4 m Relieving factors: Meloxicam and Voltaren Can't walk more than 1/4 mile, limiting my usual 5 miles of walking, descending on steps worse than ascending.   OBJECTIVE: (objective measures completed at initial evaluation unless otherwise dated)  OBJECTIVE:    DIAGNOSTIC FINDINGS: none recent noted in Epic for R ankle   PATIENT SURVEYS:  LEFS 46/80  57.5% 05/19/2022 63/80 FOTO 47% predicted 68 05/19/2022 66% 6MWT 1655f (norm for age 76-1845ft)   COGNITION:           Overall cognitive status: Within functional limits for tasks assessed                          [facility]  MUSCLE LENGTH: Hamstrings: WNL     POSTURE: rounded shoulders, forward head, and anterior pelvic tilt WNL   PALPATION: TTP over insertional achilles R tendon   LOWER EXTREMITY ROM:   Active ROM Right eval Left eval  Hip flexion      Hip extension      Hip abduction      Hip adduction      Hip internal rotation      Hip external rotation      Knee flexion      Knee extension      Ankle dorsiflexion 12 slight pain in end range 10 old fibular fx with scress/plates  Ankle plantarflexion 40 52  Ankle inversion 23 30  Ankle eversion 35 21   (Blank rows = not tested)   LOWER EXTREMITY MMT:   MMT Right eval Left eval  Hip flexion 5 5  Hip extension   5  Hip abduction 4  5  Hip adduction      Hip internal rotation 4+ 5  Hip external rotation 4+ 5  Knee flexion      Knee extension      Ankle dorsiflexion 4+ 5  Ankle plantarflexion 4 5  Ankle inversion 4+ 5  Ankle eversion 4+ 5   (Blank rows = not tested)       FUNCTIONAL TESTS:  5 times sit to stand: 8.01 sec   GAIT: Distance walked: 150 feet Assistive device utilized: None Level of assistance: Complete Independence Comments: slight antalgic gait       TODAY'S TREATMENT: [facility] Adult PT Treatment:                                                DATE: 05/19/2022 Therapeutic Exercise: Contract/ relax long sitting gastroc stretch 3 x 30 secon with 10 sec contraction Long sitting heel raise with focus on eccentric loading x 5 seconds 2 x 20   -reviewed and updated HEP today Manual Therapy: MTPR along the gastroc/ soleus Active release techniques for the gastroc/ soleus Talocrural distraction mobs grad III    [facility] Adult PT Treatment:                                                DATE: 05-12-22 6MWT 1637f (norm for age [id number]) to begin Therapeutic Exercise:  Heel raise 3 x 10 at max range  bearing wt through great toe Eccentric Plantar Fascia Strengthening on Step   3 x 10 reps Kickstand deadlift with 15lb on R  3 x 10 with rest between walking it out 45 second isometric R heel raise straight knee  x 3 with 90 sec rest between reps 45 second isometric R heel raise with bent knee  x 3 with 90 sec rest between rep Single leg calf eccentrics (knee bent)   Do 2 sets of 15 reps Standing Plantar Fascia Mobilization with Small Ball  - 1 x daily - 7 x weekly - 2 sets - 10 reps Single limb bridge with 10 lb 3 x 10 on R SLS on there ex pad  over 3 min SLS on R LE with vectors with L for 3 min Manual Therapy: STW ot R gastroc/achilles tendon concentrating on medial  gastroc and medial achilles tendon  IASTYM   [facility] Adult PT Treatment:                                                DATE:  05-04-22 Therapeutic Exercise: Kickstand deadlift with 10 lb on R  3 x 10 with rest between 45 second isometric R heel raise straight knee  x 3 with 90 sec rest between reps 45 second isometric R heel raise with bent knee  x 3 with 90 sec rest between reps Eccentric Plantar Fascia Strengthening on Step   3 x 10 reps Single leg calf eccentrics (knee bent)   Do 2 sets of 15 reps Standing Plantar Fascia Mobilization with Small Ball  - 1 x daily - 7 x weekly - 2 sets - 10 reps  STW ot R gastroc/achilles tendon concentrating on medial gastroc and medial achilles tendon Trigger Point Dry Needling Treatment: Pre-treatment instruction: Patient instructed on dry needling rationale, procedures, and possible side effects including pain during treatment (achy,cramping feeling), bruising, drop of blood, lightheadedness, nausea, sweating. Patient Consent Given: Yes Education handout provided: Previously provided Muscles treated: Left medial gastroc, achilles tendon  Needle size and number: .30x22m x 4 Electrical stimulation performed: Yes Parameters:  25 pps to pt tolerance  alligator clips Treatment response/outcome: Palpable decrease in muscle tension Post-treatment instructions: Patient instructed to expect possible mild to moderate muscle soreness later today and/or tomorrow. Patient instructed in methods to reduce muscle soreness and to continue prescribed HEP. If patient was dry needled over the lung field, patient was instructed on signs and symptoms of pneumothorax and, however unlikely, to see immediate medical attention should they occur. Patient was also educated on signs and symptoms of infection and to seek medical attention should they occur. Patient verbalized understanding of these instructions and education.      PATIENT EDUCATION:  Education details: POC Explanation of findings, initial HEP Person educated: Patient Education method: Explanation, Demonstration, Tactile cues, Verbal cues,  and Handouts Education comprehension: verbalized understanding, returned demonstration, verbal cues required, tactile cues required, and needs further education     HOME EXERCISE PROGRAM:  Access Code: [email or web address] URL: [email or web address] Date: 05/19/2022 Prepared by: [place] Program Notes Single leg calf eccentrics (knee bent) Hold onto a door frame or counter, sit with hips back, bend your knees and lower into 1/2 squat, Maintain angle, perform a double leg calf raise. At top position, shift your weight onto one leg and do a single leg eccentric into starting position. then lift again with both legs and repeat - two, up, one down   Do 2 sets of 15 reps  Exercises - Calf Mobilization with Foam Roll  - 1 x daily - 7 x weekly - 2 sets - 10 reps - Soleus Mobilization  - 1 x daily - 7 x weekly - 2 sets - 10 reps - Standing Plantar Fascia Mobilization with Small Ball  - 1 x daily - 7 x weekly - 2 sets - 10 reps - Eccentric Plantar Fascia Strengthening on Step  - 1-2 x daily - 7 x weekly - 3 sets - 10 reps - Split Squat Forward Trunk with Dumbbells  - 1 x daily - 7 x weekly - 3 sets - 10 reps - Bridge with Hamstring Curl on Swiss Ball  - 1 x daily - 7 x  weekly - 3 sets - 10 reps - Single Leg Isometric Heel Raise at Wall with Swiss Ball  - 1 x daily - 7 x weekly - 1 sets - 5 reps - 45 hold - Single Leg Bridge  - 1 x daily - 7 x weekly - 3 sets - 10 reps - Long Sitting Eccentric Ankle Plantar Flexion with Resistance  - 1 x daily - 7 x weekly - 3 sets - 15 reps  CLINICAL IMPRESSION: Pt arrives to session reporting limited compliance with her HEP for the last week. She reports soreness/ stiffness in the R ankle. She is making progress with PT increasing LEFS to 63/80, and FOTO 66%. Continued STW along the R calf and performed active relax techniques. Trialed gentle LAD for Talocrural being mindful of pt hx of osteoporosis. Continued focus of eccentric loading in long sitting  which she noted relief of tension and pain. End of session she noted feeling better than when she came in.     OBJECTIVE IMPAIRMENTS decreased activity tolerance, decreased knowledge of condition, decreased mobility, difficulty walking, decreased ROM, decreased strength, and pain.    ACTIVITY LIMITATIONS standing, squatting, stairs, and locomotion level   PARTICIPATION LIMITATIONS: cleaning, laundry, shopping, community activity, and walking for exercise   PERSONAL FACTORS  L  TKA 10-18-21, osteoporosis are also affecting patient's functional outcome.    REHAB POTENTIAL: Excellent   CLINICAL DECISION MAKING: Evolving/moderate complexity   EVALUATION COMPLEXITY: Moderate     GOALS: Goals reviewed with patient? Yes   SHORT TERM GOALS: Target date: 05/12/2022  Patient will be independent and compliant with initial HEP Baseline:no knowledged for achilles tendonitis HEP Goal status:MET   2.  Patient will demonstrate ability to perform 6 MWT in age appropriate normal range Baseline: TBD at 2nd visit  05-12-22 6MWT 1634f (norm for age [id number]) to begin Goal status: MET   3.  Patient will maintain SLS for at least 30 seconds on the RLE to improve stability when navigating on uneven terrain.  Baseline: 17 sec R 05-12-22  38 sec  Goal status: MET         LONG TERM GOALS: Target date: 06/02/2022    Pt will be independent with advanced HEP Baseline: limited knowledge Goal status: ONGOING   2.  Patient will report minimal difficulty with stair negotiation including no dependence with UE and alternating step descend Baseline: must use UE and one step at a time to go down step,  05-12-22  2/10 going down step. No pain going up step Goal status: ONGOING   3.  Patient will score at least 60/80 on the LEFS to signify clinically meaningful improvement in functional abilities Baseline: 46/80 Goal status: MET 05/19/2022   4.  Pt will be able to walk 1 mile with minimal discomfort to  increase walking for exercise tolerance Baseline:  Goal status: MET 05/19/2022   5.  FOTO will improve from 47%   to  68%   indicating improved functional mobility Baseline: eval 47% Goal status: ONGOING 05/19/2022         PLAN: PT FREQUENCY: 2x/week   PT DURATION: 6 weeks   PLANNED INTERVENTIONS: Therapeutic exercises, Therapeutic activity, Neuromuscular re-education, Balance training, Gait training, Patient/Family education, Self Care, Joint mobilization, Stair training, Dry Needling, Electrical stimulation, Spinal mobilization, Cryotherapy, Moist heat, Taping, Ionotophoresis 411mml Dexamethasone, Manual therapy, and Re-evaluation   PLAN FOR NEXT SESSION:  KX modifiey Eccentric loading,  goals   Ellamay Fors PT, DPT, LAT, ATC  05/19/22  4:19 PM

## 2022-05-24 ENCOUNTER — Ambulatory Visit: Payer: Medicare Other | Admitting: Physical Therapy

## 2022-05-24 ENCOUNTER — Encounter: Payer: Self-pay | Admitting: Physical Therapy

## 2022-05-24 DIAGNOSIS — M25571 Pain in right ankle and joints of right foot: Secondary | ICD-10-CM | POA: Diagnosis not present

## 2022-05-24 DIAGNOSIS — R262 Difficulty in walking, not elsewhere classified: Secondary | ICD-10-CM

## 2022-05-24 DIAGNOSIS — G8929 Other chronic pain: Secondary | ICD-10-CM

## 2022-05-24 DIAGNOSIS — M6281 Muscle weakness (generalized): Secondary | ICD-10-CM

## 2022-05-24 DIAGNOSIS — R600 Localized edema: Secondary | ICD-10-CM | POA: Diagnosis not present

## 2022-05-24 DIAGNOSIS — M25562 Pain in left knee: Secondary | ICD-10-CM | POA: Diagnosis not present

## 2022-05-24 NOTE — Therapy (Signed)
OUTPATIENT PHYSICAL THERAPY TREATMENT NOTE   Patient Name: Mary Garcia MRN: [id number] DOB:11/20/1945, 76 y.o., female 3 Date: 05/24/2022  PCP: Elby Showers, MD REFERRING PROVIDER: Armond Hang MD  END OF SESSION:   PT End of Session - 05/24/22 0934     Visit Number 7    Number of Visits 13    Date for PT Re-Evaluation 06/02/22    Authorization Type [employer]    PT Start Time 0935    PT Stop Time 1015    PT Time Calculation (min) 40 min    Activity Tolerance Patient tolerated treatment well    Behavior During Therapy WFL for tasks assessed/performed                  Past Medical History:  Diagnosis Date   Anxiety    Arthritis    Asthma    Bronchitis, acute    Past Surgical History:  Procedure Laterality Date   BREAST ENHANCEMENT SURGERY     CATARACT EXTRACTION W/ INTRAOCULAR LENS IMPLANT Bilateral    FACELIFT     local   ROTATOR CUFF REPAIR Right    TIBIA FRACTURE SURGERY  2004   left leg x3, tibia and fibula   TOTAL KNEE ARTHROPLASTY Left 10/18/2021   Procedure: TOTAL KNEE ARTHROPLASTY;  Surgeon: Vickey Huger, MD;  Location: [facility];  Service: Orthopedics;  Laterality: Left;   TUMOR EXCISION  2003   right muscle back, benign   Patient Active Problem List   Diagnosis Date Noted   Seasonal allergic rhinitis due to pollen 05/17/2021   Mild persistent asthma without complication 20/94/7096   Other allergic rhinitis 03/27/2019   DDD (degenerative disc disease), cervical 09/15/2017   Depression 02/02/2016   Arthropathy of right shoulder 02/02/2016   Insomnia 04/13/2011   Osteoporosis 04/13/2011   Biliary dyskinesia 04/13/2011   Osteoarthritis 04/13/2011   History of migraine headaches 04/13/2011   Vitamin D deficiency 04/13/2011   Anxiety 04/13/2011   Irritable bowel syndrome 04/13/2011    REFERRING DIAG: M76.61 (ICD-10-CM) - Achilles tendinitis, right leg  THERAPY DIAG:  Pain in right ankle and joints of right foot  Muscle weakness  (generalized)  Difficulty in walking, not elsewhere classified  Chronic pain of left knee  Localized edema  Rationale for Evaluation and Treatment Rehabilitation  PERTINENT HISTORY: RTC R, Tibia fracture  2004/ rod removal, L  TKA 10-18-21, osteoporosis  PRECAUTIONS: none  SUBJECTIVE:  my Right foot is 2/10 today.  Last night I was in so much pain  5-6/10  because I was going up and down steps due to changing out my summer and winter clothes   PAIN:  Are you having pain? Yes: NPRS scale: 2/10 05-24-22 Pain location: R insertional achilles tendonitis Pain description: sharp, piercing throbbing Aggravating factors: standing for longer than 2 hours,  I don't walk more than 1/4 m Relieving factors: Meloxicam and Voltaren Can't walk more than 1/4 mile, limiting my usual 5 miles of walking, descending on steps worse than ascending.   OBJECTIVE: (objective measures completed at initial evaluation unless otherwise dated)  OBJECTIVE:    DIAGNOSTIC FINDINGS: none recent noted in Epic for R ankle   PATIENT SURVEYS:  LEFS 46/80  57.5% 05/19/2022 63/80 FOTO 47% predicted 68 05/19/2022 66% 6MWT 1648f (norm for age 18106-1845ft)   COGNITION:           Overall cognitive status: Within functional limits for tasks assessed  SENSATION: WFL       MUSCLE LENGTH: Hamstrings: WNL     POSTURE: rounded shoulders, forward head, and anterior pelvic tilt WNL   PALPATION: TTP over insertional achilles R tendon   LOWER EXTREMITY ROM:   Active ROM Right eval Left eval  Hip flexion      Hip extension      Hip abduction      Hip adduction      Hip internal rotation      Hip external rotation      Knee flexion      Knee extension      Ankle dorsiflexion 12 slight pain in end range 10 old fibular fx with scress/plates  Ankle plantarflexion 40 52  Ankle inversion 23 30  Ankle eversion 35 21   (Blank rows = not tested)   LOWER EXTREMITY MMT:   MMT  Right eval Left eval  Hip flexion 5 5  Hip extension   5  Hip abduction 4 5  Hip adduction      Hip internal rotation 4+ 5  Hip external rotation 4+ 5  Knee flexion      Knee extension      Ankle dorsiflexion 4+ 5  Ankle plantarflexion 4 5  Ankle inversion 4+ 5  Ankle eversion 4+ 5   (Blank rows = not tested)       FUNCTIONAL TESTS:  5 times sit to stand: 8.01 sec   GAIT: Distance walked: 150 feet Assistive device utilized: None Level of assistance: Complete Independence Comments: slight antalgic gait       TODAY'S TREATMENT: [facility] Adult PT Treatment:                                                DATE: 05-24-22 herapeutic Exercise:  Bil heel raise with UE support 1 x 15  knees bent Single leg calf eccentrics (knee bent)   Do 2 sets of 15 reps Contract/ relax long sitting gastroc stretch 3 x 30 secon with 10 sec contraction 4 in step downs 2 x 15 with R UE support Back against wall and Bil DF x 30 Long sitting heel raise with focus on eccentric loading x 5 seconds 2 x 20  Long sitting heel raise with 45 sec x 5 isometric hold and pain point  Manual Therapy: MTPR along the gastroc/ soleus Active release techniques for the gastroc/ soleus Talocrural distraction mobs grad III  [facility] Adult PT Treatment:                                                DATE: 05/19/2022 Therapeutic Exercise: Contract/ relax long sitting gastroc stretch 3 x 30 secon with 10 sec contraction Long sitting heel raise with focus on eccentric loading x 5 seconds 2 x 20   -reviewed and updated HEP today Manual Therapy: MTPR along the gastroc/ soleus Active release techniques for the gastroc/ soleus Talocrural distraction mobs grad III    [facility] Adult PT Treatment:  DATE: 05-12-22 6MWT 1640f (norm for age [phone]) to begin Therapeutic Exercise:  Heel raise 3 x 10 at max range  bearing wt through great toe Eccentric Plantar Fascia  Strengthening on Step   3 x 10 reps Kickstand deadlift with 15lb on R  3 x 10 with rest between walking it out 45 second isometric R heel raise straight knee  x 3 with 90 sec rest between reps 45 second isometric R heel raise with bent knee  x 3 with 90 sec rest between rep Single leg calf eccentrics (knee bent)   Do 2 sets of 15 reps Standing Plantar Fascia Mobilization with Small Ball  - 1 x daily - 7 x weekly - 2 sets - 10 reps Single limb bridge with 10 lb 3 x 10 on R SLS on there ex pad  over 3 min SLS on R LE with vectors with L for 3 min Manual Therapy: STW ot R gastroc/achilles tendon concentrating on medial gastroc and medial achilles tendon  IASTYM   [facility] Adult PT Treatment:                                                DATE: 05-04-22 Therapeutic Exercise: Kickstand deadlift with 10 lb on R  3 x 10 with rest between 45 second isometric R heel raise straight knee  x 3 with 90 sec rest between reps 45 second isometric R heel raise with bent knee  x 3 with 90 sec rest between reps Eccentric Plantar Fascia Strengthening on Step   3 x 10 reps Single leg calf eccentrics (knee bent)   Do 2 sets of 15 reps Standing Plantar Fascia Mobilization with Small Ball  - 1 x daily - 7 x weekly - 2 sets - 10 reps  STW ot R gastroc/achilles tendon concentrating on medial gastroc and medial achilles tendon Trigger Point Dry Needling Treatment: Pre-treatment instruction: Patient instructed on dry needling rationale, procedures, and possible side effects including pain during treatment (achy,cramping feeling), bruising, drop of blood, lightheadedness, nausea, sweating. Patient Consent Given: Yes Education handout provided: Previously provided Muscles treated: Left medial gastroc, achilles tendon  Needle size and number: .[id number] 4 Electrical stimulation performed: Yes Parameters:  25 pps to pt tolerance  alligator clips Treatment response/outcome: Palpable decrease in muscle  tension Post-treatment instructions: Patient instructed to expect possible mild to moderate muscle soreness later today and/or tomorrow. Patient instructed in methods to reduce muscle soreness and to continue prescribed HEP. If patient was dry needled over the lung field, patient was instructed on signs and symptoms of pneumothorax and, however unlikely, to see immediate medical attention should they occur. Patient was also educated on signs and symptoms of infection and to seek medical attention should they occur. Patient verbalized understanding of these instructions and education.      PATIENT EDUCATION:  Education details: POC Explanation of findings, initial HEP Person educated: Patient Education method: Explanation, Demonstration, Tactile cues, Verbal cues, and Handouts Education comprehension: verbalized understanding, returned demonstration, verbal cues required, tactile cues required, and needs further education     HOME EXERCISE PROGRAM:  Access Code: [email or web address] URL: [email or web address] Date: 05/19/2022 Prepared by: KrStarr LakeProgram Notes Single leg calf eccentrics (knee bent) Hold onto a door frame or counter, sit with hips back, bend your knees and lower into 1/2 squat, Maintain angle, perform  a double leg calf raise. At top position, shift your weight onto one leg and do a single leg eccentric into starting position. then lift again with both legs and repeat - two, up, one down   Do 2 sets of 15 reps  Exercises - Calf Mobilization with Foam Roll  - 1 x daily - 7 x weekly - 2 sets - 10 reps - Soleus Mobilization  - 1 x daily - 7 x weekly - 2 sets - 10 reps - Standing Plantar Fascia Mobilization with Small Ball  - 1 x daily - 7 x weekly - 2 sets - 10 reps - Eccentric Plantar Fascia Strengthening on Step  - 1-2 x daily - 7 x weekly - 3 sets - 10 reps - Split Squat Forward Trunk with Dumbbells  - 1 x daily - 7 x weekly - 3 sets - 10 reps - Bridge with  Hamstring Curl on Swiss Ball  - 1 x daily - 7 x weekly - 3 sets - 10 reps - Single Leg Isometric Heel Raise at [employer] with [employer]  - 1 x daily - 7 x weekly - 1 sets - 5 reps - 45 hold - Single Leg Bridge  - 1 x daily - 7 x weekly - 3 sets - 10 reps - Long Sitting Eccentric Ankle Plantar Flexion with Resistance  - 1 x daily - 7 x weekly - 3 sets - 15 reps  CLINICAL IMPRESSION:  Ms Kama presents to clinic with 2/10 pain but did complain of increased pain over the weekend with multiple trips up and down steps up to 5/10 pain.. Most pain with descending.  Continued with exercise followed by manual.  gentle LAD for Talocrural being mindful of pt hx of osteoporosis. Continued focus of eccentric loading in long sitting  and standing which she noted relief of tension and pain and especially with 45 sec isometric hold . End of session she noted feeling better than when she came in.      OBJECTIVE IMPAIRMENTS decreased activity tolerance, decreased knowledge of condition, decreased mobility, difficulty walking, decreased ROM, decreased strength, and pain.    ACTIVITY LIMITATIONS standing, squatting, stairs, and locomotion level   PARTICIPATION LIMITATIONS: cleaning, laundry, shopping, community activity, and walking for exercise   PERSONAL FACTORS  L  TKA 10-18-21, osteoporosis are also affecting patient's functional outcome.    REHAB POTENTIAL: Excellent   CLINICAL DECISION MAKING: Evolving/moderate complexity   EVALUATION COMPLEXITY: Moderate     GOALS: Goals reviewed with patient? Yes   SHORT TERM GOALS: Target date: 05/12/2022  Patient will be independent and compliant with initial HEP Baseline:no knowledged for achilles tendonitis HEP Goal status:MET   2.  Patient will demonstrate ability to perform 6 MWT in age appropriate normal range Baseline: TBD at 2nd visit  05-12-22 6MWT 1649f (norm for age [phone]) to begin Goal status: MET   3.  Patient will maintain SLS for at least 30  seconds on the RLE to improve stability when navigating on uneven terrain.  Baseline: 17 sec R 05-12-22  38 sec  Goal status: MET         LONG TERM GOALS: Target date: 06/02/2022    Pt will be independent with advanced HEP Baseline: limited knowledge Goal status: ONGOING   2.  Patient will report minimal difficulty with stair negotiation including no dependence with UE and alternating step descend Baseline: must use UE and one step at a time to go down step,  05-12-22  2/10 going down step. No pain going up step Goal status: ONGOING   3.  Patient will score at least 60/80 on the LEFS to signify clinically meaningful improvement in functional abilities Baseline: 46/80 Goal status: MET 05/19/2022   4.  Pt will be able to walk 1 mile with minimal discomfort to increase walking for exercise tolerance Baseline:  Goal status: MET 05/19/2022   5.  FOTO will improve from 47%   to  68%   indicating improved functional mobility Baseline: eval 47% Goal status: ONGOING 05/19/2022         PLAN: PT FREQUENCY: 2x/week   PT DURATION: 6 weeks   PLANNED INTERVENTIONS: Therapeutic exercises, Therapeutic activity, Neuromuscular re-education, Balance training, Gait training, Patient/Family education, Self Care, Joint mobilization, Stair training, Dry Needling, Electrical stimulation, Spinal mobilization, Cryotherapy, Moist heat, Taping, Ionotophoresis 24m/ml Dexamethasone, Manual therapy, and Re-evaluation   PLAN FOR NEXT SESSION:  KX modifiey Eccentric loading,  goals  MMR PF and MMT and DF AROM   LVoncille Lo PT, ADuboisCertified Exercise Expert for the Aging Adult  05/24/22 12:00 PM Phone: [phone]: [phone]

## 2022-05-24 NOTE — Therapy (Signed)
OUTPATIENT PHYSICAL THERAPY TREATMENT NOTE   Patient Name: Mary Garcia MRN: [id number] DOB:10/09/1945, 76 y.o., female 38 Date: 05/26/2022  PCP: Elby Showers, MD REFERRING PROVIDER: Armond Hang MD  END OF SESSION:   PT End of Session - 05/26/22 0937     Visit Number 8    Number of Visits 13    Date for PT Re-Evaluation 06/02/22    Authorization Type BC[employer]    PT Start Time 0935    PT Stop Time 1021    PT Time Calculation (min) 46 min    Activity Tolerance Patient tolerated treatment well    Behavior During Therapy WFL for tasks assessed/performed                   Past Medical History:  Diagnosis Date   Anxiety    Arthritis    Asthma    Bronchitis, acute    Past Surgical History:  Procedure Laterality Date   BREAST ENHANCEMENT SURGERY     CATARACT EXTRACTION W/ INTRAOCULAR LENS IMPLANT Bilateral    FACELIFT     local   ROTATOR CUFF REPAIR Right    TIBIA FRACTURE SURGERY  2004   left leg x3, tibia and fibula   TOTAL KNEE ARTHROPLASTY Left 10/18/2021   Procedure: TOTAL KNEE ARTHROPLASTY;  Surgeon: Vickey Huger, MD;  Location: [facility];  Service: Orthopedics;  Laterality: Left;   TUMOR EXCISION  2003   right muscle back, benign   Patient Active Problem List   Diagnosis Date Noted   Seasonal allergic rhinitis due to pollen 05/17/2021   Mild persistent asthma without complication 02/33/4356   Other allergic rhinitis 03/27/2019   DDD (degenerative disc disease), cervical 09/15/2017   Depression 02/02/2016   Arthropathy of right shoulder 02/02/2016   Insomnia 04/13/2011   Osteoporosis 04/13/2011   Biliary dyskinesia 04/13/2011   Osteoarthritis 04/13/2011   History of migraine headaches 04/13/2011   Vitamin D deficiency 04/13/2011   Anxiety 04/13/2011   Irritable bowel syndrome 04/13/2011    REFERRING DIAG: M76.61 (ICD-10-CM) - Achilles tendinitis, right leg  THERAPY DIAG:  Pain in right ankle and joints of right foot  Muscle weakness  (generalized)  Difficulty in walking, not elsewhere classified  Chronic pain of left knee  Localized edema  Rationale for Evaluation and Treatment Rehabilitation  PERTINENT HISTORY: RTC R, Tibia fracture  2004/ rod removal, L  TKA 10-18-21, osteoporosis  PRECAUTIONS: none  SUBJECTIVE:  my Right foot is 2/10 today.in the morning.  I did a party for my husband and I was on my feet yesterday and I was a 6/10.      PAIN:  Are you having pain? Yes: NPRS scale: 2/10 05-26-22 Pain location: R insertional achilles tendonitis Pain description: sharp, piercing throbbing Aggravating factors: standing for longer than 2 hours,  I don't walk more than 1/4 m Relieving factors: Meloxicam and Voltaren Can't walk more than 1/4 mile, limiting my usual 5 miles of walking, descending on steps worse than ascending.   OBJECTIVE: (objective measures completed at initial evaluation unless otherwise dated)  OBJECTIVE:    DIAGNOSTIC FINDINGS: none recent noted in Epic for R ankle   PATIENT SURVEYS:  LEFS 46/80  57.5% 05/19/2022 63/80 FOTO 47% predicted 68 05/19/2022 66% 6MWT 16110ft (norm for age 72-1845 ft)   COGNITION:           Overall cognitive status: Within functional limits for tasks assessed  SENSATION: WFL       MUSCLE LENGTH: Hamstrings: WNL     POSTURE: rounded shoulders, forward head, and anterior pelvic tilt WNL   PALPATION: TTP over insertional achilles R tendon   LOWER EXTREMITY ROM:   Active ROM Right eval Left eval RIGHT 05-26-22  Hip flexion       Hip extension       Hip abduction       Hip adduction       Hip internal rotation       Hip external rotation       Knee flexion       Knee extension       Ankle dorsiflexion 12 slight pain in end range 10 old fibular fx with scress/plates 12  Ankle plantarflexion 40 52 45  Ankle inversion $RemoveBeforeD'23 30 25  '[id number]$ Ankle eversion 35 21 36   (Blank rows = not tested)   LOWER EXTREMITY MMT:   MMT  Right eval Left eval  Hip flexion 5 5  Hip extension   5  Hip abduction 4 5  Hip adduction      Hip internal rotation 4+ 5  Hip external rotation 4+ 5  Knee flexion      Knee extension      Ankle dorsiflexion 4+ 5  Ankle plantarflexion 4 5  Ankle inversion 4+ 5  Ankle eversion 4+ 5   (Blank rows = not tested)       FUNCTIONAL TESTS:  5 times sit to stand: 8.01 sec   GAIT: Distance walked: 150 feet Assistive device utilized: None Level of assistance: Complete Independence Comments: slight antalgic gait       TODAY'S TREATMENT: [facility] Adult PT Treatment:                                                DATE: 05-26-22 Bil heel raise with UE support 1 x 15  knees bent Carma Leaven with R SLS and L leg pressed into wall Great toe flexion with GTB x 20 Single leg calf eccentrics (knee bent)   Do 2 sets of 15 reps Contract/ relax long sitting gastroc stretch 3 x 30 secon with 10 sec contraction 4 in step downs 2 x 15 with R UE support Back against wall and Bil DF x 30 Downward dog to push up x 5  Toe walking. Lunge on R x 10 with UE support . Manual Therapy: MTPR along the gastroc/ soleus Active release techniques for the gastroc/ soleus Talocrural distraction mobs grad III Modalities  Iontophoresis 1 cc Dexamethasone in patch to be removed in 4-6 hours  handout given to Pt for precautions and use/discard instructions.   [facility] Adult PT Treatment:                                                DATE: 05-24-22 herapeutic Exercise:  Bil heel raise with UE support 2 x 15  knees bent Standing heel raise with 45 sec x 5 isometric hold and pain point Single leg calf eccentrics (knee bent)   Do 2 sets of 15 reps Contract/ relax long sitting gastroc stretch 3 x 30 secon with 10 sec contraction 4 in step downs 2 x 15  with R UE support Back against wall and Bil DF x 30 Long sitting heel raise with focus on eccentric loading x 5 seconds 2 x 20    Manual Therapy: MTPR along the  gastroc/ soleus Active release techniques for the gastroc/ soleus Talocrural distraction mobs grad III  [facility] Adult PT Treatment:                                                DATE: 05/19/2022 Therapeutic Exercise: Contract/ relax long sitting gastroc stretch 3 x 30 secon with 10 sec contraction Long sitting heel raise with focus on eccentric loading x 5 seconds 2 x 20   -reviewed and updated HEP today Manual Therapy: MTPR along the gastroc/ soleus Active release techniques for the gastroc/ soleus Talocrural distraction mobs grad III    [facility] Adult PT Treatment:                                                DATE: 05-12-22 6MWT 1645ft (norm for age [phone]) to begin Therapeutic Exercise:  Heel raise 3 x 10 at max range  bearing wt through great toe Eccentric Plantar Fascia Strengthening on Step   3 x 10 reps Kickstand deadlift with 15lb on R  3 x 10 with rest between walking it out 45 second isometric R heel raise straight knee  x 3 with 90 sec rest between reps 45 second isometric R heel raise with bent knee  x 3 with 90 sec rest between rep Single leg calf eccentrics (knee bent)   Do 2 sets of 15 reps Standing Plantar Fascia Mobilization with Small Ball  - 1 x daily - 7 x weekly - 2 sets - 10 reps Single limb bridge with 10 lb 3 x 10 on R SLS on there ex pad  over 3 min SLS on R LE with vectors with L for 3 min Manual Therapy: STW ot R gastroc/achilles tendon concentrating on medial gastroc and medial achilles tendon  IASTYM   [facility] Adult PT Treatment:                                                DATE: 05-04-22 Therapeutic Exercise: Kickstand deadlift with 10 lb on R  3 x 10 with rest between 45 second isometric R heel raise straight knee  x 3 with 90 sec rest between reps 45 second isometric R heel raise with bent knee  x 3 with 90 sec rest between reps Eccentric Plantar Fascia Strengthening on Step   3 x 10 reps Single leg calf eccentrics (knee bent)   Do 2 sets of 15  reps Standing Plantar Fascia Mobilization with Small Ball  - 1 x daily - 7 x weekly - 2 sets - 10 reps  STW ot R gastroc/achilles tendon concentrating on medial gastroc and medial achilles tendon Trigger Point Dry Needling Treatment: Pre-treatment instruction: Patient instructed on dry needling rationale, procedures, and possible side effects including pain during treatment (achy,cramping feeling), bruising, drop of blood, lightheadedness, nausea, sweating. Patient Consent Given: Yes Education handout provided: Previously provided Muscles  treated: Left medial gastroc, achilles tendon  Needle size and number: .30x84mm x 4 Electrical stimulation performed: Yes Parameters:  25 pps to pt tolerance  alligator clips Treatment response/outcome: Palpable decrease in muscle tension Post-treatment instructions: Patient instructed to expect possible mild to moderate muscle soreness later today and/or tomorrow. Patient instructed in methods to reduce muscle soreness and to continue prescribed HEP. If patient was dry needled over the lung field, patient was instructed on signs and symptoms of pneumothorax and, however unlikely, to see immediate medical attention should they occur. Patient was also educated on signs and symptoms of infection and to seek medical attention should they occur. Patient verbalized understanding of these instructions and education.      PATIENT EDUCATION:  Education details: POC Explanation of findings, initial HEP Person educated: Patient Education method: Explanation, Demonstration, Tactile cues, Verbal cues, and Handouts Education comprehension: verbalized understanding, returned demonstration, verbal cues required, tactile cues required, and needs further education     HOME EXERCISE PROGRAM:  Access Code: [email or web address] URL: [email or web address] Date: 05/26/2022 Prepared by: Voncille Lo  Program Notes Single leg calf eccentrics (knee bent) Hold onto a  door frame or counter, sit with hips back, bend your knees and lower into 1/2 squat, Maintain angle, perform a double leg calf raise. At top position, shift your weight onto one leg and do a single leg eccentric into starting position. then lift again with both legs and repeat - two, up, one down   Do 2 sets of 15 repsCaptain Morgan exercise   hold for 1 min rest and 2 more rounds  Exercises - Calf Mobilization with Foam Roll  - 1 x daily - 7 x weekly - 2 sets - 10 reps - Soleus Mobilization  - 1 x daily - 7 x weekly - 2 sets - 10 reps - Standing Plantar Fascia Mobilization with Small Ball  - 1 x daily - 7 x weekly - 2 sets - 10 reps - Eccentric Plantar Fascia Strengthening on Step  - 1-2 x daily - 7 x weekly - 3 sets - 10 reps - Split Squat Forward Trunk with Dumbbells  - 1 x daily - 7 x weekly - 3 sets - 10 reps - Bridge with Hamstring Curl on Swiss Ball  - 1 x daily - 7 x weekly - 3 sets - 10 reps - Single Leg Isometric Heel Raise at [employer] with [employer]  - 1 x daily - 7 x weekly - 1 sets - 5 reps - 45 hold - Single Leg Bridge  - 1 x daily - 7 x weekly - 3 sets - 10 reps - Long Sitting Eccentric Ankle Plantar Flexion with Resistance  - 1 x daily - 7 x weekly - 3 sets - 15 reps  CLINICAL IMPRESSION:  Ms Mccurley presents to clinic with 2/10 pain but did complain of increased pain yesterday being on feet to prepare for a birthday.with multiple trips up and down steps up to 6/10 pain. Tried some different movements and strengthening of medial/transverse arch and great toe ext/flex  Continued with exercise followed by manual.  gentle LAD for Talocrural being mindful of pt hx of osteoporosis. Continued focus of eccentric loading standing which she noted relief of tension and pain and especially with 45 sec isometric hold.  Due to pt R lateral ankle irritation, used iontophoresis to try decreased of inflammation.  Medial arch with no pain today. End of session she noted feeling better than when she came in  1/10      OBJECTIVE IMPAIRMENTS decreased activity tolerance, decreased knowledge of condition, decreased mobility, difficulty walking, decreased ROM, decreased strength, and pain.    ACTIVITY LIMITATIONS standing, squatting, stairs, and locomotion level   PARTICIPATION LIMITATIONS: cleaning, laundry, shopping, community activity, and walking for exercise   PERSONAL FACTORS  L  TKA 10-18-21, osteoporosis are also affecting patient's functional outcome.    REHAB POTENTIAL: Excellent   CLINICAL DECISION MAKING: Evolving/moderate complexity   EVALUATION COMPLEXITY: Moderate     GOALS: Goals reviewed with patient? Yes   SHORT TERM GOALS: Target date: 05/12/2022  Patient will be independent and compliant with initial HEP Baseline:no knowledged for achilles tendonitis HEP Goal status:MET   2.  Patient will demonstrate ability to perform 6 MWT in age appropriate normal range Baseline: TBD at 2nd visit  05-12-22 6MWT 162ft (norm for age [phone] ft) to begin Goal status: MET   3.  Patient will maintain SLS for at least 30 seconds on the RLE to improve stability when navigating on uneven terrain.  Baseline: 17 sec R 05-12-22  38 sec  Goal status: MET         LONG TERM GOALS: Target date: 06/02/2022    Pt will be independent with advanced HEP Baseline: limited knowledge Goal status: ONGOING   2.  Patient will report minimal difficulty with stair negotiation including no dependence with UE and alternating step descend Baseline: must use UE and one step at a time to go down step,  05-12-22  2/10 going down step. No pain going up step Goal status: ONGOING   3.  Patient will score at least 60/80 on the LEFS to signify clinically meaningful improvement in functional abilities Baseline: 46/80 Goal status: MET 05/19/2022   4.  Pt will be able to walk 1 mile with minimal discomfort to increase walking for exercise tolerance Baseline:  Goal status: MET 05/19/2022   5.  FOTO will  improve from 47%   to  68%   indicating improved functional mobility Baseline: eval 47% 05/19/2022 66% Goal status: ONGOING 05/19/2022         PLAN: PT FREQUENCY: 2x/week   PT DURATION: 6 weeks   PLANNED INTERVENTIONS: Therapeutic exercises, Therapeutic activity, Neuromuscular re-education, Balance training, Gait training, Patient/Family education, Self Care, Joint mobilization, Stair training, Dry Needling, Electrical stimulation, Spinal mobilization, Cryotherapy, Moist heat, Taping, Ionotophoresis 4mg /ml Dexamethasone, Manual therapy, and Re-evaluation   PLAN FOR NEXT SESSION:  KX modifiey Eccentric loading,   check iontophoresis  reeval next visit   Voncille Lo, PT, [place] Certified Exercise Expert for the Aging Adult  05/26/22 10:37 AM Phone: [phone] Fax: [phone]

## 2022-05-26 ENCOUNTER — Ambulatory Visit: Payer: Medicare Other | Admitting: Physical Therapy

## 2022-05-26 ENCOUNTER — Encounter: Payer: Self-pay | Admitting: Physical Therapy

## 2022-05-26 DIAGNOSIS — M25571 Pain in right ankle and joints of right foot: Secondary | ICD-10-CM | POA: Diagnosis not present

## 2022-05-26 DIAGNOSIS — G8929 Other chronic pain: Secondary | ICD-10-CM

## 2022-05-26 DIAGNOSIS — M25562 Pain in left knee: Secondary | ICD-10-CM | POA: Diagnosis not present

## 2022-05-26 DIAGNOSIS — R262 Difficulty in walking, not elsewhere classified: Secondary | ICD-10-CM

## 2022-05-26 DIAGNOSIS — M6281 Muscle weakness (generalized): Secondary | ICD-10-CM

## 2022-05-26 DIAGNOSIS — R600 Localized edema: Secondary | ICD-10-CM | POA: Diagnosis not present

## 2022-05-26 NOTE — Patient Instructions (Signed)
IONTOPHORESIS PATIENT PRECAUTIONS & CONTRAINDICATIONS:  Redness under one or both electrodes can occur.  This characterized by a uniform redness that usually disappears within 12 hours of treatment. Small pinhead size blisters may result in response to the drug.  Contact your physician if the problem persists more than 24 hours. On rare occasions, iontophoresis therapy can result in temporary skin reactions such as rash, inflammation, irritation or burns.  The skin reactions may be the result of individual sensitivity to the ionic solution used, the condition of the skin at the start of treatment, reaction to the materials in the electrodes, allergies or sensitivity to dexamethasone, or a poor connection between the patch and your skin.  Discontinue using iontophoresis if you have any of these reactions and report to your therapist. Remove the Patch or electrodes if you have any undue sensation of pain or burning during the treatment and report discomfort to your therapist. Tell your Therapist if you have had known adverse reactions to the application of electrical current. If using the Patch, the LED light will turn off when treatment is complete and the patch can be removed.  Approximate treatment time is 1-3 hours.  Remove the patch when light goes off or after 6 hours. The Patch can be worn during normal activity, however excessive motion where the electrodes have been placed can cause poor contact between the skin and the electrode or uneven electrical current resulting in greater risk of skin irritation. Keep out of the reach of children.   DO NOT use if you have a cardiac pacemaker or any other electrically sensitive implanted device. DO NOT use if you have a known sensitivity to dexamethasone. DO NOT use during Magnetic Resonance Imaging (MRI). DO NOT use over broken or compromised skin (e.g. sunburn, cuts, or acne) due to the increased risk of skin reaction. DO NOT SHAVE over the area to  be treated:  To establish good contact between the Patch and the skin, excessive hair may be clipped. DO NOT place the Patch or electrodes on or over your eyes, directly over your heart, or brain. DO NOT reuse the Patch or electrodes as this may cause burns to occur.  Voncille Lo, PT, [place] Certified Exercise Expert for the Aging Adult  05/26/22 10:18 AM Phone: [phone] Fax: [phone]

## 2022-05-30 ENCOUNTER — Telehealth: Payer: Self-pay | Admitting: Internal Medicine

## 2022-05-30 NOTE — Telephone Encounter (Signed)
Received Fax RX request from  [facility], [place] Phone: [phone]  Fax: [phone]      Medication - ibandronate (BONIVA) 150 MG tablet   Last Refill - 03/03/22  Last OV - 05/07/22  Last CPE - 11/08/21  Next Appointment - 11/22/22

## 2022-05-31 ENCOUNTER — Encounter: Payer: Self-pay | Admitting: Physical Therapy

## 2022-05-31 ENCOUNTER — Ambulatory Visit: Payer: Medicare Other | Admitting: Physical Therapy

## 2022-05-31 DIAGNOSIS — M6281 Muscle weakness (generalized): Secondary | ICD-10-CM

## 2022-05-31 DIAGNOSIS — G8929 Other chronic pain: Secondary | ICD-10-CM

## 2022-05-31 DIAGNOSIS — M25562 Pain in left knee: Secondary | ICD-10-CM | POA: Diagnosis not present

## 2022-05-31 DIAGNOSIS — R262 Difficulty in walking, not elsewhere classified: Secondary | ICD-10-CM | POA: Diagnosis not present

## 2022-05-31 DIAGNOSIS — R600 Localized edema: Secondary | ICD-10-CM

## 2022-05-31 DIAGNOSIS — M25571 Pain in right ankle and joints of right foot: Secondary | ICD-10-CM | POA: Diagnosis not present

## 2022-05-31 MED ORDER — IBANDRONATE SODIUM 150 MG PO TABS: 150.0000 mg | ORAL_TABLET | ORAL | 3 refills | Status: DC

## 2022-05-31 NOTE — Therapy (Signed)
OUTPATIENT PHYSICAL THERAPY TREATMENT NOTE/REEVALUATION   Patient Name: SYNAI PRETTYMAN MRN: [id number] DOB:1946-05-13, 76 y.o., female 2 Date: 05/31/2022  PCP: Elby Showers, MD REFERRING PROVIDER: Armond Hang MD  END OF SESSION:   PT End of Session - 05/31/22 0934     Visit Number 9    Number of Visits 15    Date for PT Re-Evaluation 07/12/22    Authorization Type BC[employer]    PT Start Time [id number]    PT Stop Time 1017    PT Time Calculation (min) 43 min    Activity Tolerance Patient tolerated treatment well    Behavior During Therapy WFL for tasks assessed/performed                    Past Medical History:  Diagnosis Date   Anxiety    Arthritis    Asthma    Bronchitis, acute    Past Surgical History:  Procedure Laterality Date   BREAST ENHANCEMENT SURGERY     CATARACT EXTRACTION W/ INTRAOCULAR LENS IMPLANT Bilateral    FACELIFT     local   ROTATOR CUFF REPAIR Right    TIBIA FRACTURE SURGERY  2004   left leg x3, tibia and fibula   TOTAL KNEE ARTHROPLASTY Left 10/18/2021   Procedure: TOTAL KNEE ARTHROPLASTY;  Surgeon: Vickey Huger, MD;  Location: [facility];  Service: Orthopedics;  Laterality: Left;   TUMOR EXCISION  2003   right muscle back, benign   Patient Active Problem List   Diagnosis Date Noted   Seasonal allergic rhinitis due to pollen 05/17/2021   Mild persistent asthma without complication 47/65/4650   Other allergic rhinitis 03/27/2019   DDD (degenerative disc disease), cervical 09/15/2017   Depression 02/02/2016   Arthropathy of right shoulder 02/02/2016   Insomnia 04/13/2011   Osteoporosis 04/13/2011   Biliary dyskinesia 04/13/2011   Osteoarthritis 04/13/2011   History of migraine headaches 04/13/2011   Vitamin D deficiency 04/13/2011   Anxiety 04/13/2011   Irritable bowel syndrome 04/13/2011    REFERRING DIAG: M76.61 (ICD-10-CM) - Achilles tendinitis, right leg  THERAPY DIAG:  Pain in right ankle and joints of right foot - Plan:  PT plan of care cert/re-cert  Muscle weakness (generalized) - Plan: PT plan of care cert/re-cert  Difficulty in walking, not elsewhere classified - Plan: PT plan of care cert/re-cert  Chronic pain of left knee - Plan: PT plan of care cert/re-cert  Localized edema - Plan: PT plan of care cert/re-cert  Rationale for Evaluation and Treatment Rehabilitation  PERTINENT HISTORY: RTC R, Tibia fracture  2004/ rod removal, L  TKA 10-18-21, osteoporosis  PRECAUTIONS: none  SUBJECTIVE:  my Right foot is 1/10 today.in the morning.  I did get a very bruised on my R high ankle (lateral) when I hit it against a step. I was walking from one room to another and I hit it on a step. The iontophoresis did help my pain. I did ride my bike this  weekend and It felt good.  I still have pain on my achilles on medial and Lateral sides.   PAIN:  Are you having pain? Yes: NPRS scale: 10-17 -23  1/10 Pain location: R insertional achilles tendonitis Pain description: sharp, piercing throbbing Aggravating factors: standing for longer than 2 hours,  I don't walk more than 1/4 m Relieving factors: Meloxicam and Voltaren Can't walk more than 1/4 mile, limiting my usual 5 miles of walking, descending on steps worse than ascending.   OBJECTIVE: (objective  measures completed at initial evaluation unless otherwise dated)  OBJECTIVE:    DIAGNOSTIC FINDINGS: none recent noted in Epic for R ankle   PATIENT SURVEYS:  LEFS 46/80  57.5% 05/19/2022 63/80 05-31-22   FOTO 47% predicted 68 05/19/2022 66% 6MWT 1672ft (norm for age 91-1845 ft)   COGNITION:           Overall cognitive status: Within functional limits for tasks assessed                          SENSATION: WFL       MUSCLE LENGTH: Hamstrings: WNL     POSTURE: rounded shoulders, forward head, and anterior pelvic tilt WNL   PALPATION: TTP over insertional achilles R tendon   LOWER EXTREMITY ROM:   Active ROM Right eval Left eval RIGHT 05-26-22   Hip flexion       Hip extension       Hip abduction       Hip adduction       Hip internal rotation       Hip external rotation       Knee flexion       Knee extension       Ankle dorsiflexion 12 slight pain in end range 10 old fibular fx with scress/plates 12  Ankle plantarflexion 40 52 45  Ankle inversion $RemoveBeforeD'23 30 25  'nNoKRMbxiMkSAr$ Ankle eversion 35 21 36   (Blank rows = not tested)   LOWER EXTREMITY MMT:   MMT Right eval Left eval  Hip flexion 5 5  Hip extension   5  Hip abduction 4 5  Hip adduction      Hip internal rotation 4+ 5  Hip external rotation 4+ 5  Knee flexion      Knee extension      Ankle dorsiflexion 4+ 5  Ankle plantarflexion 4 5  Ankle inversion 4+ 5  Ankle eversion 4+ 5   (Blank rows = not tested)       FUNCTIONAL TESTS:  5 times sit to stand: 8.01 sec   GAIT: Distance walked: 150 feet Assistive device utilized: None Level of assistance: Complete Independence Comments: slight antalgic gait       TODAY'S TREATMENT: [facility] Adult PT Treatment:                                                DATE: 05-31-22 Therapeutic Exercise: Heel raises BIL with 10 lb in Left hand, and 15 # in R hand 3 x 10 On 6 inch step  SL heel raise with 10 # in R hand 3 x 10 March in place with 10 lb  for hip flexor 3 x 10 Standing heel raise with 45 sec x 5 isometric hold and pain point Carma Leaven with R SLS and L leg pressed into wall x 10 x 10 sec each Step downs R 2 x 10 and step downs on L 2 x 10  Neuromuscular re-ed: In corner,  R  SLS on There ex working on skill for 2 min On there ex pad, around the world with 10 lb weight Manual Therapy: MTPR along the gastroc/ soleus Active release techniques for the gastroc/ soleus Talocrural distraction mobs grad III KT tape for edema to R lateral superior ankle over bruise Modalities  Iontophoresis 1 cc Dexamethasone in  patch to be removed in 4-6 hours  handout given to Pt for precautions and use/discard instructions.  [facility]  Adult PT Treatment:                                                DATE: 05-26-22 Bil heel raise with UE support 1 x 15  knees bent Carma Leaven with R SLS and L leg pressed into wall Great toe flexion with GTB x 20 Single leg calf eccentrics (knee bent)   Do 2 sets of 15 reps Contract/ relax long sitting gastroc stretch 3 x 30 secon with 10 sec contraction 4 in step downs 2 x 15 with R UE support Back against wall and Bil DF x 30 Downward dog to push up x 5  Toe walking. Lunge on R x 10 with UE support . Manual Therapy: MTPR along the gastroc/ soleus Active release techniques for the gastroc/ soleus Talocrural distraction mobs grad III Modalities  Iontophoresis 1 cc Dexamethasone in patch to be removed in 4-6 hours  handout given to Pt for precautions and use/discard instructions.   [facility] Adult PT Treatment:                                                DATE: 05-24-22 herapeutic Exercise:  Bil heel raise with UE support 2 x 15  knees bent Standing heel raise with 45 sec x 5 isometric hold and pain point Single leg calf eccentrics (knee bent)   Do 2 sets of 15 reps Contract/ relax long sitting gastroc stretch 3 x 30 secon with 10 sec contraction 4 in step downs 2 x 15 with R UE support Back against wall and Bil DF x 30 Long sitting heel raise with focus on eccentric loading x 5 seconds 2 x 20    Manual Therapy: MTPR along the gastroc/ soleus Active release techniques for the gastroc/ soleus Talocrural distraction mobs grad III  [facility] Adult PT Treatment:                                                DATE: 05/19/2022 Therapeutic Exercise: Contract/ relax long sitting gastroc stretch 3 x 30 secon with 10 sec contraction Long sitting heel raise with focus on eccentric loading x 5 seconds 2 x 20   -reviewed and updated HEP today Manual Therapy: MTPR along the gastroc/ soleus Active release techniques for the gastroc/ soleus Talocrural distraction mobs grad III    [facility]  Adult PT Treatment:                                                DATE: 05-12-22 6MWT 1645ft (norm for age [phone]) to begin Therapeutic Exercise:  Heel raise 3 x 10 at max range  bearing wt through great toe Eccentric Plantar Fascia Strengthening on Step   3 x 10 reps Kickstand deadlift with 15lb on R  3 x 10 with rest between walking it out  45 second isometric R heel raise straight knee  x 3 with 90 sec rest between reps 45 second isometric R heel raise with bent knee  x 3 with 90 sec rest between rep Single leg calf eccentrics (knee bent)   Do 2 sets of 15 reps Standing Plantar Fascia Mobilization with Small Ball  - 1 x daily - 7 x weekly - 2 sets - 10 reps Single limb bridge with 10 lb 3 x 10 on R SLS on there ex pad  over 3 min SLS on R LE with vectors with L for 3 min Manual Therapy: STW ot R gastroc/achilles tendon concentrating on medial gastroc and medial achilles tendon  IASTYM   [facility] Adult PT Treatment:                                                DATE: 05-04-22 Therapeutic Exercise: Kickstand deadlift with 10 lb on R  3 x 10 with rest between 45 second isometric R heel raise straight knee  x 3 with 90 sec rest between reps 45 second isometric R heel raise with bent knee  x 3 with 90 sec rest between reps Eccentric Plantar Fascia Strengthening on Step   3 x 10 reps Single leg calf eccentrics (knee bent)   Do 2 sets of 15 reps Standing Plantar Fascia Mobilization with Small Ball  - 1 x daily - 7 x weekly - 2 sets - 10 reps  STW ot R gastroc/achilles tendon concentrating on medial gastroc and medial achilles tendon Trigger Point Dry Needling Treatment: Pre-treatment instruction: Patient instructed on dry needling rationale, procedures, and possible side effects including pain during treatment (achy,cramping feeling), bruising, drop of blood, lightheadedness, nausea, sweating. Patient Consent Given: Yes Education handout provided: Previously provided Muscles treated:  Left medial gastroc, achilles tendon  Needle size and number: .30x66mm x 4 Electrical stimulation performed: Yes Parameters:  25 pps to pt tolerance  alligator clips Treatment response/outcome: Palpable decrease in muscle tension Post-treatment instructions: Patient instructed to expect possible mild to moderate muscle soreness later today and/or tomorrow. Patient instructed in methods to reduce muscle soreness and to continue prescribed HEP. If patient was dry needled over the lung field, patient was instructed on signs and symptoms of pneumothorax and, however unlikely, to see immediate medical attention should they occur. Patient was also educated on signs and symptoms of infection and to seek medical attention should they occur. Patient verbalized understanding of these instructions and education.      PATIENT EDUCATION:  Education details: POC Explanation of findings, initial HEP Person educated: Patient Education method: Explanation, Demonstration, Tactile cues, Verbal cues, and Handouts Education comprehension: verbalized understanding, returned demonstration, verbal cues required, tactile cues required, and needs further education     HOME EXERCISE PROGRAM:  Access Code: [email or web address] URL: [email or web address] Date: 05/26/2022 Prepared by: Voncille Lo  Program Notes Single leg calf eccentrics (knee bent) Hold onto a door frame or counter, sit with hips back, bend your knees and lower into 1/2 squat, Maintain angle, perform a double leg calf raise. At top position, shift your weight onto one leg and do a single leg eccentric into starting position. then lift again with both legs and repeat - two, up, one down   Do 2 sets of 15 repsCaptain Morgan exercise   hold for 1 min rest and 2 more rounds  Exercises - Calf Mobilization with Foam Roll  - 1 x daily - 7 x weekly - 2 sets - 10 reps - Soleus Mobilization  - 1 x daily - 7 x weekly - 2 sets - 10 reps - Standing Plantar  Fascia Mobilization with Small Ball  - 1 x daily - 7 x weekly - 2 sets - 10 reps - Eccentric Plantar Fascia Strengthening on Step  - 1-2 x daily - 7 x weekly - 3 sets - 10 reps - Split Squat Forward Trunk with Dumbbells  - 1 x daily - 7 x weekly - 3 sets - 10 reps - Bridge with Hamstring Curl on Swiss Ball  - 1 x daily - 7 x weekly - 3 sets - 10 reps - Single Leg Isometric Heel Raise at [employer] with [employer]  - 1 x daily - 7 x weekly - 1 sets - 5 reps - 45 hold - Single Leg Bridge  - 1 x daily - 7 x weekly - 3 sets - 10 reps - Long Sitting Eccentric Ankle Plantar Flexion with Resistance  - 1 x daily - 7 x weekly - 3 sets - 15 reps  CLINICAL IMPRESSION:  Pt arrives to session reporting 1/10 pain, but she does have pain from a bruise on her R lower leg running into a step. Pt with isometric load up to 2-3/10.    She is making progress with PT increasing LEFS to 63/80, and FOTO 66%.on 05-19-22. Pt has completed all STG but still has LTG to complete.   Pt with SLS and fatigues easily with less than 15 sec hold.   MMT and AROM within normal limits. But functional SL strength with tremors and fatigue easily. Would like to increase pt resilience and functional strength for remaining visits. Ms Scrogham is improving in descending down steps but still with discomfort.  Pt will benefit from extending PT for an additional 6 weeks to complete goals and to increase functional strength.  Ms Dimiceli presents to clinic with 2/10 pain but did complain of increased pain yesterday being on feet to prepare for a birthday.with multiple trips up and down steps up to 6/10 pain. Tried some different movements and strengthening of medial/transverse arch and great toe ext/flex  Continued with exercise followed by manual.  gentle LAD for Talocrural being mindful of pt hx of osteoporosis. Continued focus of eccentric loading standing which she noted relief of tension and pain and especially with 45 sec isometric hold.  Due to pt R lateral  ankle irritation, used iontophoresis to try decreased of inflammation.  Medial arch with no pain today. End of session she noted feeling better than when she came in 1/10      OBJECTIVE IMPAIRMENTS decreased activity tolerance, decreased knowledge of condition, decreased mobility, difficulty walking, decreased ROM, decreased strength, and pain.    ACTIVITY LIMITATIONS standing, squatting, stairs, and locomotion level   PARTICIPATION LIMITATIONS: cleaning, laundry, shopping, community activity, and walking for exercise   PERSONAL FACTORS  L  TKA 10-18-21, osteoporosis are also affecting patient's functional outcome.    REHAB POTENTIAL: Excellent   CLINICAL DECISION MAKING: Evolving/moderate complexity   EVALUATION COMPLEXITY: Moderate     GOALS: Goals reviewed with patient? Yes   SHORT TERM GOALS: Target date: 05/12/2022  Patient will be independent and compliant with initial HEP Baseline:no knowledged for achilles tendonitis HEP Goal status:MET   2.  Patient will demonstrate ability to perform 6 MWT in age appropriate normal range Baseline:  TBD at 2nd visit  05-12-22 6MWT 1654ft (norm for age [phone] ft) to begin Goal status: MET   3.  Patient will maintain SLS for at least 30 seconds on the RLE to improve stability when navigating on uneven terrain.  Baseline: 17 sec R 05-12-22  38 sec  Goal status: MET         LONG TERM GOALS: Target date: 06/02/2022  revised 07-12-22   Pt will be independent with advanced HEP Baseline: limited knowledge 05-31-22 using advanced program now Goal status: ONGOING   2.  Patient will report minimal difficulty with stair negotiation including no dependence with UE and alternating step descend Baseline: must use UE and one step at a time to go down step,  05-12-22  2/10 going down step. No pain going up step 05-31-22 pt improved but descending 2-3/10 Goal status: ONGOING   3.  Patient will score at least 60/80 on the LEFS to signify clinically  meaningful improvement in functional abilities Baseline: 46/80 Goal status: MET 05/19/2022   4.  Pt will be able to walk 1 mile with minimal discomfort to increase walking for exercise tolerance Baseline:  Goal status: MET 05/19/2022   5.  FOTO will improve from 47%   to  68%   indicating improved functional mobility Baseline: eval 47% 05/19/2022 66% Goal status: ONGOING 05/19/2022         PLAN: PT FREQUENCY: 1x/week   PT DURATION: 6 weeks   PLANNED INTERVENTIONS: Therapeutic exercises, Therapeutic activity, Neuromuscular re-education, Balance training, Gait training, Patient/Family education, Self Care, Joint mobilization, Stair training, Dry Needling, Electrical stimulation, Spinal mobilization, Cryotherapy, Moist heat, Taping, Ionotophoresis 4mg /ml Dexamethasone, Manual therapy, and Re-evaluation   PLAN FOR NEXT SESSION:  KX modifiey Eccentric loading,   check iontophoresis  reeval next visit   Voncille Lo, PT, [place] Certified Exercise Expert for the Aging Adult  05/31/22 12:11 PM Phone: [phone] Fax: [phone]

## 2022-05-31 NOTE — Addendum Note (Signed)
Addended by: Geradine Girt D on: 05/31/2022 12:59 PM   Modules accepted: Orders

## 2022-06-04 IMAGING — MR MR THORACIC SPINE W/O CM
4 of 6 series · 17 of 48 positions shown · non-contrast
Comparison: Chest radiograph 11/04/2016

CLINICAL DATA: Thoracic radiculopathy. Additional history provided
by scanning technologist: Patient reports right-sided mid back pain
that radiates into anterior abdomen for 1 year.

EXAM:
MRI THORACIC SPINE WITHOUT CONTRAST
TECHNIQUE: Multiplanar, multisequence MR imaging of the thoracic spine was
performed. No intravenous contrast was administered.

[Series 4: T2 · sagittal · 4.0mm · 0.47mm/px · 5 of 17 slices shown (1 of 3)]
[im 1/17]
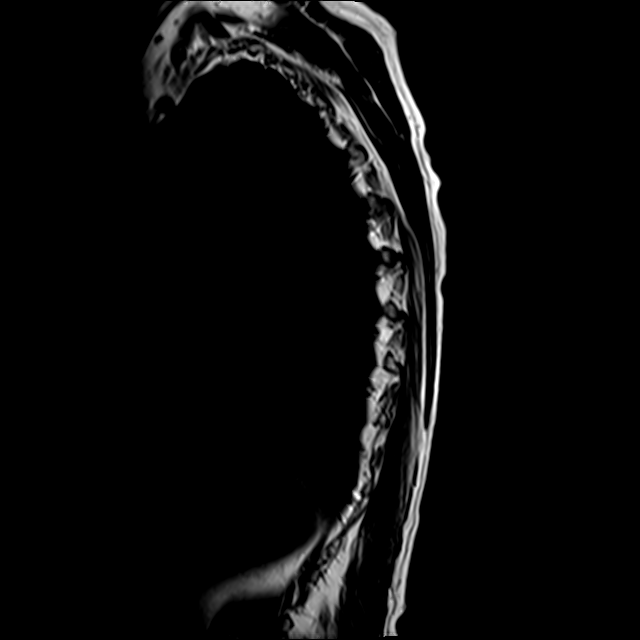
[im 5/17]
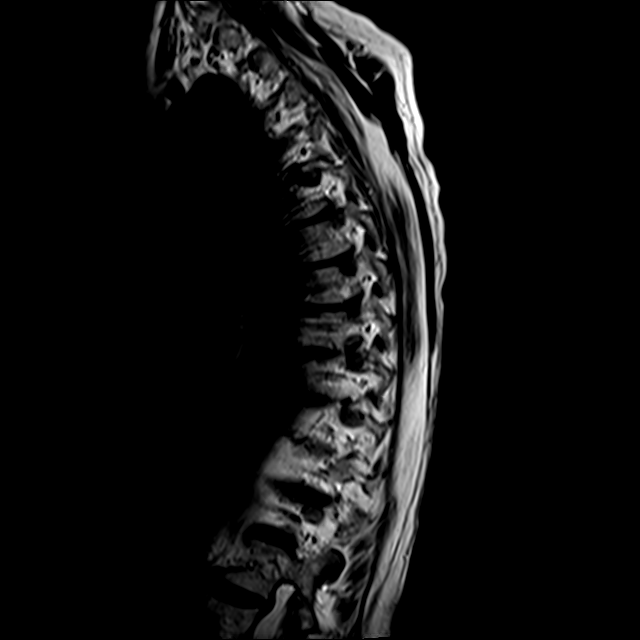
[im 9/17]
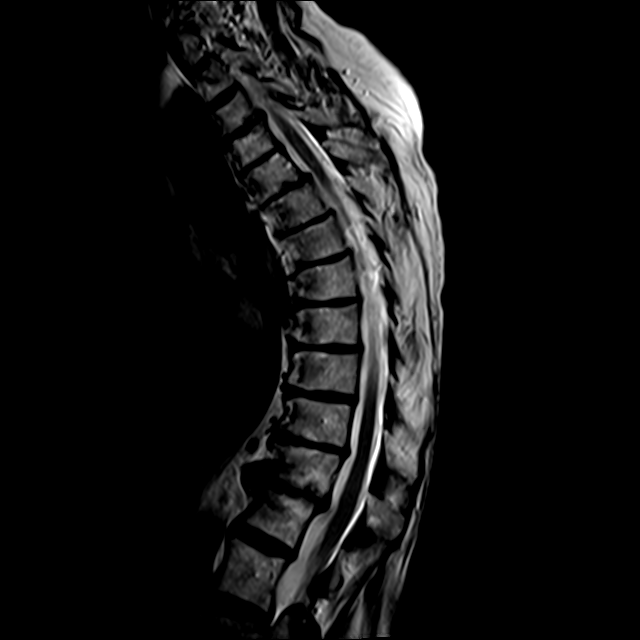
[im 13/17]
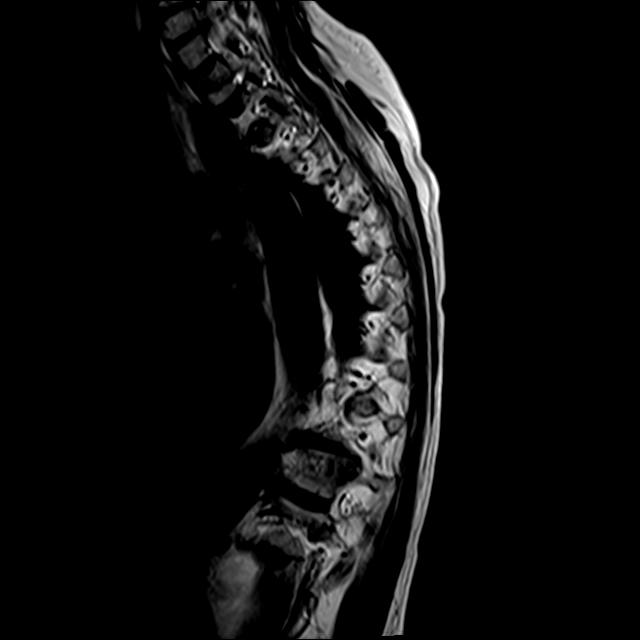
[im 17/17]
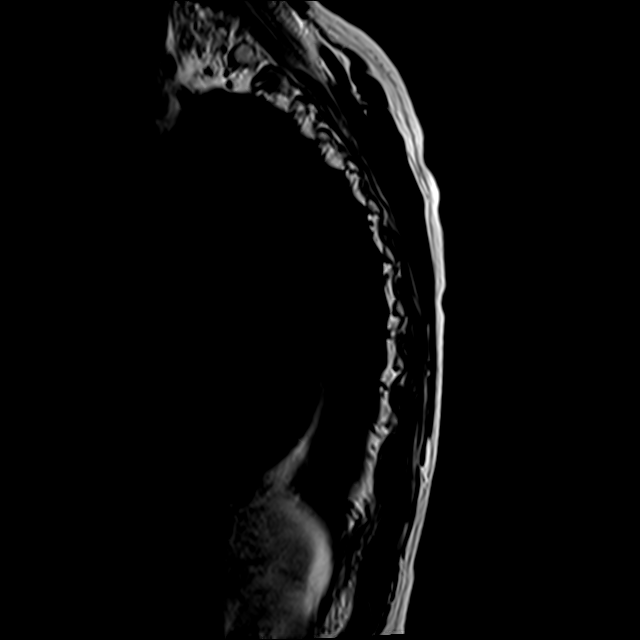

[Series 6: T1 · sagittal · 4.0mm · 0.94mm/px · 3 of 17 slices shown]
[im 4/17]
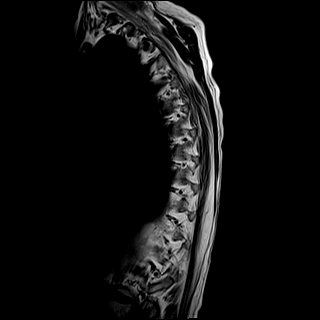
[im 10/17]
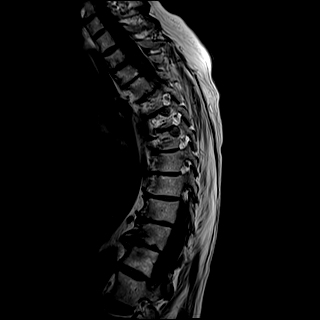
[im 17/17]
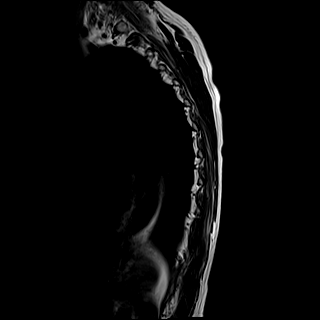

[Series 7: T2 · axial · 4.0mm · 0.39mm/px · z∈[-207,-84]mm · 6 of 40 slices shown (2 of 3)]
[im 1/40]
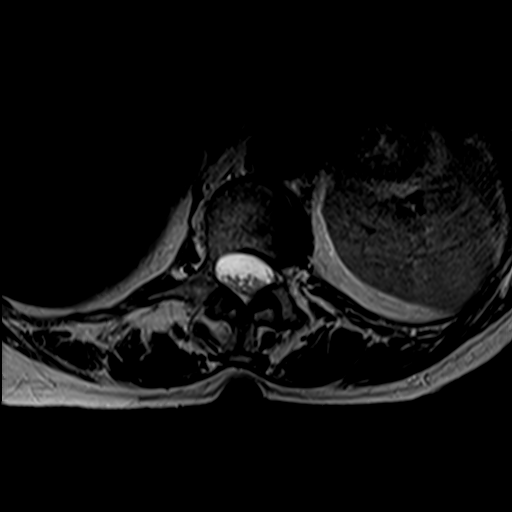
[im 7/40]
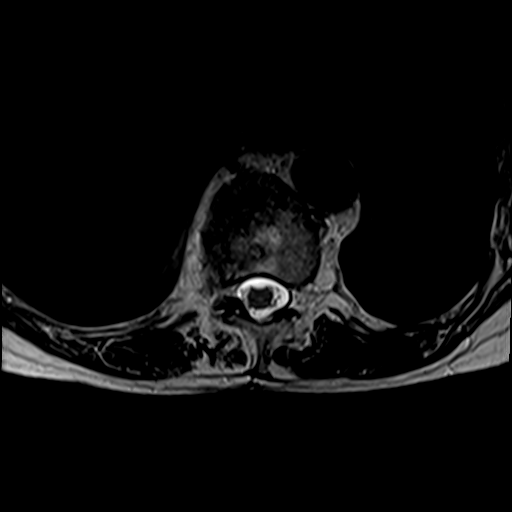
[im 14/40]
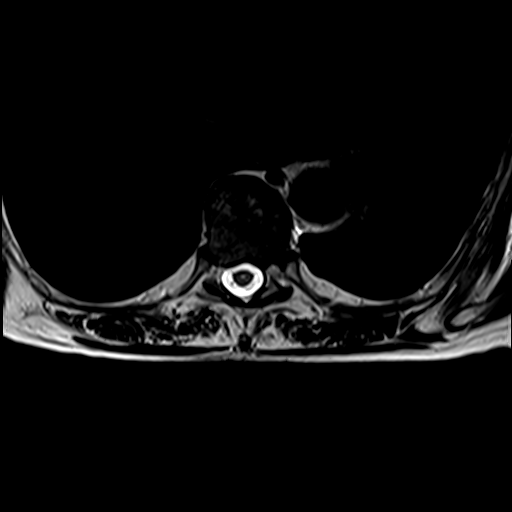
[im 17/40]
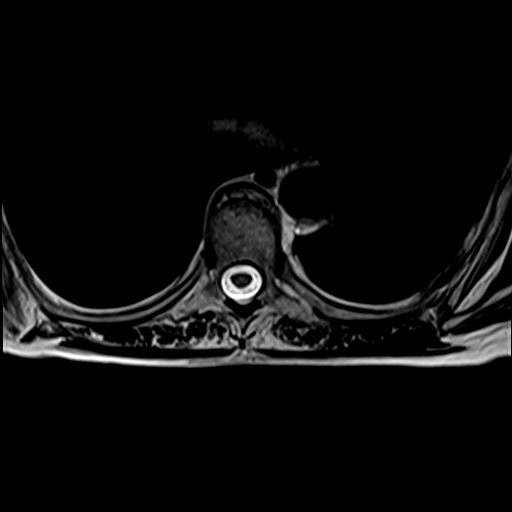
[im 20/40]
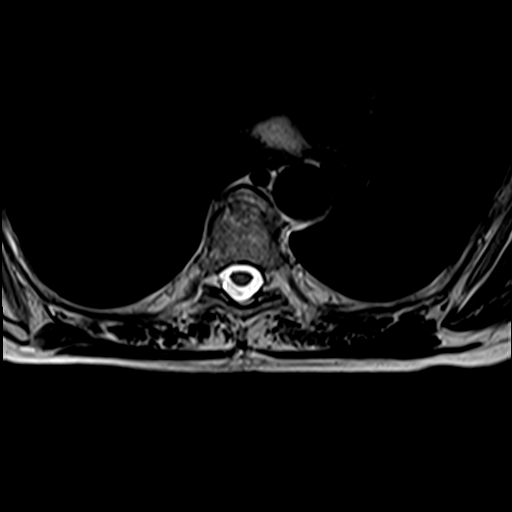
[im 33/40]
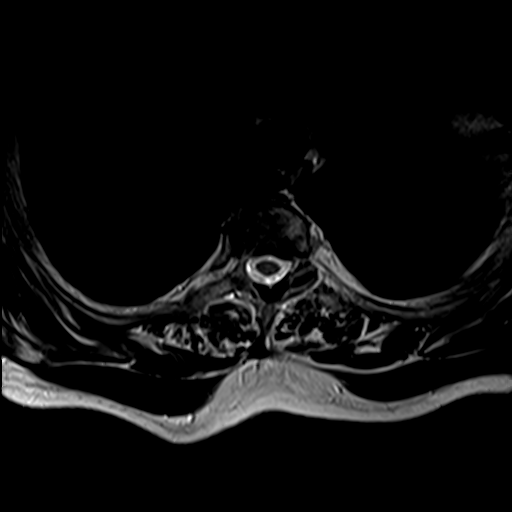

[Series 8: T2 · axial · 4.0mm · 0.39mm/px · z∈[-174,-84]mm · 3 of 40 slices shown (3 of 3)]
[im 7/40]
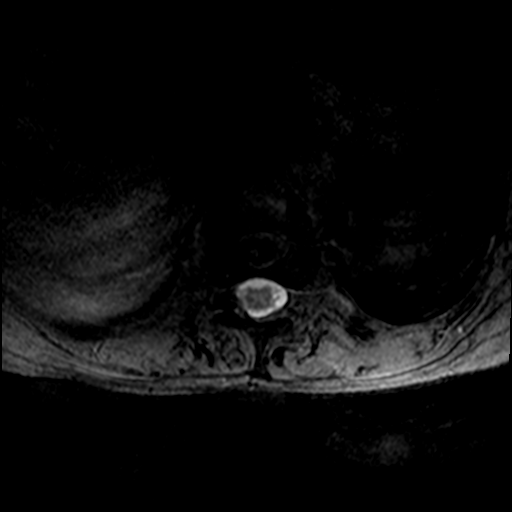
[im 20/40]
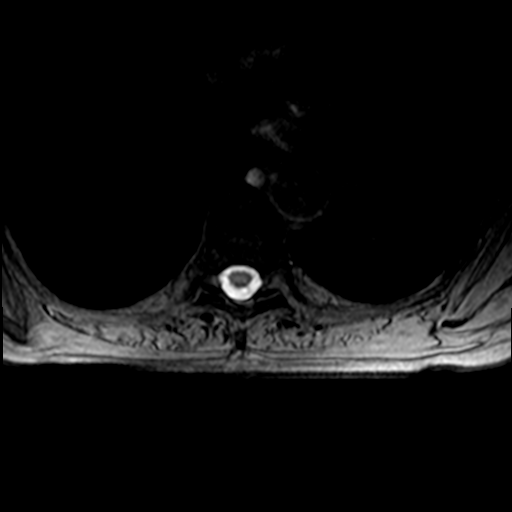
[im 33/40]
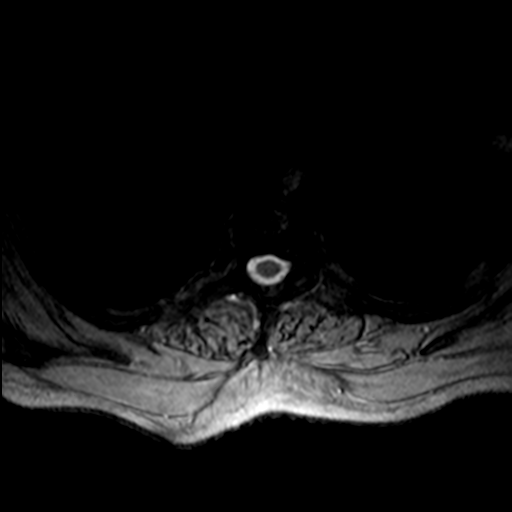

[17 of 48 positions shown; findings below may reference images not displayed]

FINDINGS: Cervical spondylosis is incompletely assessed on localizer imaging.

Alignment: There is a somewhat exaggerated thoracic kyphosis.
Thoracic dextrocurvature. 2 mm T4-T5 grade 1 anterolisthesis. 3 mm
T11-T12 grade 1 retrolisthesis.

Vertebrae: Vertebral body height is maintained. Multilevel
degenerative endplate irregularity, greatest at T10-T11 and T11-T12.
Moderate degenerative endplate edema at T2-T3, T10-T11 and T11-T12.
Mild degenerative endplate edema is also present anteriorly at
T7-T8.

Cord: No spinal cord signal abnormality is identified.

Paraspinal and other soft tissues: No abnormality is identified
within included portions of the thorax or upper abdomen.

Disc levels:

There is moderate to advanced disc degeneration throughout the
majority of the thoracic spine. Disc degeneration is severe at
T2-T3, T3-T4, T4-T5, T10-T11 and T11-T12.

There are multilevel disc bulges. There is mild multilevel facet
hypertrophy, greatest within the lower thoracic spine. Additional
notable level by level findings as below.

At T2-T3, there is a shallow left center disc protrusion
superimposed upon disc bulge with endplate spurring. There is mild
effacement of the ventral thecal sac with mild relative spinal canal
narrowing.

At T3-T4, there is a small right center/foraminal disc protrusion
with associated osteophyte ridge. This minimally effaces the right
ventral thecal sac. No significant central canal stenosis.

At T4-T5, there is a tiny right center disc protrusion with only
mild relative spinal canal narrowing.

At T5-T6, there is a tiny left center disc protrusion with only mild
relative spinal canal narrowing.

There is no more than mild relative spinal canal narrowing at any
level. There is no mass effect upon the spinal at any thoracic
level.

At T11-T12, disc bulge and osteophyte ridge as well as facet
hypertrophy contribute to mild/moderate right neural foraminal
narrowing. There is no more than mild foraminal narrowing at the
remaining thoracic levels.
IMPRESSION: Thoracic spondylosis as outlined. Disc bulges/disc herniations and
endplate spurring mildly efface the ventral thecal sac at multiple
levels. However, there is no more than mild relative spinal canal
narrowing within the thoracic spine. There is no mass effect upon
the thoracic spinal cord.

At T11-T12, there is multifactorial mild/moderate right neural
foraminal narrowing. No more than mild foraminal narrowing at the
remaining thoracic levels.

Of note, disc degeneration is severe at T2-T3, T3-T4, T4-T5, T10-T11
and T11-T12. There is associated moderate degenerative endplate
edema at T2-T3, T10-T11 and T11-T12.

Thoracic dextrocurvature with somewhat exaggerated thoracic
kyphosis.

3 mm T11-T12 grade 1 retrolisthesis.

2 mm T4-T5 grade 1 anterolisthesis.

## 2022-06-06 NOTE — Progress Notes (Unsigned)
Follow Up Note  RE: Mary Garcia MRN: [id number] DOB: Oct 31, 1945 Date of Office Visit: 06/08/2022  Referring provider: Elby Showers, MD Primary care provider: Elby Showers, MD  Chief Complaint: No chief complaint on file.  History of Present Illness: I had the pleasure of seeing Mary Garcia for a follow up visit at the [facility] [place] [facility] on 06/06/2022. She is a 76 y.o. female, who is being followed for asthma and allergic rhinitis. Her previous allergy office visit was on 12/08/2021 with Dr. Maudie Mercury. Today is a regular follow up visit.  Mild persistent asthma without complication Past history - Issues with coughing, wheezing and SOB for the past 3-4 years with worsening during URIs. Used to be on Flovent, Qvar in the past. Tried PPIs in the past with no benefit. 2020 skin testing showed: Borderline positive to maple tree pollen. 2020 spirometry showed: mild possible restrictive disease with 14% improvement in FEV post bronchodilator treatment.  Interim history - only flare she had was in January requiring oral prednisone.  Today's spirometry showed some mild obstruction.  Daily controller medication(s): Symbicort 22mg 2 puffs once a day with spacer and rinse mouth afterwards. May need to use twice a day during the fall/winter season.  May use albuterol rescue inhaler 2 puffs or nebulizer every 4 to 6 hours as needed for shortness of breath, chest tightness, coughing, and wheezing. May use albuterol rescue inhaler 2 puffs 5 to 15 minutes prior to strenuous physical activities. Monitor frequency of use.  During upper respiratory infections/asthma flares: Start Symbicort 842m 2 puffs twice a day for 1-2 weeks. Patient [facility]lick [facility].  If you run out of the proair respiclick let me know and we can try the proair digihaler next.  Get spirometry at next visit.   Seasonal allergic rhinitis due to pollen Past history - Perennial rhinitis symptoms for the past  8 years. 2020 skin testing was borderline positive to tree pollen.  Interim history - asymptomatic with no medications. Continue environmental control measures.  May use azelastine nasal spray 1-2 sprays per nostril 1-2 times a day as needed for runny nose. May use over the counter antihistamines such as Zyrtec (cetirizine), Claritin (loratadine), Allegra (fexofenadine), or Xyzal (levocetirizine) daily as needed. May take twice a day during flares.    Return in about 6 months (around 06/09/2022).    Assessment and Plan: SuEdlins a [age].o. female with: No problem-specific Assessment & Plan notes found for this encounter.  No follow-ups on file.  No orders of the defined types were placed in this encounter.  Lab Orders  No laboratory test(s) ordered today    Diagnostics: Spirometry:  Tracings reviewed. Her effort: {Blank single:19197::"Good reproducible efforts.","It was hard to get consistent efforts and there is a question as to whether this reflects a maximal maneuver.","Poor effort, data can not be interpreted."} FVC: ***L FEV1: ***L, ***% predicted FEV1/FVC ratio: ***% Interpretation: {Blank single:19197::"Spirometry consistent with mild obstructive disease","Spirometry consistent with moderate obstructive disease","Spirometry consistent with severe obstructive disease","Spirometry consistent with possible restrictive disease","Spirometry consistent with mixed obstructive and restrictive disease","Spirometry uninterpretable due to technique","Spirometry consistent with normal pattern","No overt abnormalities noted given today's efforts"}.  Please see scanned spirometry results for details.  Skin Testing: {Blank single:19197::"Select foods","Environmental allergy panel","Environmental allergy panel and select foods","Food allergy panel","None","Deferred due to recent antihistamines use"}. *** Results discussed with patient/family.   Medication List:  Current Outpatient Medications   Medication Sig Dispense Refill  . Albuterol  Sulfate (PROAIR RESPICLICK) 809 (90 Base) MCG/ACT AEPB Inhale 2 puffs into the lungs every 4 (four) hours as needed (coughing, wheezing, shorntess of breath). 1 each 1  . ALPRAZolam (XANAX) 0.5 MG tablet One half to one tab twice daily as needed for anxiety. 60 tablet 0  . azelastine (ASTELIN) 0.1 % nasal spray Place 1-2 sprays in each nostril one to two times a day as needed for runny nose/drainage down throat 30 mL 5  . budesonide-formoterol (SYMBICORT) 80-4.5 MCG/ACT inhaler Inhale 2 puffs into the lungs 2 (two) times daily. with spacer and rinse mouth afterwards. 11 g 5  . Cholecalciferol (VITAMIN D) 50 MCG (2000 UT) tablet Take 2,000 Units by mouth 2 (two) times daily.    Marland Kitchen ELDERBERRY PO Take 575 mg by mouth 2 (two) times daily.    Marland Kitchen ibandronate (BONIVA) 150 MG tablet Take 1 tablet (150 mg total) by mouth every 30 (thirty) days. Take in the morning with a full glass of water, on an empty stomach, and do not take anything else by mouth or lie down for the next 30 min. 3 tablet 3  . ketoconazole (NIZORAL) 2 % cream Apply 1 application topically 2 (two) times daily as needed (rash).    . meloxicam (MOBIC) 7.5 MG tablet Take 7.5 mg by mouth daily as needed.    . mirabegron ER (MYRBETRIQ) 25 MG TB24 tablet Take 1 tablet (25 mg total) by mouth daily. 30 tablet 1  . Multiple Minerals-Vitamins (CITRACAL MAXIMUM PLUS) TABS Take 1 tablet by mouth daily.    Marland Kitchen tretinoin (RETIN-A) 0.1 % cream Apply 1 application topically at bedtime.    Marland Kitchen zolpidem (AMBIEN) 10 MG tablet TAKE 1 TABLET BY MOUTH EVERY DAY AT BEDTIME AS NEEDED FOR SLEEP 90 tablet 1   No current facility-administered medications for this visit.   Allergies: No Known Allergies I reviewed her past medical history, social history, family history, and environmental history and no significant changes have been reported from her previous visit.  Review of Systems  Constitutional:  Negative for appetite  change, chills, fever and unexpected weight change.  HENT:  Negative for congestion, postnasal drip and rhinorrhea.   Eyes:  Negative for itching.  Respiratory:  Negative for cough, chest tightness, shortness of breath and wheezing.   Cardiovascular:  Negative for chest pain.  Gastrointestinal:  Negative for abdominal pain.  Genitourinary:  Negative for difficulty urinating.  Skin:  Negative for rash.  Allergic/Immunologic: Positive for environmental allergies. Negative for food allergies.  Neurological:  Negative for headaches.   Objective: There were no vitals taken for this visit. There is no height or weight on file to calculate BMI. Physical Exam Vitals and nursing note reviewed.  Constitutional:      Appearance: Normal appearance. She is well-developed.  HENT:     Head: Normocephalic and atraumatic.     Right Ear: Tympanic membrane and external ear normal.     Left Ear: Tympanic membrane and external ear normal.     Nose: Nose normal.     Mouth/Throat:     Mouth: Mucous membranes are moist.     Pharynx: Oropharynx is clear.  Eyes:     Conjunctiva/sclera: Conjunctivae normal.  Cardiovascular:     Rate and Rhythm: Normal rate and regular rhythm.     Heart sounds: Normal heart sounds. No murmur heard.    No friction rub. No gallop.  Pulmonary:     Effort: Pulmonary effort is normal.     Breath  sounds: Normal breath sounds. No wheezing or rales.  Musculoskeletal:     Cervical back: Neck supple.  Skin:    General: Skin is warm.     Findings: No rash.  Neurological:     Mental Status: She is alert and oriented to person, place, and time.  Psychiatric:        Mood and Affect: Mood normal.        Behavior: Behavior normal.  Previous notes and tests were reviewed. The plan was reviewed with the patient/family, and all questions/concerned were addressed.  It was my pleasure to see Diasia today and participate in her care. Please feel free to contact me with any questions or  concerns.  Sincerely,  Rexene Alberts, DO Allergy & Immunology  [facility] office: [place] office: [phone]

## 2022-06-07 ENCOUNTER — Ambulatory Visit: Payer: Medicare Other | Admitting: Physical Therapy

## 2022-06-07 DIAGNOSIS — M25562 Pain in left knee: Secondary | ICD-10-CM | POA: Diagnosis not present

## 2022-06-07 DIAGNOSIS — R262 Difficulty in walking, not elsewhere classified: Secondary | ICD-10-CM

## 2022-06-07 DIAGNOSIS — M25571 Pain in right ankle and joints of right foot: Secondary | ICD-10-CM | POA: Diagnosis not present

## 2022-06-07 DIAGNOSIS — R600 Localized edema: Secondary | ICD-10-CM | POA: Diagnosis not present

## 2022-06-07 DIAGNOSIS — M6281 Muscle weakness (generalized): Secondary | ICD-10-CM

## 2022-06-07 DIAGNOSIS — G8929 Other chronic pain: Secondary | ICD-10-CM

## 2022-06-07 NOTE — Therapy (Signed)
OUTPATIENT PHYSICAL THERAPY TREATMENT NOTE/PROGRESS NOTE  Progress Note Reporting Period 04-21-22 to 06-07-22  See note below for Objective Data and Assessment of Progress/Goals.       Patient Name: Mary Garcia MRN: [id number] DOB:June 09, 1946, 76 y.o., female 18 Date: 06/07/2022  PCP: Elby Showers, MD REFERRING PROVIDER: Armond Hang MD  END OF SESSION:   PT End of Session - 06/07/22 0940     Visit Number 10    Number of Visits 15    Date for PT Re-Evaluation 07/12/22    Authorization Type BC[employer]    PT Start Time 0932    PT Stop Time 1016    PT Time Calculation (min) 44 min    Activity Tolerance Patient tolerated treatment well    Behavior During Therapy WFL for tasks assessed/performed                     Past Medical History:  Diagnosis Date   Anxiety    Arthritis    Asthma    Bronchitis, acute    Past Surgical History:  Procedure Laterality Date   BREAST ENHANCEMENT SURGERY     CATARACT EXTRACTION W/ INTRAOCULAR LENS IMPLANT Bilateral    FACELIFT     local   ROTATOR CUFF REPAIR Right    TIBIA FRACTURE SURGERY  2004   left leg x3, tibia and fibula   TOTAL KNEE ARTHROPLASTY Left 10/18/2021   Procedure: TOTAL KNEE ARTHROPLASTY;  Surgeon: Vickey Huger, MD;  Location: [facility];  Service: Orthopedics;  Laterality: Left;   TUMOR EXCISION  2003   right muscle back, benign   Patient Active Problem List   Diagnosis Date Noted   Seasonal allergic rhinitis due to pollen 05/17/2021   Mild persistent asthma without complication 93/71/6967   Other allergic rhinitis 03/27/2019   DDD (degenerative disc disease), cervical 09/15/2017   Depression 02/02/2016   Arthropathy of right shoulder 02/02/2016   Insomnia 04/13/2011   Osteoporosis 04/13/2011   Biliary dyskinesia 04/13/2011   Osteoarthritis 04/13/2011   History of migraine headaches 04/13/2011   Vitamin D deficiency 04/13/2011   Anxiety 04/13/2011   Irritable bowel syndrome 04/13/2011     REFERRING DIAG: M76.61 (ICD-10-CM) - Achilles tendinitis, right leg  THERAPY DIAG:  Pain in right ankle and joints of right foot  Muscle weakness (generalized)  Difficulty in walking, not elsewhere classified  Chronic pain of left knee  Localized edema  Rationale for Evaluation and Treatment Rehabilitation  PERTINENT HISTORY: RTC R, Tibia fracture  2004/ rod removal, L  TKA 10-18-21, osteoporosis  PRECAUTIONS: none  SUBJECTIVE:  my Right foot is 4/10 today.  I have  been having more discomfort in my R ankle on the inside and on the achilles tendon.  I sometimes feel very swollen in that area.  My out side is better but when I press on it is some sore.  I walked a mile on Saturday and Sunday.  I put a lift in my R shoe.  I  am not able to walk 4-5 miles on the beach with my girlfriends like I was able to do.  So it keeps me from doing my normal recreation and social   PAIN:  Are you having pain? Yes: NPRS scale: 10-17 -23  1/10 Pain location: R insertional achilles tendonitis Pain description: sharp, piercing throbbing Aggravating factors: standing for longer than 2 hours,  I don't walk more than 1/4 m Relieving factors: Meloxicam and Voltaren Can't walk more than 1/4  mile, limiting my usual 5 miles of walking, descending on steps worse than ascending.   OBJECTIVE: (objective measures completed at initial evaluation unless otherwise dated)  OBJECTIVE:    DIAGNOSTIC FINDINGS: none recent noted in Epic for R ankle   PATIENT SURVEYS:  LEFS 46/80  57.5% 05/19/2022 63/80 05-31-22   FOTO 47% predicted 68 05/19/2022 66% 6MWT 1623ft (norm for age 29-1845 ft)   COGNITION:           Overall cognitive status: Within functional limits for tasks assessed                          SENSATION: WFL       MUSCLE LENGTH: Hamstrings: WNL     POSTURE: rounded shoulders, forward head, and anterior pelvic tilt WNL   PALPATION: TTP over insertional achilles R tendon   LOWER  EXTREMITY ROM:   Active ROM Right eval Left eval RIGHT 05-26-22  Hip flexion       Hip extension       Hip abduction       Hip adduction       Hip internal rotation       Hip external rotation       Knee flexion       Knee extension       Ankle dorsiflexion 12 slight pain in end range 10 old fibular fx with scress/plates 12  Ankle plantarflexion 40 52 45  Ankle inversion $RemoveBeforeD'23 30 25  '[id number]$ Ankle eversion 35 21 36   (Blank rows = not tested)   LOWER EXTREMITY MMT:   MMT Right eval Left eval  Hip flexion 5 5  Hip extension   5  Hip abduction 4 5  Hip adduction      Hip internal rotation 4+ 5  Hip external rotation 4+ 5  Knee flexion      Knee extension      Ankle dorsiflexion 4+ 5  Ankle plantarflexion 4 5  Ankle inversion 4+ 5  Ankle eversion 4+ 5   (Blank rows = not tested)       FUNCTIONAL TESTS:  5 times sit to stand: 8.01 sec   GAIT: Distance walked: 150 feet Assistive device utilized: None Level of assistance: Complete Independence Comments: slight antalgic gait       TODAY'S TREATMENT: [facility] Adult PT Treatment:                                                DATE: 06-07-22 Therapeutic Exercise: Supine PF against wall 45 sec x 5 with plantar flexion stretching between isometric holds [employer] pew sway in corner for 1 min BIL heel raise 2 x 10 Carma Leaven with R SLS and L leg pressed into wall x 6x 10 sec each March in place with 10 lb  for hip flexor 3 x 10 Manual Therapy: MTPR along the gastroc/ soleus Active release techniques for the gastroc/ soleus Talocrural distraction mobs grad III Use of IASTYM over medial achilles into gastroc Modalities  Iontophoresis 1 cc Dexamethasone in patch to medial heel /achilles to be removed in 4-6 hours  handout given to Pt for precautions and use/discard instructions.   [facility] Adult PT Treatment:  DATE: 05-31-22 Therapeutic Exercise: Heel raises BIL with 10 lb in Left  hand, and 15 # in R hand 3 x 10 On 6 inch step  SL heel raise with 10 # in R hand 3 x 10 March in place with 10 lb  for hip flexor 3 x 10 Standing heel raise with 45 sec x 5 isometric hold and pain point Carma Leaven with R SLS and L leg pressed into wall x 10 x 10 sec each Step downs R 2 x 10 and step downs on L 2 x 10  Neuromuscular re-ed: In corner,  R  SLS on There ex working on skill for 2 min On there ex pad, around the world with 10 lb weight Manual Therapy: MTPR along the gastroc/ soleus Active release techniques for the gastroc/ soleus Talocrural distraction mobs grad III KT tape for edema to R lateral superior ankle over bruise Modalities  Iontophoresis 1 cc Dexamethasone in patch to be removed in 4-6 hours  handout given to Pt for precautions and use/discard instructions.  [facility] Adult PT Treatment:                                                DATE: 05-26-22 Bil heel raise with UE support 1 x 15  knees bent Carma Leaven with R SLS and L leg pressed into wall Great toe flexion with GTB x 20 Single leg calf eccentrics (knee bent)   Do 2 sets of 15 reps Contract/ relax long sitting gastroc stretch 3 x 30 secon with 10 sec contraction 4 in step downs 2 x 15 with R UE support Back against wall and Bil DF x 30 Downward dog to push up x 5  Toe walking. Lunge on R x 10 with UE support . Manual Therapy: MTPR along the gastroc/ soleus Active release techniques for the gastroc/ soleus Talocrural distraction mobs grad III Modalities  Iontophoresis 1 cc Dexamethasone in patch to be removed in 4-6 hours  handout given to Pt for precautions and use/discard instructions.   [facility] Adult PT Treatment:                                                DATE: 05-24-22 herapeutic Exercise:  Bil heel raise with UE support 2 x 15  knees bent Standing heel raise with 45 sec x 5 isometric hold and pain point Single leg calf eccentrics (knee bent)   Do 2 sets of 15 reps Contract/ relax long  sitting gastroc stretch 3 x 30 secon with 10 sec contraction 4 in step downs 2 x 15 with R UE support Back against wall and Bil DF x 30 Long sitting heel raise with focus on eccentric loading x 5 seconds 2 x 20    Manual Therapy: MTPR along the gastroc/ soleus Active release techniques for the gastroc/ soleus Talocrural distraction mobs grad III  [facility] Adult PT Treatment:                                                DATE: 05/19/2022 Therapeutic Exercise: Contract/ relax long  sitting gastroc stretch 3 x 30 secon with 10 sec contraction Long sitting heel raise with focus on eccentric loading x 5 seconds 2 x 20   -reviewed and updated HEP today Manual Therapy: MTPR along the gastroc/ soleus Active release techniques for the gastroc/ soleus Talocrural distraction mobs grad III    [facility] Adult PT Treatment:                                                DATE: 05-12-22 6MWT 1690ft (norm for age [phone] ft) to begin Therapeutic Exercise:  Heel raise 3 x 10 at max range  bearing wt through great toe Eccentric Plantar Fascia Strengthening on Step   3 x 10 reps Kickstand deadlift with 15lb on R  3 x 10 with rest between walking it out 45 second isometric R heel raise straight knee  x 3 with 90 sec rest between reps 45 second isometric R heel raise with bent knee  x 3 with 90 sec rest between rep Single leg calf eccentrics (knee bent)   Do 2 sets of 15 reps Standing Plantar Fascia Mobilization with Small Ball  - 1 x daily - 7 x weekly - 2 sets - 10 reps Single limb bridge with 10 lb 3 x 10 on R SLS on there ex pad  over 3 min SLS on R LE with vectors with L for 3 min Manual Therapy: STW ot R gastroc/achilles tendon concentrating on medial gastroc and medial achilles tendon  IASTYM   [facility] Adult PT Treatment:                                                DATE: 05-04-22 Therapeutic Exercise: Kickstand deadlift with 10 lb on R  3 x 10 with rest between 45 second isometric R heel raise  straight knee  x 3 with 90 sec rest between reps 45 second isometric R heel raise with bent knee  x 3 with 90 sec rest between reps Eccentric Plantar Fascia Strengthening on Step   3 x 10 reps Single leg calf eccentrics (knee bent)   Do 2 sets of 15 reps Standing Plantar Fascia Mobilization with Small Ball  - 1 x daily - 7 x weekly - 2 sets - 10 reps  STW ot R gastroc/achilles tendon concentrating on medial gastroc and medial achilles tendon Trigger Point Dry Needling Treatment: Pre-treatment instruction: Patient instructed on dry needling rationale, procedures, and possible side effects including pain during treatment (achy,cramping feeling), bruising, drop of blood, lightheadedness, nausea, sweating. Patient Consent Given: Yes Education handout provided: Previously provided Muscles treated: Left medial gastroc, achilles tendon  Needle size and number: .30x29mm x 4 Electrical stimulation performed: Yes Parameters:  25 pps to pt tolerance  alligator clips Treatment response/outcome: Palpable decrease in muscle tension Post-treatment instructions: Patient instructed to expect possible mild to moderate muscle soreness later today and/or tomorrow. Patient instructed in methods to reduce muscle soreness and to continue prescribed HEP. If patient was dry needled over the lung field, patient was instructed on signs and symptoms of pneumothorax and, however unlikely, to see immediate medical attention should they occur. Patient was also educated on signs and symptoms of infection and to seek medical attention should  they occur. Patient verbalized understanding of these instructions and education.      PATIENT EDUCATION:  Education details: POC Explanation of findings, initial HEP Person educated: Patient Education method: Explanation, Demonstration, Tactile cues, Verbal cues, and Handouts Education comprehension: verbalized understanding, returned demonstration, verbal cues required, tactile cues  required, and needs further education     HOME EXERCISE PROGRAM:  Access Code: [email or web address] URL: [email or web address] Date: 06/07/2022 Prepared by: Voncille Lo  Program Notes Single leg calf eccentrics (knee bent) Hold onto a door frame or counter, sit with hips back, bend your knees and lower into 1/2 squat, Maintain angle, perform a double leg calf raise. At top position, shift your weight onto one leg and do a single leg eccentric into starting position. then lift again with both legs and repeat - two, up, one down   Do 2 sets of 15 repsCaptain Morgan exercise   hold for 1 min rest and 2 more rounds  Exercises - Calf Mobilization with Foam Roll  - 1 x daily - 7 x weekly - 2 sets - 10 reps - Soleus Mobilization  - 1 x daily - 7 x weekly - 2 sets - 10 reps - Eccentric Plantar Fascia Strengthening on Step  - 1-2 x daily - 7 x weekly - 3 sets - 10 reps - Split Squat Forward Trunk with Dumbbells  - 1 x daily - 7 x weekly - 3 sets - 10 reps - Bridge with Hamstring Curl on Swiss Ball  - 1 x daily - 7 x weekly - 3 sets - 10 reps - Single Leg Isometric Heel Raise at [employer] with [employer]  - 1 x daily - 7 x weekly - 1 sets - 5 reps - 45 hold - Single Leg Bridge  - 1 x daily - 7 x weekly - 3 sets - 10 reps - Long Sitting Eccentric Ankle Plantar Flexion with Resistance  - 1 x daily - 7 x weekly - 3 sets - 15 reps - Seated heel raise with 20 lb weight  - 1 x daily - 7 x weekly - 3 sets - 10 reps - Glut med wall press /captain morgan  - 1 x daily - 7 x weekly - 3 sets - 10 reps  CLINICAL IMPRESSION:  Pt arrives to session reporting 4/10  Pt has been discouraged over continuing pain over R achilles tendon  Ms Abreu has made appt with MD to check on continuing pain and hoping for ultrasound diagnostic.  She is making progress with PT increasing LEFS to 63/80, and FOTO 66%.on 05-19-22. Pt has completed all STG but still has LTG to complete.   Pt with SLS and fatigues easily with less than  15 sec hold.   MMT and AROM within normal limits. But functional SL strength with tremors and fatigue easily. Would like to increase pt resilience and functional strength for remaining visits. Ms Ozier is improving in descending down steps but still with discomfort.  Ms Diego is unable to do her social and recreational pursuits due to her continuing pain in R achilles tendon. Pt was used to walking 4-5 miles at the beach but she is no longer able to walk for an extended time more than a mile and she feels she is progressively getting more tender on medial side of heel.  Ms Briski has used Iontophoresis on lateral heel one time and pain resolved. But ionto on medial heel has not improved pain.    Pt HEP modified  today for strengthening with decrease comfort. Pt may need consultation with MD if pt pain does not improve with evidence based loading techniques.       OBJECTIVE IMPAIRMENTS decreased activity tolerance, decreased knowledge of condition, decreased mobility, difficulty walking, decreased ROM, decreased strength, and pain.    ACTIVITY LIMITATIONS standing, squatting, stairs, and locomotion level   PARTICIPATION LIMITATIONS: cleaning, laundry, shopping, community activity, and walking for exercise   PERSONAL FACTORS  L  TKA 10-18-21, osteoporosis are also affecting patient's functional outcome.    REHAB POTENTIAL: Excellent   CLINICAL DECISION MAKING: Evolving/moderate complexity   EVALUATION COMPLEXITY: Moderate     GOALS: Goals reviewed with patient? Yes   SHORT TERM GOALS: Target date: 05/12/2022  Patient will be independent and compliant with initial HEP Baseline:no knowledged for achilles tendonitis HEP Goal status:MET   2.  Patient will demonstrate ability to perform 6 MWT in age appropriate normal range Baseline: TBD at 2nd visit  05-12-22 6MWT 1646ft (norm for age [phone] ft) to begin Goal status: MET   3.  Patient will maintain SLS for at least 30 seconds on the RLE to  improve stability when navigating on uneven terrain.  Baseline: 17 sec R 05-12-22  38 sec  Goal status: MET         LONG TERM GOALS: Target date: 06/02/2022  revised 07-12-22   Pt will be independent with advanced HEP Baseline: limited knowledge 05-31-22 using advanced program now Goal status: ONGOING   2.  Patient will report minimal difficulty with stair negotiation including no dependence with UE and alternating step descend Baseline: must use UE and one step at a time to go down step,  05-12-22  2/10 going down step. No pain going up step 05-31-22 pt improved but descending 2-3/10 Goal status: ONGOING   3.  Patient will score at least 60/80 on the LEFS to signify clinically meaningful improvement in functional abilities Baseline: 46/80 Goal status: MET 05/19/2022   4.  Pt will be able to walk 1 mile with minimal discomfort to increase walking for exercise tolerance Baseline:  Goal status: MET 05/19/2022   5.  FOTO will improve from 47%   to  68%   indicating improved functional mobility Baseline: eval 47% 05/19/2022 66% Goal status: ONGOING 05/19/2022         PLAN: PT FREQUENCY: 1x/week   PT DURATION: 6 weeks   PLANNED INTERVENTIONS: Therapeutic exercises, Therapeutic activity, Neuromuscular re-education, Balance training, Gait training, Patient/Family education, Self Care, Joint mobilization, Stair training, Dry Needling, Electrical stimulation, Spinal mobilization, Cryotherapy, Moist heat, Taping, Ionotophoresis 4mg /ml Dexamethasone, Manual therapy, and Re-evaluation   PLAN FOR NEXT SESSION:  KX modifiey Eccentric loading,   check iontophoresis  reeval next visit   Voncille Lo, PT, [place] Certified Exercise Expert for the Aging Adult  06/07/22 11:49 AM Phone: [phone] Fax: [phone]

## 2022-06-08 ENCOUNTER — Encounter: Payer: Self-pay | Admitting: Allergy

## 2022-06-08 ENCOUNTER — Ambulatory Visit: Payer: Medicare Other | Admitting: Allergy

## 2022-06-08 VITALS — BP 110/70 | HR 70 | Temp 98.1°F | Resp 16

## 2022-06-08 DIAGNOSIS — J4530 Mild persistent asthma, uncomplicated: Secondary | ICD-10-CM

## 2022-06-08 DIAGNOSIS — J301 Allergic rhinitis due to pollen: Secondary | ICD-10-CM

## 2022-06-08 MED ORDER — ALBUTEROL SULFATE HFA 108 (90 BASE) MCG/ACT IN AERS: 2.0000 | INHALATION_SPRAY | RESPIRATORY_TRACT | 1 refills | Status: DC | PRN

## 2022-06-08 MED ORDER — BUDESONIDE-FORMOTEROL FUMARATE 80-4.5 MCG/ACT IN AERO: 2.0000 | INHALATION_SPRAY | Freq: Two times a day (BID) | RESPIRATORY_TRACT | 5 refills | Status: DC

## 2022-06-08 NOTE — Patient Instructions (Signed)
Mild persistent asthma Daily controller medication(s): Symbicort 60mg 2 puffs once a day with spacer and rinse mouth afterwards. May use albuterol rescue inhaler 2 puffs or nebulizer every 4 to 6 hours as needed for shortness of breath, chest tightness, coughing, and wheezing. May use albuterol rescue inhaler 2 puffs 5 to 15 minutes prior to strenuous physical activities. Monitor frequency of use.  During upper respiratory infections/asthma flares: Start Symbicort 849m 2 puffs twice a day for 1-2 weeks Asthma control goals:  Full participation in all desired activities (may need albuterol before activity) Albuterol use two times or less a week on average (not counting use with activity) Cough interfering with sleep two times or less a month Oral steroids no more than once a year No hospitalizations   Allergic rhinitis 2020 skin testing was borderline positive to tree pollen.  Continue environmental control measures.  May use azelastine nasal spray 1-2 sprays per nostril 1-2 times a day as needed for runny nose. May use over the counter antihistamines such as Zyrtec (cetirizine), Claritin (loratadine), Allegra (fexofenadine), or Xyzal (levocetirizine) daily as needed. May take twice a day during flares.   Follow up in 6 months or sooner if needed.  Reducing Pollen Exposure Pollen seasons: trees (spring), grass (summer) and ragweed/weeds (fall). Keep windows closed in your home and car to lower pollen exposure.  Install air conditioning in the bedroom and throughout the house if possible.  Avoid going out in dry windy days - especially early morning. Pollen counts are highest between 5 - 10 AM and on dry, hot and windy days.  Save outside activities for late afternoon or after a heavy rain, when pollen levels are lower.  Avoid mowing of grass if you have grass pollen allergy. Be aware that pollen can also be transported indoors on people and pets.  Dry your clothes in an automatic dryer  rather than hanging them outside where they might collect pollen.  Rinse hair and eyes before bedtime.

## 2022-06-08 NOTE — Assessment & Plan Note (Signed)
Past history - Perennial rhinitis symptoms for the past 8 years. 2020 skin testing was borderline positive to tree pollen.  Interim history - asymptomatic with no meds.  Continue environmental control measures.   May use azelastine nasal spray 1-2 sprays per nostril 1-2 times a day as needed for runny nose.  May use over the counter antihistamines such as Zyrtec (cetirizine), Claritin (loratadine), Allegra (fexofenadine), or Xyzal (levocetirizine) daily as needed. May take twice a day during flares.

## 2022-06-08 NOTE — Assessment & Plan Note (Signed)
Past history - Issues with coughing, wheezing and SOB for the past 3-4 years with worsening during URIs. Used to be on Flovent, Qvar in the past. Tried PPIs in the past with no benefit. 2020 skin testing showed: Borderline positive to maple tree pollen. 2020 spirometry showed: mild possible restrictive disease with 14% improvement in FEV post bronchodilator treatment.  Interim history - only used albuterol once, controlled, no prednisone. Up to date with flu and Covid-19 vaccines.   Today's spirometry was normal.   Daily controller medication(s): Symbicort 72mg 2 puffs once a day with spacer and rinse mouth afterwards.  May use albuterol rescue inhaler 2 puffs or nebulizer every 4 to 6 hours as needed for shortness of breath, chest tightness, coughing, and wheezing. May use albuterol rescue inhaler 2 puffs 5 to 15 minutes prior to strenuous physical activities. Monitor frequency of use.   During upper respiratory infections/asthma flares: Start Symbicort 856m 2 puffs twice a day for 1-2 weeks  Get spirometry at next visit.

## 2022-06-14 ENCOUNTER — Encounter: Payer: Medicare Other | Admitting: Physical Therapy

## 2022-06-16 ENCOUNTER — Ambulatory Visit: Payer: Medicare Other | Admitting: Physical Therapy

## 2022-06-16 NOTE — Therapy (Incomplete)
OUTPATIENT PHYSICAL THERAPY TREATMENT NOTE       Patient Name: Mary Garcia MRN: [id number] DOB:1946-07-27, 76 y.o., female Today's Date: 06/16/2022  PCP: Elby Showers, MD REFERRING PROVIDER: Armond Hang MD  END OF SESSION:             Past Medical History:  Diagnosis Date   Anxiety    Arthritis    Asthma    Bronchitis, acute    Past Surgical History:  Procedure Laterality Date   BREAST ENHANCEMENT SURGERY     CATARACT EXTRACTION W/ INTRAOCULAR LENS IMPLANT Bilateral    FACELIFT     local   ROTATOR CUFF REPAIR Right    TIBIA FRACTURE SURGERY  2004   left leg x3, tibia and fibula   TOTAL KNEE ARTHROPLASTY Left 10/18/2021   Procedure: TOTAL KNEE ARTHROPLASTY;  Surgeon: Vickey Huger, MD;  Location: [facility];  Service: Orthopedics;  Laterality: Left;   TUMOR EXCISION  2003   right muscle back, benign   Patient Active Problem List   Diagnosis Date Noted   Seasonal allergic rhinitis due to pollen 05/17/2021   Mild persistent asthma without complication 75/05/2584   DDD (degenerative disc disease), cervical 09/15/2017   Depression 02/02/2016   Arthropathy of right shoulder 02/02/2016   Insomnia 04/13/2011   Osteoporosis 04/13/2011   Biliary dyskinesia 04/13/2011   Osteoarthritis 04/13/2011   History of migraine headaches 04/13/2011   Vitamin D deficiency 04/13/2011   Anxiety 04/13/2011   Irritable bowel syndrome 04/13/2011    REFERRING DIAG: M76.61 (ICD-10-CM) - Achilles tendinitis, right leg  THERAPY DIAG:  No diagnosis found.  Rationale for Evaluation and Treatment Rehabilitation  PERTINENT HISTORY: RTC R, Tibia fracture  2004/ rod removal, L  TKA 10-18-21, osteoporosis  PRECAUTIONS: none  SUBJECTIVE:  my Right foot is 4/10 today.  I have  been having more discomfort in my R ankle on the inside and on the achilles tendon.  I sometimes feel very swollen in that area.  My out side is better but when I press on it is some sore.  I walked a mile on  Saturday and Sunday.  I put a lift in my R shoe.  I  am not able to walk 4-5 miles on the beach with my girlfriends like I was able to do.  So it keeps me from doing my normal recreation and social   PAIN:  Are you having pain? Yes: NPRS scale: 10-17 -23  1/10 Pain location: R insertional achilles tendonitis Pain description: sharp, piercing throbbing Aggravating factors: standing for longer than 2 hours,  I don't walk more than 1/4 m Relieving factors: Meloxicam and Voltaren Can't walk more than 1/4 mile, limiting my usual 5 miles of walking, descending on steps worse than ascending.   OBJECTIVE: (objective measures completed at initial evaluation unless otherwise dated)  OBJECTIVE:    DIAGNOSTIC FINDINGS: none recent noted in Epic for R ankle   PATIENT SURVEYS:  LEFS 46/80  57.5% 05/19/2022 63/80 05-31-22   FOTO 47% predicted 68 05/19/2022 66% 6MWT 1651f (norm for age 74-1845ft)   COGNITION:           Overall cognitive status: Within functional limits for tasks assessed                          SENSATION: WFL       MUSCLE LENGTH: Hamstrings: WNL     POSTURE: rounded shoulders, forward head, and anterior pelvic  tilt WNL   PALPATION: TTP over insertional achilles R tendon   LOWER EXTREMITY ROM:   Active ROM Right eval Left eval RIGHT 05-26-22  Hip flexion       Hip extension       Hip abduction       Hip adduction       Hip internal rotation       Hip external rotation       Knee flexion       Knee extension       Ankle dorsiflexion 12 slight pain in end range 10 old fibular fx with scress/plates 12  Ankle plantarflexion 40 52 45  Ankle inversion _0 Ankle eversion 35 21 36   (Blank rows = not tested)   LOWER EXTREMITY MMT:   MMT Right eval Left eval  Hip flexion 5 5  Hip extension   5  Hip abduction 4 5  Hip adduction      Hip internal rotation 4+ 5  Hip external rotation 4+ 5  Knee flexion      Knee extension      Ankle dorsiflexion  4+ 5  Ankle plantarflexion 4 5  Ankle inversion 4+ 5  Ankle eversion 4+ 5   (Blank rows = not tested)       FUNCTIONAL TESTS:  5 times sit to stand: 8.01 sec   GAIT: Distance walked: 150 feet Assistive device utilized: None Level of assistance: Complete Independence Comments: slight antalgic gait       TODAY'S TREATMENT: [facility] Adult PT Treatment:                                                DATE: 06-07-22 Therapeutic Exercise: Supine PF against wall 45 sec x 5 with plantar flexion stretching between isometric holds [employer] pew sway in corner for 1 min BIL heel raise 2 x 10 Carma Leaven with R SLS and L leg pressed into wall x 6x 10 sec each March in place with 10 lb  for hip flexor 3 x 10 Manual Therapy: MTPR along the gastroc/ soleus Active release techniques for the gastroc/ soleus Talocrural distraction mobs grad III Use of IASTYM over medial achilles into gastroc Modalities  Iontophoresis 1 cc Dexamethasone in patch to medial heel /achilles to be removed in 4-6 hours  handout given to Pt for precautions and use/discard instructions.   [facility] Adult PT Treatment:                                                DATE: 05-31-22 Therapeutic Exercise: Heel raises BIL with 10 lb in Left hand, and 15 # in R hand 3 x 10 On 6 inch step  SL heel raise with 10 # in R hand 3 x 10 March in place with 10 lb  for hip flexor 3 x 10 Standing heel raise with 45 sec x 5 isometric hold and pain point Carma Leaven with R SLS and L leg pressed into wall x 10 x 10 sec each Step downs R 2 x 10 and step downs on L 2 x 10  Neuromuscular re-ed: In corner,  R  SLS on There ex working on skill for 2  min On there ex pad, around the [place] with 10 lb weight Manual Therapy: MTPR along the gastroc/ soleus Active release techniques for the gastroc/ soleus Talocrural distraction mobs grad III KT tape for edema to R lateral superior ankle over bruise Modalities  Iontophoresis 1 cc Dexamethasone in  patch to be removed in 4-6 hours  handout given to Pt for precautions and use/discard instructions.  [facility] Adult PT Treatment:                                                DATE: 05-26-22 Bil heel raise with UE support 1 x 15  knees bent Carma Leaven with R SLS and L leg pressed into wall Great toe flexion with GTB x 20 Single leg calf eccentrics (knee bent)   Do 2 sets of 15 reps Contract/ relax long sitting gastroc stretch 3 x 30 secon with 10 sec contraction 4 in step downs 2 x 15 with R UE support Back against wall and Bil DF x 30 Downward dog to push up x 5  Toe walking. Lunge on R x 10 with UE support . Manual Therapy: MTPR along the gastroc/ soleus Active release techniques for the gastroc/ soleus Talocrural distraction mobs grad III Modalities  Iontophoresis 1 cc Dexamethasone in patch to be removed in 4-6 hours  handout given to Pt for precautions and use/discard instructions.   [facility] Adult PT Treatment:                                                DATE: 05-24-22 herapeutic Exercise:  Bil heel raise with UE support 2 x 15  knees bent Standing heel raise with 45 sec x 5 isometric hold and pain point Single leg calf eccentrics (knee bent)   Do 2 sets of 15 reps Contract/ relax long sitting gastroc stretch 3 x 30 secon with 10 sec contraction 4 in step downs 2 x 15 with R UE support Back against wall and Bil DF x 30 Long sitting heel raise with focus on eccentric loading x 5 seconds 2 x 20    Manual Therapy: MTPR along the gastroc/ soleus Active release techniques for the gastroc/ soleus Talocrural distraction mobs grad III  [facility] Adult PT Treatment:                                                DATE: 05/19/2022 Therapeutic Exercise: Contract/ relax long sitting gastroc stretch 3 x 30 secon with 10 sec contraction Long sitting heel raise with focus on eccentric loading x 5 seconds 2 x 20   -reviewed and updated HEP today Manual Therapy: MTPR along the gastroc/  soleus Active release techniques for the gastroc/ soleus Talocrural distraction mobs grad III    [facility] Adult PT Treatment:                                                DATE: 05-12-22 6MWT 1618f (norm for age  74-1103 ft) to begin Therapeutic Exercise:  Heel raise 3 x 10 at max range  bearing wt through great toe Eccentric Plantar Fascia Strengthening on Step   3 x 10 reps Kickstand deadlift with 15lb on R  3 x 10 with rest between walking it out 45 second isometric R heel raise straight knee  x 3 with 90 sec rest between reps 45 second isometric R heel raise with bent knee  x 3 with 90 sec rest between rep Single leg calf eccentrics (knee bent)   Do 2 sets of 15 reps Standing Plantar Fascia Mobilization with Small Ball  - 1 x daily - 7 x weekly - 2 sets - 10 reps Single limb bridge with 10 lb 3 x 10 on R SLS on there ex pad  over 3 min SLS on R LE with vectors with L for 3 min Manual Therapy: STW ot R gastroc/achilles tendon concentrating on medial gastroc and medial achilles tendon  IASTYM   [facility] Adult PT Treatment:                                                DATE: 05-04-22 Therapeutic Exercise: Kickstand deadlift with 10 lb on R  3 x 10 with rest between 45 second isometric R heel raise straight knee  x 3 with 90 sec rest between reps 45 second isometric R heel raise with bent knee  x 3 with 90 sec rest between reps Eccentric Plantar Fascia Strengthening on Step   3 x 10 reps Single leg calf eccentrics (knee bent)   Do 2 sets of 15 reps Standing Plantar Fascia Mobilization with Small Ball  - 1 x daily - 7 x weekly - 2 sets - 10 reps  STW ot R gastroc/achilles tendon concentrating on medial gastroc and medial achilles tendon Trigger Point Dry Needling Treatment: Pre-treatment instruction: Patient instructed on dry needling rationale, procedures, and possible side effects including pain during treatment (achy,cramping feeling), bruising, drop of blood, lightheadedness, nausea,  sweating. Patient Consent Given: Yes Education handout provided: Previously provided Muscles treated: Left medial gastroc, achilles tendon  Needle size and number: .30x35m x 4 Electrical stimulation performed: Yes Parameters:  25 pps to pt tolerance  alligator clips Treatment response/outcome: Palpable decrease in muscle tension Post-treatment instructions: Patient instructed to expect possible mild to moderate muscle soreness later today and/or tomorrow. Patient instructed in methods to reduce muscle soreness and to continue prescribed HEP. If patient was dry needled over the lung field, patient was instructed on signs and symptoms of pneumothorax and, however unlikely, to see immediate medical attention should they occur. Patient was also educated on signs and symptoms of infection and to seek medical attention should they occur. Patient verbalized understanding of these instructions and education.      PATIENT EDUCATION:  Education details: POC Explanation of findings, initial HEP Person educated: Patient Education method: Explanation, Demonstration, Tactile cues, Verbal cues, and Handouts Education comprehension: verbalized understanding, returned demonstration, verbal cues required, tactile cues required, and needs further education     HOME EXERCISE PROGRAM:  Access Code: [email or web address] URL: [email or web address] Date: 06/07/2022 Prepared by: LVoncille Lo Program Notes Single leg calf eccentrics (knee bent) Hold onto a door frame or counter, sit with hips back, bend your knees and lower into 1/2 squat, Maintain angle, perform a double leg calf raise. At top position,  shift your weight onto one leg and do a single leg eccentric into starting position. then lift again with both legs and repeat - two, up, one down   Do 2 sets of 15 repsCaptain Morgan exercise   hold for 1 min rest and 2 more rounds  Exercises - Calf Mobilization with Foam Roll  - 1 x daily - 7 x weekly -  2 sets - 10 reps - Soleus Mobilization  - 1 x daily - 7 x weekly - 2 sets - 10 reps - Eccentric Plantar Fascia Strengthening on Step  - 1-2 x daily - 7 x weekly - 3 sets - 10 reps - Split Squat Forward Trunk with Dumbbells  - 1 x daily - 7 x weekly - 3 sets - 10 reps - Bridge with Hamstring Curl on Swiss Ball  - 1 x daily - 7 x weekly - 3 sets - 10 reps - Single Leg Isometric Heel Raise at [employer] with [employer]  - 1 x daily - 7 x weekly - 1 sets - 5 reps - 45 hold - Single Leg Bridge  - 1 x daily - 7 x weekly - 3 sets - 10 reps - Long Sitting Eccentric Ankle Plantar Flexion with Resistance  - 1 x daily - 7 x weekly - 3 sets - 15 reps - Seated heel raise with 20 lb weight  - 1 x daily - 7 x weekly - 3 sets - 10 reps - Glut med wall press /captain morgan  - 1 x daily - 7 x weekly - 3 sets - 10 reps  CLINICAL IMPRESSION:  Pt arrives to session reporting 4/10  Pt has been discouraged over continuing pain over R achilles tendon  Ms Lopezmartinez has made appt with MD to check on continuing pain and hoping for ultrasound diagnostic.  She is making progress with PT increasing LEFS to 63/80, and FOTO 66%.on 05-19-22. Pt has completed all STG but still has LTG to complete.   Pt with SLS and fatigues easily with less than 15 sec hold.   MMT and AROM within normal limits. But functional SL strength with tremors and fatigue easily. Would like to increase pt resilience and functional strength for remaining visits. Ms Cerullo is improving in descending down steps but still with discomfort.  Ms Kist is unable to do her social and recreational pursuits due to her continuing pain in R achilles tendon. Pt was used to walking 4-5 miles at the beach but she is no longer able to walk for an extended time more than a mile and she feels she is progressively getting more tender on medial side of heel.  Ms Lowdermilk has used Iontophoresis on lateral heel one time and pain resolved. But ionto on medial heel has not improved pain.    Pt HEP  modified today for strengthening with decrease comfort. Pt may need consultation with MD if pt pain does not improve with evidence based loading techniques.       OBJECTIVE IMPAIRMENTS decreased activity tolerance, decreased knowledge of condition, decreased mobility, difficulty walking, decreased ROM, decreased strength, and pain.    ACTIVITY LIMITATIONS standing, squatting, stairs, and locomotion level   PARTICIPATION LIMITATIONS: cleaning, laundry, shopping, community activity, and walking for exercise   PERSONAL FACTORS  L  TKA 10-18-21, osteoporosis are also affecting patient's functional outcome.    REHAB POTENTIAL: Excellent   CLINICAL DECISION MAKING: Evolving/moderate complexity   EVALUATION COMPLEXITY: Moderate     GOALS: Goals reviewed  with patient? Yes   SHORT TERM GOALS: Target date: 05/12/2022  Patient will be independent and compliant with initial HEP Baseline:no knowledged for achilles tendonitis HEP Goal status:MET   2.  Patient will demonstrate ability to perform 6 MWT in age appropriate normal range Baseline: TBD at 2nd visit  05-12-22 6MWT 1666f (norm for age [phone]) to begin Goal status: MET   3.  Patient will maintain SLS for at least 30 seconds on the RLE to improve stability when navigating on uneven terrain.  Baseline: 17 sec R 05-12-22  38 sec  Goal status: MET         LONG TERM GOALS: Target date: 06/02/2022  revised 07-12-22   Pt will be independent with advanced HEP Baseline: limited knowledge 05-31-22 using advanced program now Goal status: ONGOING   2.  Patient will report minimal difficulty with stair negotiation including no dependence with UE and alternating step descend Baseline: must use UE and one step at a time to go down step,  05-12-22  2/10 going down step. No pain going up step 05-31-22 pt improved but descending 2-3/10 Goal status: ONGOING   3.  Patient will score at least 60/80 on the LEFS to signify clinically meaningful  improvement in functional abilities Baseline: 46/80 Goal status: MET 05/19/2022   4.  Pt will be able to walk 1 mile with minimal discomfort to increase walking for exercise tolerance Baseline:  Goal status: MET 05/19/2022   5.  FOTO will improve from 47%   to  68%   indicating improved functional mobility Baseline: eval 47% 05/19/2022 66% Goal status: ONGOING 05/19/2022         PLAN: PT FREQUENCY: 1x/week   PT DURATION: 6 weeks   PLANNED INTERVENTIONS: Therapeutic exercises, Therapeutic activity, Neuromuscular re-education, Balance training, Gait training, Patient/Family education, Self Care, Joint mobilization, Stair training, Dry Needling, Electrical stimulation, Spinal mobilization, Cryotherapy, Moist heat, Taping, Ionotophoresis 461mml Dexamethasone, Manual therapy, and Re-evaluation   PLAN FOR NEXT SESSION:  KX modifiey Eccentric loading,   check iontophoresis  reeval next visit   ***

## 2022-06-21 ENCOUNTER — Ambulatory Visit: Payer: Medicare Other | Admitting: Physical Therapy

## 2022-06-21 ENCOUNTER — Ambulatory Visit (HOSPITAL_BASED_OUTPATIENT_CLINIC_OR_DEPARTMENT_OTHER): Payer: Medicare Other

## 2022-06-21 DIAGNOSIS — M7661 Achilles tendinitis, right leg: Secondary | ICD-10-CM | POA: Diagnosis not present

## 2022-06-23 DIAGNOSIS — M79641 Pain in right hand: Secondary | ICD-10-CM | POA: Diagnosis not present

## 2022-06-23 DIAGNOSIS — M65341 Trigger finger, right ring finger: Secondary | ICD-10-CM | POA: Diagnosis not present

## 2022-06-28 ENCOUNTER — Ambulatory Visit: Payer: Medicare Other | Admitting: Physical Therapy

## 2022-06-30 ENCOUNTER — Other Ambulatory Visit: Payer: Self-pay | Admitting: Internal Medicine

## 2022-07-05 ENCOUNTER — Encounter: Payer: Medicare Other | Admitting: Physical Therapy

## 2022-07-11 DIAGNOSIS — M25571 Pain in right ankle and joints of right foot: Secondary | ICD-10-CM | POA: Diagnosis not present

## 2022-07-19 DIAGNOSIS — D2361 Other benign neoplasm of skin of right upper limb, including shoulder: Secondary | ICD-10-CM | POA: Diagnosis not present

## 2022-07-19 DIAGNOSIS — D2230 Melanocytic nevi of unspecified part of face: Secondary | ICD-10-CM | POA: Diagnosis not present

## 2022-07-19 DIAGNOSIS — D2272 Melanocytic nevi of left lower limb, including hip: Secondary | ICD-10-CM | POA: Diagnosis not present

## 2022-07-19 DIAGNOSIS — D485 Neoplasm of uncertain behavior of skin: Secondary | ICD-10-CM | POA: Diagnosis not present

## 2022-07-19 DIAGNOSIS — M7661 Achilles tendinitis, right leg: Secondary | ICD-10-CM | POA: Diagnosis not present

## 2022-08-01 ENCOUNTER — Encounter: Payer: Self-pay | Admitting: Internal Medicine

## 2022-08-16 ENCOUNTER — Ambulatory Visit (HOSPITAL_BASED_OUTPATIENT_CLINIC_OR_DEPARTMENT_OTHER)
Admission: RE | Admit: 2022-08-16 | Discharge: 2022-08-16 | Disposition: A | Discharge status: Home / Self Care | Payer: Medicare Other | Source: Ambulatory Visit | Attending: Internal Medicine | Admitting: Internal Medicine

## 2022-08-16 DIAGNOSIS — Z8249 Family history of ischemic heart disease and other diseases of the circulatory system: Secondary | ICD-10-CM | POA: Insufficient documentation

## 2022-08-22 ENCOUNTER — Ambulatory Visit: Payer: Medicare Other | Attending: Internal Medicine | Admitting: Physical Therapy

## 2022-08-22 ENCOUNTER — Encounter: Payer: Self-pay | Admitting: Physical Therapy

## 2022-08-22 DIAGNOSIS — G8929 Other chronic pain: Secondary | ICD-10-CM | POA: Diagnosis not present

## 2022-08-22 DIAGNOSIS — R262 Difficulty in walking, not elsewhere classified: Secondary | ICD-10-CM | POA: Insufficient documentation

## 2022-08-22 DIAGNOSIS — M6281 Muscle weakness (generalized): Secondary | ICD-10-CM | POA: Diagnosis not present

## 2022-08-22 DIAGNOSIS — M25571 Pain in right ankle and joints of right foot: Secondary | ICD-10-CM | POA: Diagnosis not present

## 2022-08-22 DIAGNOSIS — M25562 Pain in left knee: Secondary | ICD-10-CM | POA: Insufficient documentation

## 2022-08-22 DIAGNOSIS — R600 Localized edema: Secondary | ICD-10-CM | POA: Diagnosis not present

## 2022-08-22 NOTE — Therapy (Signed)
OUTPATIENT PHYSICAL THERAPY LOWER EXTREMITY EVALUATION   Patient Name: Mary Garcia MRN: [id number] DOB:[birth year], 77 y.o., female Today's Date: 08/22/2022  END OF SESSION:  PT End of Session - 08/22/22 0932     Visit Number 1    Number of Visits 16    Date for PT Re-Evaluation 10/17/22    Authorization Type BCBS    PT Start Time 0932    PT Stop Time 1030    PT Time Calculation (min) 58 min    Activity Tolerance Patient tolerated treatment well;Patient limited by pain             Past Medical History:  Diagnosis Date   Anxiety    Arthritis    Asthma    Bronchitis, acute    Past Surgical History:  Procedure Laterality Date   BREAST ENHANCEMENT SURGERY     CATARACT EXTRACTION W/ INTRAOCULAR LENS IMPLANT Bilateral    FACELIFT     local   ROTATOR CUFF REPAIR Right    TIBIA FRACTURE SURGERY  2004   left leg x3, tibia and fibula   TOTAL KNEE ARTHROPLASTY Left 10/18/2021   Procedure: TOTAL KNEE ARTHROPLASTY;  Surgeon: Vickey Huger, MD;  Location: [facility];  Service: Orthopedics;  Laterality: Left;   TUMOR EXCISION  2003   right muscle back, benign   Patient Active Problem List   Diagnosis Date Noted   Seasonal allergic rhinitis due to pollen 05/17/2021   Mild persistent asthma without complication 72/53/6644   DDD (degenerative disc disease), cervical 09/15/2017   Depression 02/02/2016   Arthropathy of right shoulder 02/02/2016   Insomnia 04/13/2011   Osteoporosis 04/13/2011   Biliary dyskinesia 04/13/2011   Osteoarthritis 04/13/2011   History of migraine headaches 04/13/2011   Vitamin D deficiency 04/13/2011   Anxiety 04/13/2011   Irritable bowel syndrome 04/13/2011    PCP: Elby Showers, MD   REFERRING PROVIDER: Armond Hang, MD   REFERRING DIAG:  Achilles tendinitis, right leg [M76.61]   THERAPY DIAG:  Pain in right ankle and joints of right foot  Muscle weakness (generalized)  Localized edema  Rationale for Evaluation and Treatment:  Rehabilitation  ONSET DATE: Started 02/11/22 and progressively worsened  SUBJECTIVE:   SUBJECTIVE STATEMENT: This reported after moving on 02/11/22 which she attributes to doing a lot of moving boxes and going up / down steps. She reports PT previously felt like it didn't help as much but also reports she felt she didn't take it easy as prescribed by her providers. She reports she continued pain that occurs throughout the day. She reports the pain is better in the AM but reports after being up for a few hours then the pain starts progressively worsens.   PERTINENT HISTORY:  L  TKA 10-18-21 ,Osteoporosis PAIN:  Are you having pain? Yes: NPRS scale:  at best 0/10 at worst 7/10 Pain location: back of the ankle Pain description: burning, aching, sore, sharp / shoot.  Aggravating factors: any standing/ walking, descending steps Relieving factors: sitting/ resting  PRECAUTIONS: None  WEIGHT BEARING RESTRICTIONS: No  FALLS:  Has patient fallen in last 6 months? No  LIVING ENVIRONMENT: Lives with: lives with their spouse Lives in: House/apartment Stairs: Yes: Internal: 12 steps; on left going up and External: 2 steps; none Has following equipment at home:  [facility]   OCCUPATION: Retired  PLOF: Independent  PATIENT GOALS: stop having so much pain  NEXT MD VISIT:   OBJECTIVE:   DIAGNOSTIC FINDINGS: MRI at MD's office which  pt provided report for Impression of r ankle MRI 07/11/2022 Severe distal achilles tendinosis with high-grade partial tearing of the tendon at its calcaneal insertion. No significant associated tendon retraction Mild to moderate retrocalcanel bursitis with associated with haglund defomrityof the posterior calcaneus.  Nondisplaced split tearing of the peroneus longus with retromalleolar groove and peroneal brevis near the level of the peroneal tubercle Subacute/chronic partial tearing of the ATFL. Additional suspected chronic partial tearing/degenerationg of the  superomedial of the calcanavicular spring ligament.  PATIENT SURVEYS:  FOTO 47% and predicted 61%  COGNITION: Overall cognitive status: Within functional limits for tasks assessed     SENSATION: WFL  EDEMA:  Figure 8: R 49 cm, 47.cm  POSTURE: rounded shoulders, forward head, and anterior pelvic tilt WNL   PALPATION: TTP along the posterior cancanela tubercle with associated swelling noted as a result of haglunds deformity. Multiple trigger points  in the R calf with specific soreness along the medial soleus.  LOWER EXTREMITY ROM:  Active/ passive ROM Right eval Left eval  Hip flexion    Hip extension    Hip abduction    Hip adduction    Hip internal rotation    Hip external rotation    Knee flexion    Knee extension    Ankle dorsiflexion A2/10P A9  Ankle plantarflexion 68 51  Ankle inversion [facility] WFL  Ankle eversion WFL WFL   (Blank rows = not tested)  LOWER EXTREMITY MMT:  MMT Right eval Left eval  Hip flexion    Hip extension    Hip abduction    Hip adduction    Hip internal rotation    Hip external rotation    Knee flexion    Knee extension    Ankle dorsiflexion 5+ 5+  Ankle plantarflexion 3+ P! 5+4  Ankle inversion 4+ 4+  Ankle eversion 4+ 4+  Posterior tibalis R 3=/5 P!    L 5 Peroneal brevis/ longus (4- with P!)  L 5   (Blank rows = not tested)  LOWER EXTREMITY SPECIAL TESTS:  N/A  FUNCTIONAL TESTS:  6 min walk test - performed next visit.   GAIT: Distance walked: 120 ft to tx room Assistive device utilized: None Level of assistance: Complete Independence Comments: decreased stride on RLE with antalgic pattern noted, with decreased toe off compared bil   TODAY'S TREATMENT:                                                                                                                              [facility] Adult PT Treatment:                                                DATE: EVALUATION Therapeutic Exercise: Seated calf stretch with strap 2  x 30 sec Bil ankle PF with RLE eccentric loading x  3-5 seconds 1 x 10 progressed to single leg 1 x 10 with cues to keep knee straight Manual Therapy: IASTM along the R gastro/ soleus Active release of gastroc/soleus   Trigger Point Dry-Needling  Treatment instructions: Expect mild to moderate muscle soreness. S/S of pneumothorax if dry needled over a lung field, and to seek immediate medical attention should they occur. Patient verbalized understanding of these instructions and education.  Patient Consent Given: Yes Education handout provided: Yes Muscles treated: R gastroc/ soleus Electrical stimulation performed: No Parameters: N/A Treatment response/outcome: twitch response    PATIENT EDUCATION:  Education details: Evaluation findings, POC, Goals, HEP with proper form/ rationale.  Person educated: Patient Education method: Explanation, Verbal cues, and Handouts Education comprehension: verbalized understanding  HOME EXERCISE PROGRAM: Access Code: [email or web address] URL: [email or web address] Date: 08/22/2022 Prepared by: Starr Lake  Exercises - Seated Calf Stretch with Strap  - 3 x daily - 7 x weekly - 2 sets - 2 reps - 30 sec hold - Gastroc Stretch on Wall  - 3 x daily - 7 x weekly - 2 sets - 2 reps - 30 sec hold - Ankle Plantar flexion  - 1 x daily - 2 sets - 10 reps - Seated Toe Towel Scrunches  - 1 x daily - 7 x weekly - 2 sets - 10 reps  ASSESSMENT:  CLINICAL IMPRESSION: Patient is a 77 y.o. F who was seen today for physical therapy evaluation and treatment for R ankle / achille pain.  Pt reports hx of R ankle pain that has been present since she moved homes in 02/11/22 and has progressively worsened. TTP along the posterior calcaneal tubercle and distance achilles as well as distal peroneals and bil gastroc/ soleus with trigger points noted. She demonstrates limited ankle DF and weakness with MMT of peroneals and gastroc/ soleus secondary to pain. She  demonstrates an antalgic gait pattern with limited stride / toe off on the R compared bil. Reviewed TPDN and consent was provied for the R gastroc/solues followed with IASTM / active release techniques. Reviewed stretching and HEP which she noted improvement in tension. She would benefit from physical therapy to decrease R ankle pain, improve DF ROM, increase gross strength/ stability and maximize gait efficency as well as overall function by addressing the deficits listed.   OBJECTIVE IMPAIRMENTS: Abnormal gait, decreased activity tolerance, decreased balance, decreased endurance, decreased strength, increased edema, increased fascial restrictions, increased muscle spasms, improper body mechanics, postural dysfunction, and pain.   ACTIVITY LIMITATIONS: carrying, lifting, standing, squatting, and stairs  PARTICIPATION LIMITATIONS: driving, shopping, and community activity  PERSONAL FACTORS: Age, Past/current experiences, and 1 comorbidity: osteoporosis  are also affecting patient's functional outcome.   REHAB POTENTIAL: Good  CLINICAL DECISION MAKING: Evolving/moderate complexity  EVALUATION COMPLEXITY: Moderate   GOALS: Goals reviewed with patient? Yes  SHORT TERM GOALS: Target date: 09/19/2022   Pt to be IND with initial HEP for therapeutic progression Baseline: Goal status: INITIAL  2.  Pt to report max pain to </= 5/10 to demo improving condition Baseline:  Goal status: INITIAL  3.  Pt to demonstrate efficient gait pattern with report of </= 5/10 pain to reduce potential proximal kinetic chain problems Baseline:  Goal status: INITIAL  4.  Pt to increase baseline FOTO score to >/= 56%  Baseline:  Goal status: INITIAL  5.  Reduce ankle swelling by >/= 1 cm to promote ankle mobility Baseline:  Goal status: INITIAL  LONG TERM GOALS: Target date: 10/17/2022  Increase R ankle DF  to >/= 8 degrees with no report of pain during assessment to assist with toe clearance with gait.   Baseline:  Goal status: INITIAL  2.  Increase R ankle gross strength to >/= 4/5 with </=2.10 max pain during testing to demo improvement in ankle strength/ stability.  Baseline:  Goal status: INITIAL  3.  Increase FOTO score to >/= 61% to demo improvement in function Baseline:  Goal status: INITIAL  4.  Pt to report improvement in descending steps and heavy duty house chores/ ADLS with max pain of </= 2/10 pain Baseline:  Goal status: INITIAL  5.  Pt be able to peform 6 min walk test achieve appropriate age specific distance of ~1545 ft with </= 2/10 max pain Baseline:  Goal status: INITIAL   PLAN:  PT FREQUENCY: 1-2x/week  PT DURATION: 8 weeks  PLANNED INTERVENTIONS: Therapeutic exercises, Therapeutic activity, Neuromuscular re-education, Balance training, Gait training, Patient/Family education, Self Care, Joint mobilization, Joint manipulation, Stair training, Aquatic Therapy, Dry Needling, Cryotherapy, Moist heat, Taping, Vasopneumatic device, Ultrasound, Ionotophoresis '4mg'$ /ml Dexamethasone, Manual therapy, and Re-evaluation  PLAN FOR NEXT SESSION: review/ update HEP PRN. Response to TPDN for the R calf continue PRN, ankle strengthening both intrinsic/ extrinsic, gait training.    Chevis Weisensel PT, DPT, LAT, ATC  08/22/22  10:55 AM

## 2022-08-30 ENCOUNTER — Ambulatory Visit: Payer: Medicare Other | Admitting: Physical Therapy

## 2022-08-30 ENCOUNTER — Other Ambulatory Visit: Payer: Self-pay

## 2022-08-30 ENCOUNTER — Encounter: Payer: Self-pay | Admitting: Physical Therapy

## 2022-08-30 DIAGNOSIS — R600 Localized edema: Secondary | ICD-10-CM | POA: Diagnosis not present

## 2022-08-30 DIAGNOSIS — M25571 Pain in right ankle and joints of right foot: Secondary | ICD-10-CM | POA: Diagnosis not present

## 2022-08-30 DIAGNOSIS — R262 Difficulty in walking, not elsewhere classified: Secondary | ICD-10-CM

## 2022-08-30 DIAGNOSIS — G8929 Other chronic pain: Secondary | ICD-10-CM | POA: Diagnosis not present

## 2022-08-30 DIAGNOSIS — M6281 Muscle weakness (generalized): Secondary | ICD-10-CM | POA: Diagnosis not present

## 2022-08-30 DIAGNOSIS — M25562 Pain in left knee: Secondary | ICD-10-CM | POA: Diagnosis not present

## 2022-08-30 NOTE — Therapy (Signed)
OUTPATIENT PHYSICAL THERAPY LOWER EXTREMITY TREATMENT NOTE   Patient Name: Mary Garcia MRN: [id number] DOB:[birth year], 77 y.o., female Today's Date: 08/30/2022  END OF SESSION:  PT End of Session - 08/30/22 1036     Visit Number 2    Number of Visits 16    Date for PT Re-Evaluation 10/17/22    Authorization Type BCBS    PT Start Time 0932    PT Stop Time 1015    PT Time Calculation (min) 43 min             Past Medical History:  Diagnosis Date   Anxiety    Arthritis    Asthma    Bronchitis, acute    Past Surgical History:  Procedure Laterality Date   BREAST ENHANCEMENT SURGERY     CATARACT EXTRACTION W/ INTRAOCULAR LENS IMPLANT Bilateral    FACELIFT     local   ROTATOR CUFF REPAIR Right    TIBIA FRACTURE SURGERY  2004   left leg x3, tibia and fibula   TOTAL KNEE ARTHROPLASTY Left 10/18/2021   Procedure: TOTAL KNEE ARTHROPLASTY;  Surgeon: Vickey Huger, MD;  Location: [facility];  Service: Orthopedics;  Laterality: Left;   TUMOR EXCISION  2003   right muscle back, benign   Patient Active Problem List   Diagnosis Date Noted   Seasonal allergic rhinitis due to pollen 05/17/2021   Mild persistent asthma without complication 63/87/5643   DDD (degenerative disc disease), cervical 09/15/2017   Depression 02/02/2016   Arthropathy of right shoulder 02/02/2016   Insomnia 04/13/2011   Osteoporosis 04/13/2011   Biliary dyskinesia 04/13/2011   Osteoarthritis 04/13/2011   History of migraine headaches 04/13/2011   Vitamin D deficiency 04/13/2011   Anxiety 04/13/2011   Irritable bowel syndrome 04/13/2011    PCP: Elby Showers, MD   REFERRING PROVIDER: Armond Hang, MD   REFERRING DIAG:  Achilles tendinitis, right leg [M76.61]   THERAPY DIAG:  Pain in right ankle and joints of right foot  Muscle weakness (generalized)  Localized edema  Difficulty in walking, not elsewhere classified  Chronic pain of left knee  Rationale for Evaluation and Treatment:  Rehabilitation  ONSET DATE: Started 02/11/22 and progressively worsened  SUBJECTIVE:   SUBJECTIVE STATEMENT: I have been doing better with the nitro patch and the needling. I am taking it slow. Pt is doing well but it is the morning. I am about a 2/10 today   Eval- This reported after moving on 02/11/22 which she attributes to doing a lot of moving boxes and going up / down steps. She reports PT previously felt like it didn't help as much but also reports she felt she didn't take it easy as prescribed by her providers. She reports she continued pain that occurs throughout the day. She reports the pain is better in the AM but reports after being up for a few hours then the pain starts progressively worsens.   PERTINENT HISTORY:  L  TKA 10-18-21 ,Osteoporosis PAIN:  Are you having pain? Yes: NPRS scale:  at best 0/10 at worst 7/10 Pain location: back of the ankle Pain description: burning, aching, sore, sharp / shoot.  Aggravating factors: any standing/ walking, descending steps Relieving factors: sitting/ resting  PRECAUTIONS: None  WEIGHT BEARING RESTRICTIONS: No  FALLS:  Has patient fallen in last 6 months? No  LIVING ENVIRONMENT: Lives with: lives with their spouse Lives in: House/apartment Stairs: Yes: Internal: 12 steps; on left going up and External: 2 steps; none Has  following equipment at home:  [facility]   OCCUPATION: Retired  PLOF: Independent  PATIENT GOALS: stop having so much pain  NEXT MD VISIT:   OBJECTIVE:   DIAGNOSTIC FINDINGS: MRI at MD's office which pt provided report for Impression of r ankle MRI 07/11/2022 Severe distal achilles tendinosis with high-grade partial tearing of the tendon at its calcaneal insertion. No significant associated tendon retraction Mild to moderate retrocalcanel bursitis with associated with haglund defomrityof the posterior calcaneus.  Nondisplaced split tearing of the peroneus longus with retromalleolar groove and peroneal brevis  near the level of the peroneal tubercle Subacute/chronic partial tearing of the ATFL. Additional suspected chronic partial tearing/degenerationg of the superomedial of the calcanavicular spring ligament.  PATIENT SURVEYS:  FOTO 47% and predicted 61%  COGNITION: Overall cognitive status: Within functional limits for tasks assessed     SENSATION: WFL  EDEMA:  Figure 8: R 49 cm, 47.cm  POSTURE: rounded shoulders, forward head, and anterior pelvic tilt WNL   PALPATION: TTP along the posterior cancanela tubercle with associated swelling noted as a result of haglunds deformity. Multiple trigger points  in the R calf with specific soreness along the medial soleus.  LOWER EXTREMITY ROM:  Active/ passive ROM Right eval Left eval  Hip flexion    Hip extension    Hip abduction    Hip adduction    Hip internal rotation    Hip external rotation    Knee flexion    Knee extension    Ankle dorsiflexion A2/10P A9  Ankle plantarflexion 68 51  Ankle inversion [facility] WFL  Ankle eversion WFL WFL   (Blank rows = not tested)  LOWER EXTREMITY MMT:  MMT Right eval Left eval  Hip flexion    Hip extension    Hip abduction    Hip adduction    Hip internal rotation    Hip external rotation    Knee flexion    Knee extension    Ankle dorsiflexion 5+ 5+  Ankle plantarflexion 3+ P! 5+4  Ankle inversion 4+ 4+  Ankle eversion 4+ 4+  Posterior tibalis R 3=/5 P!    L 5 Peroneal brevis/ longus (4- with P!)  L 5   (Blank rows = not tested)  LOWER EXTREMITY SPECIAL TESTS:  N/A  FUNCTIONAL TESTS:  6 min walk test - performed next visit.   GAIT: Distance walked: 120 ft to tx room Assistive device utilized: None Level of assistance: Complete Independence Comments: decreased stride on RLE with antalgic pattern noted, with decreased toe off compared bil   TODAY'S TREATMENT:   [facility] Adult PT Treatment:                                                DATE: 08-30-22 Therapeutic  Exercise: Seated Calf Stretch with Strap 2 reps - 30 sec hold Gastroc Stretch on Wall  2 reps - 30 sec hold Ankle Plantar flexion 2 sets - 10 reps Woodpeckers One Leg Anterior  RT 3 x 10 Woodpecker one Leg medial RT 3 x 10 Woodpecker one leg lateral RT 3 x 10 Seated Calf Raise with Weights on Thighs On Step  with 10 lb 3 sets - 10 reps Seated Ankle Eversion with GTB 3 sets - 10 reps Seated Figure 4 Ankle Inversion with GTB 3 x 10 SELF CARE-  Ice massage education for pt to do  at home using paper cup.  PT simulated using water bottle at clinic and educated on technique and execution with return demo                                                                                                                          [facility] Adult PT Treatment:                                                DATE: EVALUATION Therapeutic Exercise: Seated calf stretch with strap 2 x 30 sec Bil ankle PF with RLE eccentric loading x 3-5 seconds 1 x 10 progressed to single leg 1 x 10 with cues to keep knee straight Manual Therapy: IASTM along the R gastro/ soleus Active release of gastroc/soleus   Trigger Point Dry-Needling  Treatment instructions: Expect mild to moderate muscle soreness. S/S of pneumothorax if dry needled over a lung field, and to seek immediate medical attention should they occur. Patient verbalized understanding of these instructions and education.  Patient Consent Given: Yes Education handout provided: Yes Muscles treated: R gastroc/ soleus Electrical stimulation performed: No Parameters: N/A Treatment response/outcome: twitch response    PATIENT EDUCATION:  Education details: Evaluation findings, POC, Goals, HEP with proper form/ rationale.  Person educated: Patient Education method: Explanation, Verbal cues, and Handouts Education comprehension: verbalized understanding  HOME EXERCISE PROGRAM: Access Code: [email or web address] URL: [email or web address] Date:  08/30/2022 Prepared by: Voncille Lo  Exercises - Seated Calf Stretch with Strap  - 3 x daily - 7 x weekly - 2 sets - 2 reps - 30 sec hold - Gastroc Stretch on Wall  - 3 x daily - 7 x weekly - 2 sets - 2 reps - 30 sec hold - Ankle Plantar flexion  - 1 x daily - 2 sets - 10 reps - Woodpeckers One Leg Every OTHER day  - 1 x daily - 7 x weekly - 2-3 sets - 10 reps - Seated Calf Raise with Weights on Thighs On Step  - 1 x daily - 7 x weekly - 3 sets - 10 reps - Seated Ankle Eversion with Resistance  - 1 x daily - 7 x weekly - 3 sets - 10 reps - Seated Figure 4 Ankle Inversion with Resistance  - 1 x daily - 7 x weekly - 3 sets - 10 reps  ASSESSMENT:  CLINICAL IMPRESSION: Pt returns for 2nd visit to clinic. Pt declined TPDN today due to going to see [employer] tomorrow.  Pt session concentrated on HEP and return demo of new exercises.  Pt with 2/10 pain today in the AM when she typically handles pain better.  Will continue with POC.  EVAL- Patient is a 77 y.o. F who was seen today for physical therapy evaluation and treatment for R ankle / achille pain.  Pt reports hx of  R ankle pain that has been present since she moved homes in 02/11/22 and has progressively worsened. TTP along the posterior calcaneal tubercle and distance achilles as well as distal peroneals and bil gastroc/ soleus with trigger points noted. She demonstrates limited ankle DF and weakness with MMT of peroneals and gastroc/ soleus secondary to pain. She demonstrates an antalgic gait pattern with limited stride / toe off on the R compared bil. Reviewed TPDN and consent was provied for the R gastroc/solues followed with IASTM / active release techniques. Reviewed stretching and HEP which she noted improvement in tension. She would benefit from physical therapy to decrease R ankle pain, improve DF ROM, increase gross strength/ stability and maximize gait efficency as well as overall function by addressing the deficits listed.    OBJECTIVE IMPAIRMENTS: Abnormal gait, decreased activity tolerance, decreased balance, decreased endurance, decreased strength, increased edema, increased fascial restrictions, increased muscle spasms, improper body mechanics, postural dysfunction, and pain.   ACTIVITY LIMITATIONS: carrying, lifting, standing, squatting, and stairs  PARTICIPATION LIMITATIONS: driving, shopping, and community activity  PERSONAL FACTORS: Age, Past/current experiences, and 1 comorbidity: osteoporosis  are also affecting patient's functional outcome.   REHAB POTENTIAL: Good  CLINICAL DECISION MAKING: Evolving/moderate complexity  EVALUATION COMPLEXITY: Moderate   GOALS: Goals reviewed with patient? Yes  SHORT TERM GOALS: Target date: 09/19/2022   Pt to be IND with initial HEP for therapeutic progression Baseline: Goal status: INITIAL  2.  Pt to report max pain to </= 5/10 to demo improving condition Baseline:  Goal status: INITIAL  3.  Pt to demonstrate efficient gait pattern with report of </= 5/10 pain to reduce potential proximal kinetic chain problems Baseline:  Goal status: INITIAL  4.  Pt to increase baseline FOTO score to >/= 56%  Baseline:  Goal status: INITIAL  5.  Reduce ankle swelling by >/= 1 cm to promote ankle mobility Baseline:  Goal status: INITIAL  LONG TERM GOALS: Target date: 10/17/2022  Increase R ankle DF to >/= 8 degrees with no report of pain during assessment to assist with toe clearance with gait.  Baseline:  Goal status: INITIAL  2.  Increase R ankle gross strength to >/= 4/5 with </=2.10 max pain during testing to demo improvement in ankle strength/ stability.  Baseline:  Goal status: INITIAL  3.  Increase FOTO score to >/= 61% to demo improvement in function Baseline:  Goal status: INITIAL  4.  Pt to report improvement in descending steps and heavy duty house chores/ ADLS with max pain of </= 2/10 pain Baseline:  Goal status: INITIAL  5.  Pt be able  to peform 6 min walk test achieve appropriate age specific distance of ~1545 ft with </= 2/10 max pain Baseline:  Goal status: INITIAL   PLAN:  PT FREQUENCY: 1-2x/week  PT DURATION: 8 weeks  PLANNED INTERVENTIONS: Therapeutic exercises, Therapeutic activity, Neuromuscular re-education, Balance training, Gait training, Patient/Family education, Self Care, Joint mobilization, Joint manipulation, Stair training, Aquatic Therapy, Dry Needling, Cryotherapy, Moist heat, Taping, Vasopneumatic device, Ultrasound, Ionotophoresis '4mg'$ /ml Dexamethasone, Manual therapy, and Re-evaluation  PLAN FOR NEXT SESSION: review/ update HEP PRN. Response to TPDN for the R calf continue PRN, ankle strengthening both intrinsic/ extrinsic, gait training.   Voncille Lo, PT, [place] Certified Exercise Expert for the Aging Adult  08/30/22 10:45 AM Phone: [phone] Fax: [phone]

## 2022-08-31 NOTE — Therapy (Signed)
OUTPATIENT PHYSICAL THERAPY LOWER EXTREMITY TREATMENT NOTE   Patient Name: Mary Garcia MRN: [id number] DOB:08/07/1946, 77 y.o., female Today's Date: 09/01/2022  END OF SESSION:  PT End of Session - 09/01/22 0941     Visit Number 3    Number of Visits 16    Date for PT Re-Evaluation 10/17/22    Authorization Type [employer]    PT Start Time [phone]    PT Stop Time 1015    PT Time Calculation (min) 38 min    Activity Tolerance Patient tolerated treatment well;Patient limited by pain    Behavior During Therapy WFL for tasks assessed/performed              Past Medical History:  Diagnosis Date   Anxiety    Arthritis    Asthma    Bronchitis, acute    Past Surgical History:  Procedure Laterality Date   BREAST ENHANCEMENT SURGERY     CATARACT EXTRACTION W/ INTRAOCULAR LENS IMPLANT Bilateral    FACELIFT     local   ROTATOR CUFF REPAIR Right    TIBIA FRACTURE SURGERY  2004   left leg x3, tibia and fibula   TOTAL KNEE ARTHROPLASTY Left 10/18/2021   Procedure: TOTAL KNEE ARTHROPLASTY;  Surgeon: Vickey Huger, MD;  Location: [facility];  Service: Orthopedics;  Laterality: Left;   TUMOR EXCISION  2003   right muscle back, benign   Patient Active Problem List   Diagnosis Date Noted   Seasonal allergic rhinitis due to pollen 05/17/2021   Mild persistent asthma without complication 11/91/4782   DDD (degenerative disc disease), cervical 09/15/2017   Depression 02/02/2016   Arthropathy of right shoulder 02/02/2016   Insomnia 04/13/2011   Osteoporosis 04/13/2011   Biliary dyskinesia 04/13/2011   Osteoarthritis 04/13/2011   History of migraine headaches 04/13/2011   Vitamin D deficiency 04/13/2011   Anxiety 04/13/2011   Irritable bowel syndrome 04/13/2011    PCP: Elby Showers, MD   REFERRING PROVIDER: Armond Hang, MD   REFERRING DIAG:  Achilles tendinitis, right leg [M76.61]   THERAPY DIAG:  Pain in right ankle and joints of right foot  Muscle weakness  (generalized)  Localized edema  Difficulty in walking, not elsewhere classified  Chronic pain of left knee  Rationale for Evaluation and Treatment: Rehabilitation  ONSET DATE: Started 02/11/22 and progressively worsened  SUBJECTIVE:   SUBJECTIVE STATEMENT:  My husband had to have his nostrils cauterized for a bleeder. At the ED after I left PT the other day.  I am about a 2/10 today At worst a 5/10   Eval- This reported after moving on 02/11/22 which she attributes to doing a lot of moving boxes and going up / down steps. She reports PT previously felt like it didn't help as much but also reports she felt she didn't take it easy as prescribed by her providers. She reports she continued pain that occurs throughout the day. She reports the pain is better in the AM but reports after being up for a few hours then the pain starts progressively worsens.   PERTINENT HISTORY:  L  TKA 10-18-21 ,Osteoporosis PAIN:  Are you having pain? Yes: NPRS scale:  at best 0/10 at worst 7/10 Pain location: back of the ankle Pain description: burning, aching, sore, sharp / shoot.  Aggravating factors: any standing/ walking, descending steps Relieving factors: sitting/ resting  PRECAUTIONS: None  WEIGHT BEARING RESTRICTIONS: No  FALLS:  Has patient fallen in last 6 months? No  LIVING ENVIRONMENT:  Lives with: lives with their spouse Lives in: House/apartment Stairs: Yes: Internal: 12 steps; on left going up and External: 2 steps; none Has following equipment at home:  [facility]   OCCUPATION: Retired  PLOF: Independent  PATIENT GOALS: stop having so much pain  NEXT MD VISIT:   OBJECTIVE:   DIAGNOSTIC FINDINGS: MRI at MD's office which pt provided report for Impression of r ankle MRI 07/11/2022 Severe distal achilles tendinosis with high-grade partial tearing of the tendon at its calcaneal insertion. No significant associated tendon retraction Mild to moderate retrocalcanel bursitis with associated  with haglund defomrityof the posterior calcaneus.  Nondisplaced split tearing of the peroneus longus with retromalleolar groove and peroneal brevis near the level of the peroneal tubercle Subacute/chronic partial tearing of the ATFL. Additional suspected chronic partial tearing/degenerationg of the superomedial of the calcanavicular spring ligament.  PATIENT SURVEYS:  FOTO 47% and predicted 61%  COGNITION: Overall cognitive status: Within functional limits for tasks assessed     SENSATION: WFL  EDEMA:  Figure 8: R 49 cm, 47.cm  POSTURE: rounded shoulders, forward head, and anterior pelvic tilt WNL   PALPATION: TTP along the posterior cancanela tubercle with associated swelling noted as a result of haglunds deformity. Multiple trigger points  in the R calf with specific soreness along the medial soleus.  LOWER EXTREMITY ROM:  Active/ passive ROM Right eval Left eval  Hip flexion    Hip extension    Hip abduction    Hip adduction    Hip internal rotation    Hip external rotation    Knee flexion    Knee extension    Ankle dorsiflexion A2/10P A9  Ankle plantarflexion 68 51  Ankle inversion [facility] WFL  Ankle eversion WFL WFL   (Blank rows = not tested)  LOWER EXTREMITY MMT:  MMT Right eval Left eval  Hip flexion    Hip extension    Hip abduction    Hip adduction    Hip internal rotation    Hip external rotation    Knee flexion    Knee extension    Ankle dorsiflexion 5+ 5+  Ankle plantarflexion 3+ P! 5+4  Ankle inversion 4+ 4+  Ankle eversion 4+ 4+  Posterior tibalis R 3=/5 P!    L 5 Peroneal brevis/ longus (4- with P!)  L 5   (Blank rows = not tested)  LOWER EXTREMITY SPECIAL TESTS:  N/A  FUNCTIONAL TESTS:  6 min walk test - performed next visit.   GAIT: Distance walked: 120 ft to tx room Assistive device utilized: None Level of assistance: Complete Independence Comments: decreased stride on RLE with antalgic pattern noted, with decreased toe off  compared bil   TODAY'S TREATMENT:   [facility] Adult PT Treatment:                                                DATE: 09-01-22 Therapeutic Exercise: Gastroc Stretch on Wall  2 reps - 30 sec hold Seated Calf Raise with Weights RT Thighs On Step  with 10 lb 3 sets - 10 reps Heel raise BIL 2 x 15 Woodpeckers One Leg Anterior  RT 3 x 10 Woodpecker one Leg medial RT 3 x 10 Woodpecker one leg lateral RT 3 x 10 STS with 20# weight 3 x 10 Manual Therapy: Pt Acupuncture yesterday at another practitioner and had RT medial  calf proximal bruising which was avoided today by PT DTW of gastroc/soleus  Trigger Point Dry-Needling  Treatment instructions: Expect mild to moderate muscle soreness. S/S of pneumothorax if dry needled over a lung field, and to seek immediate medical attention should they occur. Patient verbalized understanding of these instructions and education.  Patient Consent Given: Yes Education handout provided: Yes Muscles treated: RT gastroc/ soleus Electrical stimulation performed: No Parameters: N/A Treatment response/outcome: twitch response  Modalities  Ice pack on RT gastroc/achilles  [facility] Adult PT Treatment:                                                DATE: 08-30-22 Therapeutic Exercise: Seated Calf Stretch with Strap 2 reps - 30 sec hold Gastroc Stretch on Wall  2 reps - 30 sec hold Ankle Plantar flexion 2 sets - 10 reps Woodpeckers One Leg Anterior  RT 3 x 10 Woodpecker one Leg medial RT 3 x 10 Woodpecker one leg lateral RT 3 x 10 Seated Calf Raise with Weights on Thighs On Step  with 10 lb 3 sets - 10 reps Seated Ankle Eversion with GTB 3 sets - 10 reps Seated Figure 4 Ankle Inversion with GTB 3 x 10 SELF CARE-  Ice massage education for pt to do at home using paper cup.  PT simulated using water bottle at clinic and educated on technique and execution with return demo                                                                                                                           [facility] Adult PT Treatment:                                                DATE: EVALUATION Therapeutic Exercise: Seated calf stretch with strap 2 x 30 sec Bil ankle PF with RLE eccentric loading x 3-5 seconds 1 x 10 progressed to single leg 1 x 10 with cues to keep knee straight Manual Therapy: IASTM along the R gastro/ soleus Active release of gastroc/soleus   Trigger Point Dry-Needling  Treatment instructions: Expect mild to moderate muscle soreness. S/S of pneumothorax if dry needled over a lung field, and to seek immediate medical attention should they occur. Patient verbalized understanding of these instructions and education.  Patient Consent Given: Yes Education handout provided: Yes Muscles treated: R gastroc/ soleus Electrical stimulation performed: No Parameters: N/A Treatment response/outcome: twitch response    PATIENT EDUCATION:  Education details: Evaluation findings, POC, Goals, HEP with proper form/ rationale.  Person educated: Patient Education method: Explanation, Verbal cues, and Handouts Education comprehension: verbalized understanding  HOME EXERCISE PROGRAM: Access Code: [email or web address] URL: [email or web address] Date: 08/30/2022 Prepared by: Donnetta Simpers  An Schnabel  Exercises - Seated Calf Stretch with Strap  - 3 x daily - 7 x weekly - 2 sets - 2 reps - 30 sec hold - Gastroc Stretch on Wall  - 3 x daily - 7 x weekly - 2 sets - 2 reps - 30 sec hold - Ankle Plantar flexion  - 1 x daily - 2 sets - 10 reps - Woodpeckers One Leg Every OTHER day  - 1 x daily - 7 x weekly - 2-3 sets - 10 reps - Seated Calf Raise with Weights on Thighs On Step  - 1 x daily - 7 x weekly - 3 sets - 10 reps - Seated Ankle Eversion with Resistance  - 1 x daily - 7 x weekly - 3 sets - 10 reps - Seated Figure 4 Ankle Inversion with Resistance  - 1 x daily - 7 x weekly - 3 sets - 10 reps  ASSESSMENT:  CLINICAL IMPRESSION: Pt returns for 3rd visit to clinic. Pt  entered clinic and consented to [facility] for gastroc.  Pt saw acupuncturist and left medial proximal gastroc bruising. PT avoided bruised area and performed TPDN as pt request.  Ms Seabolt has received much benefit from [facility] for pain relief.   Pt session concentrated on  reinforcing new exercises on HEP   Will continue with POC.  EVAL- Patient is a 77 y.o. F who was seen today for physical therapy evaluation and treatment for R ankle / achille pain.  Pt reports hx of R ankle pain that has been present since she moved homes in 02/11/22 and has progressively worsened. TTP along the posterior calcaneal tubercle and distance achilles as well as distal peroneals and bil gastroc/ soleus with trigger points noted. She demonstrates limited ankle DF and weakness with MMT of peroneals and gastroc/ soleus secondary to pain. She demonstrates an antalgic gait pattern with limited stride / toe off on the R compared bil. Reviewed TPDN and consent was provied for the R gastroc/solues followed with IASTM / active release techniques. Reviewed stretching and HEP which she noted improvement in tension. She would benefit from physical therapy to decrease R ankle pain, improve DF ROM, increase gross strength/ stability and maximize gait efficency as well as overall function by addressing the deficits listed.   OBJECTIVE IMPAIRMENTS: Abnormal gait, decreased activity tolerance, decreased balance, decreased endurance, decreased strength, increased edema, increased fascial restrictions, increased muscle spasms, improper body mechanics, postural dysfunction, and pain.   ACTIVITY LIMITATIONS: carrying, lifting, standing, squatting, and stairs  PARTICIPATION LIMITATIONS: driving, shopping, and community activity  PERSONAL FACTORS: Age, Past/current experiences, and 1 comorbidity: osteoporosis  are also affecting patient's functional outcome.   REHAB POTENTIAL: Good  CLINICAL DECISION MAKING: Evolving/moderate complexity  EVALUATION  COMPLEXITY: Moderate   GOALS: Goals reviewed with patient? Yes  SHORT TERM GOALS: Target date: 09/19/2022   Pt to be IND with initial HEP for therapeutic progression Baseline: Goal status: ONGOING  2.  Pt to report max pain to </= 5/10 to demo improving condition Baseline:  Goal status: ONGOING  3.  Pt to demonstrate efficient gait pattern with report of </= 5/10 pain to reduce potential proximal kinetic chain problems Baseline:  Goal status: ONGOING  4.  Pt to increase baseline FOTO score to >/= 56%  Baseline:  Goal status: ONGOING  5.  Reduce ankle swelling by >/= 1 cm to promote ankle mobility Baseline:  Goal status: INITIAL  LONG TERM GOALS: Target date: 10/17/2022  Increase R ankle DF to >/=  8 degrees with no report of pain during assessment to assist with toe clearance with gait.  Baseline:  Goal status: INITIAL  2.  Increase R ankle gross strength to >/= 4/5 with </=2.10 max pain during testing to demo improvement in ankle strength/ stability.  Baseline:  Goal status: INITIAL  3.  Increase FOTO score to >/= 61% to demo improvement in function Baseline:  Goal status: INITIAL  4.  Pt to report improvement in descending steps and heavy duty house chores/ ADLS with max pain of </= 2/10 pain Baseline:  Goal status: INITIAL  5.  Pt be able to peform 6 min walk test achieve appropriate age specific distance of ~1545 ft with </= 2/10 max pain Baseline:  Goal status: INITIAL   PLAN:  PT FREQUENCY: 1-2x/week  PT DURATION: 8 weeks  PLANNED INTERVENTIONS: Therapeutic exercises, Therapeutic activity, Neuromuscular re-education, Balance training, Gait training, Patient/Family education, Self Care, Joint mobilization, Joint manipulation, Stair training, Aquatic Therapy, Dry Needling, Cryotherapy, Moist heat, Taping, Vasopneumatic device, Ultrasound, Ionotophoresis '4mg'$ /ml Dexamethasone, Manual therapy, and Re-evaluation  PLAN FOR NEXT SESSION: review/ update HEP PRN.  Response to TPDN for the R calf continue PRN, ankle strengthening both intrinsic/ extrinsic, gait training.   Voncille Lo, PT, [place] Certified Exercise Expert for the Aging Adult  09/01/22 12:45 PM Phone: [phone] Fax: [phone]

## 2022-09-01 ENCOUNTER — Encounter: Payer: Self-pay | Admitting: Physical Therapy

## 2022-09-01 ENCOUNTER — Ambulatory Visit: Payer: Medicare Other | Admitting: Physical Therapy

## 2022-09-01 DIAGNOSIS — R600 Localized edema: Secondary | ICD-10-CM

## 2022-09-01 DIAGNOSIS — M25571 Pain in right ankle and joints of right foot: Secondary | ICD-10-CM | POA: Diagnosis not present

## 2022-09-01 DIAGNOSIS — R262 Difficulty in walking, not elsewhere classified: Secondary | ICD-10-CM

## 2022-09-01 DIAGNOSIS — M6281 Muscle weakness (generalized): Secondary | ICD-10-CM | POA: Diagnosis not present

## 2022-09-01 DIAGNOSIS — G8929 Other chronic pain: Secondary | ICD-10-CM

## 2022-09-01 DIAGNOSIS — M25562 Pain in left knee: Secondary | ICD-10-CM | POA: Diagnosis not present

## 2022-09-01 NOTE — Therapy (Signed)
OUTPATIENT PHYSICAL THERAPY LOWER EXTREMITY TREATMENT NOTE   Patient Name: Mary Garcia MRN: [id number] DOB:Mar 02, 1946, 77 y.o., female Today's Date: 09/06/2022  END OF SESSION:  PT End of Session - 09/06/22 0852     Visit Number 4    Number of Visits 16    Date for PT Re-Evaluation 10/17/22    Authorization Type BC[employer]    PT Start Time [id number]    PT Stop Time 0930    PT Time Calculation (min) 40 min    Activity Tolerance Patient tolerated treatment well;Patient limited by pain    Behavior During Therapy [facility] for tasks assessed/performed              Past Medical History:  Diagnosis Date   Anxiety    Arthritis    Asthma    Bronchitis, acute    Past Surgical History:  Procedure Laterality Date   BREAST ENHANCEMENT SURGERY     CATARACT EXTRACTION W/ INTRAOCULAR LENS IMPLANT Bilateral    FACELIFT     local   ROTATOR CUFF REPAIR Right    TIBIA FRACTURE SURGERY  2004   left leg x3, tibia and fibula   TOTAL KNEE ARTHROPLASTY Left 10/18/2021   Procedure: TOTAL KNEE ARTHROPLASTY;  Surgeon: Vickey Huger, MD;  Location: [facility];  Service: Orthopedics;  Laterality: Left;   TUMOR EXCISION  2003   right muscle back, benign   Patient Active Problem List   Diagnosis Date Noted   Seasonal allergic rhinitis due to pollen 05/17/2021   Mild persistent asthma without complication 48/54/6270   DDD (degenerative disc disease), cervical 09/15/2017   Depression 02/02/2016   Arthropathy of right shoulder 02/02/2016   Insomnia 04/13/2011   Osteoporosis 04/13/2011   Biliary dyskinesia 04/13/2011   Osteoarthritis 04/13/2011   History of migraine headaches 04/13/2011   Vitamin D deficiency 04/13/2011   Anxiety 04/13/2011   Irritable bowel syndrome 04/13/2011    PCP: Elby Showers, MD   REFERRING PROVIDER: Armond Hang, MD   REFERRING DIAG:  Achilles tendinitis, right leg [M76.61]   THERAPY DIAG:  Pain in right ankle and joints of right foot  Muscle weakness  (generalized)  Difficulty in walking, not elsewhere classified  Chronic pain of left knee  Rationale for Evaluation and Treatment: Rehabilitation  ONSET DATE: Started 02/11/22 and progressively worsened  SUBJECTIVE:   SUBJECTIVE STATEMENT:  I am a 1-2 today. I really think the TPDN really works. I am walking about 1/4 to 1/2 mile. And going up steps alternating steps even coming down steps.  I have pain with sustaining my foot pedal on the gas accelerator for 15 min.    Eval- This reported after moving on 02/11/22 which she attributes to doing a lot of moving boxes and going up / down steps. She reports PT previously felt like it didn't help as much but also reports she felt she didn't take it easy as prescribed by her providers. She reports she continued pain that occurs throughout the day. She reports the pain is better in the AM but reports after being up for a few hours then the pain starts progressively worsens.   PERTINENT HISTORY:  L  TKA 10-18-21 ,Osteoporosis PAIN: 09-06-22  1-2/10 Are you having pain? Yes: NPRS scale:  at best 0/10 at worst 7/10 Pain location: back of the ankle Pain description: burning, aching, sore, sharp / shoot.  Aggravating factors: any standing/ walking, descending steps Relieving factors: sitting/ resting  PRECAUTIONS: None  WEIGHT BEARING RESTRICTIONS: No  FALLS:  Has patient fallen in last 6 months? No  LIVING ENVIRONMENT: Lives with: lives with their spouse Lives in: House/apartment Stairs: Yes: Internal: 12 steps; on left going up and External: 2 steps; none Has following equipment at home:  [place]: Retired  PLOF: Independent  PATIENT GOALS: stop having so much pain  NEXT MD VISIT:   OBJECTIVE:   DIAGNOSTIC FINDINGS: MRI at MD's office which pt provided report for Impression of r ankle MRI 07/11/2022 Severe distal achilles tendinosis with high-grade partial tearing of the tendon at its calcaneal insertion. No significant  associated tendon retraction Mild to moderate retrocalcanel bursitis with associated with haglund defomrityof the posterior calcaneus.  Nondisplaced split tearing of the peroneus longus with retromalleolar groove and peroneal brevis near the level of the peroneal tubercle Subacute/chronic partial tearing of the ATFL. Additional suspected chronic partial tearing/degenerationg of the superomedial of the calcanavicular spring ligament.  PATIENT SURVEYS:  FOTO 47% and predicted 61%  COGNITION: Overall cognitive status: Within functional limits for tasks assessed     SENSATION: WFL  EDEMA:  Figure 8: R 49 cm, 47.cm   1-23- 24  RT 45cm, LT 45.25 cm  POSTURE: rounded shoulders, forward head, and anterior pelvic tilt WNL   PALPATION: TTP along the posterior cancanela tubercle with associated swelling noted as a result of haglunds deformity. Multiple trigger points  in the R calf with specific soreness along the medial soleus.  LOWER EXTREMITY ROM:  Active/ passive ROM Right eval Left eval  Hip flexion    Hip extension    Hip abduction    Hip adduction    Hip internal rotation    Hip external rotation    Knee flexion    Knee extension    Ankle dorsiflexion A2/10P A9  Ankle plantarflexion 68 51  Ankle inversion [facility] WFL  Ankle eversion WFL WFL   (Blank rows = not tested)  LOWER EXTREMITY MMT:  MMT Right eval Left eval  Hip flexion    Hip extension    Hip abduction    Hip adduction    Hip internal rotation    Hip external rotation    Knee flexion    Knee extension    Ankle dorsiflexion 5+ 5+  Ankle plantarflexion 3+ P! 5+4  Ankle inversion 4+ 4+  Ankle eversion 4+ 4+  Posterior tibalis R 3=/5 P!    L 5 Peroneal brevis/ longus (4- with P!)  L 5   (Blank rows = not tested)  LOWER EXTREMITY SPECIAL TESTS:  N/A  FUNCTIONAL TESTS:  6 min walk test - performed next visit.   GAIT: Distance walked: 120 ft to tx room Assistive device utilized: None Level of  assistance: Complete Independence Comments: decreased stride on RLE with antalgic pattern noted, with decreased toe off compared bil   TODAY'S TREATMENT:   [facility] Adult PT Treatment:                                                DATE: 09-06-22 Therapeutic Exercise:  Seated Calf Raise with Weights RT Thighs On Step  with 15 lb 3 sets - 10 reps RDL with 15 # 2 x 15 3 rounds of next  2  exercises Deadlift with 45 # KB 1 x 5 Heel raise BIL 1 x 15 Gastroc Stretch on Wall  2 reps -  30 sec hold Woodpeckers One Leg Anterior  RT 3 x 10 Woodpecker one Leg medial RT 3 x 10 Woodpecker one leg lateral RT 3 x 10 STS with 20# weight 3 x 10 Manual Therapy: Pt Acupuncture yesterday at another practitioner and had RT medial calf proximal bruising which was avoided today by PT DTW of gastroc/soleus  Trigger Point Dry-Needling  Treatment instructions: Expect mild to moderate muscle soreness. S/S of pneumothorax if dry needled over a lung field, and to seek immediate medical attention should they occur. Patient verbalized understanding of these instructions and education.  Patient Consent Given: Yes Education handout provided: Yes Muscles treated: RT gastroc/ soleus especially medial side Electrical stimulation performed: No Parameters: N/A Treatment response/outcome: twitch response . Palpable lengthening Modalities  Ice pack on RT gastroc/achilles [facility] Adult PT Treatment:                                                DATE: 09-01-22 Therapeutic Exercise: Gastroc Stretch on Wall  2 reps - 30 sec hold Seated Calf Raise with Weights RT Thighs On Step  with 10 lb 3 sets - 10 reps Heel raise BIL 2 x 15 Woodpeckers One Leg Anterior  RT 3 x 10 Woodpecker one Leg medial RT 3 x 10 Woodpecker one leg lateral RT 3 x 10 Single leg stand to sit  1 x 10 mat elevated Manual Therapy: DTW of gastroc/soleus concentrating on medial RT gastroc/soleus Trigger Point Dry-Needling  Treatment instructions: Expect mild  to moderate muscle soreness. S/S of pneumothorax if dry needled over a lung field, and to seek immediate medical attention should they occur. Patient verbalized understanding of these instructions and education.  Patient Consent Given: Yes Education handout provided: Yes Muscles treated: RT gastroc/ soleus Electrical stimulation performed: No Parameters: N/A Treatment response/outcome: twitch response  Modalities  Ice pack on RT gastroc/achilles  [facility] Adult PT Treatment:                                                DATE: 08-30-22 Therapeutic Exercise: Seated Calf Stretch with Strap 2 reps - 30 sec hold Gastroc Stretch on Wall  2 reps - 30 sec hold Ankle Plantar flexion 2 sets - 10 reps Woodpeckers One Leg Anterior  RT 3 x 10 Woodpecker one Leg medial RT 3 x 10 Woodpecker one leg lateral RT 3 x 10 Seated Calf Raise with Weights on Thighs On Step  with 10 lb 3 sets - 10 reps Seated Ankle Eversion with GTB 3 sets - 10 reps Seated Figure 4 Ankle Inversion with GTB 3 x 10 SELF CARE-  Ice massage education for pt to do at home using paper cup.  PT simulated using water bottle at clinic and educated on technique and execution with return demo  [facility] Adult PT Treatment:                                                DATE: EVALUATION Therapeutic Exercise: Seated calf stretch with strap 2 x 30 sec Bil ankle PF with RLE eccentric loading x 3-5 seconds 1 x 10 progressed to single leg 1 x 10 with cues to keep knee straight Manual Therapy: IASTM along the R gastro/ soleus Active release of gastroc/soleus   Trigger Point Dry-Needling  Treatment instructions: Expect mild to moderate muscle soreness. S/S of pneumothorax if dry needled over a lung field, and to seek immediate medical attention should they occur. Patient verbalized understanding of these instructions and  education.  Patient Consent Given: Yes Education handout provided: Yes Muscles treated: R gastroc/ soleus Electrical stimulation performed: No Parameters: N/A Treatment response/outcome: twitch response    PATIENT EDUCATION:  Education details: Evaluation findings, POC, Goals, HEP with proper form/ rationale.  Person educated: Patient Education method: Explanation, Verbal cues, and Handouts Education comprehension: verbalized understanding  HOME EXERCISE PROGRAM: Access Code: [email or web address] URL: [email or web address] Date: 08/30/2022 Prepared by: Voncille Lo  Exercises - Seated Calf Stretch with Strap  - 3 x daily - 7 x weekly - 2 sets - 2 reps - 30 sec hold - Gastroc Stretch on Wall  - 3 x daily - 7 x weekly - 2 sets - 2 reps - 30 sec hold - Ankle Plantar flexion  - 1 x daily - 2 sets - 10 reps - Woodpeckers One Leg Every OTHER day  - 1 x daily - 7 x weekly - 2-3 sets - 10 reps - Seated Calf Raise with Weights on Thighs On Step  - 1 x daily - 7 x weekly - 3 sets - 10 reps - Seated Ankle Eversion with Resistance  - 1 x daily - 7 x weekly - 3 sets - 10 reps - Seated Figure 4 Ankle Inversion with Resistance  - 1 x daily - 7 x weekly - 3 sets - 10 reps  ASSESSMENT:  CLINICAL IMPRESSION: Pt returns for 4th visit to clinic. Pt entered clinic and consented to [facility] for gastroc especially on medial side with twitch responses and palpable lengthening. Session concentrating on LE functional strength using deadlifts and LE exercise for proximal hips and foot intrinsics. Initially with pain with heel raise but decreased with subsequent exercise.  Ms Tschetter has received much benefit from [facility] for pain relief. STG all MET except FOTO score # 4 gait goal # 3 partially met. Pt reports able to negotiate steps up and down with alternating stepping. Will continue with POC.  EVAL- Patient is a 77 y.o. F who was seen today for physical therapy evaluation and treatment for R ankle / achille  pain.  Pt reports hx of R ankle pain that has been present since she moved homes in 02/11/22 and has progressively worsened. TTP along the posterior calcaneal tubercle and distance achilles as well as distal peroneals and bil gastroc/ soleus with trigger points noted. She demonstrates limited ankle DF and weakness with MMT of peroneals and gastroc/ soleus secondary to pain. She demonstrates an antalgic gait pattern with limited stride / toe off on the R compared bil. Reviewed TPDN and consent was provied for the R gastroc/solues followed with IASTM / active release techniques. Reviewed stretching and HEP which she noted  improvement in tension. She would benefit from physical therapy to decrease R ankle pain, improve DF ROM, increase gross strength/ stability and maximize gait efficency as well as overall function by addressing the deficits listed.   OBJECTIVE IMPAIRMENTS: Abnormal gait, decreased activity tolerance, decreased balance, decreased endurance, decreased strength, increased edema, increased fascial restrictions, increased muscle spasms, improper body mechanics, postural dysfunction, and pain.   ACTIVITY LIMITATIONS: carrying, lifting, standing, squatting, and stairs  PARTICIPATION LIMITATIONS: driving, shopping, and community activity  PERSONAL FACTORS: Age, Past/current experiences, and 1 comorbidity: osteoporosis  are also affecting patient's functional outcome.   REHAB POTENTIAL: Good  CLINICAL DECISION MAKING: Evolving/moderate complexity  EVALUATION COMPLEXITY: Moderate   GOALS: Goals reviewed with patient? Yes  SHORT TERM GOALS: Target date: 09/19/2022   Pt to be IND with initial HEP for therapeutic progression Baseline: Goal status: MET  2.  Pt to report max pain to </= 5/10 to demo improving condition Baseline: 09-06-22  at worst 4/10 at night Goal status: MET  3.  Pt to demonstrate efficient gait pattern with report of </= 5/10 pain to reduce potential proximal kinetic  chain problems Baseline: 09-06-22  has slight antalgic gait but now negotiating steps up and down with alternating pattern Goal status: PARTIALLY MET  4.  Pt to increase baseline FOTO score to >/= 56%  Baseline:  Goal status: ONGOING  5.  Reduce ankle swelling by >/= 1 cm to promote ankle mobility Baseline: RT 49, LT 47  09-06-22 RT 45cm, LT 45.25 cm Goal status:MET  LONG TERM GOALS: Target date: 10/17/2022  Increase R ankle DF to >/= 8 degrees with no report of pain during assessment to assist with toe clearance with gait.  Baseline:  Goal status: INITIAL  2.  Increase R ankle gross strength to >/= 4/5 with </=2.10 max pain during testing to demo improvement in ankle strength/ stability.  Baseline:  Goal status: INITIAL  3.  Increase FOTO score to >/= 61% to demo improvement in function Baseline:  Goal status: INITIAL  4.  Pt to report improvement in descending steps and heavy duty house chores/ ADLS with max pain of </= 2/10 pain Baseline:  Goal status: INITIAL  5.  Pt be able to peform 6 min walk test achieve appropriate age specific distance of ~1545 ft with </= 2/10 max pain Baseline:  Goal status: INITIAL   PLAN:  PT FREQUENCY: 1-2x/week  PT DURATION: 8 weeks  PLANNED INTERVENTIONS: Therapeutic exercises, Therapeutic activity, Neuromuscular re-education, Balance training, Gait training, Patient/Family education, Self Care, Joint mobilization, Joint manipulation, Stair training, Aquatic Therapy, Dry Needling, Cryotherapy, Moist heat, Taping, Vasopneumatic device, Ultrasound, Ionotophoresis '4mg'$ /ml Dexamethasone, Manual therapy, and Re-evaluation  PLAN FOR NEXT SESSION: review/ update HEP PRN. Response to TPDN for the R calf continue PRN, ankle strengthening both intrinsic/ extrinsic, gait training.   Voncille Lo, PT, [place] Certified Exercise Expert for the Aging Adult  09/06/22 9:32 AM Phone: [phone] Fax: [phone]

## 2022-09-06 ENCOUNTER — Encounter: Payer: Self-pay | Admitting: Physical Therapy

## 2022-09-06 ENCOUNTER — Ambulatory Visit: Payer: Medicare Other | Admitting: Physical Therapy

## 2022-09-06 DIAGNOSIS — R600 Localized edema: Secondary | ICD-10-CM | POA: Diagnosis not present

## 2022-09-06 DIAGNOSIS — G8929 Other chronic pain: Secondary | ICD-10-CM | POA: Diagnosis not present

## 2022-09-06 DIAGNOSIS — M6281 Muscle weakness (generalized): Secondary | ICD-10-CM | POA: Diagnosis not present

## 2022-09-06 DIAGNOSIS — M25571 Pain in right ankle and joints of right foot: Secondary | ICD-10-CM | POA: Diagnosis not present

## 2022-09-06 DIAGNOSIS — R262 Difficulty in walking, not elsewhere classified: Secondary | ICD-10-CM | POA: Diagnosis not present

## 2022-09-06 DIAGNOSIS — M25562 Pain in left knee: Secondary | ICD-10-CM | POA: Diagnosis not present

## 2022-09-07 ENCOUNTER — Encounter: Payer: Self-pay | Admitting: Internal Medicine

## 2022-09-07 DIAGNOSIS — Z1231 Encounter for screening mammogram for malignant neoplasm of breast: Secondary | ICD-10-CM | POA: Diagnosis not present

## 2022-09-07 LAB — HM MAMMOGRAPHY

## 2022-09-07 NOTE — Therapy (Incomplete)
OUTPATIENT PHYSICAL THERAPY LOWER EXTREMITY TREATMENT NOTE   Patient Name: Mary Garcia MRN: [id number] DOB:1946-04-07, 77 y.o., female Today's Date: 09/07/2022  END OF SESSION:     Past Medical History:  Diagnosis Date   Anxiety    Arthritis    Asthma    Bronchitis, acute    Past Surgical History:  Procedure Laterality Date   BREAST ENHANCEMENT SURGERY     CATARACT EXTRACTION W/ INTRAOCULAR LENS IMPLANT Bilateral    FACELIFT     local   ROTATOR CUFF REPAIR Right    TIBIA FRACTURE SURGERY  2004   left leg x3, tibia and fibula   TOTAL KNEE ARTHROPLASTY Left 10/18/2021   Procedure: TOTAL KNEE ARTHROPLASTY;  Surgeon: Vickey Huger, MD;  Location: [facility];  Service: Orthopedics;  Laterality: Left;   TUMOR EXCISION  2003   right muscle back, benign   Patient Active Problem List   Diagnosis Date Noted   Seasonal allergic rhinitis due to pollen 05/17/2021   Mild persistent asthma without complication 19/50/9326   DDD (degenerative disc disease), cervical 09/15/2017   Depression 02/02/2016   Arthropathy of right shoulder 02/02/2016   Insomnia 04/13/2011   Osteoporosis 04/13/2011   Biliary dyskinesia 04/13/2011   Osteoarthritis 04/13/2011   History of migraine headaches 04/13/2011   Vitamin D deficiency 04/13/2011   Anxiety 04/13/2011   Irritable bowel syndrome 04/13/2011    PCP: Elby Showers, MD   REFERRING PROVIDER: Armond Hang, MD   REFERRING DIAG:  Achilles tendinitis, right leg [M76.61]   THERAPY DIAG:  No diagnosis found.  Rationale for Evaluation and Treatment: Rehabilitation  ONSET DATE: Started 02/11/22 and progressively worsened  SUBJECTIVE:   SUBJECTIVE STATEMENT:  I am a 1-2 today. I really think the TPDN really works. I am walking about 1/4 to 1/2 mile. And going up steps alternating steps even coming down steps.  I have pain with sustaining my foot pedal on the gas accelerator for 15 min.    Eval- This reported after moving on 02/11/22 which  she attributes to doing a lot of moving boxes and going up / down steps. She reports PT previously felt like it didn't help as much but also reports she felt she didn't take it easy as prescribed by her providers. She reports she continued pain that occurs throughout the day. She reports the pain is better in the AM but reports after being up for a few hours then the pain starts progressively worsens.   PERTINENT HISTORY:  L  TKA 10-18-21 ,Osteoporosis PAIN: 09-06-22  1-2/10 Are you having pain? Yes: NPRS scale:  at best 0/10 at worst 7/10 Pain location: back of the ankle Pain description: burning, aching, sore, sharp / shoot.  Aggravating factors: any standing/ walking, descending steps Relieving factors: sitting/ resting  PRECAUTIONS: None  WEIGHT BEARING RESTRICTIONS: No  FALLS:  Has patient fallen in last 6 months? No  LIVING ENVIRONMENT: Lives with: lives with their spouse Lives in: House/apartment Stairs: Yes: Internal: 12 steps; on left going up and External: 2 steps; none Has following equipment at home:  [place]: Retired  PLOF: Independent  PATIENT GOALS: stop having so much pain  NEXT MD VISIT:   OBJECTIVE:   DIAGNOSTIC FINDINGS: MRI at MD's office which pt provided report for Impression of r ankle MRI 07/11/2022 Severe distal achilles tendinosis with high-grade partial tearing of the tendon at its calcaneal insertion. No significant associated tendon retraction Mild to moderate retrocalcanel bursitis with associated with  haglund defomrityof the posterior calcaneus.  Nondisplaced split tearing of the peroneus longus with retromalleolar groove and peroneal brevis near the level of the peroneal tubercle Subacute/chronic partial tearing of the ATFL. Additional suspected chronic partial tearing/degenerationg of the superomedial of the calcanavicular spring ligament.  PATIENT SURVEYS:  FOTO 47% and predicted 61%  COGNITION: Overall cognitive status: Within  functional limits for tasks assessed     SENSATION: WFL  EDEMA:  Figure 8: R 49 cm, 47.cm   1-23- 24  RT 45cm, LT 45.25 cm  POSTURE: rounded shoulders, forward head, and anterior pelvic tilt WNL   PALPATION: TTP along the posterior cancanela tubercle with associated swelling noted as a result of haglunds deformity. Multiple trigger points  in the R calf with specific soreness along the medial soleus.  LOWER EXTREMITY ROM:  Active/ passive ROM Right eval Left eval  Hip flexion    Hip extension    Hip abduction    Hip adduction    Hip internal rotation    Hip external rotation    Knee flexion    Knee extension    Ankle dorsiflexion A2/10P A9  Ankle plantarflexion 68 51  Ankle inversion [facility] WFL  Ankle eversion WFL WFL   (Blank rows = not tested)  LOWER EXTREMITY MMT:  MMT Right eval Left eval  Hip flexion    Hip extension    Hip abduction    Hip adduction    Hip internal rotation    Hip external rotation    Knee flexion    Knee extension    Ankle dorsiflexion 5+ 5+  Ankle plantarflexion 3+ P! 5+4  Ankle inversion 4+ 4+  Ankle eversion 4+ 4+  Posterior tibalis R 3=/5 P!    L 5 Peroneal brevis/ longus (4- with P!)  L 5   (Blank rows = not tested)  LOWER EXTREMITY SPECIAL TESTS:  N/A  FUNCTIONAL TESTS:  6 min walk test - performed next visit.   GAIT: Distance walked: 120 ft to tx room Assistive device utilized: None Level of assistance: Complete Independence Comments: decreased stride on RLE with antalgic pattern noted, with decreased toe off compared bil   TODAY'S TREATMENT:   [facility] Adult PT Treatment:                                                DATE: 09-08-22 Therapeutic Exercise: *** Manual Therapy: *** Neuromuscular re-ed: *** Therapeutic Activity: *** Modalities: *** Self Care: ***   [facility] Adult PT Treatment:                                                DATE: 09-06-22 Therapeutic Exercise:  Seated Calf Raise with Weights RT Thighs  On Step  with 15 lb 3 sets - 10 reps RDL with 15 # 2 x 15 3 rounds of next  2  exercises Deadlift with 45 # KB 1 x 5 Heel raise BIL 1 x 15 Gastroc Stretch on Wall  2 reps - 30 sec hold Woodpeckers One Leg Anterior  RT 3 x 10 Woodpecker one Leg medial RT 3 x 10 Woodpecker one leg lateral RT 3 x 10 STS with 20# weight 3 x 10 Manual Therapy: Pt Acupuncture yesterday  at another practitioner and had RT medial calf proximal bruising which was avoided today by PT DTW of gastroc/soleus  Trigger Point Dry-Needling  Treatment instructions: Expect mild to moderate muscle soreness. S/S of pneumothorax if dry needled over a lung field, and to seek immediate medical attention should they occur. Patient verbalized understanding of these instructions and education.  Patient Consent Given: Yes Education handout provided: Yes Muscles treated: RT gastroc/ soleus especially medial side Electrical stimulation performed: No Parameters: N/A Treatment response/outcome: twitch response . Palpable lengthening Modalities  Ice pack on RT gastroc/achilles [facility] Adult PT Treatment:                                                DATE: 09-01-22 Therapeutic Exercise: Gastroc Stretch on Wall  2 reps - 30 sec hold Seated Calf Raise with Weights RT Thighs On Step  with 10 lb 3 sets - 10 reps Heel raise BIL 2 x 15 Woodpeckers One Leg Anterior  RT 3 x 10 Woodpecker one Leg medial RT 3 x 10 Woodpecker one leg lateral RT 3 x 10 Single leg stand to sit  1 x 10 mat elevated Manual Therapy: DTW of gastroc/soleus concentrating on medial RT gastroc/soleus Trigger Point Dry-Needling  Treatment instructions: Expect mild to moderate muscle soreness. S/S of pneumothorax if dry needled over a lung field, and to seek immediate medical attention should they occur. Patient verbalized understanding of these instructions and education.  Patient Consent Given: Yes Education handout provided: Yes Muscles treated: RT gastroc/  soleus Electrical stimulation performed: No Parameters: N/A Treatment response/outcome: twitch response  Modalities  Ice pack on RT gastroc/achilles  [facility] Adult PT Treatment:                                                DATE: 08-30-22 Therapeutic Exercise: Seated Calf Stretch with Strap 2 reps - 30 sec hold Gastroc Stretch on Wall  2 reps - 30 sec hold Ankle Plantar flexion 2 sets - 10 reps Woodpeckers One Leg Anterior  RT 3 x 10 Woodpecker one Leg medial RT 3 x 10 Woodpecker one leg lateral RT 3 x 10 Seated Calf Raise with Weights on Thighs On Step  with 10 lb 3 sets - 10 reps Seated Ankle Eversion with GTB 3 sets - 10 reps Seated Figure 4 Ankle Inversion with GTB 3 x 10 SELF CARE-  Ice massage education for pt to do at home using paper cup.  PT simulated using water bottle at clinic and educated on technique and execution with return demo  [facility] Adult PT Treatment:                                                DATE: EVALUATION Therapeutic Exercise: Seated calf stretch with strap 2 x 30 sec Bil ankle PF with RLE eccentric loading x 3-5 seconds 1 x 10 progressed to single leg 1 x 10 with cues to keep knee straight Manual Therapy: IASTM along the R gastro/ soleus Active release of gastroc/soleus   Trigger Point Dry-Needling  Treatment instructions: Expect mild to moderate muscle soreness. S/S of pneumothorax if dry needled over a lung field, and to seek immediate medical attention should they occur. Patient verbalized understanding of these instructions and education.  Patient Consent Given: Yes Education handout provided: Yes Muscles treated: R gastroc/ soleus Electrical stimulation performed: No Parameters: N/A Treatment response/outcome: twitch response    PATIENT EDUCATION:  Education details: Evaluation findings, POC, Goals, HEP with proper form/  rationale.  Person educated: Patient Education method: Explanation, Verbal cues, and Handouts Education comprehension: verbalized understanding  HOME EXERCISE PROGRAM: Access Code: [email or web address] URL: [email or web address] Date: 08/30/2022 Prepared by: Voncille Lo  Exercises - Seated Calf Stretch with Strap  - 3 x daily - 7 x weekly - 2 sets - 2 reps - 30 sec hold - Gastroc Stretch on Wall  - 3 x daily - 7 x weekly - 2 sets - 2 reps - 30 sec hold - Ankle Plantar flexion  - 1 x daily - 2 sets - 10 reps - Woodpeckers One Leg Every OTHER day  - 1 x daily - 7 x weekly - 2-3 sets - 10 reps - Seated Calf Raise with Weights on Thighs On Step  - 1 x daily - 7 x weekly - 3 sets - 10 reps - Seated Ankle Eversion with Resistance  - 1 x daily - 7 x weekly - 3 sets - 10 reps - Seated Figure 4 Ankle Inversion with Resistance  - 1 x daily - 7 x weekly - 3 sets - 10 reps  ASSESSMENT:  CLINICAL IMPRESSION: Pt returns for 4th visit to clinic. Pt entered clinic and consented to [facility] for gastroc especially on medial side with twitch responses and palpable lengthening. Session concentrating on LE functional strength using deadlifts and LE exercise for proximal hips and foot intrinsics. Initially with pain with heel raise but decreased with subsequent exercise.  Ms Rolland has received much benefit from [facility] for pain relief. STG all MET except FOTO score # 4 gait goal # 3 partially met. Pt reports able to negotiate steps up and down with alternating stepping. Will continue with POC.  EVAL- Patient is a 77 y.o. F who was seen today for physical therapy evaluation and treatment for R ankle / achille pain.  Pt reports hx of R ankle pain that has been present since she moved homes in 02/11/22 and has progressively worsened. TTP along the posterior calcaneal tubercle and distance achilles as well as distal peroneals and bil gastroc/ soleus with trigger points noted. She demonstrates limited ankle DF and  weakness with MMT of peroneals and gastroc/ soleus secondary to pain. She demonstrates an antalgic gait pattern with limited stride / toe off on the R compared bil. Reviewed TPDN and consent was provied for the R gastroc/solues followed with IASTM / active release techniques. Reviewed stretching and HEP which she noted  improvement in tension. She would benefit from physical therapy to decrease R ankle pain, improve DF ROM, increase gross strength/ stability and maximize gait efficency as well as overall function by addressing the deficits listed.   OBJECTIVE IMPAIRMENTS: Abnormal gait, decreased activity tolerance, decreased balance, decreased endurance, decreased strength, increased edema, increased fascial restrictions, increased muscle spasms, improper body mechanics, postural dysfunction, and pain.   ACTIVITY LIMITATIONS: carrying, lifting, standing, squatting, and stairs  PARTICIPATION LIMITATIONS: driving, shopping, and community activity  PERSONAL FACTORS: Age, Past/current experiences, and 1 comorbidity: osteoporosis  are also affecting patient's functional outcome.   REHAB POTENTIAL: Good  CLINICAL DECISION MAKING: Evolving/moderate complexity  EVALUATION COMPLEXITY: Moderate   GOALS: Goals reviewed with patient? Yes  SHORT TERM GOALS: Target date: 09/19/2022   Pt to be IND with initial HEP for therapeutic progression Baseline: Goal status: MET  2.  Pt to report max pain to </= 5/10 to demo improving condition Baseline: 09-06-22  at worst 4/10 at night Goal status: MET  3.  Pt to demonstrate efficient gait pattern with report of </= 5/10 pain to reduce potential proximal kinetic chain problems Baseline: 09-06-22  has slight antalgic gait but now negotiating steps up and down with alternating pattern Goal status: PARTIALLY MET  4.  Pt to increase baseline FOTO score to >/= 56%  Baseline:  Goal status: ONGOING  5.  Reduce ankle swelling by >/= 1 cm to promote ankle  mobility Baseline: RT 49, LT 47  09-06-22 RT 45cm, LT 45.25 cm Goal status:MET  LONG TERM GOALS: Target date: 10/17/2022  Increase R ankle DF to >/= 8 degrees with no report of pain during assessment to assist with toe clearance with gait.  Baseline:  Goal status: INITIAL  2.  Increase R ankle gross strength to >/= 4/5 with </=2.10 max pain during testing to demo improvement in ankle strength/ stability.  Baseline:  Goal status: INITIAL  3.  Increase FOTO score to >/= 61% to demo improvement in function Baseline:  Goal status: INITIAL  4.  Pt to report improvement in descending steps and heavy duty house chores/ ADLS with max pain of </= 2/10 pain Baseline:  Goal status: INITIAL  5.  Pt be able to peform 6 min walk test achieve appropriate age specific distance of ~1545 ft with </= 2/10 max pain Baseline:  Goal status: INITIAL   PLAN:  PT FREQUENCY: 1-2x/week  PT DURATION: 8 weeks  PLANNED INTERVENTIONS: Therapeutic exercises, Therapeutic activity, Neuromuscular re-education, Balance training, Gait training, Patient/Family education, Self Care, Joint mobilization, Joint manipulation, Stair training, Aquatic Therapy, Dry Needling, Cryotherapy, Moist heat, Taping, Vasopneumatic device, Ultrasound, Ionotophoresis '4mg'$ /ml Dexamethasone, Manual therapy, and Re-evaluation  PLAN FOR NEXT SESSION: review/ update HEP PRN. Response to TPDN for the R calf continue PRN, ankle strengthening both intrinsic/ extrinsic, gait training.   ***

## 2022-09-08 ENCOUNTER — Ambulatory Visit: Payer: Medicare Other | Admitting: Physical Therapy

## 2022-09-08 NOTE — Therapy (Signed)
OUTPATIENT PHYSICAL THERAPY LOWER EXTREMITY TREATMENT NOTE   Patient Name: Mary Garcia MRN: [id number] DOB:[birth year], 77 y.o., female Today's Date: 09/13/2022  END OF SESSION:  PT End of Session - 09/13/22 0852     Visit Number 5    Number of Visits 16    Date for PT Re-Evaluation 10/17/22    Authorization Type [employer]    PT Start Time [phone]    PT Stop Time 0930    PT Time Calculation (min) 40 min    Activity Tolerance Patient tolerated treatment well;Patient limited by pain    Behavior During Therapy [facility] for tasks assessed/performed               Past Medical History:  Diagnosis Date   Anxiety    Arthritis    Asthma    Bronchitis, acute    Past Surgical History:  Procedure Laterality Date   BREAST ENHANCEMENT SURGERY     CATARACT EXTRACTION W/ INTRAOCULAR LENS IMPLANT Bilateral    FACELIFT     local   ROTATOR CUFF REPAIR Right    TIBIA FRACTURE SURGERY  2004   left leg x3, tibia and fibula   TOTAL KNEE ARTHROPLASTY Left 10/18/2021   Procedure: TOTAL KNEE ARTHROPLASTY;  Surgeon: Vickey Huger, MD;  Location: [facility];  Service: Orthopedics;  Laterality: Left;   TUMOR EXCISION  2003   right muscle back, benign   Patient Active Problem List   Diagnosis Date Noted   Seasonal allergic rhinitis due to pollen 05/17/2021   Mild persistent asthma without complication 25/42/7062   DDD (degenerative disc disease), cervical 09/15/2017   Depression 02/02/2016   Arthropathy of right shoulder 02/02/2016   Insomnia 04/13/2011   Osteoporosis 04/13/2011   Biliary dyskinesia 04/13/2011   Osteoarthritis 04/13/2011   History of migraine headaches 04/13/2011   Vitamin D deficiency 04/13/2011   Anxiety 04/13/2011   Irritable bowel syndrome 04/13/2011    PCP: Elby Showers, MD   REFERRING PROVIDER: Armond Hang, MD   REFERRING DIAG:  Achilles tendinitis, right leg [M76.61]   THERAPY DIAG:  Pain in right ankle and joints of right foot  Muscle weakness  (generalized)  Difficulty in walking, not elsewhere classified  Chronic pain of left knee  Localized edema  Rationale for Evaluation and Treatment: Rehabilitation  ONSET DATE: Started 02/11/22 and progressively worsened  SUBJECTIVE:   SUBJECTIVE STATEMENT:  I am a 2 today. I really think the TPDN really works. I have bought new hiking shoes.  I was able to do  standing poses on yoga.  That is a win. I still have pain with my foot on the accelerator.   Eval- This reported after moving on 02/11/22 which she attributes to doing a lot of moving boxes and going up / down steps. She reports PT previously felt like it didn't help as much but also reports she felt she didn't take it easy as prescribed by her providers. She reports she continued pain that occurs throughout the day. She reports the pain is better in the AM but reports after being up for a few hours then the pain starts progressively worsens.   PERTINENT HISTORY:  L  TKA 10-18-21 ,Osteoporosis 09-13-22  2/10  at end  1/10 PAIN: 09-06-22  1-2/10 Are you having pain? Yes: NPRS scale:  at best 0/10 at worst 7/10 Pain location: back of the ankle Pain description: burning, aching, sore, sharp / shoot.  Aggravating factors: any standing/ walking, descending steps Relieving factors: sitting/  resting  PRECAUTIONS: None  WEIGHT BEARING RESTRICTIONS: No  FALLS:  Has patient fallen in last 6 months? No  LIVING ENVIRONMENT: Lives with: lives with their spouse Lives in: House/apartment Stairs: Yes: Internal: 12 steps; on left going up and External: 2 steps; none Has following equipment at home:  Ferris: Retired  PLOF: Independent  PATIENT GOALS: stop having so much pain  NEXT MD VISIT:   OBJECTIVE:   DIAGNOSTIC FINDINGS: MRI at MD's office which pt provided report for Impression of r ankle MRI 07/11/2022 Severe distal achilles tendinosis with high-grade partial tearing of the tendon at its calcaneal insertion. No  significant associated tendon retraction Mild to moderate retrocalcanel bursitis with associated with haglund defomrityof the posterior calcaneus.  Nondisplaced split tearing of the peroneus longus with retromalleolar groove and peroneal brevis near the level of the peroneal tubercle Subacute/chronic partial tearing of the ATFL. Additional suspected chronic partial tearing/degenerationg of the superomedial of the calcanavicular spring ligament.  PATIENT SURVEYS:  FOTO 47% and predicted 61%  COGNITION: Overall cognitive status: Within functional limits for tasks assessed     SENSATION: WFL  EDEMA:  Figure 8: R 49 cm, 47.cm   1-23- 24  RT 45cm, LT 45.25 cm  POSTURE: rounded shoulders, forward head, and anterior pelvic tilt WNL   PALPATION: TTP along the posterior cancanela tubercle with associated swelling noted as a result of haglunds deformity. Multiple trigger points  in the R calf with specific soreness along the medial soleus.  LOWER EXTREMITY ROM:  Active/ passive ROM Right eval Left eval  Hip flexion    Hip extension    Hip abduction    Hip adduction    Hip internal rotation    Hip external rotation    Knee flexion    Knee extension    Ankle dorsiflexion A2/10P A9  Ankle plantarflexion 68 51  Ankle inversion [facility] WFL  Ankle eversion WFL WFL   (Blank rows = not tested)  LOWER EXTREMITY MMT:  MMT Right eval Left eval  Hip flexion    Hip extension    Hip abduction    Hip adduction    Hip internal rotation    Hip external rotation    Knee flexion    Knee extension    Ankle dorsiflexion 5+ 5+  Ankle plantarflexion 3+ P! 5+4  Ankle inversion 4+ 4+  Ankle eversion 4+ 4+  Posterior tibalis R 3=/5 P!    L 5 Peroneal brevis/ longus (4- with P!)  L 5   (Blank rows = not tested)  LOWER EXTREMITY SPECIAL TESTS:  N/A  FUNCTIONAL TESTS:  6 min walk test - performed next visit.   GAIT: Distance walked: 120 ft to tx room Assistive device utilized: None Level  of assistance: Complete Independence Comments: decreased stride on RLE with antalgic pattern noted, with decreased toe off compared bil   TODAY'S TREATMENT:   [facility] Adult PT Treatment:                                                DATE: 09-13-22 Therapeutic Exercise: Pilates (red/green spring) knee ext through BIL mid foot for following exercise 2 x 10 Pilates reformer Wt bear  through balls of great foot  toes inward 2 x 10 Pilates reformer [employer] bear  through balls of great foot  toes outward2 x 10  Pilates SL  foot forward 2 x 10 Pilates SL  foot inward 2 x 10 Pilates SL  foot Outward 2 x 10 2 rounds of following RDL with 20 # 2 x 10 Seated Calf Raise with Weights RT Thighs On Step  with 20 lb 2 sets - 10 reps Deadlift with 45 # KB 2 x 5 Manual Therapy: DTW of gastroc/soleus  IASTYM Trigger Point Dry-Needling  Treatment instructions: Expect mild to moderate muscle soreness. S/S of pneumothorax if dry needled over a lung field, and to seek immediate medical attention should they occur. Patient verbalized understanding of these instructions and education.  Patient Consent Given: Yes Education handout provided: Yes Muscles treated: RT gastroc/ soleus especially medial side Electrical stimulation performed: No Parameters: N/A Treatment response/outcome: twitch response . Palpable lengthening  [facility] Adult PT Treatment:                                                DATE: 09-06-22 Therapeutic Exercise:  Seated Calf Raise with Weights RT Thighs On Step  with 15 lb 3 sets - 10 reps RDL with 15 # 2 x 15 3 rounds of next  2  exercises Deadlift with 45 # KB 1 x 5 Heel raise BIL 1 x 15 Gastroc Stretch on Wall  2 reps - 30 sec hold Woodpeckers One Leg Anterior  RT 3 x 10 Woodpecker one Leg medial RT 3 x 10 Woodpecker one leg lateral RT 3 x 10 STS with 20# weight 3 x 10 Manual Therapy: Pt Acupuncture yesterday at another practitioner and had RT medial calf proximal bruising which was avoided  today by PT DTW of gastroc/soleus  Trigger Point Dry-Needling  Treatment instructions: Expect mild to moderate muscle soreness. S/S of pneumothorax if dry needled over a lung field, and to seek immediate medical attention should they occur. Patient verbalized understanding of these instructions and education.  Patient Consent Given: Yes Education handout provided: Yes Muscles treated: RT gastroc/ soleus especially medial side Electrical stimulation performed: No Parameters: N/A Treatment response/outcome: twitch response . Palpable lengthening Modalities  Ice pack on RT gastroc/achilles [facility] Adult PT Treatment:                                                DATE: 09-01-22 Therapeutic Exercise: Gastroc Stretch on Wall  2 reps - 30 sec hold Seated Calf Raise with Weights RT Thighs On Step  with 10 lb 3 sets - 10 reps Heel raise BIL 2 x 15 Woodpeckers One Leg Anterior  RT 3 x 10 Woodpecker one Leg medial RT 3 x 10 Woodpecker one leg lateral RT 3 x 10 Single leg stand to sit  1 x 10 mat elevated Manual Therapy: DTW of gastroc/soleus concentrating on medial RT gastroc/soleus Trigger Point Dry-Needling  Treatment instructions: Expect mild to moderate muscle soreness. S/S of pneumothorax if dry needled over a lung field, and to seek immediate medical attention should they occur. Patient verbalized understanding of these instructions and education.  Patient Consent Given: Yes Education handout provided: Yes Muscles treated: RT gastroc/ soleus Electrical stimulation performed: No Parameters: N/A Treatment response/outcome: twitch response  Modalities  Ice pack on RT gastroc/achilles  [facility] Adult PT Treatment:  DATE: 08-30-22 Therapeutic Exercise: Seated Calf Stretch with Strap 2 reps - 30 sec hold Gastroc Stretch on Wall  2 reps - 30 sec hold Ankle Plantar flexion 2 sets - 10 reps Woodpeckers One Leg Anterior  RT 3 x 10 Woodpecker one Leg  medial RT 3 x 10 Woodpecker one leg lateral RT 3 x 10 Seated Calf Raise with Weights on Thighs On Step  with 10 lb 3 sets - 10 reps Seated Ankle Eversion with GTB 3 sets - 10 reps Seated Figure 4 Ankle Inversion with GTB 3 x 10 SELF CARE-  Ice massage education for pt to do at home using paper cup.  PT simulated using water bottle at clinic and educated on technique and execution with return demo                                                                                                                          [facility] Adult PT Treatment:                                                DATE: EVALUATION Therapeutic Exercise: Seated calf stretch with strap 2 x 30 sec Bil ankle PF with RLE eccentric loading x 3-5 seconds 1 x 10 progressed to single leg 1 x 10 with cues to keep knee straight Manual Therapy: IASTM along the R gastro/ soleus Active release of gastroc/soleus   Trigger Point Dry-Needling  Treatment instructions: Expect mild to moderate muscle soreness. S/S of pneumothorax if dry needled over a lung field, and to seek immediate medical attention should they occur. Patient verbalized understanding of these instructions and education.  Patient Consent Given: Yes Education handout provided: Yes Muscles treated: R gastroc/ soleus Electrical stimulation performed: No Parameters: N/A Treatment response/outcome: twitch response    PATIENT EDUCATION:  Education details: Evaluation findings, POC, Goals, HEP with proper form/ rationale.  Person educated: Patient Education method: Explanation, Verbal cues, and Handouts Education comprehension: verbalized understanding  HOME EXERCISE PROGRAM: Access Code: [email or web address] URL: [email or web address] Date: 08/30/2022 Prepared by: Voncille Lo  Exercises - Seated Calf Stretch with Strap  - 3 x daily - 7 x weekly - 2 sets - 2 reps - 30 sec hold - Gastroc Stretch on Wall  - 3 x daily - 7 x weekly - 2 sets - 2 reps - 30 sec  hold - Ankle Plantar flexion  - 1 x daily - 2 sets - 10 reps - Woodpeckers One Leg Every OTHER day  - 1 x daily - 7 x weekly - 2-3 sets - 10 reps - Seated Calf Raise with Weights on Thighs On Step  - 1 x daily - 7 x weekly - 3 sets - 10 reps - Seated Ankle Eversion with Resistance  - 1 x daily - 7 x weekly - 3 sets - 10 reps -  Seated Figure 4 Ankle Inversion with Resistance  - 1 x daily - 7 x weekly - 3 sets - 10 reps  ASSESSMENT:  CLINICAL IMPRESSION: Pt returns for 5th visit to clinic. Pt entered clinic and consented to [facility] for gastroc especially on medial side with twitch responses and palpable lengthening. Session concentrating on LE functional strength using deadlifts and LE exercise/Pilates Reformer for proximal hips and foot intrinsics. Initial pain 2/10 reduced to 1/10 at end of session.  Pt beginning yoga and standing positions now.  Ms Tamm has received much benefit from [facility] for pain relief.  Pt demo negotiate steps up and down with alternating stepping. Will maximize strength and return to leasure/hiking activities before DC  EVAL- Patient is a 77 y.o. F who was seen today for physical therapy evaluation and treatment for R ankle / achille pain.  Pt reports hx of R ankle pain that has been present since she moved homes in 02/11/22 and has progressively worsened. TTP along the posterior calcaneal tubercle and distance achilles as well as distal peroneals and bil gastroc/ soleus with trigger points noted. She demonstrates limited ankle DF and weakness with MMT of peroneals and gastroc/ soleus secondary to pain. She demonstrates an antalgic gait pattern with limited stride / toe off on the R compared bil. Reviewed TPDN and consent was provied for the R gastroc/solues followed with IASTM / active release techniques. Reviewed stretching and HEP which she noted improvement in tension. She would benefit from physical therapy to decrease R ankle pain, improve DF ROM, increase gross strength/  stability and maximize gait efficency as well as overall function by addressing the deficits listed.   OBJECTIVE IMPAIRMENTS: Abnormal gait, decreased activity tolerance, decreased balance, decreased endurance, decreased strength, increased edema, increased fascial restrictions, increased muscle spasms, improper body mechanics, postural dysfunction, and pain.   ACTIVITY LIMITATIONS: carrying, lifting, standing, squatting, and stairs  PARTICIPATION LIMITATIONS: driving, shopping, and community activity  PERSONAL FACTORS: Age, Past/current experiences, and 1 comorbidity: osteoporosis  are also affecting patient's functional outcome.   REHAB POTENTIAL: Good  CLINICAL DECISION MAKING: Evolving/moderate complexity  EVALUATION COMPLEXITY: Moderate   GOALS: Goals reviewed with patient? Yes  SHORT TERM GOALS: Target date: 09/19/2022   Pt to be IND with initial HEP for therapeutic progression Baseline: Goal status: MET  2.  Pt to report max pain to </= 5/10 to demo improving condition Baseline: 09-06-22  at worst 4/10 at night Goal status: MET  3.  Pt to demonstrate efficient gait pattern with report of </= 5/10 pain to reduce potential proximal kinetic chain problems Baseline: 09-06-22  has slight antalgic gait but now negotiating steps up and down with alternating pattern 09-13-22   1/10 at end of RX  initial 2/10 Goal status: MET  4.  Pt to increase baseline FOTO score to >/= 56%  Baseline:  Goal status: ONGOING  5.  Reduce ankle swelling by >/= 1 cm to promote ankle mobility Baseline: RT 49, LT 47  09-06-22 RT 45cm, LT 45.25 cm Goal status:MET  LONG TERM GOALS: Target date: 10/17/2022  Increase R ankle DF to >/= 8 degrees with no report of pain during assessment to assist with toe clearance with gait.  Baseline:  Goal status: INITIAL  2.  Increase R ankle gross strength to >/= 4/5 with </=2.10 max pain during testing to demo improvement in ankle strength/ stability.  Baseline:   Goal status: INITIAL  3.  Increase FOTO score to >/= 61% to demo improvement in  function Baseline:  Goal status: INITIAL  4.  Pt to report improvement in descending steps and heavy duty house chores/ ADLS with max pain of </= 2/10 pain Baseline:  Goal status: INITIAL  5.  Pt be able to peform 6 min walk test achieve appropriate age specific distance of ~1545 ft with </= 2/10 max pain Baseline:  Goal status: INITIAL   PLAN:  PT FREQUENCY: 1-2x/week  PT DURATION: 8 weeks  PLANNED INTERVENTIONS: Therapeutic exercises, Therapeutic activity, Neuromuscular re-education, Balance training, Gait training, Patient/Family education, Self Care, Joint mobilization, Joint manipulation, Stair training, Aquatic Therapy, Dry Needling, Cryotherapy, Moist heat, Taping, Vasopneumatic device, Ultrasound, Ionotophoresis '4mg'$ /ml Dexamethasone, Manual therapy, and Re-evaluation  PLAN FOR NEXT SESSION: review/ update HEP PRN. Response to TPDN for the R calf continue PRN, ankle strengthening both intrinsic/ extrinsic, gait training.  FOTO next visit and goals  AROM measures  Voncille Lo, PT, [facility] Certified Exercise Expert for the Aging Adult  09/13/22 12:24 PM Phone: [phone] Fax: [phone]

## 2022-09-13 ENCOUNTER — Encounter: Payer: Self-pay | Admitting: Physical Therapy

## 2022-09-13 ENCOUNTER — Other Ambulatory Visit: Payer: Self-pay

## 2022-09-13 ENCOUNTER — Ambulatory Visit: Payer: Medicare Other | Admitting: Physical Therapy

## 2022-09-13 DIAGNOSIS — G8929 Other chronic pain: Secondary | ICD-10-CM | POA: Diagnosis not present

## 2022-09-13 DIAGNOSIS — R262 Difficulty in walking, not elsewhere classified: Secondary | ICD-10-CM | POA: Diagnosis not present

## 2022-09-13 DIAGNOSIS — M25562 Pain in left knee: Secondary | ICD-10-CM | POA: Diagnosis not present

## 2022-09-13 DIAGNOSIS — R600 Localized edema: Secondary | ICD-10-CM

## 2022-09-13 DIAGNOSIS — M6281 Muscle weakness (generalized): Secondary | ICD-10-CM

## 2022-09-13 DIAGNOSIS — M25571 Pain in right ankle and joints of right foot: Secondary | ICD-10-CM

## 2022-09-22 ENCOUNTER — Encounter: Payer: Self-pay | Admitting: Physical Therapy

## 2022-09-22 ENCOUNTER — Ambulatory Visit: Payer: Medicare Other | Attending: Internal Medicine | Admitting: Physical Therapy

## 2022-09-22 DIAGNOSIS — M6281 Muscle weakness (generalized): Secondary | ICD-10-CM | POA: Insufficient documentation

## 2022-09-22 DIAGNOSIS — R600 Localized edema: Secondary | ICD-10-CM | POA: Diagnosis not present

## 2022-09-22 DIAGNOSIS — R262 Difficulty in walking, not elsewhere classified: Secondary | ICD-10-CM | POA: Diagnosis not present

## 2022-09-22 DIAGNOSIS — M25571 Pain in right ankle and joints of right foot: Secondary | ICD-10-CM | POA: Diagnosis not present

## 2022-09-22 DIAGNOSIS — G8929 Other chronic pain: Secondary | ICD-10-CM | POA: Diagnosis not present

## 2022-09-22 DIAGNOSIS — M25562 Pain in left knee: Secondary | ICD-10-CM | POA: Insufficient documentation

## 2022-09-22 NOTE — Therapy (Signed)
OUTPATIENT PHYSICAL THERAPY LOWER EXTREMITY TREATMENT NOTE   Patient Name: Mary Garcia MRN: [id number] DOB:14-Aug-1946, 77 y.o., female Today's Date: 09/22/2022  END OF SESSION:  PT End of Session - 09/22/22 0928     Visit Number 6    Number of Visits 16    Date for PT Re-Evaluation 10/17/22    Authorization Type [employer]    PT Start Time 0929    PT Stop Time 1018    PT Time Calculation (min) 49 min    Activity Tolerance Patient tolerated treatment well;Patient limited by pain    Behavior During Therapy WFL for tasks assessed/performed                Past Medical History:  Diagnosis Date   Anxiety    Arthritis    Asthma    Bronchitis, acute    Past Surgical History:  Procedure Laterality Date   BREAST ENHANCEMENT SURGERY     CATARACT EXTRACTION W/ INTRAOCULAR LENS IMPLANT Bilateral    FACELIFT     local   ROTATOR CUFF REPAIR Right    TIBIA FRACTURE SURGERY  2004   left leg x3, tibia and fibula   TOTAL KNEE ARTHROPLASTY Left 10/18/2021   Procedure: TOTAL KNEE ARTHROPLASTY;  Surgeon: Vickey Huger, MD;  Location: [facility];  Service: Orthopedics;  Laterality: Left;   TUMOR EXCISION  2003   right muscle back, benign   Patient Active Problem List   Diagnosis Date Noted   Seasonal allergic rhinitis due to pollen 05/17/2021   Mild persistent asthma without complication 00/92/3300   DDD (degenerative disc disease), cervical 09/15/2017   Depression 02/02/2016   Arthropathy of right shoulder 02/02/2016   Insomnia 04/13/2011   Osteoporosis 04/13/2011   Biliary dyskinesia 04/13/2011   Osteoarthritis 04/13/2011   History of migraine headaches 04/13/2011   Vitamin D deficiency 04/13/2011   Anxiety 04/13/2011   Irritable bowel syndrome 04/13/2011    PCP: Elby Showers, MD   REFERRING PROVIDER: Armond Hang, MD   REFERRING DIAG:  Achilles tendinitis, right leg [M76.61]   THERAPY DIAG:  Pain in right ankle and joints of right foot  Muscle weakness  (generalized)  Difficulty in walking, not elsewhere classified  Rationale for Evaluation and Treatment: Rehabilitation  ONSET DATE: Started 02/11/22 and progressively worsened  SUBJECTIVE:   SUBJECTIVE STATEMENT:  "The DN with PT and I have been going to an accupunturist 1 x week, and the nitroglycerin patches have really helped. I do feel like I slide back after the travelling last week."   PERTINENT HISTORY:  L  TKA 10-18-21 ,Osteoporosis 09-13-22  2/10  at end  1/10  PAIN:  Are you having pain? Yes: NPRS scale: 0/10 Pain location: back of the ankle Pain description: burning, aching, sore, sharp / shoot.  Aggravating factors: any standing/ walking, descending steps Relieving factors: sitting/ resting  PRECAUTIONS: None  WEIGHT BEARING RESTRICTIONS: No  FALLS:  Has patient fallen in last 6 months? No  LIVING ENVIRONMENT: Lives with: lives with their spouse Lives in: House/apartment Stairs: Yes: Internal: 12 steps; on left going up and External: 2 steps; none Has following equipment at home:  [place]: Retired  PLOF: Independent  PATIENT GOALS: stop having so much pain  NEXT MD VISIT:   OBJECTIVE:   DIAGNOSTIC FINDINGS: MRI at MD's office which pt provided report for Impression of r ankle MRI 07/11/2022 Severe distal achilles tendinosis with high-grade partial tearing of the tendon at its calcaneal insertion. No  significant associated tendon retraction Mild to moderate retrocalcanel bursitis with associated with haglund defomrityof the posterior calcaneus.  Nondisplaced split tearing of the peroneus longus with retromalleolar groove and peroneal brevis near the level of the peroneal tubercle Subacute/chronic partial tearing of the ATFL. Additional suspected chronic partial tearing/degenerationg of the superomedial of the calcanavicular spring ligament.  PATIENT SURVEYS:  FOTO 47% and predicted 61% 09/22/2024  65%  COGNITION: Overall cognitive status:  Within functional limits for tasks assessed     SENSATION: WFL  EDEMA:  Figure 8: R 49 cm, 47.cm   1-23- 24  RT 45cm, LT 45.25 cm  POSTURE: rounded shoulders, forward head, and anterior pelvic tilt WNL   PALPATION: TTP along the posterior cancanela tubercle with associated swelling noted as a result of haglunds deformity. Multiple trigger points  in the R calf with specific soreness along the medial soleus.  LOWER EXTREMITY ROM:  Active/ passive ROM Right eval Left eval  Hip flexion    Hip extension    Hip abduction    Hip adduction    Hip internal rotation    Hip external rotation    Knee flexion    Knee extension    Ankle dorsiflexion A2/10P A9  Ankle plantarflexion 68 51  Ankle inversion [facility] WFL  Ankle eversion WFL WFL   (Blank rows = not tested)  LOWER EXTREMITY MMT:  MMT Right eval Left eval  Hip flexion    Hip extension    Hip abduction    Hip adduction    Hip internal rotation    Hip external rotation    Knee flexion    Knee extension    Ankle dorsiflexion 5+ 5+  Ankle plantarflexion 3+ P! 5+4  Ankle inversion 4+ 4+  Ankle eversion 4+ 4+  Posterior tibalis R 3=/5 P!    L 5 Peroneal brevis/ longus (4- with P!)  L 5   (Blank rows = not tested)  LOWER EXTREMITY SPECIAL TESTS:  N/A  FUNCTIONAL TESTS:  6 min walk test - performed next visit.   GAIT: Distance walked: 120 ft to tx room Assistive device utilized: None Level of assistance: Complete Independence Comments: decreased stride on RLE with antalgic pattern noted, with decreased toe off compared bil   TODAY'S TREATMENT:   [facility] Adult PT Treatment:                                                DATE: 09/22/2022 Therapeutic Exercise: Elliptical L1 x 3 min ramp L1 Gastroc/ solues stretch 1 x 30 sec ea on slant board Dead lift 1 x with 15# KB, then 2 x with 25# KB going to fatigue Heel raises off end of step 1 x to fatigue, 1 con bil/ eccentric RLE to fatigue then progressing to bil LE calves   Sustained squat position doing lateral band walks. Using low table for reference with RTB.   Manual Therapy: IASTM gastroc / solues and posterior tib.  Trigger Point Dry-Needling  Treatment instructions: Expect mild to moderate muscle soreness. S/S of pneumothorax if dry needled over a lung field, and to seek immediate medical attention should they occur. Patient verbalized understanding of these instructions and education.  Patient Consent Given: No Education handout provided: Previously provided Muscles treated: peroneal longus, Lateral gastroc and posterior tib Electrical stimulation performed: No Parameters: N/A Treatment response/outcome: twitch response noted  muscle lengthening.      [facility] Adult PT Treatment:                                                DATE: 09-13-22 Therapeutic Exercise: Pilates (red/green spring) knee ext through BIL mid foot for following exercise 2 x 10 Pilates reformer Wt bear  through balls of great foot  toes inward 2 x 10 Pilates reformer [employer] bear  through balls of great foot  toes outward2 x 10 Pilates SL  foot forward 2 x 10 Pilates SL  foot inward 2 x 10 Pilates SL  foot Outward 2 x 10 2 rounds of following RDL with 20 # 2 x 10 Seated Calf Raise with Weights RT Thighs On Step  with 20 lb 2 sets - 10 reps Deadlift with 45 # KB 2 x 5 Manual Therapy: DTW of gastroc/soleus  IASTYM Trigger Point Dry-Needling  Treatment instructions: Expect mild to moderate muscle soreness. S/S of pneumothorax if dry needled over a lung field, and to seek immediate medical attention should they occur. Patient verbalized understanding of these instructions and education.  Patient Consent Given: Yes Education handout provided: Yes Muscles treated: RT gastroc/ soleus especially medial side Electrical stimulation performed: No Parameters: N/A Treatment response/outcome: twitch response . Palpable lengthening  [facility] Adult PT Treatment:                                                 DATE: 09-06-22 Therapeutic Exercise:  Seated Calf Raise with Weights RT Thighs On Step  with 15 lb 3 sets - 10 reps RDL with 15 # 2 x 15 3 rounds of next  2  exercises Deadlift with 45 # KB 1 x 5 Heel raise BIL 1 x 15 Gastroc Stretch on Wall  2 reps - 30 sec hold Woodpeckers One Leg Anterior  RT 3 x 10 Woodpecker one Leg medial RT 3 x 10 Woodpecker one leg lateral RT 3 x 10 STS with 20# weight 3 x 10 Manual Therapy: Pt Acupuncture yesterday at another practitioner and had RT medial calf proximal bruising which was avoided today by PT DTW of gastroc/soleus  Trigger Point Dry-Needling  Treatment instructions: Expect mild to moderate muscle soreness. S/S of pneumothorax if dry needled over a lung field, and to seek immediate medical attention should they occur. Patient verbalized understanding of these instructions and education.  Patient Consent Given: Yes Education handout provided: Yes Muscles treated: RT gastroc/ soleus especially medial side Electrical stimulation performed: No Parameters: N/A Treatment response/outcome: twitch response . Palpable lengthening Modalities  Ice pack on RT gastroc/achilles   PATIENT EDUCATION:  Education details: Evaluation findings, POC, Goals, HEP with proper form/ rationale.  Person educated: Patient Education method: Explanation, Verbal cues, and Handouts Education comprehension: verbalized understanding  HOME EXERCISE PROGRAM: Access Code: [email or web address] URL: [email or web address] Date: 08/30/2022 Prepared by: Voncille Lo  Exercises - Seated Calf Stretch with Strap  - 3 x daily - 7 x weekly - 2 sets - 2 reps - 30 sec hold - Gastroc Stretch on Wall  - 3 x daily - 7 x weekly - 2 sets - 2 reps - 30 sec hold - Ankle Plantar flexion  - 1 x daily -  2 sets - 10 reps - Woodpeckers One Leg Every OTHER day  - 1 x daily - 7 x weekly - 2-3 sets - 10 reps - Seated Calf Raise with Weights on Thighs On Step  - 1 x daily - 7  x weekly - 3 sets - 10 reps - Seated Ankle Eversion with Resistance  - 1 x daily - 7 x weekly - 3 sets - 10 reps - Seated Figure 4 Ankle Inversion with Resistance  - 1 x daily - 7 x weekly - 3 sets - 10 reps  ASSESSMENT:  CLINICAL IMPRESSION: Pt arrives to PT today noting she is doing better but feels she had a setback from traveling and not being able to perform her exercises to the extent of what she had been doing. She has met her FOTO goal increasing to 65% today. Continued TPDN focusing on the R lateral gastroc head, peroneal longus and posterior tib followed with IASTM techniques. Continued working on gross LE activation utilizing compound exercises with focus on increased reps to maximize her endurance and overall stability. End of session she noted she felt good.   OBJECTIVE IMPAIRMENTS: Abnormal gait, decreased activity tolerance, decreased balance, decreased endurance, decreased strength, increased edema, increased fascial restrictions, increased muscle spasms, improper body mechanics, postural dysfunction, and pain.   ACTIVITY LIMITATIONS: carrying, lifting, standing, squatting, and stairs  PARTICIPATION LIMITATIONS: driving, shopping, and community activity  PERSONAL FACTORS: Age, Past/current experiences, and 1 comorbidity: osteoporosis  are also affecting patient's functional outcome.   REHAB POTENTIAL: Good  CLINICAL DECISION MAKING: Evolving/moderate complexity  EVALUATION COMPLEXITY: Moderate   GOALS: Goals reviewed with patient? Yes  SHORT TERM GOALS: Target date: 09/19/2022   Pt to be IND with initial HEP for therapeutic progression Baseline: Goal status: MET  2.  Pt to report max pain to </= 5/10 to demo improving condition Baseline: 09-06-22  at worst 4/10 at night Goal status: MET  3.  Pt to demonstrate efficient gait pattern with report of </= 5/10 pain to reduce potential proximal kinetic chain problems Baseline: 09-06-22  has slight antalgic gait but now  negotiating steps up and down with alternating pattern 09-13-22   1/10 at end of RX  initial 2/10 Goal status: MET  4.  Pt to increase baseline FOTO score to >/= 56%  Baseline:  Goal status: MET 09/22/2022  5.  Reduce ankle swelling by >/= 1 cm to promote ankle mobility Baseline: RT 49, LT 47  09-06-22 RT 45cm, LT 45.25 cm Goal status:MET  LONG TERM GOALS: Target date: 10/17/2022  Increase R ankle DF to >/= 8 degrees with no report of pain during assessment to assist with toe clearance with gait.  Baseline:  Goal status: INITIAL  2.  Increase R ankle gross strength to >/= 4/5 with </=2.10 max pain during testing to demo improvement in ankle strength/ stability.  Baseline:  Goal status: INITIAL  3.  Increase FOTO score to >/= 61% to demo improvement in function Baseline:  Goal status: INITIAL  4.  Pt to report improvement in descending steps and heavy duty house chores/ ADLS with max pain of </= 2/10 pain Baseline:  Goal status: INITIAL  5.  Pt be able to peform 6 min walk test achieve appropriate age specific distance of ~1545 ft with </= 2/10 max pain Baseline:  Goal status: INITIAL   PLAN:  PT FREQUENCY: 1-2x/week  PT DURATION: 8 weeks  PLANNED INTERVENTIONS: Therapeutic exercises, Therapeutic activity, Neuromuscular re-education, Balance training, Gait training,  Patient/Family education, Self Care, Joint mobilization, Joint manipulation, Stair training, Aquatic Therapy, Dry Needling, Cryotherapy, Moist heat, Taping, Vasopneumatic device, Ultrasound, Ionotophoresis '4mg'$ /ml Dexamethasone, Manual therapy, and Re-evaluation  PLAN FOR NEXT SESSION: review/ update HEP PRN. Response to TPDN for the R calf continue PRN, ankle strengthening both intrinsic/ extrinsic, gait training.  FOTO next visit and goals  AROM measures. Plan to begin dropping down to 1 x a week then progress to 1 x every other week.   Sharnae Winfree PT, DPT, LAT, ATC  09/22/22  10:31 AM

## 2022-09-27 ENCOUNTER — Ambulatory Visit: Payer: Medicare Other | Admitting: Physical Therapy

## 2022-09-27 ENCOUNTER — Encounter: Payer: Self-pay | Admitting: Physical Therapy

## 2022-09-27 DIAGNOSIS — M6281 Muscle weakness (generalized): Secondary | ICD-10-CM | POA: Diagnosis not present

## 2022-09-27 DIAGNOSIS — M25562 Pain in left knee: Secondary | ICD-10-CM | POA: Diagnosis not present

## 2022-09-27 DIAGNOSIS — R262 Difficulty in walking, not elsewhere classified: Secondary | ICD-10-CM | POA: Diagnosis not present

## 2022-09-27 DIAGNOSIS — R600 Localized edema: Secondary | ICD-10-CM

## 2022-09-27 DIAGNOSIS — M25571 Pain in right ankle and joints of right foot: Secondary | ICD-10-CM | POA: Diagnosis not present

## 2022-09-27 DIAGNOSIS — G8929 Other chronic pain: Secondary | ICD-10-CM | POA: Diagnosis not present

## 2022-09-27 NOTE — Therapy (Signed)
OUTPATIENT PHYSICAL THERAPY LOWER EXTREMITY TREATMENT NOTE   Patient Name: Mary Garcia MRN: [id number] DOB:1946-05-24, 77 y.o., female Today's Date: 09/27/2022  END OF SESSION:  PT End of Session - 09/27/22 1046     Visit Number 7    Number of Visits 16    Date for PT Re-Evaluation 10/17/22    Authorization Type BC[employer]    PT Start Time 0932    PT Stop Time 1017    PT Time Calculation (min) 45 min    Activity Tolerance Patient tolerated treatment well    Behavior During Therapy WFL for tasks assessed/performed                 Past Medical History:  Diagnosis Date   Anxiety    Arthritis    Asthma    Bronchitis, acute    Past Surgical History:  Procedure Laterality Date   BREAST ENHANCEMENT SURGERY     CATARACT EXTRACTION W/ INTRAOCULAR LENS IMPLANT Bilateral    FACELIFT     local   ROTATOR CUFF REPAIR Right    TIBIA FRACTURE SURGERY  2004   left leg x3, tibia and fibula   TOTAL KNEE ARTHROPLASTY Left 10/18/2021   Procedure: TOTAL KNEE ARTHROPLASTY;  Surgeon: Vickey Huger, MD;  Location: [facility];  Service: Orthopedics;  Laterality: Left;   TUMOR EXCISION  2003   right muscle back, benign   Patient Active Problem List   Diagnosis Date Noted   Seasonal allergic rhinitis due to pollen 05/17/2021   Mild persistent asthma without complication [id number]   DDD (degenerative disc disease), cervical 09/15/2017   Depression 02/02/2016   Arthropathy of right shoulder 02/02/2016   Insomnia 04/13/2011   Osteoporosis 04/13/2011   Biliary dyskinesia 04/13/2011   Osteoarthritis 04/13/2011   History of migraine headaches 04/13/2011   Vitamin D deficiency 04/13/2011   Anxiety 04/13/2011   Irritable bowel syndrome 04/13/2011    PCP: Elby Showers, MD   REFERRING PROVIDER: Armond Hang, MD   REFERRING DIAG:  Achilles tendinitis, right leg [M76.61]   THERAPY DIAG:  Pain in right ankle and joints of right foot  Muscle weakness (generalized)  Difficulty in  walking, not elsewhere classified  Localized edema  Chronic pain of left knee  Rationale for Evaluation and Treatment: Rehabilitation  ONSET DATE: Started 02/11/22 and progressively worsened  SUBJECTIVE:   SUBJECTIVE STATEMENT:  "The DN with PT and I have been going to an accupunturist 1 x week, and the nitroglycerin patches have really helped. I do feel like I slide back after the travelling last week."   PERTINENT HISTORY:  L  TKA 10-18-21 ,Osteoporosis 09-13-22  2/10  at end  1/10 09-27-22  1/10  PAIN:  Are you having pain? Yes: NPRS scale: 0/10 Pain location: back of the ankle Pain description: burning, aching, sore, sharp / shoot.  Aggravating factors: any standing/ walking, descending steps Relieving factors: sitting/ resting  PRECAUTIONS: None  WEIGHT BEARING RESTRICTIONS: No  FALLS:  Has patient fallen in last 6 months? No  LIVING ENVIRONMENT: Lives with: lives with their spouse Lives in: House/apartment Stairs: Yes: Internal: 12 steps; on left going up and External: 2 steps; none Has following equipment at home:  [place]: Retired  PLOF: Independent  PATIENT GOALS: stop having so much pain  NEXT MD VISIT:   OBJECTIVE:   DIAGNOSTIC FINDINGS: MRI at MD's office which pt provided report for Impression of r ankle MRI 07/11/2022 Severe distal achilles tendinosis with high-grade  partial tearing of the tendon at its calcaneal insertion. No significant associated tendon retraction Mild to moderate retrocalcanel bursitis with associated with haglund defomrityof the posterior calcaneus.  Nondisplaced split tearing of the peroneus longus with retromalleolar groove and peroneal brevis near the level of the peroneal tubercle Subacute/chronic partial tearing of the ATFL. Additional suspected chronic partial tearing/degenerationg of the superomedial of the calcanavicular spring ligament.  PATIENT SURVEYS:  FOTO 47% and predicted 61% 09/22/2024   65%  COGNITION: Overall cognitive status: Within functional limits for tasks assessed     SENSATION: WFL  EDEMA:  Figure 8: R 49 cm, 47.cm   1-23- 24  RT 45cm, LT 45.25 cm  POSTURE: rounded shoulders, forward head, and anterior pelvic tilt WNL   PALPATION: TTP along the posterior cancanela tubercle with associated swelling noted as a result of haglunds deformity. Multiple trigger points  in the R calf with specific soreness along the medial soleus.  LOWER EXTREMITY ROM:  Active/ passive ROM Right eval Left eval RT  Hip flexion     Hip extension     Hip abduction     Hip adduction     Hip internal rotation     Hip external rotation     Knee flexion     Knee extension     Ankle dorsiflexion A2/10P A9 A5/10P  Ankle plantarflexion 68 51   Ankle inversion [facility] WFL   Ankle eversion WFL WFL    (Blank rows = not tested)  LOWER EXTREMITY MMT:  MMT Right eval Left eval  Hip flexion    Hip extension    Hip abduction    Hip adduction    Hip internal rotation    Hip external rotation    Knee flexion    Knee extension    Ankle dorsiflexion 5+ 5+  Ankle plantarflexion 3+ P! 5+4  Ankle inversion 4+ 4+  Ankle eversion 4+ 4+  Posterior tibalis R 3=/5 P!    L 5 Peroneal brevis/ longus (4- with P!)  L 5   (Blank rows = not tested)  LOWER EXTREMITY SPECIAL TESTS:  N/A  FUNCTIONAL TESTS:  6 min walk test - 1669 ft outdoors   GAIT: Distance walked: 120 ft to tx room Assistive device utilized: None Level of assistance: Complete Independence Comments: decreased stride on RLE with antalgic pattern noted, with decreased toe off compared bil   TODAY'S TREATMENT:   [facility] Adult PT Treatment:                                                DATE: 09-27-22 Therapeutic Exercise:  Gastroc/ solues stretch 1 x 30 sec ea on slant board Dead lift  ladder 10 x with 15# KB, then 5 x with 25# KB then 3 x with 45#  Heel raises off end of step 1 x to fatigue, 1 con bil/ eccentric RLE to  fatigue then progressing to bil LE calves  Step ups on 8 inch 12 x 2 on RT Heel raise SL on RT 10x 1/2 range 6 min walk test - 1669 ft outdoors (norm [id number]) Manual Therapy: Manual Therapy: IASTM gastroc / solues and posterior tib.  Trigger Point Dry-Needling  Treatment instructions: Expect mild to moderate muscle soreness. S/S of pneumothorax if dry needled over a lung field, and to seek immediate medical attention should they occur. Patient verbalized  understanding of these instructions and education.  Patient Consent Given: No Education handout provided: Previously provided Muscles treated: peroneal longus, Lateral gastroc and posterior tib Electrical stimulation performed: No Parameters: N/A Treatment response/outcome: twitch response noted muscle lengthening.   [facility] Adult PT Treatment:                                                DATE: 09/22/2022 Therapeutic Exercise: Elliptical L1 x 3 min ramp L1 Gastroc/ solues stretch 1 x 30 sec ea on slant board Dead lift 1 x with 15# KB, then 2 x with 25# KB going to fatigue Heel raises off end of step 1 x to fatigue, 1 con bil/ eccentric RLE to fatigue then progressing to bil LE calves  Sustained squat position doing lateral band walks. Using low table for reference with RTB.   Manual Therapy: IASTM gastroc / solues and posterior tib.  Trigger Point Dry-Needling  Treatment instructions: Expect mild to moderate muscle soreness. S/S of pneumothorax if dry needled over a lung field, and to seek immediate medical attention should they occur. Patient verbalized understanding of these instructions and education.  Patient Consent Given: No Education handout provided: Previously provided Muscles treated: peroneal longus, Lateral gastroc and posterior tib Electrical stimulation performed: No Parameters: N/A Treatment response/outcome: twitch response noted muscle lengthening.      [facility] Adult PT Treatment:                                                 DATE: 09-13-22 Therapeutic Exercise: Pilates (red/green spring) knee ext through BIL mid foot for following exercise 2 x 10 Pilates reformer Wt bear  through balls of great foot  toes inward 2 x 10 Pilates reformer [employer] bear  through balls of great foot  toes outward2 x 10 Pilates SL  foot forward 2 x 10 Pilates SL  foot inward 2 x 10 Pilates SL  foot Outward 2 x 10 2 rounds of following RDL with 20 # 2 x 10 Seated Calf Raise with Weights RT Thighs On Step  with 20 lb 2 sets - 10 reps Deadlift with 45 # KB 2 x 5 Manual Therapy: DTW of gastroc/soleus  IASTYM Trigger Point Dry-Needling  Treatment instructions: Expect mild to moderate muscle soreness. S/S of pneumothorax if dry needled over a lung field, and to seek immediate medical attention should they occur. Patient verbalized understanding of these instructions and education.  Patient Consent Given: Yes Education handout provided: Yes Muscles treated: RT gastroc/ soleus especially medial side Electrical stimulation performed: No Parameters: N/A Treatment response/outcome: twitch response . Palpable lengthening  [facility] Adult PT Treatment:                                                DATE: 09-06-22 Therapeutic Exercise:  Seated Calf Raise with Weights RT Thighs On Step  with 15 lb 3 sets - 10 reps RDL with 15 # 2 x 15 3 rounds of next  2  exercises Deadlift with 45 # KB 1 x 5 Heel raise BIL 1 x 15 Gastroc Stretch  on Wall  2 reps - 30 sec hold Woodpeckers One Leg Anterior  RT 3 x 10 Woodpecker one Leg medial RT 3 x 10 Woodpecker one leg lateral RT 3 x 10 STS with 20# weight 3 x 10 Manual Therapy: Pt Acupuncture yesterday at another practitioner and had RT medial calf proximal bruising which was avoided today by PT DTW of gastroc/soleus  Trigger Point Dry-Needling  Treatment instructions: Expect mild to moderate muscle soreness. S/S of pneumothorax if dry needled over a lung field, and to seek immediate medical  attention should they occur. Patient verbalized understanding of these instructions and education.  Patient Consent Given: Yes Education handout provided: Yes Muscles treated: RT gastroc/ soleus especially medial side Electrical stimulation performed: No Parameters: N/A Treatment response/outcome: twitch response . Palpable lengthening Modalities  Ice pack on RT gastroc/achilles   PATIENT EDUCATION:  Education details: Evaluation findings, POC, Goals, HEP with proper form/ rationale.  Person educated: Patient Education method: Explanation, Verbal cues, and Handouts Education comprehension: verbalized understanding  HOME EXERCISE PROGRAM: Access Code: [email or web address] URL: [email or web address] Date: 08/30/2022 Prepared by: Voncille Lo  Exercises - Seated Calf Stretch with Strap  - 3 x daily - 7 x weekly - 2 sets - 2 reps - 30 sec hold - Gastroc Stretch on Wall  - 3 x daily - 7 x weekly - 2 sets - 2 reps - 30 sec hold - Ankle Plantar flexion  - 1 x daily - 2 sets - 10 reps - Woodpeckers One Leg Every OTHER day  - 1 x daily - 7 x weekly - 2-3 sets - 10 reps - Seated Calf Raise with Weights on Thighs On Step  - 1 x daily - 7 x weekly - 3 sets - 10 reps - Seated Ankle Eversion with Resistance  - 1 x daily - 7 x weekly - 3 sets - 10 reps - Seated Figure 4 Ankle Inversion with Resistance  - 1 x daily - 7 x weekly - 3 sets - 10 reps  ASSESSMENT:  CLINICAL IMPRESSION: Pt arrives to PT today noting she is doing better 1/10 and performed 6 MWT -1669 ft outdoors (Norm- 1440-1845)  during 6 MWT pt did fall on uneven pine needles over a cord for lighting and fell with forearms and PT guiding her down. Pt able to rise quickly with no pain or injury.  No other adverse effects noted and was able to continue 6 MWT within normal limits.  Goals LTG # 3 and 5 met today.  Pt has good community wellness connections and main goals are to finalize HEP and to maximize gastroc strength and ankle  AROM in remaining visits.  Continued TPDN focusing on the R lateral gastroc head, peroneal longus and posterior tib followed with IASTM techniques. Continued working on gross LE activation utilizing compound exercises with focus on increased reps to maximize her endurance and overall stability.   OBJECTIVE IMPAIRMENTS: Abnormal gait, decreased activity tolerance, decreased balance, decreased endurance, decreased strength, increased edema, increased fascial restrictions, increased muscle spasms, improper body mechanics, postural dysfunction, and pain.   ACTIVITY LIMITATIONS: carrying, lifting, standing, squatting, and stairs  PARTICIPATION LIMITATIONS: driving, shopping, and community activity  PERSONAL FACTORS: Age, Past/current experiences, and 1 comorbidity: osteoporosis  are also affecting patient's functional outcome.   REHAB POTENTIAL: Good  CLINICAL DECISION MAKING: Evolving/moderate complexity  EVALUATION COMPLEXITY: Moderate   GOALS: Goals reviewed with patient? Yes  SHORT TERM GOALS: Target date: 09/19/2022   Pt to be IND with initial  HEP for therapeutic progression Baseline: Goal status: MET  2.  Pt to report max pain to </= 5/10 to demo improving condition Baseline: 09-06-22  at worst 4/10 at night Goal status: MET  3.  Pt to demonstrate efficient gait pattern with report of </= 5/10 pain to reduce potential proximal kinetic chain problems Baseline: 09-06-22  has slight antalgic gait but now negotiating steps up and down with alternating pattern 09-13-22   1/10 at end of RX  initial 2/10 Goal status: MET  4.  Pt to increase baseline FOTO score to >/= 56%  Baseline:  Goal status: MET 09/22/2022  5.  Reduce ankle swelling by >/= 1 cm to promote ankle mobility Baseline: RT 49, LT 47  09-06-22 RT 45cm, LT 45.25 cm Goal status:MET  LONG TERM GOALS: Target date: 10/17/2022  Increase R ankle DF to >/= 8 degrees with no report of pain during assessment to assist with toe  clearance with gait.  Baseline:  Goal status: INITIAL  2.  Increase R ankle gross strength to >/= 4/5 with </=2.10 max pain during testing to demo improvement in ankle strength/ stability.  Baseline:  Goal status: ONGOING  3.  Increase FOTO score to >/= 61% to demo improvement in function Baseline: 09-22-22 65% Goal status MET  4.  Pt to report improvement in descending steps and heavy duty house chores/ ADLS with max pain of </= 2/10 pain Baseline: 65% Goal status: INITIAL  5.  Pt be able to peform 6 min walk test achieve appropriate age specific distance of ~1545 ft with </= 2/10 max pain Baseline: 1669 ft outdoors  2/10 Goal status: MET      PLAN:  PT FREQUENCY: 1-2x/week  PT DURATION: 8 weeks  PLANNED INTERVENTIONS: Therapeutic exercises, Therapeutic activity, Neuromuscular re-education, Balance training, Gait training, Patient/Family education, Self Care, Joint mobilization, Joint manipulation, Stair training, Aquatic Therapy, Dry Needling, Cryotherapy, Moist heat, Taping, Vasopneumatic device, Ultrasound, Ionotophoresis 67m/ml Dexamethasone, Manual therapy, and Re-evaluation  PLAN FOR NEXT SESSION: review/ update HEP PRN. Response to TPDN for the R calf continue PRN, ankle strengthening both intrinsic/ extrinsic, gait training.  FOTO next visit and goals  AROM measures. Plan to begin dropping down to 1 x a week then progress to 1 x every other week.   LVoncille Lo PT, AOrangeCertified Exercise Expert for the Aging Adult  09/27/22 10:47 AM Phone: [phone]: [phone]

## 2022-09-29 ENCOUNTER — Ambulatory Visit: Payer: Medicare Other | Admitting: Physical Therapy

## 2022-10-04 ENCOUNTER — Other Ambulatory Visit: Payer: Self-pay

## 2022-10-04 ENCOUNTER — Ambulatory Visit: Payer: Medicare Other | Admitting: Physical Therapy

## 2022-10-04 DIAGNOSIS — R600 Localized edema: Secondary | ICD-10-CM

## 2022-10-04 DIAGNOSIS — M25562 Pain in left knee: Secondary | ICD-10-CM | POA: Diagnosis not present

## 2022-10-04 DIAGNOSIS — G8929 Other chronic pain: Secondary | ICD-10-CM

## 2022-10-04 DIAGNOSIS — R262 Difficulty in walking, not elsewhere classified: Secondary | ICD-10-CM

## 2022-10-04 DIAGNOSIS — M6281 Muscle weakness (generalized): Secondary | ICD-10-CM | POA: Diagnosis not present

## 2022-10-04 DIAGNOSIS — M25571 Pain in right ankle and joints of right foot: Secondary | ICD-10-CM | POA: Diagnosis not present

## 2022-10-04 NOTE — Therapy (Signed)
OUTPATIENT PHYSICAL THERAPY LOWER EXTREMITY TREATMENT NOTE   Patient Name: Mary Garcia MRN: [id number] DOB:[birth year], 77 y.o., female Today's Date: 10/04/2022  END OF SESSION:  PT End of Session - 10/04/22 0854     Visit Number 8    Number of Visits 16    Date for PT Re-Evaluation 10/17/22    Authorization Type BC[employer]    PT Start Time [phone]    PT Stop Time 0930    PT Time Calculation (min) 41 min    Activity Tolerance Patient tolerated treatment well    Behavior During Therapy WFL for tasks assessed/performed                  Past Medical History:  Diagnosis Date   Anxiety    Arthritis    Asthma    Bronchitis, acute    Past Surgical History:  Procedure Laterality Date   BREAST ENHANCEMENT SURGERY     CATARACT EXTRACTION W/ INTRAOCULAR LENS IMPLANT Bilateral    FACELIFT     local   ROTATOR CUFF REPAIR Right    TIBIA FRACTURE SURGERY  2004   left leg x3, tibia and fibula   TOTAL KNEE ARTHROPLASTY Left 10/18/2021   Procedure: TOTAL KNEE ARTHROPLASTY;  Surgeon: Vickey Huger, MD;  Location: [facility];  Service: Orthopedics;  Laterality: Left;   TUMOR EXCISION  2003   right muscle back, benign   Patient Active Problem List   Diagnosis Date Noted   Seasonal allergic rhinitis due to pollen 05/17/2021   Mild persistent asthma without complication [id number]   DDD (degenerative disc disease), cervical 09/15/2017   Depression 02/02/2016   Arthropathy of right shoulder 02/02/2016   Insomnia 04/13/2011   Osteoporosis 04/13/2011   Biliary dyskinesia 04/13/2011   Osteoarthritis 04/13/2011   History of migraine headaches 04/13/2011   Vitamin D deficiency 04/13/2011   Anxiety 04/13/2011   Irritable bowel syndrome 04/13/2011    PCP: Elby Showers, MD   REFERRING PROVIDER: Armond Hang, MD   REFERRING DIAG:  Achilles tendinitis, right leg [M76.61]   THERAPY DIAG:  Pain in right ankle and joints of right foot  Muscle weakness (generalized)  Difficulty in  walking, not elsewhere classified  Localized edema  Chronic pain of left knee  Rationale for Evaluation and Treatment: Rehabilitation  ONSET DATE: Started 02/11/22 and progressively worsened  SUBJECTIVE:   SUBJECTIVE STATEMENT:  I woke up at 5 AM with pain and I took Tylenol this morning.  I was a little achy after walking alittle over a mile.  1.2.  and I iced when I returned yesterday.  My husband wants to go to the [employer] MY son made my orthotics in my shoe and I don't like them.    PERTINENT HISTORY:  L  TKA 10-18-21 ,Osteoporosis 09-13-22  2/10  at end  1/10 09-27-22  1/10 10-04-22 at initiation 2/10  PAIN:  Are you having pain? Yes: NPRS scale: 0/10 Pain location: back of the ankle Pain description: burning, aching, sore, sharp / shoot.  Aggravating factors: any standing/ walking, descending steps Relieving factors: sitting/ resting  PRECAUTIONS: None  WEIGHT BEARING RESTRICTIONS: No  FALLS:  Has patient fallen in last 6 months? No  LIVING ENVIRONMENT: Lives with: lives with their spouse Lives in: House/apartment Stairs: Yes: Internal: 12 steps; on left going up and External: 2 steps; none Has following equipment at home:  [place]: Retired  PLOF: Independent  PATIENT GOALS: stop having  so much pain  NEXT MD VISIT:   OBJECTIVE:   DIAGNOSTIC FINDINGS: MRI at MD's office which pt provided report for Impression of r ankle MRI 07/11/2022 Severe distal achilles tendinosis with high-grade partial tearing of the tendon at its calcaneal insertion. No significant associated tendon retraction Mild to moderate retrocalcanel bursitis with associated with haglund defomrityof the posterior calcaneus.  Nondisplaced split tearing of the peroneus longus with retromalleolar groove and peroneal brevis near the level of the peroneal tubercle Subacute/chronic partial tearing of the ATFL. Additional suspected chronic partial  tearing/degenerationg of the superomedial of the calcanavicular spring ligament.  PATIENT SURVEYS:  FOTO 47% and predicted 61% 09/22/2024  65%  COGNITION: Overall cognitive status: Within functional limits for tasks assessed     SENSATION: WFL  EDEMA:  Figure 8: R 49 cm, 47.cm   1-23- 24  RT 45cm, LT 45.25 cm  POSTURE: rounded shoulders, forward head, and anterior pelvic tilt WNL   PALPATION: TTP along the posterior cancanela tubercle with associated swelling noted as a result of haglunds deformity. Multiple trigger points  in the R calf with specific soreness along the medial soleus.  LOWER EXTREMITY ROM:  Active/ passive ROM Right eval Left eval RT 09-27-22  Hip flexion     Hip extension     Hip abduction     Hip adduction     Hip internal rotation     Hip external rotation     Knee flexion     Knee extension     Ankle dorsiflexion A2/10P A9 A5/10P  Ankle plantarflexion 68 51   Ankle inversion [facility] WFL   Ankle eversion WFL WFL    (Blank rows = not tested)  LOWER EXTREMITY MMT:  MMT Right eval Left eval  Hip flexion    Hip extension    Hip abduction    Hip adduction    Hip internal rotation    Hip external rotation    Knee flexion    Knee extension    Ankle dorsiflexion 5+ 5+  Ankle plantarflexion 3+ P! 5+4  Ankle inversion 4+ 4+  Ankle eversion 4+ 4+  Posterior tibalis R 3=/5 P!    L 5 Peroneal brevis/ longus (4- with P!)  L 5   (Blank rows = not tested)  LOWER EXTREMITY SPECIAL TESTS:  N/A  FUNCTIONAL TESTS:  6 min walk test - 1669 ft outdoors   GAIT: Distance walked: 120 ft to tx room Assistive device utilized: None Level of assistance: Complete Independence Comments: decreased stride on RLE with antalgic pattern noted, with decreased toe off compared bil   TODAY'S TREATMENT:    [facility] Adult PT Treatment:                                                DATE: 10-04-22 Therapeutic Exercise: Elliptical 6 minutes for warm up and  subjective Gastroc/ solues stretch 2 x 30 sec ea on slant board Leg press BIl 45 #, RT side only 25 # 2 x 10 Leg press RT heel raise 15 # 3 x 10, then 2 x 10 with 25 # fatigues Step ups 3 x 10 on RT Sustained squat position doing lateral band walks. Using low table for reference with GTB. Manual Therapy: IASTM gastroc / solues and posterior tib.  Trigger Point Dry-Needling  Treatment instructions: Expect mild to moderate muscle soreness.  S/S of pneumothorax if dry needled over a lung field, and to seek immediate medical attention should they occur. Patient verbalized understanding of these instructions and education.  Patient Consent Given: No Education handout provided: Previously provided Muscles treated: peroneal longus, Lateral gastroc and posterior tib RT Electrical stimulation performed: No Parameters: N/A Treatment response/outcome: twitch response noted muscle lengthening.    [facility] Adult PT Treatment:                                                DATE: 09-27-22 Therapeutic Exercise:  Gastroc/ solues stretch 1 x 30 sec ea on slant board Dead lift  ladder 10 x with 15# KB, then 5 x with 25# KB then 3 x with 45#  Heel raises off end of step 1 x to fatigue, 1 con bil/ eccentric RLE to fatigue then progressing to bil LE calves  Step ups on 8 inch 12 x 2 on RT Heel raise SL on RT 10x 1/2 range 6 min walk test - 1669 ft outdoors (norm [id number]) Manual Therapy: Manual Therapy: IASTM gastroc / solues and posterior tib.  Trigger Point Dry-Needling  Treatment instructions: Expect mild to moderate muscle soreness. S/S of pneumothorax if dry needled over a lung field, and to seek immediate medical attention should they occur. Patient verbalized understanding of these instructions and education.  Patient Consent Given: No Education handout provided: Previously provided Muscles treated: peroneal longus, Lateral gastroc and posterior tib Electrical stimulation performed:  No Parameters: N/A Treatment response/outcome: twitch response noted muscle lengthening.   [facility] Adult PT Treatment:                                                DATE: 09/22/2022 Therapeutic Exercise: Elliptical L1 x 3 min ramp L1 Gastroc/ solues stretch 1 x 30 sec ea on slant board Dead lift 1 x with 15# KB, then 2 x with 25# KB going to fatigue Heel raises off end of step 1 x to fatigue, 1 con bil/ eccentric RLE to fatigue then progressing to bil LE calves  Sustained squat position doing lateral band walks. Using low table for reference with RTB.   Manual Therapy: IASTM gastroc / solues and posterior tib.  Trigger Point Dry-Needling  Treatment instructions: Expect mild to moderate muscle soreness. S/S of pneumothorax if dry needled over a lung field, and to seek immediate medical attention should they occur. Patient verbalized understanding of these instructions and education.  Patient Consent Given: No Education handout provided: Previously provided Muscles treated: peroneal longus, Lateral gastroc and posterior tib Electrical stimulation performed: No Parameters: N/A Treatment response/outcome: twitch response noted muscle lengthening.      [facility] Adult PT Treatment:                                                DATE: 09-13-22 Therapeutic Exercise: Pilates (red/green spring) knee ext through BIL mid foot for following exercise 2 x 10 Pilates reformer Wt bear  through balls of great foot  toes inward 2 x 10 Pilates reformer [employer] bear  through balls of great foot  toes outward2 x 10 Pilates SL  foot forward 2 x 10 Pilates SL  foot inward 2 x 10 Pilates SL  foot Outward 2 x 10 2 rounds of following RDL with 20 # 2 x 10 Seated Calf Raise with Weights RT Thighs On Step  with 20 lb 2 sets - 10 reps Deadlift with 45 # KB 2 x 5 Manual Therapy: DTW of gastroc/soleus  IASTYM Trigger Point Dry-Needling  Treatment instructions: Expect mild to moderate muscle soreness. S/S of  pneumothorax if dry needled over a lung field, and to seek immediate medical attention should they occur. Patient verbalized understanding of these instructions and education.  Patient Consent Given: Yes Education handout provided: Yes Muscles treated: RT gastroc/ soleus especially medial side Electrical stimulation performed: No Parameters: N/A Treatment response/outcome: twitch response . Palpable lengthening  [facility] Adult PT Treatment:                                                DATE: 09-06-22 Therapeutic Exercise:  Seated Calf Raise with Weights RT Thighs On Step  with 15 lb 3 sets - 10 reps RDL with 15 # 2 x 15 3 rounds of next  2  exercises Deadlift with 45 # KB 1 x 5 Heel raise BIL 1 x 15 Gastroc Stretch on Wall  2 reps - 30 sec hold Woodpeckers One Leg Anterior  RT 3 x 10 Woodpecker one Leg medial RT 3 x 10 Woodpecker one leg lateral RT 3 x 10 STS with 20# weight 3 x 10 Manual Therapy: Pt Acupuncture yesterday at another practitioner and had RT medial calf proximal bruising which was avoided today by PT DTW of gastroc/soleus  Trigger Point Dry-Needling  Treatment instructions: Expect mild to moderate muscle soreness. S/S of pneumothorax if dry needled over a lung field, and to seek immediate medical attention should they occur. Patient verbalized understanding of these instructions and education.  Patient Consent Given: Yes Education handout provided: Yes Muscles treated: RT gastroc/ soleus especially medial side Electrical stimulation performed: No Parameters: N/A Treatment response/outcome: twitch response . Palpable lengthening Modalities  Ice pack on RT gastroc/achilles   PATIENT EDUCATION:  Education details: Evaluation findings, POC, Goals, HEP with proper form/ rationale.  Person educated: Patient Education method: Explanation, Verbal cues, and Handouts Education comprehension: verbalized understanding  HOME EXERCISE PROGRAM: Access Code: [email or web address] URL:  [email or web address] Date: 08/30/2022 Prepared by: Voncille Lo  Exercises - Seated Calf Stretch with Strap  - 3 x daily - 7 x weekly - 2 sets - 2 reps - 30 sec hold - Gastroc Stretch on Wall  - 3 x daily - 7 x weekly - 2 sets - 2 reps - 30 sec hold - Ankle Plantar flexion  - 1 x daily - 2 sets - 10 reps - Woodpeckers One Leg Every OTHER day  - 1 x daily - 7 x weekly - 2-3 sets - 10 reps - Seated Calf Raise with Weights on Thighs On Step  - 1 x daily - 7 x weekly - 3 sets - 10 reps - Seated Ankle Eversion with Resistance  - 1 x daily - 7 x weekly - 3 sets - 10 reps - Seated Figure 4 Ankle Inversion with Resistance  - 1 x daily - 7 x weekly - 3 sets - 10 reps  ASSESSMENT:  CLINICAL IMPRESSION:  Pt arrives with 1-2 pain and still with SL weakness in RT LE.  Pt main goal is to achieve better strength for hiking and handling irregular /uneven ground for eventual nature hiking. Pt uses Tylenol to handle pain that AM and tolerates.Continued TPDN focusing on the R lateral gastroc head, peroneal longus and posterior tib followed with IASTM techniques. Continued working on gross LE activation utilizing compound exercises with focus on increased reps to maximize her endurance and overall stability.   OBJECTIVE IMPAIRMENTS: Abnormal gait, decreased activity tolerance, decreased balance, decreased endurance, decreased strength, increased edema, increased fascial restrictions, increased muscle spasms, improper body mechanics, postural dysfunction, and pain.   ACTIVITY LIMITATIONS: carrying, lifting, standing, squatting, and stairs  PARTICIPATION LIMITATIONS: driving, shopping, and community activity  PERSONAL FACTORS: Age, Past/current experiences, and 1 comorbidity: osteoporosis  are also affecting patient's functional outcome.   REHAB POTENTIAL: Good  CLINICAL DECISION MAKING: Evolving/moderate complexity  EVALUATION COMPLEXITY: Moderate   GOALS: Goals reviewed with  patient? Yes  SHORT TERM GOALS: Target date: 09/19/2022   Pt to be IND with initial HEP for therapeutic progression Baseline: Goal status: MET  2.  Pt to report max pain to </= 5/10 to demo improving condition Baseline: 09-06-22  at worst 4/10 at night Goal status: MET  3.  Pt to demonstrate efficient gait pattern with report of </= 5/10 pain to reduce potential proximal kinetic chain problems Baseline: 09-06-22  has slight antalgic gait but now negotiating steps up and down with alternating pattern 09-13-22   1/10 at end of RX  initial 2/10 Goal status: MET  4.  Pt to increase baseline FOTO score to >/= 56%  Baseline:  Goal status: MET 09/22/2022  5.  Reduce ankle swelling by >/= 1 cm to promote ankle mobility Baseline: RT 49, LT 47  09-06-22 RT 45cm, LT 45.25 cm Goal status:MET  LONG TERM GOALS: Target date: 10/17/2022  Increase R ankle DF to >/= 8 degrees with no report of pain during assessment to assist with toe clearance with gait.  Baseline:  Goal status: INITIAL  2.  Increase R ankle gross strength to >/= 4/5 with </=2.10 max pain during testing to demo improvement in ankle strength/ stability.  Baseline:  Goal status: ONGOING  3.  Increase FOTO score to >/= 61% to demo improvement in function Baseline: 09-22-22 65% Goal status MET  4.  Pt to report improvement in descending steps and heavy duty house chores/ ADLS with max pain of </= 2/10 pain Baseline: 65% Goal status: INITIAL  5.  Pt be able to peform 6 min walk test achieve appropriate age specific distance of ~1545 ft with </= 2/10 max pain Baseline: 1669 ft outdoors  2/10 Goal status: MET      PLAN:  PT FREQUENCY: 1-2x/week  PT DURATION: 8 weeks  PLANNED INTERVENTIONS: Therapeutic exercises, Therapeutic activity, Neuromuscular re-education, Balance training, Gait training, Patient/Family education, Self Care, Joint mobilization, Joint manipulation, Stair training, Aquatic Therapy, Dry Needling, Cryotherapy,  Moist heat, Taping, Vasopneumatic device, Ultrasound, Ionotophoresis 75m/ml Dexamethasone, Manual therapy, and Re-evaluation  PLAN FOR NEXT SESSION: review/ update HEP PRN. Response to TPDN for the R calf continue PRN, ankle strengthening both intrinsic/ extrinsic, gait training.   AROM measures. Plan to begin dropping down to 1 x a week then progress to 1 x every other week.  GOALS next weeks  LVoncille Lo PT, ASan [facility]Certified Exercise Expert for the Aging Adult  10/04/22 9:33 AM Phone: [phone]: [phone]

## 2022-10-05 NOTE — Therapy (Signed)
OUTPATIENT PHYSICAL THERAPY LOWER EXTREMITY TREATMENT NOTE   Patient Name: Mary Garcia MRN: [id number] DOB:1946-07-08, 77 y.o., female Today's Date: 10/06/2022  END OF SESSION:  PT End of Session - 10/06/22 1021     Visit Number 9    Number of Visits 16    Date for PT Re-Evaluation 10/17/22    Authorization Type [employer]    PT Start Time 1019    PT Stop Time 1100    PT Time Calculation (min) 41 min    Activity Tolerance Patient tolerated treatment well    Behavior During Therapy WFL for tasks assessed/performed                   Past Medical History:  Diagnosis Date   Anxiety    Arthritis    Asthma    Bronchitis, acute    Past Surgical History:  Procedure Laterality Date   BREAST ENHANCEMENT SURGERY     CATARACT EXTRACTION W/ INTRAOCULAR LENS IMPLANT Bilateral    FACELIFT     local   ROTATOR CUFF REPAIR Right    TIBIA FRACTURE SURGERY  2004   left leg x3, tibia and fibula   TOTAL KNEE ARTHROPLASTY Left 10/18/2021   Procedure: TOTAL KNEE ARTHROPLASTY;  Surgeon: Vickey Huger, MD;  Location: [facility];  Service: Orthopedics;  Laterality: Left;   TUMOR EXCISION  2003   right muscle back, benign   Patient Active Problem List   Diagnosis Date Noted   Seasonal allergic rhinitis due to pollen 05/17/2021   Mild persistent asthma without complication [id number]   DDD (degenerative disc disease), cervical 09/15/2017   Depression 02/02/2016   Arthropathy of right shoulder 02/02/2016   Insomnia 04/13/2011   Osteoporosis 04/13/2011   Biliary dyskinesia 04/13/2011   Osteoarthritis 04/13/2011   History of migraine headaches 04/13/2011   Vitamin D deficiency 04/13/2011   Anxiety 04/13/2011   Irritable bowel syndrome 04/13/2011    PCP: Elby Showers, MD   REFERRING PROVIDER: Armond Hang, MD   REFERRING DIAG:  Achilles tendinitis, right leg [M76.61]   THERAPY DIAG:  Pain in right ankle and joints of right foot  Muscle weakness (generalized)  Localized  edema  Chronic pain of left knee  Rationale for Evaluation and Treatment: Rehabilitation  ONSET DATE: Started 02/11/22 and progressively worsened  SUBJECTIVE:   SUBJECTIVE STATEMENT:Dr Balkind  has reduced me to 30 min a week now.  My swelling is significantly better. 0/10 pain. Last night I was having some real nerve pain after Dr Marlowe [place] needled but fine now.  PERTINENT HISTORY:  L  TKA 10-18-21 ,Osteoporosis 09-13-22  2/10  at end  1/10 09-27-22  1/10 10-04-22 at initiation 2/10  PAIN:  Are you having pain? Yes: NPRS scale: 0/10 Pain location: back of the ankle Pain description: burning, aching, sore, sharp / shoot.  Aggravating factors: any standing/ walking, descending steps Relieving factors: sitting/ resting  PRECAUTIONS: None  WEIGHT BEARING RESTRICTIONS: No  FALLS:  Has patient fallen in last 6 months? No  LIVING ENVIRONMENT: Lives with: lives with their spouse Lives in: House/apartment Stairs: Yes: Internal: 12 steps; on left going up and External: 2 steps; none Has following equipment at home:  [facility]   OCCUPATION: Retired  PLOF: Independent  PATIENT GOALS: stop having so much pain  NEXT MD VISIT:   OBJECTIVE:   DIAGNOSTIC FINDINGS: MRI at MD's office which pt provided report for Impression of r ankle MRI 07/11/2022 Severe distal achilles tendinosis with high-grade partial tearing  of the tendon at its calcaneal insertion. No significant associated tendon retraction Mild to moderate retrocalcanel bursitis with associated with haglund defomrityof the posterior calcaneus.  Nondisplaced split tearing of the peroneus longus with retromalleolar groove and peroneal brevis near the level of the peroneal tubercle Subacute/chronic partial tearing of the ATFL. Additional suspected chronic partial tearing/degenerationg of the superomedial of the calcanavicular spring ligament.  PATIENT SURVEYS:  FOTO 47% and predicted 61% 09/22/2024  65%  COGNITION: Overall cognitive  status: Within functional limits for tasks assessed     SENSATION: WFL  EDEMA:  Figure 8: R 49 cm, 47.cm   1-23- 24  RT 45cm, LT 45.25 cm  10-04-22 LT 44.25  POSTURE: rounded shoulders, forward head, and anterior pelvic tilt WNL   PALPATION: TTP along the posterior cancanela tubercle with associated swelling noted as a result of haglunds deformity. Multiple trigger points  in the R calf with specific soreness along the medial soleus.  LOWER EXTREMITY ROM:  Active/ passive ROM Right eval Left eval RT 09-27-22 RT 10-06-22  Hip flexion      Hip extension      Hip abduction      Hip adduction      Hip internal rotation      Hip external rotation      Knee flexion      Knee extension      Ankle dorsiflexion A2/10P A9 A5/10P A8/P11  Ankle plantarflexion 68 51    Ankle inversion [facility]    Ankle eversion WFL WFL     (Blank rows = not tested)  LOWER EXTREMITY MMT:  MMT Right eval Left eval RT 09-16-22 LT 10-06-22  Hip flexion      Hip extension      Hip abduction      Hip adduction      Hip internal rotation      Hip external rotation      Knee flexion      Knee extension      Ankle dorsiflexion 5+ 5+    Ankle plantarflexion 3+ P! 5+4 8/25 loses range 20/25  Ankle inversion 4+ 4+    Ankle eversion 4+ 4+    Posterior tibalis R 3=/5 P!    L 5 Peroneal brevis/ longus (4- with P!)  L 5   (Blank rows = not tested)  LOWER EXTREMITY SPECIAL TESTS:  N/A  FUNCTIONAL TESTS:  6 min walk test - 1669 ft outdoors   GAIT: Distance walked: 120 ft to tx room Assistive device utilized: None Level of assistance: Complete Independence Comments: decreased stride on RLE with antalgic pattern noted, with decreased toe off compared bil   TODAY'S TREATMENT:   [facility] Adult PT Treatment:                                                DATE: 10-06-22  Therapeutic Exercise: 3 way sitting soleus exercise 30 x  Dead lift  25 # x 15,  30# KB 1 x 10,  45# KB 1 x 5 Black TB DF/knee flexion  on step 3 way IR/ER/Forward 30 x with PT assist with AP pull Heel raises off end of step 1 x to fatigue, 1 con bil/ eccentric RLE to fatigue then progressing to bil LE calves  Step ups on 8 inch 12 x 2 on RT Sustained squat position doing  lateral band walks. Using low table for reference with GTB. Manual Therapy: Ankle mobs talar and subtalar to improve DF grade 3   [facility] Adult PT Treatment:                                                DATE: 10-04-22 Therapeutic Exercise: Elliptical 6 minutes for warm up and subjective Gastroc/ solues stretch 2 x 30 sec ea on slant board Leg press BIl 45 #, RT side only 25 # 2 x 10 Leg press RT heel raise 15 # 3 x 10, then 2 x 10 with 25 # fatigues Step ups 3 x 10 on RT Sustained squat position doing lateral band walks. Using low table for reference with GTB. Manual Therapy: IASTM gastroc / solues and posterior tib.  Trigger Point Dry-Needling  Treatment instructions: Expect mild to moderate muscle soreness. S/S of pneumothorax if dry needled over a lung field, and to seek immediate medical attention should they occur. Patient verbalized understanding of these instructions and education.  Patient Consent Given: No Education handout provided: Previously provided Muscles treated: peroneal longus, Lateral gastroc and posterior tib RT Electrical stimulation performed: No Parameters: N/A Treatment response/outcome: twitch response noted muscle lengthening.    [facility] Adult PT Treatment:                                                DATE: 09-27-22 Therapeutic Exercise:  Gastroc/ solues stretch 1 x 30 sec ea on slant board Dead lift  ladder 10 x with 15# KB, then 5 x with 25# KB then 3 x with 45#  Heel raises off end of step 1 x to fatigue, 1 con bil/ eccentric RLE to fatigue then progressing to bil LE calves  Step ups on 8 inch 12 x 2 on RT Heel raise SL on RT 10x 1/2 range 6 min walk test - 1669 ft outdoors (norm [id number]) Manual Therapy: Manual  Therapy: IASTM gastroc / solues and posterior tib.  Trigger Point Dry-Needling  Treatment instructions: Expect mild to moderate muscle soreness. S/S of pneumothorax if dry needled over a lung field, and to seek immediate medical attention should they occur. Patient verbalized understanding of these instructions and education.  Patient Consent Given: No Education handout provided: Previously provided Muscles treated: peroneal longus, Lateral gastroc and posterior tib Electrical stimulation performed: No Parameters: N/A Treatment response/outcome: twitch response noted muscle lengthening.   [place] Adult PT Treatment:                                                DATE: 09/22/2022 Therapeutic Exercise: Elliptical L1 x 3 min ramp L1 Gastroc/ solues stretch 1 x 30 sec ea on slant board Dead lift 1 x with 15# KB, then 2 x with 25# KB going to fatigue Heel raises off end of step 1 x to fatigue, 1 con bil/ eccentric RLE to fatigue then progressing to bil LE calves  Sustained squat position doing lateral band walks. Using low table for reference with RTB.   Manual Therapy: IASTM gastroc / solues and posterior tib.  Trigger Point Dry-Needling  Treatment instructions: Expect mild to moderate muscle soreness. S/S of pneumothorax if dry needled over a lung field, and to seek immediate medical attention should they occur. Patient verbalized understanding of these instructions and education.  Patient Consent Given: No Education handout provided: Previously provided Muscles treated: peroneal longus, Lateral gastroc and posterior tib Electrical stimulation performed: No Parameters: N/A Treatment response/outcome: twitch response noted muscle lengthening.      [facility] Adult PT Treatment:                                                DATE: 09-13-22 Therapeutic Exercise: Pilates (red/green spring) knee ext through BIL mid foot for following exercise 2 x 10 Pilates reformer Wt bear  through balls of  great foot  toes inward 2 x 10 Pilates reformer [employer] bear  through balls of great foot  toes outward2 x 10 Pilates SL  foot forward 2 x 10 Pilates SL  foot inward 2 x 10 Pilates SL  foot Outward 2 x 10 2 rounds of following RDL with 20 # 2 x 10 Seated Calf Raise with Weights RT Thighs On Step  with 20 lb 2 sets - 10 reps Deadlift with 45 # KB 2 x 5 Manual Therapy: DTW of gastroc/soleus  IASTYM Trigger Point Dry-Needling  Treatment instructions: Expect mild to moderate muscle soreness. S/S of pneumothorax if dry needled over a lung field, and to seek immediate medical attention should they occur. Patient verbalized understanding of these instructions and education.  Patient Consent Given: Yes Education handout provided: Yes Muscles treated: RT gastroc/ soleus especially medial side Electrical stimulation performed: No Parameters: N/A Treatment response/outcome: twitch response . Palpable lengthening  [facility] Adult PT Treatment:                                                DATE: 09-06-22 Therapeutic Exercise:  Seated Calf Raise with Weights RT Thighs On Step  with 15 lb 3 sets - 10 reps RDL with 15 # 2 x 15 3 rounds of next  2  exercises Deadlift with 45 # KB 1 x 5 Heel raise BIL 1 x 15 Gastroc Stretch on Wall  2 reps - 30 sec hold Woodpeckers One Leg Anterior  RT 3 x 10 Woodpecker one Leg medial RT 3 x 10 Woodpecker one leg lateral RT 3 x 10 STS with 20# weight 3 x 10 Manual Therapy: Pt Acupuncture yesterday at another practitioner and had RT medial calf proximal bruising which was avoided today by PT DTW of gastroc/soleus  Trigger Point Dry-Needling  Treatment instructions: Expect mild to moderate muscle soreness. S/S of pneumothorax if dry needled over a lung field, and to seek immediate medical attention should they occur. Patient verbalized understanding of these instructions and education.  Patient Consent Given: Yes Education handout provided: Yes Muscles treated: RT  gastroc/ soleus especially medial side Electrical stimulation performed: No Parameters: N/A Treatment response/outcome: twitch response . Palpable lengthening Modalities  Ice pack on RT gastroc/achilles   PATIENT EDUCATION:  Education details: Evaluation findings, POC, Goals, HEP with proper form/ rationale.  Person educated: Patient Education method: Explanation, Verbal cues, and Handouts Education comprehension: verbalized understanding  HOME EXERCISE PROGRAM: Access  Code: [email or web address] URL: [email or web address] Date: 10/06/2022 Prepared by: Voncille Lo  Program Notes 1) 3 way soleus heel raise 25 # weight in sitting.  Super important2) Do the black Theraband around ankle and do knee flexion and Dorsiflexion of ankle on step  3 way skier 30 x   Exercises - Seated Calf Stretch with Strap  - 3 x daily - 7 x weekly - 2 sets - 2 reps - 30 sec hold - Gastroc Stretch on Wall  - 3 x daily - 7 x weekly - 2 sets - 2 reps - 30 sec hold - Ankle Plantar flexion  - 1 x daily - 2 sets - 10 reps - Woodpeckers One Leg Every OTHER day  - 1 x daily - 7 x weekly - 2-3 sets - 10 reps - Seated Calf Raise with Weights on Thighs On Step  - 1 x daily - 7 x weekly - 3 sets - 10 reps - Seated Ankle Eversion with Resistance  - 1 x daily - 7 x weekly - 3 sets - 10 reps - Seated Figure 4 Ankle Inversion with Resistance  - 1 x daily - 7 x weekly - 3 sets - 10 reps  ASSESSMENT:  CLINICAL IMPRESSION:  Pt arrives with 0/10 pain and still with SL weakness in RT LE. Pt AROM  RT DF 6 and PROM DF 11 today after exercise. Pt  HEP updated to emphasize DF Range and LE strength especially gastroc soleus muscles today.  Pt  plantarflexion strength for heel raise indicates 8/25 reps on RT compared to 20/25 on LT showing significant weakness in RT.  circumferential edema shows great improvement from eval 49 cm to 44.25 cm.  Pt main goal is to achieve better strength for hiking and handling irregular /uneven  ground for eventual nature hiking.  Continue working on gross LE activation utilizing compound exercises with focus on increased reps to maximize her endurance and overall stability.   OBJECTIVE IMPAIRMENTS: Abnormal gait, decreased activity tolerance, decreased balance, decreased endurance, decreased strength, increased edema, increased fascial restrictions, increased muscle spasms, improper body mechanics, postural dysfunction, and pain.   ACTIVITY LIMITATIONS: carrying, lifting, standing, squatting, and stairs  PARTICIPATION LIMITATIONS: driving, shopping, and community activity  PERSONAL FACTORS: Age, Past/current experiences, and 1 comorbidity: osteoporosis  are also affecting patient's functional outcome.   REHAB POTENTIAL: Good  CLINICAL DECISION MAKING: Evolving/moderate complexity  EVALUATION COMPLEXITY: Moderate   GOALS: Goals reviewed with patient? Yes  SHORT TERM GOALS: Target date: 09/19/2022   Pt to be IND with initial HEP for therapeutic progression Baseline: Goal status: MET  2.  Pt to report max pain to </= 5/10 to demo improving condition Baseline: 09-06-22  at worst 4/10 at night Goal status: MET  3.  Pt to demonstrate efficient gait pattern with report of </= 5/10 pain to reduce potential proximal kinetic chain problems Baseline: 09-06-22  has slight antalgic gait but now negotiating steps up and down with alternating pattern 09-13-22   1/10 at end of RX  initial 2/10 Goal status: MET  4.  Pt to increase baseline FOTO score to >/= 56%  Baseline:  Goal status: MET 09/22/2022  5.  Reduce ankle swelling by >/= 1 cm to promote ankle mobility Baseline: RT 49, LT 47  09-06-22 RT 45cm, LT 45.25 cm Goal status:MET  LONG TERM GOALS: Target date: 10/17/2022  Increase R ankle DF to >/= 8 degrees with no report of pain during assessment to assist with toe clearance with  gait.  Baseline: 10-06-22 RT DF 6 PROM 11 Goal status:ONGOING  2.  Increase R ankle gross strength to  >/= 4/5 with </=2.10 max pain during testing to demo improvement in ankle strength/ stability.  Baseline: PF RT 8/25 and LT 20/25 Goal status: ONGOING  3.  Increase FOTO score to >/= 61% to demo improvement in function Baseline: 09-22-22 65% Goal status MET  4.  Pt to report improvement in descending steps and heavy duty house chores/ ADLS with max pain of </= 2/10 pain Baseline: 10-06-22  improving steps but not greater than 8 inches in prep for hiking  Goal status: ONGOING  5.  Pt be able to peform 6 min walk test achieve appropriate age specific distance of ~1545 ft with </= 2/10 max pain Baseline: 1669 ft outdoors  2/10 Goal status: MET      PLAN:  PT FREQUENCY: 1-2x/week  PT DURATION: 8 weeks  PLANNED INTERVENTIONS: Therapeutic exercises, Therapeutic activity, Neuromuscular re-education, Balance training, Gait training, Patient/Family education, Self Care, Joint mobilization, Joint manipulation, Stair training, Aquatic Therapy, Dry Needling, Cryotherapy, Moist heat, Taping, Vasopneumatic device, Ultrasound, Ionotophoresis 9m/ml Dexamethasone, Manual therapy, and Re-evaluation  PLAN FOR NEXT SESSION: review/ update HEP PRN. Response to TPDN for the R calf continue PRN, ankle strengthening both intrinsic/ extrinsic, gait training.   AROM measures. Plan to begin dropping down to 1 x a week then progress to 1 x every other week.  GOALS next weeks  LVoncille Lo PT, ASt Lukes Behavioral HospitalCertified Exercise Expert for the Aging Adult  10/06/22 11:04 AM Phone: [phone]: [phone]

## 2022-10-06 ENCOUNTER — Ambulatory Visit: Payer: Medicare Other | Admitting: Physical Therapy

## 2022-10-06 ENCOUNTER — Other Ambulatory Visit: Payer: Self-pay

## 2022-10-06 ENCOUNTER — Encounter: Payer: Self-pay | Admitting: Physical Therapy

## 2022-10-06 DIAGNOSIS — M6281 Muscle weakness (generalized): Secondary | ICD-10-CM

## 2022-10-06 DIAGNOSIS — R600 Localized edema: Secondary | ICD-10-CM

## 2022-10-06 DIAGNOSIS — M25571 Pain in right ankle and joints of right foot: Secondary | ICD-10-CM | POA: Diagnosis not present

## 2022-10-06 DIAGNOSIS — G8929 Other chronic pain: Secondary | ICD-10-CM

## 2022-10-06 DIAGNOSIS — M25562 Pain in left knee: Secondary | ICD-10-CM | POA: Diagnosis not present

## 2022-10-06 DIAGNOSIS — R262 Difficulty in walking, not elsewhere classified: Secondary | ICD-10-CM | POA: Diagnosis not present

## 2022-10-06 NOTE — Patient Instructions (Addendum)
1) 3 way soleus heel raise 25 # weight in sitting.  Super important 2) Do the black Theraband around ankle and do knee flexion and Dorsiflexion of ankle on step  3 way skier 30 x   Voncille Lo, PT, [facility] Certified Exercise Expert for the Aging Adult  10/06/22 10:36 AM Phone: [phone] Fax: [phone]

## 2022-10-06 NOTE — Therapy (Signed)
OUTPATIENT PHYSICAL THERAPY LOWER EXTREMITY TREATMENT NOTE/ ERO Progress Note Reporting Period 08-22-2022 to 10-10-22  See note below for Objective Data and Assessment of Progress/Goals.       Patient Name: Mary Garcia MRN: [id number] DOB:1946-06-27, 77 y.o., female Today's Date: 10/11/2022  END OF SESSION:  PT End of Session - 10/11/22 0852     Visit Number 10    Number of Visits 18    Date for PT Re-Evaluation 12/06/22    Authorization Type BC[employer]    PT Start Time [phone]    PT Stop Time 0926    PT Time Calculation (min) 40 min    Activity Tolerance Patient tolerated treatment well    Behavior During Therapy WFL for tasks assessed/performed                    Past Medical History:  Diagnosis Date   Anxiety    Arthritis    Asthma    Bronchitis, acute    Past Surgical History:  Procedure Laterality Date   BREAST ENHANCEMENT SURGERY     CATARACT EXTRACTION W/ INTRAOCULAR LENS IMPLANT Bilateral    FACELIFT     local   ROTATOR CUFF REPAIR Right    TIBIA FRACTURE SURGERY  2004   left leg x3, tibia and fibula   TOTAL KNEE ARTHROPLASTY Left 10/18/2021   Procedure: TOTAL KNEE ARTHROPLASTY;  Surgeon: Vickey Huger, MD;  Location: [facility];  Service: Orthopedics;  Laterality: Left;   TUMOR EXCISION  2003   right muscle back, benign   Patient Active Problem List   Diagnosis Date Noted   Seasonal allergic rhinitis due to pollen 05/17/2021   Mild persistent asthma without complication [id number]   DDD (degenerative disc disease), cervical 09/15/2017   Depression 02/02/2016   Arthropathy of right shoulder 02/02/2016   Insomnia 04/13/2011   Osteoporosis 04/13/2011   Biliary dyskinesia 04/13/2011   Osteoarthritis 04/13/2011   History of migraine headaches 04/13/2011   Vitamin D deficiency 04/13/2011   Anxiety 04/13/2011   Irritable bowel syndrome 04/13/2011    PCP: Elby Showers, MD   REFERRING PROVIDER: Armond Hang, MD   REFERRING DIAG:  Achilles  tendinitis, right leg [M76.61]   THERAPY DIAG:  Pain in right ankle and joints of right foot - Plan: PT plan of care cert/re-cert  Muscle weakness (generalized) - Plan: PT plan of care cert/re-cert  Chronic pain of left knee - Plan: PT plan of care cert/re-cert  Difficulty in walking, not elsewhere classified - Plan: PT plan of care cert/re-cert  Rationale for Evaluation and Treatment: Rehabilitation  ONSET DATE: Started 02/11/22 and progressively worsened  SUBJECTIVE:   SUBJECTIVE STATEMENT:  I walked 1.25 miles yesterday and worked out with trainer.     PERTINENT HISTORY:  L  TKA 10-18-21 ,Osteoporosis 09-13-22  2/10  at end  1/10 09-27-22  1/10 10-04-22 at initiation 2/10 10-11-22  0/10  PAIN:  Are you having pain? Yes: NPRS scale: 0/10 Pain location: back of the ankle Pain description: burning, aching, sore, sharp / shoot.  Aggravating factors: any standing/ walking, descending steps Relieving factors: sitting/ resting  PRECAUTIONS: None  WEIGHT BEARING RESTRICTIONS: No  FALLS:  Has patient fallen in last 6 months? No  LIVING ENVIRONMENT: Lives with: lives with their spouse Lives in: House/apartment Stairs: Yes: Internal: 12 steps; on left going up and External: 2 steps; none Has following equipment at home:  [place]: Retired  PLOF: Independent  PATIENT GOALS: stop  having so much pain  NEXT MD VISIT:   OBJECTIVE:   DIAGNOSTIC FINDINGS: MRI at MD's office which pt provided report for Impression of r ankle MRI 07/11/2022 Severe distal achilles tendinosis with high-grade partial tearing of the tendon at its calcaneal insertion. No significant associated tendon retraction Mild to moderate retrocalcanel bursitis with associated with haglund defomrityof the posterior calcaneus.  Nondisplaced split tearing of the peroneus longus with retromalleolar groove and peroneal brevis near the level of the peroneal tubercle Subacute/chronic partial tearing of the  ATFL. Additional suspected chronic partial tearing/degenerationg of the superomedial of the calcanavicular spring ligament.  PATIENT SURVEYS:  FOTO 47% and predicted 61% 09/22/2024  65%  COGNITION: Overall cognitive status: Within functional limits for tasks assessed     SENSATION: WFL  EDEMA:  Figure 8: R 49 cm, 47.cm   1-23- 24  RT 45cm, LT 45.25 cm  10-04-22 LT 44.25  POSTURE: rounded shoulders, forward head, and anterior pelvic tilt WNL   PALPATION: TTP along the posterior cancanela tubercle with associated swelling noted as a result of haglunds deformity. Multiple trigger points  in the R calf with specific soreness along the medial soleus.  LOWER EXTREMITY ROM:  Active/ passive ROM Right eval Left eval RT 09-27-22 RT 10-06-22 RT 10-11-22  Hip flexion       Hip extension       Hip abduction       Hip adduction       Hip internal rotation       Hip external rotation       Knee flexion       Knee extension       Ankle dorsiflexion A2/10P A9 A5/10P A8/P11 A8/P11  Ankle plantarflexion 68 51     Ankle inversion [facility]     Ankle eversion WFL WFL      (Blank rows = not tested)  LOWER EXTREMITY MMT:  MMT Right eval Left eval RT 09-16-22 LT 10-06-22 RT/LT 10-11-22  Hip flexion       Hip extension       Hip abduction       Hip adduction       Hip internal rotation       Hip external rotation       Knee flexion       Knee extension       Ankle dorsiflexion 5+ 5+     Ankle plantarflexion 3+ P! 5+4 8/25 loses range 20/25 9/20 out of 25  Ankle inversion 4+ 4+   4+  Ankle eversion 4+ 4+   4+  Posterior tibalis R 3=/5 P!    L 5 Peroneal brevis/ longus (4- with P!)  L 5   (Blank rows = not tested)  LOWER EXTREMITY SPECIAL TESTS:  N/A  FUNCTIONAL TESTS:  6 min walk test - 1669 ft outdoors   GAIT: Distance walked: 120 ft to tx room Assistive device utilized: None Level of assistance: Complete Independence Comments: decreased stride on RLE with antalgic pattern  noted, with decreased toe off compared bil   TODAY'S TREATMENT:   [facility] Adult PT Treatment:                                                DATE: 10-11-22  Therapeutic Exercise: 3 way sitting soleus exercise 30 x  Leg press BIL 3 plates  Leg press RT LE 2 plates 2 x 15 Leg press SL R 1 plate with 12 x heel raise Kickstand dead lift on RT with 20 # DB Purple Powerband DF/knee flexion on step 3 way IR/ER/Forward 30 x with PT assist with AP pull Manual Therapy: IASTM gastroc / solues and posterior tib. Trigger Point Dry-Needling  Treatment instructions: Expect mild to moderate muscle soreness. S/S of pneumothorax if dry needled over a lung field, and to seek immediate medical attention should they occur. Patient verbalized understanding of these instructions and education.  Patient Consent Given: No Education handout provided: Previously provided Muscles treated: peroneal longus, Lateral gastroc and posterior tib RT Electrical stimulation performed: No Parameters: N/A Treatment response/outcome: twitch response noted muscle lengthening.   [facility] Adult PT Treatment:                                                DATE: 10-06-22  Therapeutic Exercise: 3 way sitting soleus exercise 30 x  Dead lift  25 # x 15,  30# KB 1 x 10,  45# KB 1 x 5 Black TB DF/knee flexion on step 3 way IR/ER/Forward 30 x with PT assist with AP pull Heel raises off end of step 1 x to fatigue, 1 con bil/ eccentric RLE to fatigue then progressing to bil LE calves  Step ups on 8 inch 12 x 2 on RT Sustained squat position doing lateral band walks. Using low table for reference with GTB. Manual Therapy: Ankle mobs talar and subtalar to improve DF grade 3   [facility] Adult PT Treatment:                                                DATE: 10-04-22 Therapeutic Exercise: Elliptical 6 minutes for warm up and subjective Gastroc/ solues stretch 2 x 30 sec ea on slant board Leg press BIl 45 #, RT side only 25 # 2 x 10 Leg press RT  heel raise 15 # 3 x 10, then 2 x 10 with 25 # fatigues Step ups 3 x 10 on RT Sustained squat position doing lateral band walks. Using low table for reference with GTB. Manual Therapy: IASTM gastroc / solues and posterior tib.  Trigger Point Dry-Needling  Treatment instructions: Expect mild to moderate muscle soreness. S/S of pneumothorax if dry needled over a lung field, and to seek immediate medical attention should they occur. Patient verbalized understanding of these instructions and education.  Patient Consent Given: No Education handout provided: Previously provided Muscles treated: peroneal longus, Lateral gastroc and posterior tib RT Electrical stimulation performed: No Parameters: N/A Treatment response/outcome: twitch response noted muscle lengthening.    [facility] Adult PT Treatment:                                                DATE: 09-27-22 Therapeutic Exercise:  Gastroc/ solues stretch 1 x 30 sec ea on slant board Dead lift  ladder 10 x with 15# KB, then 5 x with 25# KB then 3 x with 45#  Heel raises off end of step 1 x to  fatigue, 1 con bil/ eccentric RLE to fatigue then progressing to bil LE calves  Step ups on 8 inch 12 x 2 on RT Heel raise SL on RT 10x 1/2 range 6 min walk test - 1669 ft outdoors (norm [id number]) Manual Therapy: Manual Therapy: IASTM gastroc / solues and posterior tib.  Trigger Point Dry-Needling  Treatment instructions: Expect mild to moderate muscle soreness. S/S of pneumothorax if dry needled over a lung field, and to seek immediate medical attention should they occur. Patient verbalized understanding of these instructions and education.  Patient Consent Given: No Education handout provided: Previously provided Muscles treated: peroneal longus, Lateral gastroc and posterior tib Electrical stimulation performed: No Parameters: N/A Treatment response/outcome: twitch response noted muscle lengthening.   [place] Adult PT Treatment:                                                 DATE: 09/22/2022 Therapeutic Exercise: Elliptical L1 x 3 min ramp L1 Gastroc/ solues stretch 1 x 30 sec ea on slant board Dead lift 1 x with 15# KB, then 2 x with 25# KB going to fatigue Heel raises off end of step 1 x to fatigue, 1 con bil/ eccentric RLE to fatigue then progressing to bil LE calves  Sustained squat position doing lateral band walks. Using low table for reference with RTB.   Manual Therapy: IASTM gastroc / solues and posterior tib.  Trigger Point Dry-Needling  Treatment instructions: Expect mild to moderate muscle soreness. S/S of pneumothorax if dry needled over a lung field, and to seek immediate medical attention should they occur. Patient verbalized understanding of these instructions and education.  Patient Consent Given: No Education handout provided: Previously provided Muscles treated: peroneal longus, Lateral gastroc and posterior tib Electrical stimulation performed: No Parameters: N/A Treatment response/outcome: twitch response noted muscle lengthening.       PATIENT EDUCATION:  Education details: Evaluation findings, POC, Goals, HEP with proper form/ rationale.  Person educated: Patient Education method: Explanation, Verbal cues, and Handouts Education comprehension: verbalized understanding  HOME EXERCISE PROGRAM: Access Code: [email or web address] URL: [email or web address] Date: 10/06/2022 Prepared by: Voncille Lo  Program Notes 1) 3 way soleus heel raise 25 # weight in sitting.  Super important2) Do the black Theraband around ankle and do knee flexion and Dorsiflexion of ankle on step  3 way skier 30 x   Exercises - Seated Calf Stretch with Strap  - 3 x daily - 7 x weekly - 2 sets - 2 reps - 30 sec hold - Gastroc Stretch on Wall  - 3 x daily - 7 x weekly - 2 sets - 2 reps - 30 sec hold - Ankle Plantar flexion  - 1 x daily - 2 sets - 10 reps - Woodpeckers One Leg Every OTHER day  - 1 x daily - 7 x weekly - 2-3  sets - 10 reps - Seated Calf Raise with Weights on Thighs On Step  - 1 x daily - 7 x weekly - 3 sets - 10 reps - Seated Ankle Eversion with Resistance  - 1 x daily - 7 x weekly - 3 sets - 10 reps - Seated Figure 4 Ankle Inversion with Resistance  - 1 x daily - 7 x weekly - 3 sets - 10 reps  ASSESSMENT:  CLINICAL IMPRESSION:  Pt arrives with 0/10 pain and still  with SL weakness in RT LE. Pt AROM  RT DF 8 and PROM DF 11 today after exercise. Pt is doing better with endurance and was able to walk 1 .25 miles before pain yesterday.  Pt would benefit from improving endurance with RT LE SL strength.  Pt still demonstrates profound weakness in RT LE PF as compared to LT.   Pt  plantarflexion strength for heel raise indicates 9/25 reps on RT compared to 20/25 on LT showing significant weakness in RT.  Pt main goal is to achieve better strength for hiking and handling irregular /uneven ground for eventual nature hiking.  Continue working on gross LE activation utilizing compound exercises with focus on increased reps to maximize her endurance and overall stability. Pt would benefit from extension of skilled PT to maximize strength and to return to active lifestyle  OBJECTIVE IMPAIRMENTS: Abnormal gait, decreased activity tolerance, decreased balance, decreased endurance, decreased strength, increased edema, increased fascial restrictions, increased muscle spasms, improper body mechanics, postural dysfunction, and pain.   ACTIVITY LIMITATIONS: carrying, lifting, standing, squatting, and stairs  PARTICIPATION LIMITATIONS: driving, shopping, and community activity  PERSONAL FACTORS: Age, Past/current experiences, and 1 comorbidity: osteoporosis  are also affecting patient's functional outcome.   REHAB POTENTIAL: Good  CLINICAL DECISION MAKING: Evolving/moderate complexity  EVALUATION COMPLEXITY: Moderate   GOALS: Goals reviewed with patient? Yes  SHORT TERM GOALS: Target date: 09/19/2022   Pt to be  IND with initial HEP for therapeutic progression Baseline: Goal status: MET  2.  Pt to report max pain to </= 5/10 to demo improving condition Baseline: 09-06-22  at worst 4/10 at night Goal status: MET  3.  Pt to demonstrate efficient gait pattern with report of </= 5/10 pain to reduce potential proximal kinetic chain problems Baseline: 09-06-22  has slight antalgic gait but now negotiating steps up and down with alternating pattern 09-13-22   1/10 at end of RX  initial 2/10 Goal status: MET  4.  Pt to increase baseline FOTO score to >/= 56%  Baseline:  Goal status: MET 09/22/2022  5.  Reduce ankle swelling by >/= 1 cm to promote ankle mobility Baseline: RT 49, LT 47  09-06-22 RT 45cm, LT 45.25 cm Goal status:MET  LONG TERM GOALS: Target date: 10/17/2022 Revised for 12-06-22  Increase R ankle DF to >/= 8 degrees with no report of pain during assessment to assist with toe clearance with gait.  Baseline: 10-06-22 RT DF 6 PROM 11 10-11-22 8 AROM Goal status:MET  2.  Increase R ankle gross strength to >/= 4/5 with </=2.10 max pain during testing to demo improvement in ankle strength/ stability.  Baseline:  Goal status: ONGOING  3.  Increase FOTO score to >/= 61% to demo improvement in function Baseline: 09-22-22 65% Goal status MET  4.  Pt to report improvement in descending steps and heavy duty house chores/ ADLS with max pain of </= 2/10 pain Baseline: 10-06-22  improving steps but not greater than 8 inches in prep for hiking  10-11-22  Goal status: MET  5.  Pt be able to peform 6 min walk test achieve appropriate age specific distance of ~1545 ft with </= 2/10 max pain Baseline: 1669 ft outdoors  2/10 Goal status: MET       6. Pt will be able to walk 2 miles without exacerbating pain so she can be able to hike with family    Baseline 1.25 mile    Goal status: INITIAL    7. Pt  will be able to perform standing SL heel raise on RT 20/25 x    Baseline 8/25 with poor form    Goal Status;  INTIAL   PLAN:  PT FREQUENCY: 1-2x/week  PT DURATION: 8 weeks    PLANNED INTERVENTIONS: Therapeutic exercises, Therapeutic activity, Neuromuscular re-education, Balance training, Gait training, Patient/Family education, Self Care, Joint mobilization, Joint manipulation, Stair training, Aquatic Therapy, Dry Needling, Cryotherapy, Moist heat, Taping, Vasopneumatic device, Ultrasound, Ionotophoresis '4mg'$ /ml Dexamethasone, Manual therapy, and Re-evaluation  PLAN FOR NEXT SESSION: review/ update HEP PRN. Response to TPDN for the R calf continue PRN, ankle strengthening both intrinsic/ extrinsic, gait training.   AROM measures. Pt will continue 1 x a week for additional 6 weeks to increase SL stance strength and increase to 2 mile walking for preparation of hiking  Voncille Lo, PT, [facility] Certified Exercise Expert for the Aging Adult  10/11/22 1:05 PM Phone: [phone] Fax: [phone]

## 2022-10-11 ENCOUNTER — Encounter: Payer: Self-pay | Admitting: Physical Therapy

## 2022-10-11 ENCOUNTER — Other Ambulatory Visit: Payer: Self-pay

## 2022-10-11 ENCOUNTER — Ambulatory Visit: Payer: Medicare Other | Admitting: Physical Therapy

## 2022-10-11 DIAGNOSIS — R262 Difficulty in walking, not elsewhere classified: Secondary | ICD-10-CM | POA: Diagnosis not present

## 2022-10-11 DIAGNOSIS — M25562 Pain in left knee: Secondary | ICD-10-CM | POA: Diagnosis not present

## 2022-10-11 DIAGNOSIS — M25571 Pain in right ankle and joints of right foot: Secondary | ICD-10-CM

## 2022-10-11 DIAGNOSIS — G8929 Other chronic pain: Secondary | ICD-10-CM

## 2022-10-11 DIAGNOSIS — M6281 Muscle weakness (generalized): Secondary | ICD-10-CM

## 2022-10-11 DIAGNOSIS — R600 Localized edema: Secondary | ICD-10-CM | POA: Diagnosis not present

## 2022-10-13 ENCOUNTER — Encounter: Payer: Self-pay | Admitting: Physical Therapy

## 2022-10-13 ENCOUNTER — Ambulatory Visit: Payer: Medicare Other | Admitting: Physical Therapy

## 2022-10-13 DIAGNOSIS — R600 Localized edema: Secondary | ICD-10-CM | POA: Diagnosis not present

## 2022-10-13 DIAGNOSIS — M25571 Pain in right ankle and joints of right foot: Secondary | ICD-10-CM

## 2022-10-13 DIAGNOSIS — G8929 Other chronic pain: Secondary | ICD-10-CM

## 2022-10-13 DIAGNOSIS — R262 Difficulty in walking, not elsewhere classified: Secondary | ICD-10-CM | POA: Diagnosis not present

## 2022-10-13 DIAGNOSIS — M6281 Muscle weakness (generalized): Secondary | ICD-10-CM

## 2022-10-13 DIAGNOSIS — M25562 Pain in left knee: Secondary | ICD-10-CM | POA: Diagnosis not present

## 2022-10-13 NOTE — Therapy (Signed)
OUTPATIENT PHYSICAL THERAPY LOWER EXTREMITY TREATMENT NOTE/ ERO   Patient Name: Mary Garcia MRN: [id number] DOB:1945/12/31, 77 y.o., female Today's Date: 10/13/2022  END OF SESSION:  PT End of Session - 10/13/22 0932     Visit Number 11    Number of Visits 18    Date for PT Re-Evaluation 12/06/22    Authorization Type BC[employer]    Progress Note Due on Visit 20    PT Start Time 0932    PT Stop Time 1017    PT Time Calculation (min) 45 min    Activity Tolerance Patient tolerated treatment well                     Past Medical History:  Diagnosis Date   Anxiety    Arthritis    Asthma    Bronchitis, acute    Past Surgical History:  Procedure Laterality Date   BREAST ENHANCEMENT SURGERY     CATARACT EXTRACTION W/ INTRAOCULAR LENS IMPLANT Bilateral    FACELIFT     local   ROTATOR CUFF REPAIR Right    TIBIA FRACTURE SURGERY  2004   left leg x3, tibia and fibula   TOTAL KNEE ARTHROPLASTY Left 10/18/2021   Procedure: TOTAL KNEE ARTHROPLASTY;  Surgeon: Vickey Huger, MD;  Location: [facility];  Service: Orthopedics;  Laterality: Left;   TUMOR EXCISION  2003   right muscle back, benign   Patient Active Problem List   Diagnosis Date Noted   Seasonal allergic rhinitis due to pollen 05/17/2021   Mild persistent asthma without complication [id number]   DDD (degenerative disc disease), cervical 09/15/2017   Depression 02/02/2016   Arthropathy of right shoulder 02/02/2016   Insomnia 04/13/2011   Osteoporosis 04/13/2011   Biliary dyskinesia 04/13/2011   Osteoarthritis 04/13/2011   History of migraine headaches 04/13/2011   Vitamin D deficiency 04/13/2011   Anxiety 04/13/2011   Irritable bowel syndrome 04/13/2011    PCP: Elby Showers, MD   REFERRING PROVIDER: Armond Hang, MD   REFERRING DIAG:  Achilles tendinitis, right leg [M76.61]   THERAPY DIAG:  Pain in right ankle and joints of right foot  Muscle weakness (generalized)  Chronic pain of left  knee  Rationale for Evaluation and Treatment: Rehabilitation  ONSET DATE: Started 02/11/22 and progressively worsened  SUBJECTIVE:   SUBJECTIVE STATEMENT:  " The pain is slowly disappearing, no pain today. Just soreness from working the muscle."  PERTINENT HISTORY:  L  TKA 10-18-21 ,Osteoporosis 09-13-22  2/10  at end  1/10 09-27-22  1/10 10-04-22 at initiation 2/10 10-11-22  0/10  PAIN:  Are you having pain? Yes: NPRS scale: 0/10 Pain location: back of the ankle Pain description: burning, aching, sore, sharp / shoot.  Aggravating factors: any standing/ walking, descending steps Relieving factors: sitting/ resting  PRECAUTIONS: None  WEIGHT BEARING RESTRICTIONS: No  FALLS:  Has patient fallen in last 6 months? No  LIVING ENVIRONMENT: Lives with: lives with their spouse Lives in: House/apartment Stairs: Yes: Internal: 12 steps; on left going up and External: 2 steps; none Has following equipment at home:  [place]: Retired  PLOF: Independent  PATIENT GOALS: stop having so much pain  NEXT MD VISIT:   OBJECTIVE:   DIAGNOSTIC FINDINGS: MRI at MD's office which pt provided report for Impression of r ankle MRI 07/11/2022 Severe distal achilles tendinosis with high-grade partial tearing of the tendon at its calcaneal insertion. No significant associated tendon retraction Mild to moderate retrocalcanel bursitis  with associated with haglund defomrityof the posterior calcaneus.  Nondisplaced split tearing of the peroneus longus with retromalleolar groove and peroneal brevis near the level of the peroneal tubercle Subacute/chronic partial tearing of the ATFL. Additional suspected chronic partial tearing/degenerationg of the superomedial of the calcanavicular spring ligament.  PATIENT SURVEYS:  FOTO 47% and predicted 61% 09/22/2024  65%  COGNITION: Overall cognitive status: Within functional limits for tasks assessed     SENSATION: WFL  EDEMA:  Figure 8: R 49 cm,  47.cm   1-23- 24  RT 45cm, LT 45.25 cm  10-04-22 LT 44.25  POSTURE: rounded shoulders, forward head, and anterior pelvic tilt WNL   PALPATION: TTP along the posterior cancanela tubercle with associated swelling noted as a result of haglunds deformity. Multiple trigger points  in the R calf with specific soreness along the medial soleus.  LOWER EXTREMITY ROM:  Active/ passive ROM Right eval Left eval RT 09-27-22 RT 10-06-22 RT 10-11-22  Hip flexion       Hip extension       Hip abduction       Hip adduction       Hip internal rotation       Hip external rotation       Knee flexion       Knee extension       Ankle dorsiflexion A2/10P A9 A5/10P A8/P11 A8/P11  Ankle plantarflexion 68 51     Ankle inversion [facility]     Ankle eversion WFL WFL      (Blank rows = not tested)  LOWER EXTREMITY MMT:  MMT Right eval Left eval RT 09-16-22 LT 10-06-22 RT/LT 10-11-22  Hip flexion       Hip extension       Hip abduction       Hip adduction       Hip internal rotation       Hip external rotation       Knee flexion       Knee extension       Ankle dorsiflexion 5+ 5+     Ankle plantarflexion 3+ P! 5+4 8/25 loses range 20/25 9/20 out of 25  Ankle inversion 4+ 4+   4+  Ankle eversion 4+ 4+   4+  Posterior tibalis R 3=/5 P!    L 5 Peroneal brevis/ longus (4- with P!)  L 5   (Blank rows = not tested)  LOWER EXTREMITY SPECIAL TESTS:  N/A  FUNCTIONAL TESTS:  6 min walk test - 1669 ft outdoors   GAIT: Distance walked: 120 ft to tx room Assistive device utilized: None Level of assistance: Complete Independence Comments: decreased stride on RLE with antalgic pattern noted, with decreased toe off compared bil   TODAY'S TREATMENT:   [facility] Adult PT Treatment:                                                DATE: 10/13/2022 Therapeutic Exercise: Treadmill walking at 2.2 speed, x 6 min increasing incline x 2 every 2 min, using tactile cues (when dragging foot on tread as an indicator to  pick up foot/ improve heel strike) Slant board calf stretch 2 x 30 sec with knee straight and bent RLE SLS  rocker board DF/PF 2 x going to fatigue, progressing to inversion/ eversion 1 x going to fatigue Wall sit with  heel raise 2 x going to fatigue (max of 15 reps) Single leg PF on omega leg press drop sets to fatigue starting at 35#, 25#, 20#, 15#, 10#, 5#  Updated HEP for wall sit with heel raise.  Manual Therapy: IASTM along achilles tendon, gastroc/ soleus Active release techniques to the calf in prone Neuromuscular re-ed: Gait training with exaggerated heel strike/ toe off to promote knee extension/ flexion and maximize gait effiency 4 x 40 ft   [facility] Adult PT Treatment:                                                DATE: 10-11-22 Therapeutic Exercise: 3 way sitting soleus exercise 30 x  Leg press BIL 3 plates Leg press RT LE 2 plates 2 x 15 Leg press SL R 1 plate with 12 x heel raise Kickstand dead lift on RT with 20 # DB Purple Powerband DF/knee flexion on step 3 way IR/ER/Forward 30 x with PT assist with AP pull Manual Therapy: IASTM gastroc / solues and posterior tib. Trigger Point Dry-Needling  Treatment instructions: Expect mild to moderate muscle soreness. S/S of pneumothorax if dry needled over a lung field, and to seek immediate medical attention should they occur. Patient verbalized understanding of these instructions and education.  Patient Consent Given: No Education handout provided: Previously provided Muscles treated: peroneal longus, Lateral gastroc and posterior tib RT Electrical stimulation performed: No Parameters: N/A Treatment response/outcome: twitch response noted muscle lengthening.   [facility] Adult PT Treatment:                                                DATE: 10-06-22 Therapeutic Exercise: 3 way sitting soleus exercise 30 x  Dead lift  25 # x 15,  30# KB 1 x 10,  45# KB 1 x 5 Black TB DF/knee flexion on step 3 way IR/ER/Forward 30 x with PT assist  with AP pull Heel raises off end of step 1 x to fatigue, 1 con bil/ eccentric RLE to fatigue then progressing to bil LE calves  Step ups on 8 inch 12 x 2 on RT Sustained squat position doing lateral band walks. Using low table for reference with GTB. Manual Therapy: Ankle mobs talar and subtalar to improve DF grade 3    PATIENT EDUCATION:  Education details: Evaluation findings, POC, Goals, HEP with proper form/ rationale.  Person educated: Patient Education method: Explanation, Verbal cues, and Handouts Education comprehension: verbalized understanding  HOME EXERCISE PROGRAM: Access Code: [email or web address] URL: [email or web address] Date: 10/13/2022 Prepared by: Starr Lake  Program Notes 1) 3 way soleus heel raise 25 # weight in sitting.  Super important2) Do the black Theraband around ankle and do knee flexion and Dorsiflexion of ankle on step  3 way skier 30 x   Exercises - Seated Calf Stretch with Strap  - 3 x daily - 7 x weekly - 2 sets - 2 reps - 30 sec hold - Gastroc Stretch on Wall  - 3 x daily - 7 x weekly - 2 sets - 2 reps - 30 sec hold - Ankle Plantar flexion  - 1 x daily - 2 sets - 10 reps - Woodpeckers One Leg Every OTHER day  -  1 x daily - 7 x weekly - 2-3 sets - 10 reps - Seated Calf Raise with Weights on Thighs On Step  - 1 x daily - 7 x weekly - 3 sets - 10 reps - Seated Ankle Eversion with Resistance  - 1 x daily - 7 x weekly - 3 sets - 10 reps - Seated Figure 4 Ankle Inversion with Resistance  - 1 x daily - 7 x weekly - 3 sets - 10 reps - Wall Squat with Heel Raises  - 1 x daily - 7 x weekly - 2 sets - 15 reps  ASSESSMENT:  CLINICAL IMPRESSION: Mrs Aston continues to report no pain coming into session but does note pain/ limitation with walking. Continued working on STW along the gastroc/ soleus with active release techniques. Worked on gait training on treadmill with incline and SLS strengthenig of the RLE. Utilized drop sets with leg press calf  press to maximize strengthening which she did well with. End of session she reported feeling good.   OBJECTIVE IMPAIRMENTS: Abnormal gait, decreased activity tolerance, decreased balance, decreased endurance, decreased strength, increased edema, increased fascial restrictions, increased muscle spasms, improper body mechanics, postural dysfunction, and pain.   ACTIVITY LIMITATIONS: carrying, lifting, standing, squatting, and stairs  PARTICIPATION LIMITATIONS: driving, shopping, and community activity  PERSONAL FACTORS: Age, Past/current experiences, and 1 comorbidity: osteoporosis  are also affecting patient's functional outcome.   REHAB POTENTIAL: Good  CLINICAL DECISION MAKING: Evolving/moderate complexity  EVALUATION COMPLEXITY: Moderate   GOALS: Goals reviewed with patient? Yes  SHORT TERM GOALS: Target date: 09/19/2022   Pt to be IND with initial HEP for therapeutic progression Baseline: Goal status: MET  2.  Pt to report max pain to </= 5/10 to demo improving condition Baseline: 09-06-22  at worst 4/10 at night Goal status: MET  3.  Pt to demonstrate efficient gait pattern with report of </= 5/10 pain to reduce potential proximal kinetic chain problems Baseline: 09-06-22  has slight antalgic gait but now negotiating steps up and down with alternating pattern 09-13-22   1/10 at end of RX  initial 2/10 Goal status: MET  4.  Pt to increase baseline FOTO score to >/= 56%  Baseline:  Goal status: MET 09/22/2022  5.  Reduce ankle swelling by >/= 1 cm to promote ankle mobility Baseline: RT 49, LT 47  09-06-22 RT 45cm, LT 45.25 cm Goal status:MET  LONG TERM GOALS: Target date: 12-06-22  Increase R ankle DF to >/= 8 degrees with no report of pain during assessment to assist with toe clearance with gait.  Baseline: 10-06-22 RT DF 6 PROM 11 10-11-22 8 AROM Goal status:MET  2.  Increase R ankle gross strength to >/= 4/5 with </=2.10 max pain during testing to demo improvement in ankle  strength/ stability.  Baseline:  Goal status: ONGOING  3.  Increase FOTO score to >/= 61% to demo improvement in function Baseline: 09-22-22 65% Goal status MET  4.  Pt to report improvement in descending steps and heavy duty house chores/ ADLS with max pain of </= 2/10 pain Baseline: 10-06-22  improving steps but not greater than 8 inches in prep for hiking  10-11-22  Goal status: MET  5.  Pt be able to peform 6 min walk test achieve appropriate age specific distance of ~1545 ft with </= 2/10 max pain Baseline: 1669 ft outdoors  2/10 Goal status: MET       6. Pt will be able to walk 2 miles without  exacerbating pain so she can be able to hike with family    Baseline 1.25 mile    Goal status: INITIAL (10/11/22)    7. Pt will be able to perform standing SL heel raise on RT 20/25 x    Baseline 8/25 with poor form    Goal Status; INTIAL (10/11/22)   PLAN:  PT FREQUENCY: 1-2x/week  PT DURATION: 8 weeks    PLANNED INTERVENTIONS: Therapeutic exercises, Therapeutic activity, Neuromuscular re-education, Balance training, Gait training, Patient/Family education, Self Care, Joint mobilization, Joint manipulation, Stair training, Aquatic Therapy, Dry Needling, Cryotherapy, Moist heat, Taping, Vasopneumatic device, Ultrasound, Ionotophoresis '4mg'$ /ml Dexamethasone, Manual therapy, and Re-evaluation  PLAN FOR NEXT SESSION: review/ update HEP PRN. Response to TPDN for the R calf continue PRN, ankle strengthening both intrinsic/ extrinsic, gait training.   AROM measures. Pt will continue 1 x a week for additional 6 weeks to increase SL stance strength and increase to 2 mile walking for preparation of hiking    Caro Brundidge PT, DPT, LAT, ATC  10/13/22  10:28 AM

## 2022-10-19 NOTE — Therapy (Signed)
OUTPATIENT PHYSICAL THERAPY LOWER EXTREMITY TREATMENT NOTE/ ERO   Patient Name: Mary Garcia MRN: [id number] DOB:28-Jul-1946, 77 y.o., female Today's Date: 10/20/2022  END OF SESSION:  PT End of Session - 10/20/22 0851     Visit Number 12    Number of Visits 18    Date for PT Re-Evaluation 12/06/22    Authorization Type [employer]    Progress Note Due on Visit 20    PT Start Time [phone]    PT Stop Time 0930    PT Time Calculation (min) 44 min    Activity Tolerance Patient tolerated treatment well    Behavior During Therapy WFL for tasks assessed/performed                     Past Medical History:  Diagnosis Date   Anxiety    Arthritis    Asthma    Bronchitis, acute    Past Surgical History:  Procedure Laterality Date   BREAST ENHANCEMENT SURGERY     CATARACT EXTRACTION W/ INTRAOCULAR LENS IMPLANT Bilateral    FACELIFT     local   ROTATOR CUFF REPAIR Right    TIBIA FRACTURE SURGERY  2004   left leg x3, tibia and fibula   TOTAL KNEE ARTHROPLASTY Left 10/18/2021   Procedure: TOTAL KNEE ARTHROPLASTY;  Surgeon: Vickey Huger, MD;  Location: [facility];  Service: Orthopedics;  Laterality: Left;   TUMOR EXCISION  2003   right muscle back, benign   Patient Active Problem List   Diagnosis Date Noted   Seasonal allergic rhinitis due to pollen 05/17/2021   Mild persistent asthma without complication [id number]   DDD (degenerative disc disease), cervical 09/15/2017   Depression 02/02/2016   Arthropathy of right shoulder 02/02/2016   Insomnia 04/13/2011   Osteoporosis 04/13/2011   Biliary dyskinesia 04/13/2011   Osteoarthritis 04/13/2011   History of migraine headaches 04/13/2011   Vitamin D deficiency 04/13/2011   Anxiety 04/13/2011   Irritable bowel syndrome 04/13/2011    PCP: Elby Showers, MD   REFERRING PROVIDER: Armond Hang, MD   REFERRING DIAG:  Achilles tendinitis, right leg [M76.61]   THERAPY DIAG:  Pain in right ankle and joints of right  foot  Muscle weakness (generalized)  Difficulty in walking, not elsewhere classified  Localized edema  Chronic pain of left knee  Rationale for Evaluation and Treatment: Rehabilitation  ONSET DATE: Started 02/11/22 and progressively worsened  SUBJECTIVE:   SUBJECTIVE STATEMENT:  I am feeling better, No pain today.I am walking 1.5 miles  PERTINENT HISTORY:  L  TKA 10-18-21 ,Osteoporosis 09-13-22  2/10  at end  1/10 09-27-22  1/10 10-04-22 at initiation 2/10 10-11-22  0/10  PAIN:  Are you having pain? Yes: NPRS scale: 0/10 Pain location: back of the ankle Pain description: burning, aching, sore, sharp / shoot.  Aggravating factors: any standing/ walking, descending steps Relieving factors: sitting/ resting  PRECAUTIONS: None  WEIGHT BEARING RESTRICTIONS: No  FALLS:  Has patient fallen in last 6 months? No  LIVING ENVIRONMENT: Lives with: lives with their spouse Lives in: House/apartment Stairs: Yes: Internal: 12 steps; on left going up and External: 2 steps; none Has following equipment at home:  [place]: Retired  PLOF: Independent  PATIENT GOALS: stop having so much pain  NEXT MD VISIT:   OBJECTIVE:   DIAGNOSTIC FINDINGS: MRI at MD's office which pt provided report for Impression of r ankle MRI 07/11/2022 Severe distal achilles tendinosis with high-grade partial tearing of  the tendon at its calcaneal insertion. No significant associated tendon retraction Mild to moderate retrocalcanel bursitis with associated with haglund defomrityof the posterior calcaneus.  Nondisplaced split tearing of the peroneus longus with retromalleolar groove and peroneal brevis near the level of the peroneal tubercle Subacute/chronic partial tearing of the ATFL. Additional suspected chronic partial tearing/degenerationg of the superomedial of the calcanavicular spring ligament.  PATIENT SURVEYS:  FOTO 47% and predicted 61% 09/22/2024  65%  COGNITION: Overall cognitive  status: Within functional limits for tasks assessed     SENSATION: WFL  EDEMA:  Figure 8: R 49 cm, 47.cm   1-23- 24  RT 45cm, LT 45.25 cm  10-04-22 LT 44.25  POSTURE: rounded shoulders, forward head, and anterior pelvic tilt WNL   PALPATION: TTP along the posterior cancanela tubercle with associated swelling noted as a result of haglunds deformity. Multiple trigger points  in the R calf with specific soreness along the medial soleus.  LOWER EXTREMITY ROM:  Active/ passive ROM Right eval Left eval RT 09-27-22 RT 10-06-22 RT 10-11-22  Hip flexion       Hip extension       Hip abduction       Hip adduction       Hip internal rotation       Hip external rotation       Knee flexion       Knee extension       Ankle dorsiflexion A2/10P A9 A5/10P A8/P11 A8/P11  Ankle plantarflexion 68 51     Ankle inversion [facility]     Ankle eversion WFL WFL      (Blank rows = not tested)  LOWER EXTREMITY MMT:  MMT Right eval Left eval RT 09-16-22 LT 10-06-22 RT/LT 10-11-22  Hip flexion       Hip extension       Hip abduction       Hip adduction       Hip internal rotation       Hip external rotation       Knee flexion       Knee extension       Ankle dorsiflexion 5+ 5+     Ankle plantarflexion 3+ P! 5+4 8/25 loses range 20/25 9/20 out of 25  Ankle inversion 4+ 4+   4+  Ankle eversion 4+ 4+   4+  Posterior tibalis R 3=/5 P!    L 5 Peroneal brevis/ longus (4- with P!)  L 5   (Blank rows = not tested)  LOWER EXTREMITY SPECIAL TESTS:  N/A  FUNCTIONAL TESTS:  6 min walk test - 1669 ft outdoors   GAIT: Distance walked: 120 ft to tx room Assistive device utilized: None Level of assistance: Complete Independence Comments: decreased stride on RLE with antalgic pattern noted, with decreased toe off compared bil   TODAY'S TREATMENT:   [facility] Adult PT Treatment:                                                DATE: 10-20-22 Therapeutic Exercise:  Elliptical Warm-up and subjective  taken Slant board calf stretch 2 x 30 sec with knee straight and bent Treadmill walking at 2.3 speed, x 6 min increasing incline x 2 every 2 min, using tactile cues (when dragging foot on tread as an indicator to pick up foot/ improve heel strike) Single  leg PF on omega leg press drop sets to fatigue starting at 45# (20X), 35#(31), 25#(49), 20#(70) Wall sit with heel raise 2 x going to fatigue (max of 16 reps) RLE SLS  rocker board DF/PF 2 x going to fatigue, progressing to inversion/ eversion 1 x going to fatigue  Manual Therapy: IASTM along achilles tendon, gastroc/ soleus  [facility] Adult PT Treatment:                                                DATE: 10/13/2022 Therapeutic Exercise: Treadmill walking at 2.2 speed, x 6 min increasing incline x 2 every 2 min, using tactile cues  VC for pick up foot/ improve heel strike Slant board calf stretch 2 x 30 sec with knee straight and bent RLE SLS  rocker board DF/PF 2 x going to fatigue, progressing to inversion/ eversion 1 x going to fatigue Wall sit with heel raise 2 x going to fatigue (max of 15 reps) Single leg PF on omega leg press drop sets to fatigue starting at 35#, 25#, 20#, 15#, 10#, 5#  Updated HEP for wall sit with heel raise.  Manual Therapy: IASTM along achilles tendon, gastroc/ soleus Active release techniques to the calf in prone Neuromuscular re-ed: Gait training with exaggerated heel strike/ toe off to promote knee extension/ flexion and maximize gait effiency 4 x 40 ft   [facility] Adult PT Treatment:                                                DATE: 10-11-22 Therapeutic Exercise: 3 way sitting soleus exercise 30 x  Leg press BIL 3 plates Leg press RT LE 2 plates 2 x 15 Leg press SL R 1 plate with 12 x heel raise Kickstand dead lift on RT with 20 # DB Purple Powerband DF/knee flexion on step 3 way IR/ER/Forward 30 x with PT assist with AP pull Manual Therapy: IASTM gastroc / solues and posterior tib. Trigger Point  Dry-Needling  Treatment instructions: Expect mild to moderate muscle soreness. S/S of pneumothorax if dry needled over a lung field, and to seek immediate medical attention should they occur. Patient verbalized understanding of these instructions and education.  Patient Consent Given: No Education handout provided: Previously provided Muscles treated: peroneal longus, Lateral gastroc and posterior tib RT Electrical stimulation performed: No Parameters: N/A Treatment response/outcome: twitch response noted muscle lengthening.   [facility] Adult PT Treatment:                                                DATE: 10-06-22 Therapeutic Exercise: 3 way sitting soleus exercise 30 x  Dead lift  25 # x 15,  30# KB 1 x 10,  45# KB 1 x 5 Black TB DF/knee flexion on step 3 way IR/ER/Forward 30 x with PT assist with AP pull Heel raises off end of step 1 x to fatigue, 1 con bil/ eccentric RLE to fatigue then progressing to bil LE calves  Step ups on 8 inch 12 x 2 on RT Sustained squat position doing lateral band walks. Using low  table for reference with GTB. Manual Therapy: Ankle mobs talar and subtalar to improve DF grade 3    PATIENT EDUCATION:  Education details: Evaluation findings, POC, Goals, HEP with proper form/ rationale.  Person educated: Patient Education method: Explanation, Verbal cues, and Handouts Education comprehension: verbalized understanding  HOME EXERCISE PROGRAM: Access Code: [email or web address] URL: [email or web address] Date: 10/13/2022 Prepared by: Starr Lake  Program Notes 1) 3 way soleus heel raise 25 # weight in sitting.  Super important2) Do the black Theraband around ankle and do knee flexion and Dorsiflexion of ankle on step  3 way skier 30 x   Exercises - Seated Calf Stretch with Strap  - 3 x daily - 7 x weekly - 2 sets - 2 reps - 30 sec hold - Gastroc Stretch on Wall  - 3 x daily - 7 x weekly - 2 sets - 2 reps - 30 sec hold - Ankle Plantar flexion  - 1 x  daily - 2 sets - 10 reps - Woodpeckers One Leg Every OTHER day  - 1 x daily - 7 x weekly - 2-3 sets - 10 reps - Seated Calf Raise with Weights on Thighs On Step  - 1 x daily - 7 x weekly - 3 sets - 10 reps - Seated Ankle Eversion with Resistance  - 1 x daily - 7 x weekly - 3 sets - 10 reps - Seated Figure 4 Ankle Inversion with Resistance  - 1 x daily - 7 x weekly - 3 sets - 10 reps - Wall Squat with Heel Raises  - 1 x daily - 7 x weekly - 2 sets - 15 reps  ASSESSMENT:  CLINICAL IMPRESSION: Mrs Praytor continues to report no pain coming into session and pt reports walking 1.5 miles now.  Vonetta continues to need VC for gait on treadmill and incline to pick up feet and improve heel strike. Pt completes 12 heel raise to fatigue at end of session about 1/2 range  Manual working on STW/IASTYM along the gastroc/ soleus at end of session. Worked on gait training on treadmill with incline and SLS strengthenig of the RLE. Pt with more pain on active stretch on medial gastroc LT.   OBJECTIVE IMPAIRMENTS: Abnormal gait, decreased activity tolerance, decreased balance, decreased endurance, decreased strength, increased edema, increased fascial restrictions, increased muscle spasms, improper body mechanics, postural dysfunction, and pain.   ACTIVITY LIMITATIONS: carrying, lifting, standing, squatting, and stairs  PARTICIPATION LIMITATIONS: driving, shopping, and community activity  PERSONAL FACTORS: Age, Past/current experiences, and 1 comorbidity: osteoporosis  are also affecting patient's functional outcome.   REHAB POTENTIAL: Good  CLINICAL DECISION MAKING: Evolving/moderate complexity  EVALUATION COMPLEXITY: Moderate   GOALS: Goals reviewed with patient? Yes  SHORT TERM GOALS: Target date: 09/19/2022   Pt to be IND with initial HEP for therapeutic progression Baseline: Goal status: MET  2.  Pt to report max pain to </= 5/10 to demo improving condition Baseline: 09-06-22  at worst 4/10 at  night Goal status: MET  3.  Pt to demonstrate efficient gait pattern with report of </= 5/10 pain to reduce potential proximal kinetic chain problems Baseline: 09-06-22  has slight antalgic gait but now negotiating steps up and down with alternating pattern 09-13-22   1/10 at end of RX  initial 2/10 Goal status: MET  4.  Pt to increase baseline FOTO score to >/= 56%  Baseline:  Goal status: MET 09/22/2022  5.  Reduce ankle swelling by >/= 1 cm to promote ankle  mobility Baseline: RT 49, LT 47  09-06-22 RT 45cm, LT 45.25 cm Goal status:MET  LONG TERM GOALS: Target date: 12-06-22  Increase R ankle DF to >/= 8 degrees with no report of pain during assessment to assist with toe clearance with gait.  Baseline: 10-06-22 RT DF 6 PROM 11 10-11-22 8 AROM Goal status:MET  2.  Increase R ankle gross strength to >/= 4/5 with </=2.10 max pain during testing to demo improvement in ankle strength/ stability.  Baseline:  Goal status: ONGOING  3.  Increase FOTO score to >/= 61% to demo improvement in function Baseline: 09-22-22 65% Goal status MET  4.  Pt to report improvement in descending steps and heavy duty house chores/ ADLS with max pain of </= 2/10 pain Baseline: 10-06-22  improving steps but not greater than 8 inches in prep for hiking  10-11-22  Goal status: MET  5.  Pt be able to peform 6 min walk test achieve appropriate age specific distance of ~1545 ft with </= 2/10 max pain Baseline: 1669 ft outdoors  2/10 Goal status: MET       6. Pt will be able to walk 2 miles without exacerbating pain so she can be able to hike with family    Baseline 1.25 mile    Goal status: INITIAL (10/11/22)    7. Pt will be able to perform standing SL heel raise on RT 20/25 x    Baseline 8/25 with poor form    Goal Status; INTIAL (10/11/22)   PLAN:  PT FREQUENCY: 1-2x/week  PT DURATION: 8 weeks    PLANNED INTERVENTIONS: Therapeutic exercises, Therapeutic activity, Neuromuscular re-education, Balance  training, Gait training, Patient/Family education, Self Care, Joint mobilization, Joint manipulation, Stair training, Aquatic Therapy, Dry Needling, Cryotherapy, Moist heat, Taping, Vasopneumatic device, Ultrasound, Ionotophoresis '4mg'$ /ml Dexamethasone, Manual therapy, and Re-evaluation  PLAN FOR NEXT SESSION: review/ update HEP PRN. Response to TPDN for the R calf continue PRN, ankle strengthening both intrinsic/ extrinsic, gait training.   AROM measures. Pt will continue 1 x a week for additional 6 weeks to increase SL stance strength and increase to 2 mile walking for preparation of hiking    Voncille Lo, PT, [facility] Certified Exercise Expert for the Aging Adult  10/20/22 9:38 AM Phone: [phone] Fax: [phone]

## 2022-10-20 ENCOUNTER — Ambulatory Visit: Payer: Medicare Other | Attending: Internal Medicine | Admitting: Physical Therapy

## 2022-10-20 ENCOUNTER — Encounter: Payer: Self-pay | Admitting: Physical Therapy

## 2022-10-20 DIAGNOSIS — M6281 Muscle weakness (generalized): Secondary | ICD-10-CM | POA: Diagnosis not present

## 2022-10-20 DIAGNOSIS — R262 Difficulty in walking, not elsewhere classified: Secondary | ICD-10-CM

## 2022-10-20 DIAGNOSIS — G8929 Other chronic pain: Secondary | ICD-10-CM

## 2022-10-20 DIAGNOSIS — M25562 Pain in left knee: Secondary | ICD-10-CM | POA: Insufficient documentation

## 2022-10-20 DIAGNOSIS — M25571 Pain in right ankle and joints of right foot: Secondary | ICD-10-CM

## 2022-10-20 DIAGNOSIS — R600 Localized edema: Secondary | ICD-10-CM

## 2022-10-25 ENCOUNTER — Encounter: Payer: Medicare Other | Admitting: Physical Therapy

## 2022-11-01 ENCOUNTER — Ambulatory Visit: Payer: Medicare Other | Admitting: Physical Therapy

## 2022-11-01 ENCOUNTER — Encounter: Payer: Self-pay | Admitting: Physical Therapy

## 2022-11-01 DIAGNOSIS — M25562 Pain in left knee: Secondary | ICD-10-CM | POA: Diagnosis not present

## 2022-11-01 DIAGNOSIS — M25571 Pain in right ankle and joints of right foot: Secondary | ICD-10-CM

## 2022-11-01 DIAGNOSIS — R600 Localized edema: Secondary | ICD-10-CM | POA: Diagnosis not present

## 2022-11-01 DIAGNOSIS — M6281 Muscle weakness (generalized): Secondary | ICD-10-CM | POA: Diagnosis not present

## 2022-11-01 DIAGNOSIS — R262 Difficulty in walking, not elsewhere classified: Secondary | ICD-10-CM | POA: Diagnosis not present

## 2022-11-01 DIAGNOSIS — G8929 Other chronic pain: Secondary | ICD-10-CM | POA: Diagnosis not present

## 2022-11-01 NOTE — Therapy (Signed)
OUTPATIENT PHYSICAL THERAPY LOWER EXTREMITY TREATMENT NOTE/ ERO   Patient Name: Mary Garcia MRN: [id number] DOB:11-25-1945, 77 y.o., female Today's Date: 11/01/2022  END OF SESSION:  PT End of Session - 11/01/22 0930     Visit Number 13    Number of Visits 18    Date for PT Re-Evaluation 12/06/22    Authorization Type BCBS    Progress Note Due on Visit 20    PT Start Time 0931    PT Stop Time 1013    PT Time Calculation (min) 42 min    Activity Tolerance Patient tolerated treatment well    Behavior During Therapy WFL for tasks assessed/performed                      Past Medical History:  Diagnosis Date   Anxiety    Arthritis    Asthma    Bronchitis, acute    Past Surgical History:  Procedure Laterality Date   BREAST ENHANCEMENT SURGERY     CATARACT EXTRACTION W/ INTRAOCULAR LENS IMPLANT Bilateral    FACELIFT     local   ROTATOR CUFF REPAIR Right    TIBIA FRACTURE SURGERY  2004   left leg x3, tibia and fibula   TOTAL KNEE ARTHROPLASTY Left 10/18/2021   Procedure: TOTAL KNEE ARTHROPLASTY;  Surgeon: Vickey Huger, MD;  Location: [facility];  Service: Orthopedics;  Laterality: Left;   TUMOR EXCISION  2003   right muscle back, benign   Patient Active Problem List   Diagnosis Date Noted   Seasonal allergic rhinitis due to pollen 05/17/2021   Mild persistent asthma without complication [id number]   DDD (degenerative disc disease), cervical 09/15/2017   Depression 02/02/2016   Arthropathy of right shoulder 02/02/2016   Insomnia 04/13/2011   Osteoporosis 04/13/2011   Biliary dyskinesia 04/13/2011   Osteoarthritis 04/13/2011   History of migraine headaches 04/13/2011   Vitamin D deficiency 04/13/2011   Anxiety 04/13/2011   Irritable bowel syndrome 04/13/2011    PCP: Elby Showers, MD   REFERRING PROVIDER: Armond Hang, MD   REFERRING DIAG:  Achilles tendinitis, right leg [M76.61]   THERAPY DIAG:  Pain in right ankle and joints of right  foot  Muscle weakness (generalized)  Rationale for Evaluation and Treatment: Rehabilitation  ONSET DATE: Started 02/11/22 and progressively worsened  SUBJECTIVE:   SUBJECTIVE STATEMENT: " I am very pleased, and I am feeling pretty good today."  PERTINENT HISTORY:  L  TKA 10-18-21 ,Osteoporosis 09-13-22  2/10  at end  1/10 09-27-22  1/10 10-04-22 at initiation 2/10 10-11-22  0/10  PAIN:  Are you having pain? Yes: NPRS scale: 0/10 Pain location: back of the ankle Pain description: burning, aching, sore, sharp / shoot.  Aggravating factors: any standing/ walking, descending steps Relieving factors: sitting/ resting  PRECAUTIONS: None  WEIGHT BEARING RESTRICTIONS: No  FALLS:  Has patient fallen in last 6 months? No  LIVING ENVIRONMENT: Lives with: lives with their spouse Lives in: House/apartment Stairs: Yes: Internal: 12 steps; on left going up and External: 2 steps; none Has following equipment at home:  [place]: Retired  PLOF: Independent  PATIENT GOALS: stop having so much pain  NEXT MD VISIT:   OBJECTIVE:   DIAGNOSTIC FINDINGS: MRI at MD's office which pt provided report for Impression of r ankle MRI 07/11/2022 Severe distal achilles tendinosis with high-grade partial tearing of the tendon at its calcaneal insertion. No significant associated tendon retraction Mild to moderate retrocalcanel  bursitis with associated with haglund defomrityof the posterior calcaneus.  Nondisplaced split tearing of the peroneus longus with retromalleolar groove and peroneal brevis near the level of the peroneal tubercle Subacute/chronic partial tearing of the ATFL. Additional suspected chronic partial tearing/degenerationg of the superomedial of the calcanavicular spring ligament.  PATIENT SURVEYS:  FOTO 47% and predicted 61% 09/22/2024  65%  COGNITION: Overall cognitive status: Within functional limits for tasks assessed     SENSATION: WFL  EDEMA:  Figure 8: R 49 cm,  47.cm   1-23- 24  RT 45cm, LT 45.25 cm  10-04-22 LT 44.25  POSTURE: rounded shoulders, forward head, and anterior pelvic tilt WNL   PALPATION: TTP along the posterior cancanela tubercle with associated swelling noted as a result of haglunds deformity. Multiple trigger points  in the R calf with specific soreness along the medial soleus.  LOWER EXTREMITY ROM:  Active/ passive ROM Right eval Left eval RT 09-27-22 RT 10-06-22 RT 10-11-22  Hip flexion       Hip extension       Hip abduction       Hip adduction       Hip internal rotation       Hip external rotation       Knee flexion       Knee extension       Ankle dorsiflexion A2/10P A9 A5/10P A8/P11 A8/P11  Ankle plantarflexion 68 51     Ankle inversion [facility]     Ankle eversion WFL WFL      (Blank rows = not tested)  LOWER EXTREMITY MMT:  MMT Right eval Left eval RT 09-16-22 LT 10-06-22 RT/LT 10-11-22  Hip flexion       Hip extension       Hip abduction       Hip adduction       Hip internal rotation       Hip external rotation       Knee flexion       Knee extension       Ankle dorsiflexion 5+ 5+     Ankle plantarflexion 3+ P! 5+4 8/25 loses range 20/25 9/20 out of 25  Ankle inversion 4+ 4+   4+  Ankle eversion 4+ 4+   4+  Posterior tibalis R 3=/5 P!    L 5 Peroneal brevis/ longus (4- with P!)  L 5   (Blank rows = not tested)  LOWER EXTREMITY SPECIAL TESTS:  N/A  FUNCTIONAL TESTS:  6 min walk test - 1669 ft outdoors   GAIT: Distance walked: 120 ft to tx room Assistive device utilized: None Level of assistance: Complete Independence Comments: decreased stride on RLE with antalgic pattern noted, with decreased toe off compared bil   TODAY'S TREATMENT:   [facility] Adult PT Treatment:                                                DATE: 11/01/22 Therapeutic Exercise: Treadmill on incline at 15%, gradually increasing speed from 1.5 to max of  x 6 min. Standing slant board stretch 2 x 30 sec gastroc/ soleus SLS  heel raise to fatigue, moving to con bil/ ecc RL, the bil heel raise off 6 inch step going to fatigue for all phases x 2 sets Sled pushing 6 x with 50# verbal cues for proper form Toe walking  4 x 20 ft Manual Therapy: R talocrural distraction grade V with cavitation noted Neuromuscular re-ed: Squats on reverse bosu in // 2 x 15     [facility] Adult PT Treatment:                                                DATE: 10-20-22 Therapeutic Exercise:  Elliptical Warm-up and subjective taken Slant board calf stretch 2 x 30 sec with knee straight and bent Treadmill walking at 2.3 speed, x 6 min increasing incline x 2 every 2 min, using tactile cues (when dragging foot on tread as an indicator to pick up foot/ improve heel strike) Single leg PF on omega leg press drop sets to fatigue starting at 45# (20X), 35#(31), 25#(49), 20#(70) Wall sit with heel raise 2 x going to fatigue (max of 16 reps) RLE SLS  rocker board DF/PF 2 x going to fatigue, progressing to inversion/ eversion 1 x going to fatigue  Manual Therapy: IASTM along achilles tendon, gastroc/ soleus  [facility] Adult PT Treatment:                                                DATE: 10/13/2022 Therapeutic Exercise: Treadmill walking at 2.2 speed, x 6 min increasing incline x 2 every 2 min, using tactile cues  VC for pick up foot/ improve heel strike Slant board calf stretch 2 x 30 sec with knee straight and bent RLE SLS  rocker board DF/PF 2 x going to fatigue, progressing to inversion/ eversion 1 x going to fatigue Wall sit with heel raise 2 x going to fatigue (max of 15 reps) Single leg PF on omega leg press drop sets to fatigue starting at 35#, 25#, 20#, 15#, 10#, 5#  Updated HEP for wall sit with heel raise.  Manual Therapy: IASTM along achilles tendon, gastroc/ soleus Active release techniques to the calf in prone Neuromuscular re-ed: Gait training with exaggerated heel strike/ toe off to promote knee extension/ flexion and maximize gait  effiency 4 x 40 ft     PATIENT EDUCATION:  Education details: Evaluation findings, POC, Goals, HEP with proper form/ rationale.  Person educated: Patient Education method: Explanation, Verbal cues, and Handouts Education comprehension: verbalized understanding  HOME EXERCISE PROGRAM: Access Code: [email or web address] URL: [email or web address] Date: 10/13/2022 Prepared by: Starr Lake  Program Notes 1) 3 way soleus heel raise 25 # weight in sitting.  Super important2) Do the black Theraband around ankle and do knee flexion and Dorsiflexion of ankle on step  3 way skier 30 x   Exercises - Seated Calf Stretch with Strap  - 3 x daily - 7 x weekly - 2 sets - 2 reps - 30 sec hold - Gastroc Stretch on Wall  - 3 x daily - 7 x weekly - 2 sets - 2 reps - 30 sec hold - Ankle Plantar flexion  - 1 x daily - 2 sets - 10 reps - Woodpeckers One Leg Every OTHER day  - 1 x daily - 7 x weekly - 2-3 sets - 10 reps - Seated Calf Raise with Weights on Thighs On Step  - 1 x daily - 7 x weekly - 3 sets - 10 reps - Seated Ankle  Eversion with Resistance  - 1 x daily - 7 x weekly - 3 sets - 10 reps - Seated Figure 4 Ankle Inversion with Resistance  - 1 x daily - 7 x weekly - 3 sets - 10 reps - Wall Squat with Heel Raises  - 1 x daily - 7 x weekly - 2 sets - 15 reps  ASSESSMENT:  CLINICAL IMPRESSION: Mrs Geer arrives to PT today reporting no pain and is overall doing much better. Continued working on calf and LE strengthening with increased reps to maximize strengthe and endurance. She continues to do very well with exercise and is making progress with RLE strengthening but does continue to fatigue faster compared bil. End of session she denied any pain or issues.  OBJECTIVE IMPAIRMENTS: Abnormal gait, decreased activity tolerance, decreased balance, decreased endurance, decreased strength, increased edema, increased fascial restrictions, increased muscle spasms, improper body mechanics, postural  dysfunction, and pain.   ACTIVITY LIMITATIONS: carrying, lifting, standing, squatting, and stairs  PARTICIPATION LIMITATIONS: driving, shopping, and community activity  PERSONAL FACTORS: Age, Past/current experiences, and 1 comorbidity: osteoporosis  are also affecting patient's functional outcome.   REHAB POTENTIAL: Good  CLINICAL DECISION MAKING: Evolving/moderate complexity  EVALUATION COMPLEXITY: Moderate   GOALS: Goals reviewed with patient? Yes  SHORT TERM GOALS: Target date: 09/19/2022   Pt to be IND with initial HEP for therapeutic progression Baseline: Goal status: MET  2.  Pt to report max pain to </= 5/10 to demo improving condition Baseline: 09-06-22  at worst 4/10 at night Goal status: MET  3.  Pt to demonstrate efficient gait pattern with report of </= 5/10 pain to reduce potential proximal kinetic chain problems Baseline: 09-06-22  has slight antalgic gait but now negotiating steps up and down with alternating pattern 09-13-22   1/10 at end of RX  initial 2/10 Goal status: MET  4.  Pt to increase baseline FOTO score to >/= 56%  Baseline:  Goal status: MET 09/22/2022  5.  Reduce ankle swelling by >/= 1 cm to promote ankle mobility Baseline: RT 49, LT 47  09-06-22 RT 45cm, LT 45.25 cm Goal status:MET  LONG TERM GOALS: Target date: 12-06-22  Increase R ankle DF to >/= 8 degrees with no report of pain during assessment to assist with toe clearance with gait.  Baseline: 10-06-22 RT DF 6 PROM 11 10-11-22 8 AROM Goal status:MET  2.  Increase R ankle gross strength to >/= 4/5 with </=2.10 max pain during testing to demo improvement in ankle strength/ stability.  Baseline:  Goal status: ONGOING  3.  Increase FOTO score to >/= 61% to demo improvement in function Baseline: 09-22-22 65% Goal status MET  4.  Pt to report improvement in descending steps and heavy duty house chores/ ADLS with max pain of </= 2/10 pain Baseline: 10-06-22  improving steps but not greater than  8 inches in prep for hiking  10-11-22  Goal status: MET  5.  Pt be able to peform 6 min walk test achieve appropriate age specific distance of ~1545 ft with </= 2/10 max pain Baseline: 1669 ft outdoors  2/10 Goal status: MET     6. Pt will be able to walk 2 miles without exacerbating pain so she can be able to hike with family    Baseline 1.25 mile    Goal status: INITIAL (10/11/22)    7. Pt will be able to perform standing SL heel raise on RT 20/25 x    Baseline 8/25 with  poor form    Goal Status; INTIAL (10/11/22)   PLAN:  PT FREQUENCY: 1-2x/week  PT DURATION: 8 weeks    PLANNED INTERVENTIONS: Therapeutic exercises, Therapeutic activity, Neuromuscular re-education, Balance training, Gait training, Patient/Family education, Self Care, Joint mobilization, Joint manipulation, Stair training, Aquatic Therapy, Dry Needling, Cryotherapy, Moist heat, Taping, Vasopneumatic device, Ultrasound, Ionotophoresis 4mg /ml Dexamethasone, Manual therapy, and Re-evaluation  PLAN FOR NEXT SESSION: review/ update HEP PRN. Response to TPDN for the R calf continue PRN, ankle strengthening both intrinsic/ extrinsic, gait training.   AROM measures. Pt will continue 1 x a week for additional 6 weeks to increase SL stance strength and increase to 2 mile walking for preparation of hiking   Trevis Eden PT, DPT, LAT, ATC  11/01/22  10:16 AM

## 2022-11-15 NOTE — Progress Notes (Signed)
Annual Wellness Visit    Patient Care Team: Goldie Tregoning, Luanna ColeMary J, MD as PCP - General (Internal Medicine) Ellamae SiaKim, Yoon M, DO as Consulting Physician (Allergy)  Visit Date: 11/22/22   Chief Complaint  Patient presents with   Medicare Wellness   Annual Exam    Subjective:   Patient: Mary Garcia, Female    DOB: 02/04/1946, 77 y.o.   MRN: [id number]  Mary Garcia is a 77 y.o. Female who presents today for her Annual Wellness Visit. She has a history of anxiety, arthritis, asthma, acute bronchitis, osteoporosis.  History of osteoporosis treated with Boniva 150 mg every 30 days. 12/08/21 bone density scan showed T-score at -2.5 AP spine  History of arthritis treated with Mobic 7.5 mg daily as needed.  History of anxiety treated with Xanax 0.25 - 0.5 mg twice daily as needed.  History of insomnia treated with Ambien 10 mg at bedtime as needed.  Seen by allergist, Dr. Selena BattenKim, for mild persistent asthma. Stable with Symbicort, albuterol as needed.  Would like her hearing checked. She will contact her ENT.  Continues with Myrbetriq.  HDL at 104, CHOL elevated at 226, LDL at 102. Thyroid, kidney, liver function normal. Vitamin D normal.  Reports she will have her eye exam tomorrow.  Pelvic deferred.  Mammogram last completed 09/07/22. No mammographic evidence of malignancy. Recommended repeat in 2026. Was told that one of her implants is leaking. Breast center will monitor on a regular basis. She is not inclined to replace it.  Colonoscopy last completed 01/01/14. Results showed normal colon, mild diverticulosis of left colon. Recommended repeat in 2025.  08/16/22 CT coronary calcium showed lungs, airways normal, no significant extracardiac findings.   Past Medical History:  Diagnosis Date   Anxiety    Arthritis    Asthma    Bronchitis, acute      Family History  Problem Relation Age of Onset   Asthma Mother    Brain cancer Father    Colon cancer Neg Hx      Social History    Social History Narrative   Not on file     Review of Systems  Constitutional:  Negative for chills, fever, malaise/fatigue and weight loss.  HENT:  Negative for hearing loss, sinus pain and sore throat.   Respiratory:  Negative for cough, hemoptysis and shortness of breath.   Cardiovascular:  Negative for chest pain, palpitations, leg swelling and PND.  Gastrointestinal:  Negative for abdominal pain, constipation, diarrhea, heartburn, nausea and vomiting.  Genitourinary:  Negative for dysuria, frequency and urgency.  Musculoskeletal:  Negative for back pain, myalgias and neck pain.  Skin:  Negative for itching and rash.  Neurological:  Negative for dizziness, tingling, seizures and headaches.  Endo/Heme/Allergies:  Negative for polydipsia.  Psychiatric/Behavioral:  Negative for depression. The patient is not nervous/anxious.       Objective:   Vitals: BP 116/76   Pulse 72   Temp 98.1 F (36.7 C) (Tympanic)   Ht 5' 2.5" (1.588 m)   Wt 141 lb 12.8 oz (64.3 kg)   SpO2 99%   BMI 25.52 kg/m   Physical Exam Vitals and nursing note reviewed.  Constitutional:      General: She is not in acute distress.    Appearance: Normal appearance. She is not ill-appearing or toxic-appearing.  HENT:     Head: Normocephalic and atraumatic.     Right Ear: Hearing, tympanic membrane, ear canal and external ear normal.  Left Ear: Hearing, tympanic membrane, ear canal and external ear normal.     Mouth/Throat:     Pharynx: Oropharynx is clear.  Eyes:     Extraocular Movements: Extraocular movements intact.     Pupils: Pupils are equal, round, and reactive to light.  Neck:     Thyroid: No thyroid mass, thyromegaly or thyroid tenderness.     Vascular: No carotid bruit.  Cardiovascular:     Rate and Rhythm: Normal rate and regular rhythm. No extrasystoles are present.    Pulses:          Dorsalis pedis pulses are 1+ on the right side and 1+ on the left side.     Heart sounds: Normal  heart sounds. No murmur heard.    No friction rub. No gallop.  Pulmonary:     Effort: Pulmonary effort is normal.     Breath sounds: Normal breath sounds. No decreased breath sounds, wheezing, rhonchi or rales.  Chest:     Chest wall: No mass.  Abdominal:     Palpations: Abdomen is soft. There is no hepatomegaly, splenomegaly or mass.     Tenderness: There is no abdominal tenderness.     Hernia: No hernia is present.  Musculoskeletal:     Cervical back: Normal range of motion.     Right knee: Normal. No crepitus.     Left knee: Normal. No crepitus.     Right lower leg: No edema.     Left lower leg: No edema.  Lymphadenopathy:     Cervical: No cervical adenopathy.     Upper Body:     Right upper body: No supraclavicular adenopathy.     Left upper body: No supraclavicular adenopathy.  Skin:    General: Skin is warm and dry.  Neurological:     General: No focal deficit present.     Mental Status: She is alert and oriented to person, place, and time. Mental status is at baseline.     Sensory: Sensation is intact.     Motor: Motor function is intact. No weakness.     Deep Tendon Reflexes: Reflexes are normal and symmetric.  Psychiatric:        Attention and Perception: Attention normal.        Mood and Affect: Mood normal.        Speech: Speech normal.        Behavior: Behavior normal.        Thought Content: Thought content normal.        Cognition and Memory: Cognition normal.        Judgment: Judgment normal.      Most recent functional status assessment:    11/22/2022   10:00 AM  In your present state of health, do you have any difficulty performing the following activities:  Hearing? 1  Vision? 0  Difficulty concentrating or making decisions? 0  Walking or climbing stairs? 0  Dressing or bathing? 0  Doing errands, shopping? 0  Preparing Food and eating ? N  Using the Toilet? N  In the past six months, have you accidently leaked urine? Y  Do you have problems with  loss of bowel control? N  Managing your Medications? N  Managing your Finances? N  Housekeeping or managing your Housekeeping? N   Most recent fall risk assessment:    11/22/2022    9:58 AM  Fall Risk   Falls in the past year? 0  Number falls in past yr: 0  Injury with Fall? 0  Risk for fall due to : No Fall Risks  Follow up Falls prevention discussed    Most recent depression screenings:    11/22/2022    9:58 AM 05/10/2022    9:46 AM  PHQ 2/9 Scores  PHQ - 2 Score 0 0   Most recent cognitive screening:    11/22/2022   10:11 AM  6CIT Screen  What Year? 0 points  What month? 0 points  What time? 0 points  Count back from 20 0 points  Months in reverse 0 points  Repeat phrase 0 points  Total Score 0 points     Results:   Studies obtained and personally reviewed by me:  Mammogram last completed 09/07/22. No mammographic evidence of malignancy. Recommended repeat in 2026. Was told that one of her implants is leaking. Breast center will monitor on a regular basis. She is not inclined to replace it.  Colonoscopy last completed 01/01/14. Results showed normal colon, mild diverticulosis of left colon. Recommended repeat in 2025.  08/16/22 CT coronary calcium showed lungs, airways normal, no significant extracardiac findings.    Labs:       Component Value Date/Time   NA 140 11/21/2022 0944   K 4.9 11/21/2022 0944   CL 103 11/21/2022 0944   CO2 29 11/21/2022 0944   GLUCOSE 89 11/21/2022 0944   BUN 26 (H) 11/21/2022 0944   CREATININE 0.81 11/21/2022 0944   CALCIUM 9.6 11/21/2022 0944   PROT 6.6 11/21/2022 0944   ALBUMIN 4.3 10/18/2021 1022   AST 21 11/21/2022 0944   ALT 16 11/21/2022 0944   ALKPHOS 57 10/18/2021 1022   BILITOT 0.4 11/21/2022 0944   GFRNONAA >60 10/18/2021 1022   GFRNONAA 76 11/03/2020 0932   GFRAA 88 11/03/2020 0932     Lab Results  Component Value Date   WBC 4.3 11/21/2022   HGB 13.8 11/21/2022   HCT 42.4 11/21/2022   MCV 86.2 11/21/2022    PLT 346 11/21/2022    Lab Results  Component Value Date   CHOL 226 (H) 11/21/2022   HDL 104 11/21/2022   LDLCALC 102 (H) 11/21/2022   TRIG 107 11/21/2022   CHOLHDL 2.2 11/21/2022    Lab Results  Component Value Date   HGBA1C 5.5 08/24/2021     Lab Results  Component Value Date   TSH 2.94 11/21/2022    Assessment & Plan:   Arthritis: treated with Mobic 7.5 mg daily as needed.  Anxiety: treated with Xanax 0.25 - 0.5 mg twice daily as needed.  Insomnia: treated with Ambien 10 mg at bedtime as needed.  Mild persistent asthma: seen by allergist, Dr. Selena Batten. Stable with Symbicort, albuterol as needed.  Hearing loss: Will contact ENT office.  Urinary incontinence: continues with Myrbetriq.  Elevated cholesterol: HDL at 104, CHOL elevated at 226 due to high HDL, LDL at 102.   Hx of osteoporosis of spine treated with Boniva. Continue bone density study q 2 years.  Pelvic deferred.  Mammogram: last completed 09/07/22. No mammographic evidence of malignancy. Recommended repeat in 2026. Was told that one of her implants is leaking. Breast center will monitor on a regular basis. She is not inclined to replace it.  Colonoscopy: last completed 01/01/14. Results showed normal colon, mild diverticulosis of left colon. Recommended repeat in 2025.  08/16/22 CT coronary calcium showed lungs, airways normal, no significant extracardiac findings. Score was 2. Does not want to be on statin due to myalgias. This is reasonable given  her score.  Vaccine counseling: Will call pharmacy for shingles, Covid-19 vaccine dates.  Return in 1 year for health maintenance exam or as needed.     Annual wellness visit done today including the all of the following: Reviewed patient's Family Medical History Reviewed and updated list of patient's medical providers Assessment of cognitive impairment was done Assessed patient's functional ability Established a written schedule for health screening  services Health Risk Assessent Completed and Reviewed  Discussed health benefits of physical activity, and encouraged her to engage in regular exercise appropriate for her age and condition.        I,Alexander Ruley,acting as a Neurosurgeon for Margaree Mackintosh, MD.,have documented all relevant documentation on the behalf of Margaree Mackintosh, MD,as directed by  Margaree Mackintosh, MD while in the presence of Margaree Mackintosh, MD.   I, Margaree Mackintosh, MD, have reviewed all documentation for this visit. The documentation on 12/07/22 for the exam, diagnosis, procedures, and orders are all accurate and complete.

## 2022-11-17 NOTE — Therapy (Signed)
OUTPATIENT PHYSICAL THERAPY LOWER EXTREMITY TREATMENT NOTE/ ERO   Patient Name: Mary Garcia MRN: [id number] DOB:12-19-1945, 77 y.o., female Today's Date: 11/22/2022  END OF SESSION:  PT End of Session - 11/22/22 1143     Visit Number 14    Number of Visits 18    Date for PT Re-Evaluation 12/06/22    Authorization Type BC[employer]    Progress Note Due on Visit 20    PT Start Time 1100    PT Stop Time 1140    PT Time Calculation (min) 40 min    Activity Tolerance Patient tolerated treatment well    Behavior During Therapy WFL for tasks assessed/performed                       Past Medical History:  Diagnosis Date   Anxiety    Arthritis    Asthma    Bronchitis, acute    Past Surgical History:  Procedure Laterality Date   BREAST ENHANCEMENT SURGERY     CATARACT EXTRACTION W/ INTRAOCULAR LENS IMPLANT Bilateral    FACELIFT     local   ROTATOR CUFF REPAIR Right    TIBIA FRACTURE SURGERY  2004   left leg x3, tibia and fibula   TOTAL KNEE ARTHROPLASTY Left 10/18/2021   Procedure: TOTAL KNEE ARTHROPLASTY;  Surgeon: Dannielle Huh, MD;  Location: [facility];  Service: Orthopedics;  Laterality: Left;   TUMOR EXCISION  2003   right muscle back, benign   Patient Active Problem List   Diagnosis Date Noted   Seasonal allergic rhinitis due to pollen 05/17/2021   Mild persistent asthma without complication 06/03/2019   DDD (degenerative disc disease), cervical 09/15/2017   Depression 02/02/2016   Arthropathy of right shoulder 02/02/2016   Insomnia 04/13/2011   Osteoporosis 04/13/2011   Biliary dyskinesia 04/13/2011   Osteoarthritis 04/13/2011   History of migraine headaches 04/13/2011   Vitamin D deficiency 04/13/2011   Anxiety 04/13/2011   Irritable bowel syndrome 04/13/2011    PCP: Margaree Mackintosh, MD   REFERRING PROVIDER: Netta Cedars, MD   REFERRING DIAG:  Achilles tendinitis, right leg [M76.61]   THERAPY DIAG:  Pain in right ankle and joints of right  foot  Muscle weakness (generalized)  Difficulty in walking, not elsewhere classified  Rationale for Evaluation and Treatment: Rehabilitation  ONSET DATE: Started 02/11/22 and progressively worsened  SUBJECTIVE:   SUBJECTIVE STATEMENT: "[place] was wonderful and I had no pain.  I am still working on my strength in heel raises!"  PERTINENT HISTORY:  L  TKA 10-18-21 ,Osteoporosis 09-13-22  2/10  at end  1/10 09-27-22  1/10 10-04-22 at initiation 2/10 10-11-22  0/10  PAIN:  Are you having pain? Yes: NPRS scale: 0/10 Pain location: back of the ankle Pain description: burning, aching, sore, sharp / shoot.  Aggravating factors: any standing/ walking, descending steps Relieving factors: sitting/ resting  PRECAUTIONS: None  WEIGHT BEARING RESTRICTIONS: No  FALLS:  Has patient fallen in last 6 months? No  LIVING ENVIRONMENT: Lives with: lives with their spouse Lives in: House/apartment Stairs: Yes: Internal: 12 steps; on left going up and External: 2 steps; none Has following equipment at home:  [facility]   OCCUPATION: Retired  PLOF: Independent  PATIENT GOALS: stop having so much pain  NEXT MD VISIT:   OBJECTIVE:   DIAGNOSTIC FINDINGS: MRI at MD's office which pt provided report for Impression of r ankle MRI 07/11/2022 Severe distal achilles tendinosis with high-grade partial tearing of  the tendon at its calcaneal insertion. No significant associated tendon retraction Mild to moderate retrocalcanel bursitis with associated with haglund defomrityof the posterior calcaneus.  Nondisplaced split tearing of the peroneus longus with retromalleolar groove and peroneal brevis near the level of the peroneal tubercle Subacute/chronic partial tearing of the ATFL. Additional suspected chronic partial tearing/degenerationg of the superomedial of the calcanavicular spring ligament.  PATIENT SURVEYS:  FOTO 47% and predicted 61% 09/22/2024  65%  COGNITION: Overall cognitive status: Within  functional limits for tasks assessed     SENSATION: WFL  EDEMA:  Figure 8: R 49 cm, 47.cm   1-23- 24  RT 45cm, LT 45.25 cm  10-04-22 LT 44.25  POSTURE: rounded shoulders, forward head, and anterior pelvic tilt WNL   PALPATION: TTP along the posterior cancanela tubercle with associated swelling noted as a result of haglunds deformity. Multiple trigger points  in the R calf with specific soreness along the medial soleus.  LOWER EXTREMITY ROM:  Active/ passive ROM Right eval Left eval RT 09-27-22 RT 10-06-22 RT 10-11-22  Hip flexion       Hip extension       Hip abduction       Hip adduction       Hip internal rotation       Hip external rotation       Knee flexion       Knee extension       Ankle dorsiflexion A2/10P A9 A5/10P A8/P11 A8/P11  Ankle plantarflexion 68 51     Ankle inversion [facility]     Ankle eversion WFL WFL      (Blank rows = not tested)  LOWER EXTREMITY MMT:  MMT Right eval Left eval RT 09-16-22 LT 10-06-22 RT/LT 10-11-22  Hip flexion       Hip extension       Hip abduction       Hip adduction       Hip internal rotation       Hip external rotation       Knee flexion       Knee extension       Ankle dorsiflexion 5+ 5+     Ankle plantarflexion 3+ P! 5+4 8/25 loses range 20/25 9/20 out of 25  Ankle inversion 4+ 4+   4+  Ankle eversion 4+ 4+   4+  Posterior tibalis R 3=/5 P!    L 5 Peroneal brevis/ longus (4- with P!)  L 5   (Blank rows = not tested)  LOWER EXTREMITY SPECIAL TESTS:  N/A  FUNCTIONAL TESTS:  6 min walk test - 1669 ft outdoors   GAIT: Distance walked: 120 ft to tx room Assistive device utilized: None Level of assistance: Complete Independence Comments: decreased stride on RLE with antalgic pattern noted, with decreased toe off compared bil   TODAY'S TREATMENT:   [facility] Adult PT Treatment:                                                DATE: 11-22-22 SL on RT 15/25, LT  20/25 Therapeutic Exercise: Treadmill on incline at 15%,  gradually increasing speed from 1.5 to max of 3.0 mph   x  8 min. Releve to toe up against wall 25 x Heel raise bil 25 x  SL on RT 15/25, LT  20/25 Standing slant board stretch  2 x 30 sec gastroc/ soleus SLS heel raise to fatigue, moving to con bil/ ecc RL, with toes on 25 lb weighted plate Simulated uneven heights with forefoot placement on 25 # weighted plates and 8 inch step to floor Bil Heel raise on RT to deficit on 25 # weight plate and eccentric lowering Sled pushing 6 x with 50# verbal cues for proper form Toe walking 2 x 60 ft  [facility] Adult PT Treatment:                                                DATE: 11/01/22 Therapeutic Exercise: Treadmill on incline at 15%, gradually increasing speed from 1.5 to max of  x 6 min. Standing slant board stretch 2 x 30 sec gastroc/ soleus SLS heel raise to fatigue, moving to con bil/ ecc RL, the bil heel raise off 6 inch step going to fatigue for all phases x 2 sets Sled pushing 6 x with 50# verbal cues for proper form Toe walking 4 x 20 ft Manual Therapy: R talocrural distraction grade V with cavitation noted Neuromuscular re-ed: Squats on reverse bosu in // 2 x 15     [facility] Adult PT Treatment:                                                DATE: 10-20-22 Therapeutic Exercise:  Elliptical Warm-up and subjective taken Slant board calf stretch 2 x 30 sec with knee straight and bent Treadmill walking at 2.3 speed, x 6 min increasing incline x 2 every 2 min, using tactile cues (when dragging foot on tread as an indicator to pick up foot/ improve heel strike) Single leg PF on omega leg press drop sets to fatigue starting at 45# (20X), 35#(31), 25#(49), 20#(70) Wall sit with heel raise 2 x going to fatigue (max of 16 reps) RLE SLS  rocker board DF/PF 2 x going to fatigue, progressing to inversion/ eversion 1 x going to fatigue  Manual Therapy: IASTM along achilles tendon, gastroc/ soleus  [facility] Adult PT Treatment:                                                 DATE: 10/13/2022 Therapeutic Exercise: Treadmill walking at 2.2 speed, x 6 min increasing incline x 2 every 2 min, using tactile cues  VC for pick up foot/ improve heel strike Slant board calf stretch 2 x 30 sec with knee straight and bent RLE SLS  rocker board DF/PF 2 x going to fatigue, progressing to inversion/ eversion 1 x going to fatigue Wall sit with heel raise 2 x going to fatigue (max of 15 reps) Single leg PF on omega leg press drop sets to fatigue starting at 35#, 25#, 20#, 15#, 10#, 5#  Updated HEP for wall sit with heel raise.  Manual Therapy: IASTM along achilles tendon, gastroc/ soleus Active release techniques to the calf in prone Neuromuscular re-ed: Gait training with exaggerated heel strike/ toe off to promote knee extension/ flexion and maximize gait effiency 4 x 40 ft     PATIENT EDUCATION:  Education details: Evaluation findings, POC, Goals, HEP with proper form/ rationale.  Person educated: Patient Education method: Explanation, Verbal cues, and Handouts Education comprehension: verbalized understanding  HOME EXERCISE PROGRAM: Access Code: [email or web address] URL: [email or web address] Date: 10/13/2022 Prepared by: Lulu Riding  Program Notes 1) 3 way soleus heel raise 25 # weight in sitting.  Super important2) Do the black Theraband around ankle and do knee flexion and Dorsiflexion of ankle on step  3 way skier 30 x   Exercises - Seated Calf Stretch with Strap  - 3 x daily - 7 x weekly - 2 sets - 2 reps - 30 sec hold - Gastroc Stretch on Wall  - 3 x daily - 7 x weekly - 2 sets - 2 reps - 30 sec hold - Ankle Plantar flexion  - 1 x daily - 2 sets - 10 reps - Woodpeckers One Leg Every OTHER day  - 1 x daily - 7 x weekly - 2-3 sets - 10 reps - Seated Calf Raise with Weights on Thighs On Step  - 1 x daily - 7 x weekly - 3 sets - 10 reps - Seated Ankle Eversion with Resistance  - 1 x daily - 7 x weekly - 3 sets - 10 reps - Seated Figure  4 Ankle Inversion with Resistance  - 1 x daily - 7 x weekly - 3 sets - 10 reps - Wall Squat with Heel Raises  - 1 x daily - 7 x weekly - 2 sets - 15 reps  ASSESSMENT:  CLINICAL IMPRESSION: Mary Garcia arrives to PT today reporting no pain  and has returned from vacation in [place] in which she was able to hike.  Pt still with come deficit in SL heel raises on RT.  Overall pt has improved to a point she can use HEP and a personal trainer to maintain current strength and improve over time.  Mary Garcia will benefit from one more visit before DC and to DC subsequent visits if she continues with community wellness opportunities. Will continue to achieve LTG and DC after next visit goals assessed.  . End of session she denied any pain or issues.  OBJECTIVE IMPAIRMENTS: Abnormal gait, decreased activity tolerance, decreased balance, decreased endurance, decreased strength, increased edema, increased fascial restrictions, increased muscle spasms, improper body mechanics, postural dysfunction, and pain.   ACTIVITY LIMITATIONS: carrying, lifting, standing, squatting, and stairs  PARTICIPATION LIMITATIONS: driving, shopping, and community activity  PERSONAL FACTORS: Age, Past/current experiences, and 1 comorbidity: osteoporosis  are also affecting patient's functional outcome.   REHAB POTENTIAL: Good  CLINICAL DECISION MAKING: Evolving/moderate complexity  EVALUATION COMPLEXITY: Moderate   GOALS: Goals reviewed with patient? Yes  SHORT TERM GOALS: Target date: 09/19/2022   Pt to be IND with initial HEP for therapeutic progression Baseline: Goal status: MET  2.  Pt to report max pain to </= 5/10 to demo improving condition Baseline: 09-06-22  at worst 4/10 at night Goal status: MET  3.  Pt to demonstrate efficient gait pattern with report of </= 5/10 pain to reduce potential proximal kinetic chain problems Baseline: 09-06-22  has slight antalgic gait but now negotiating steps up and down with  alternating pattern 09-13-22   1/10 at end of RX  initial 2/10 Goal status: MET  4.  Pt to increase baseline FOTO score to >/= 56%  Baseline:  Goal status: MET 09/22/2022  5.  Reduce ankle swelling by >/= 1 cm to promote ankle mobility Baseline: RT 49, LT 47  09-06-22 RT 45cm, LT 45.25 cm Goal status:MET  LONG TERM GOALS: Target date: 12-06-22  Increase R ankle DF to >/= 8 degrees with no report of pain during assessment to assist with toe clearance with gait.  Baseline: 10-06-22 RT DF 6 PROM 11 10-11-22 8 AROM Goal status:MET  2.  Increase R ankle gross strength to >/= 4/5 with </=2.10 max pain during testing to demo improvement in ankle strength/ stability.  Baseline:  Goal status: ONGOING  3.  Increase FOTO score to >/= 61% to demo improvement in function Baseline: 09-22-22 65% Goal status MET  4.  Pt to report improvement in descending steps and heavy duty house chores/ ADLS with max pain of </= 1/61 pain Baseline: 10-06-22  improving steps but not greater than 8 inches in prep for hiking  10-11-22  Goal status: MET  5.  Pt be able to peform 6 min walk test achieve appropriate age specific distance of ~1545 ft with </= 2/10 max pain Baseline: 1669 ft outdoors  2/10 Goal status: MET     6. Pt will be able to walk 2 miles without exacerbating pain so she can be able to hike with family    Baseline 1.25 mile    Goal status: INITIAL (10/11/22)    7. Pt will be able to perform standing SL heel raise on RT 20/25 x    Baseline 8/25 with poor form    Goal Status; INTIAL (10/11/22)   PLAN:  PT FREQUENCY: 1-2x/week  PT DURATION: 8 weeks    PLANNED INTERVENTIONS: Therapeutic exercises, Therapeutic activity, Neuromuscular re-education, Balance training, Gait training, Patient/Family education, Self Care, Joint mobilization, Joint manipulation, Stair training, Aquatic Therapy, Dry Needling, Cryotherapy, Moist heat, Taping, Vasopneumatic device, Ultrasound, Ionotophoresis /ml  Dexamethasone, Manual therapy, and Re-evaluation  PLAN FOR NEXT SESSION: review/ update HEP PRN. Response to TPDN for the R calf continue PRN, ankle strengthening both intrinsic/ extrinsic, gait training.   AROM measures. Pt will continue 1 x a week for additional 6 weeks to increase SL stance strength and increase to 2 mile walking for preparation of hiking     Garen Lah, PT, [facility] Certified Exercise Expert for the Aging Adult  11/22/22 11:45 AM Phone: [phone] Fax: [phone]

## 2022-11-21 ENCOUNTER — Encounter: Payer: Self-pay | Admitting: Internal Medicine

## 2022-11-21 ENCOUNTER — Other Ambulatory Visit: Payer: Medicare Other

## 2022-11-21 ENCOUNTER — Ambulatory Visit: Payer: Medicare Other | Admitting: Internal Medicine

## 2022-11-21 VITALS — BP 108/68 | HR 70 | Ht 62.5 in | Wt 144.0 lb

## 2022-11-21 DIAGNOSIS — E559 Vitamin D deficiency, unspecified: Secondary | ICD-10-CM

## 2022-11-21 DIAGNOSIS — M810 Age-related osteoporosis without current pathological fracture: Secondary | ICD-10-CM

## 2022-11-21 DIAGNOSIS — R5383 Other fatigue: Secondary | ICD-10-CM

## 2022-11-21 DIAGNOSIS — F32A Depression, unspecified: Secondary | ICD-10-CM

## 2022-11-21 DIAGNOSIS — F419 Anxiety disorder, unspecified: Secondary | ICD-10-CM

## 2022-11-21 DIAGNOSIS — E7800 Pure hypercholesterolemia, unspecified: Secondary | ICD-10-CM | POA: Diagnosis not present

## 2022-11-21 NOTE — Addendum Note (Signed)
Addended by: Mary Sella D on: 11/21/2022 09:46 AM   Modules accepted: Orders

## 2022-11-21 NOTE — Patient Instructions (Addendum)
Please try to have a vitamin D checked by Dr. Lenord Fellers.  Please continue Boniva for now.  Please come back for a follow-up appointment in 1 year.
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
## 2022-11-22 ENCOUNTER — Ambulatory Visit (INDEPENDENT_AMBULATORY_CARE_PROVIDER_SITE_OTHER): Payer: Medicare Other | Admitting: Internal Medicine

## 2022-11-22 ENCOUNTER — Other Ambulatory Visit: Payer: Self-pay

## 2022-11-22 ENCOUNTER — Encounter: Payer: Self-pay | Admitting: Physical Therapy

## 2022-11-22 ENCOUNTER — Encounter: Payer: Self-pay | Admitting: Internal Medicine

## 2022-11-22 ENCOUNTER — Ambulatory Visit: Payer: Medicare Other | Attending: Internal Medicine | Admitting: Physical Therapy

## 2022-11-22 VITALS — BP 116/76 | HR 72 | Temp 98.1°F | Ht 62.5 in | Wt 141.8 lb

## 2022-11-22 DIAGNOSIS — R82998 Other abnormal findings in urine: Secondary | ICD-10-CM | POA: Diagnosis not present

## 2022-11-22 DIAGNOSIS — M25562 Pain in left knee: Secondary | ICD-10-CM | POA: Insufficient documentation

## 2022-11-22 DIAGNOSIS — M25571 Pain in right ankle and joints of right foot: Secondary | ICD-10-CM | POA: Insufficient documentation

## 2022-11-22 DIAGNOSIS — M6281 Muscle weakness (generalized): Secondary | ICD-10-CM

## 2022-11-22 DIAGNOSIS — H900 Conductive hearing loss, bilateral: Secondary | ICD-10-CM

## 2022-11-22 DIAGNOSIS — R600 Localized edema: Secondary | ICD-10-CM | POA: Diagnosis not present

## 2022-11-22 DIAGNOSIS — Z8249 Family history of ischemic heart disease and other diseases of the circulatory system: Secondary | ICD-10-CM

## 2022-11-22 DIAGNOSIS — N3941 Urge incontinence: Secondary | ICD-10-CM

## 2022-11-22 DIAGNOSIS — Z0000 Encounter for general adult medical examination without abnormal findings: Secondary | ICD-10-CM

## 2022-11-22 DIAGNOSIS — Z8709 Personal history of other diseases of the respiratory system: Secondary | ICD-10-CM

## 2022-11-22 DIAGNOSIS — R262 Difficulty in walking, not elsewhere classified: Secondary | ICD-10-CM | POA: Diagnosis not present

## 2022-11-22 DIAGNOSIS — Z96652 Presence of left artificial knee joint: Secondary | ICD-10-CM

## 2022-11-22 DIAGNOSIS — R7989 Other specified abnormal findings of blood chemistry: Secondary | ICD-10-CM

## 2022-11-22 DIAGNOSIS — M810 Age-related osteoporosis without current pathological fracture: Secondary | ICD-10-CM

## 2022-11-22 DIAGNOSIS — G4700 Insomnia, unspecified: Secondary | ICD-10-CM

## 2022-11-22 DIAGNOSIS — F419 Anxiety disorder, unspecified: Secondary | ICD-10-CM

## 2022-11-22 DIAGNOSIS — Z9882 Breast implant status: Secondary | ICD-10-CM

## 2022-11-22 DIAGNOSIS — J4520 Mild intermittent asthma, uncomplicated: Secondary | ICD-10-CM

## 2022-11-22 DIAGNOSIS — G8929 Other chronic pain: Secondary | ICD-10-CM | POA: Diagnosis not present

## 2022-11-22 LAB — POCT URINALYSIS DIPSTICK
Bilirubin, UA: NEGATIVE
Blood, UA: NEGATIVE
Glucose, UA: NEGATIVE
Ketones, UA: NEGATIVE
Nitrite, UA: POSITIVE
Protein, UA: NEGATIVE
Spec Grav, UA: 1.01 (ref 1.010–1.025)
Urobilinogen, UA: 0.2 E.U./dL
pH, UA: 6.5 (ref 5.0–8.0)

## 2022-11-22 LAB — LIPID PANEL
Cholesterol: 226 mg/dL — ABNORMAL HIGH (ref <200)
HDL: 104 mg/dL (ref >=50)
LDL Cholesterol (Calc): 102 mg/dL (calc) — ABNORMAL HIGH
Non-HDL Cholesterol (Calc): 122 mg/dL (calc) (ref <130)
Total CHOL/HDL Ratio: 2.2 (calc) (ref <5.0)
Triglycerides: 107 mg/dL (ref <150)

## 2022-11-22 LAB — COMPLETE METABOLIC PANEL WITH GFR
AG Ratio: 2 (calc) (ref 1.0–2.5)
ALT: 16 U/L (ref 6–29)
AST: 21 U/L (ref 10–35)
Albumin: 4.4 g/dL (ref 3.6–5.1)
Alkaline phosphatase (APISO): 70 U/L (ref 37–153)
BUN/Creatinine Ratio: 32 (calc) — ABNORMAL HIGH (ref 6–22)
BUN: 26 mg/dL — ABNORMAL HIGH (ref 7–25)
CO2: 29 mmol/L (ref 20–32)
Calcium: 9.6 mg/dL (ref 8.6–10.4)
Chloride: 103 mmol/L (ref 98–110)
Creat: 0.81 mg/dL (ref 0.60–1.00)
Globulin: 2.2 g/dL (calc) (ref 1.9–3.7)
Glucose, Bld: 89 mg/dL (ref 65–99)
Potassium: 4.9 mmol/L (ref 3.5–5.3)
Sodium: 140 mmol/L (ref 135–146)
Total Bilirubin: 0.4 mg/dL (ref 0.2–1.2)
Total Protein: 6.6 g/dL (ref 6.1–8.1)
eGFR: 76 mL/min/{1.73_m2} (ref >=60)

## 2022-11-22 LAB — CBC WITH DIFFERENTIAL/PLATELET
Absolute Monocytes: 353 cells/uL (ref 200–950)
Basophils Absolute: 22 cells/uL (ref 0–200)
Basophils Relative: 0.5 %
Eosinophils Absolute: 159 cells/uL (ref 15–500)
Eosinophils Relative: 3.7 %
HCT: 42.4 % (ref 35.0–45.0)
Hemoglobin: 13.8 g/dL (ref 11.7–15.5)
Lymphs Abs: 1187 cells/uL (ref 850–3900)
MCH: 28 pg (ref 27.0–33.0)
MCHC: 32.5 g/dL (ref 32.0–36.0)
MCV: 86.2 fL (ref 80.0–100.0)
MPV: 10.3 fL (ref 7.5–12.5)
Monocytes Relative: 8.2 %
Neutro Abs: 2580 cells/uL (ref 1500–7800)
Neutrophils Relative %: 60 %
Platelets: 346 10*3/uL (ref 140–400)
RBC: 4.92 10*6/uL (ref 3.80–5.10)
RDW: 13.6 % (ref 11.0–15.0)
Total Lymphocyte: 27.6 %
WBC: 4.3 10*3/uL (ref 3.8–10.8)

## 2022-11-22 LAB — TSH: TSH: 2.94 mIU/L (ref 0.40–4.50)

## 2022-11-22 LAB — VITAMIN D 25 HYDROXY (VIT D DEFICIENCY, FRACTURES): Vit D, 25-Hydroxy: 63 ng/mL (ref 30–100)

## 2022-11-22 MED ORDER — ALPRAZOLAM 0.5 MG PO TABS: ORAL_TABLET | ORAL | 3 refills | Status: DC

## 2022-11-23 DIAGNOSIS — H524 Presbyopia: Secondary | ICD-10-CM | POA: Diagnosis not present

## 2022-11-23 DIAGNOSIS — H43811 Vitreous degeneration, right eye: Secondary | ICD-10-CM | POA: Diagnosis not present

## 2022-11-24 ENCOUNTER — Other Ambulatory Visit: Payer: Self-pay

## 2022-11-24 LAB — URINE CULTURE
MICRO NUMBER:: 14800446
SPECIMEN QUALITY:: ADEQUATE

## 2022-11-24 MED ORDER — AMOXICILLIN-POT CLAVULANATE 250-125 MG PO TABS: 1.0000 | ORAL_TABLET | Freq: Three times a day (TID) | ORAL | 0 refills | Status: DC

## 2022-11-24 NOTE — Therapy (Signed)
OUTPATIENT PHYSICAL THERAPY LOWER EXTREMITY TREATMENT NOTE/ Discharge Note PHYSICAL THERAPY DISCHARGE SUMMARY  Visits from Start of Care: 15  Current functional level related to goals / functional outcomes: As indicated below   Remaining deficits:  pt able to return to recreation and hiking     Education / Equipment: HEP   Patient agrees to discharge. Patient goals were met. Patient is being discharged due to meeting the stated rehab goals. And being pleased with current functional progress   Patient Name: Mary Garcia MRN: [id number] DOB:1946/07/18, 77 y.o., female Today's Date: 11/29/2022  END OF SESSION:  PT End of Session - 11/29/22 0852     Visit Number 15    Number of Visits 18    Date for PT Re-Evaluation 12/06/22    PT Start Time 0850    PT Stop Time 0930    PT Time Calculation (min) 40 min    Activity Tolerance Patient tolerated treatment well    Behavior During Therapy WFL for tasks assessed/performed                        Past Medical History:  Diagnosis Date   Anxiety    Arthritis    Asthma    Bronchitis, acute    Past Surgical History:  Procedure Laterality Date   BREAST ENHANCEMENT SURGERY     CATARACT EXTRACTION W/ INTRAOCULAR LENS IMPLANT Bilateral    FACELIFT     local   ROTATOR CUFF REPAIR Right    TIBIA FRACTURE SURGERY  2004   left leg x3, tibia and fibula   TOTAL KNEE ARTHROPLASTY Left 10/18/2021   Procedure: TOTAL KNEE ARTHROPLASTY;  Surgeon: Dannielle Huh, MD;  Location: [facility];  Service: Orthopedics;  Laterality: Left;   TUMOR EXCISION  2003   right muscle back, benign   Patient Active Problem List   Diagnosis Date Noted   Seasonal allergic rhinitis due to pollen 05/17/2021   Mild persistent asthma without complication 06/03/2019   DDD (degenerative disc disease), cervical 09/15/2017   Depression 02/02/2016   Arthropathy of right shoulder 02/02/2016   Insomnia 04/13/2011   Osteoporosis 04/13/2011   Biliary dyskinesia  04/13/2011   Osteoarthritis 04/13/2011   History of migraine headaches 04/13/2011   Vitamin D deficiency 04/13/2011   Anxiety 04/13/2011   Irritable bowel syndrome 04/13/2011    PCP: Margaree Mackintosh, MD   REFERRING PROVIDER: Netta Cedars, MD   REFERRING DIAG:  Achilles tendinitis, right leg [M76.61]   THERAPY DIAG:  Pain in right ankle and joints of right foot  Muscle weakness (generalized)  Difficulty in walking, not elsewhere classified  Localized edema  Chronic pain of left knee  Rationale for Evaluation and Treatment: Rehabilitation  ONSET DATE: Started 02/11/22 and progressively worsened  SUBJECTIVE:   SUBJECTIVE STATEMENT: I am doing well.  I am continuing with Domingo Madeira my personal trainer  PERTINENT HISTORY:  L  TKA 10-18-21 ,Osteoporosis 09-13-22  2/10  at end  1/10 09-27-22  1/10 10-04-22 at initiation 2/10 10-11-22  0/10   PAIN:  Are you having pain? Yes: NPRS scale: 0/10 Pain location: back of the ankle Pain description: burning, aching, sore, sharp / shoot.  Aggravating factors: any standing/ walking, descending steps Relieving factors: sitting/ resting  PRECAUTIONS: None  WEIGHT BEARING RESTRICTIONS: No  FALLS:  Has patient fallen in last 6 months? No  LIVING ENVIRONMENT: Lives with: lives with their spouse Lives in: House/apartment Stairs: Yes: Internal: 12 steps; on  left going up and External: 2 steps; none Has following equipment at home:  [facility]   OCCUPATION: Retired  PLOF: Independent  PATIENT GOALS: stop having so much pain  NEXT MD VISIT:   OBJECTIVE:   DIAGNOSTIC FINDINGS: MRI at MD's office which pt provided report for Impression of r ankle MRI 07/11/2022 Severe distal achilles tendinosis with high-grade partial tearing of the tendon at its calcaneal insertion. No significant associated tendon retraction Mild to moderate retrocalcanel bursitis with associated with haglund defomrityof the posterior calcaneus.  Nondisplaced  split tearing of the peroneus longus with retromalleolar groove and peroneal brevis near the level of the peroneal tubercle Subacute/chronic partial tearing of the ATFL. Additional suspected chronic partial tearing/degenerationg of the superomedial of the calcanavicular spring ligament.  PATIENT SURVEYS:  FOTO 47% and predicted 61% 09/22/2024  65% 11-29-22 lower leg  99% 11-29-22 ankle 91%  COGNITION: Overall cognitive status: Within functional limits for tasks assessed     SENSATION: WFL  EDEMA:  Figure 8: R 49 cm, 47.cm   1-23- 24  RT 45cm, LT 45.25 cm  10-04-22 LT 44.25  POSTURE: rounded shoulders, forward head, and anterior pelvic tilt WNL   PALPATION: TTP along the posterior cancanela tubercle with associated swelling noted as a result of haglunds deformity. Multiple trigger points  in the R calf with specific soreness along the medial soleus.  LOWER EXTREMITY ROM:  Active/ passive ROM Right eval Left eval RT 09-27-22 RT 10-06-22 RT 10-11-22 RT 11-29-22  Hip flexion        Hip extension        Hip abduction        Hip adduction        Hip internal rotation        Hip external rotation        Knee flexion        Knee extension        Ankle dorsiflexion A2/10P A9 A5/10P A8/P11 A8/P11 A9/P12  Ankle plantarflexion 68 51      Ankle inversion [facility]      Ankle eversion WFL WFL       (Blank rows = not tested)  LOWER EXTREMITY MMT:  MMT Right eval Left eval RT 09-16-22 LT 10-06-22 RT/LT 10-11-22 RT/LT 11-29-22  Hip flexion        Hip extension        Hip abduction        Hip adduction        Hip internal rotation        Hip external rotation        Knee flexion        Knee extension        Ankle dorsiflexion 5+ 5+    5+  Ankle plantarflexion 3+ P! 5+4 8/25 loses range 20/25 9/20 out of 25 25/25  Ankle inversion 4+ 4+   4+ 5/5  Ankle eversion 4+ 4+   4+ 5/5  Posterior tibalis R 3=/5 P!    L 5 Peroneal brevis/ longus (4- with P!)  L 5   (Blank rows = not  tested)  LOWER EXTREMITY SPECIAL TESTS:  N/A  FUNCTIONAL TESTS:  6 min walk test - 1669 ft outdoors   GAIT: Distance walked: 120 ft to tx room Assistive device utilized: None Level of assistance: Complete Independence Comments: decreased stride on RLE with antalgic pattern noted, with decreased toe off compared bil   TODAY'S TREATMENT:   [facility] Adult PT Treatment:  DATE: 11-29-22 Therapeutic Exercise: Treadmill on incline at 15%, gradually increasing speed from 1.5 to max of 3.5 mph   x  7 min. Releve to toe up against wall 25 x Heel raise bil 25 x  SL on RT 25/25, LT  25/25 Standing slant board stretch 2 x 30 sec gastroc/ soleus Sled pushing 6 x with 50# verbal cues for proper form Toe walking 2 x 60 ft Simulating hiking on various heights Review necessary questions for  HEP Lateral steps ups with UE support for 18 inches step  [facility] Adult PT Treatment:                                                DATE: 11-22-22 SL on RT 15/25, LT  20/25 Therapeutic Exercise: Treadmill on incline at 15%, gradually increasing speed from 1.5 to max of 3.0 mph   x  8 min. Releve to toe up against wall 25 x Heel raise bil 25 x  SL on RT 15/25, LT  20/25 Standing slant board stretch 2 x 30 sec gastroc/ soleus SLS heel raise to fatigue, moving to con bil/ ecc RL, with toes on 25 lb weighted plate Simulated uneven heights with forefoot placement on 25 # weighted plates and 8 inch step to floor Bil Heel raise on RT to deficit on 25 # weight plate and eccentric lowering Sled pushing 6 x with 50# verbal cues for proper form Toe walking 2 x 60 ft  [facility] Adult PT Treatment:                                                DATE: 11/01/22 Therapeutic Exercise: Treadmill on incline at 15%, gradually increasing speed from 1.5 to max of  x 6 min. Standing slant board stretch 2 x 30 sec gastroc/ soleus SLS heel raise to fatigue, moving to con bil/ ecc RL, the bil  heel raise off 6 inch step going to fatigue for all phases x 2 sets Sled pushing 6 x with 50# verbal cues for proper form Toe walking 4 x 20 ft Manual Therapy: R talocrural distraction grade V with cavitation noted Neuromuscular re-ed: Squats on reverse bosu in // 2 x 15     [facility] Adult PT Treatment:                                                DATE: 10-20-22 Therapeutic Exercise:  Elliptical Warm-up and subjective taken Slant board calf stretch 2 x 30 sec with knee straight and bent Treadmill walking at 2.3 speed, x 6 min increasing incline x 2 every 2 min, using tactile cues (when dragging foot on tread as an indicator to pick up foot/ improve heel strike) Single leg PF on omega leg press drop sets to fatigue starting at 45# (20X), 35#(31), 25#(49), 20#(70) Wall sit with heel raise 2 x going to fatigue (max of 16 reps) RLE SLS  rocker board DF/PF 2 x going to fatigue, progressing to inversion/ eversion 1 x going to fatigue  Manual Therapy: IASTM along achilles tendon, gastroc/ soleus  [facility]  Adult PT Treatment:                                                DATE: 10/13/2022 Therapeutic Exercise: Treadmill walking at 2.2 speed, x 6 min increasing incline x 2 every 2 min, using tactile cues  VC for pick up foot/ improve heel strike Slant board calf stretch 2 x 30 sec with knee straight and bent RLE SLS  rocker board DF/PF 2 x going to fatigue, progressing to inversion/ eversion 1 x going to fatigue Wall sit with heel raise 2 x going to fatigue (max of 15 reps) Single leg PF on omega leg press drop sets to fatigue starting at 35#, 25#, 20#, 15#, 10#, 5#  Updated HEP for wall sit with heel raise.  Manual Therapy: IASTM along achilles tendon, gastroc/ soleus Active release techniques to the calf in prone Neuromuscular re-ed: Gait training with exaggerated heel strike/ toe off to promote knee extension/ flexion and maximize gait effiency 4 x 40 ft     PATIENT EDUCATION:  Education  details: Evaluation findings, POC, Goals, HEP with proper form/ rationale.  Person educated: Patient Education method: Explanation, Verbal cues, and Handouts Education comprehension: verbalized understanding  HOME EXERCISE PROGRAM: Access Code: [email or web address] URL: [email or web address] Date: 10/13/2022 Prepared by: Lulu Riding  Program Notes 1) 3 way soleus heel raise 25 # weight in sitting.  Super important2) Do the black Theraband around ankle and do knee flexion and Dorsiflexion of ankle on step  3 way skier 30 x   Exercises - Seated Calf Stretch with Strap  - 3 x daily - 7 x weekly - 2 sets - 2 reps - 30 sec hold - Gastroc Stretch on Wall  - 3 x daily - 7 x weekly - 2 sets - 2 reps - 30 sec hold - Ankle Plantar flexion  - 1 x daily - 2 sets - 10 reps - Woodpeckers One Leg Every OTHER day  - 1 x daily - 7 x weekly - 2-3 sets - 10 reps - Seated Calf Raise with Weights on Thighs On Step  - 1 x daily - 7 x weekly - 3 sets - 10 reps - Seated Ankle Eversion with Resistance  - 1 x daily - 7 x weekly - 3 sets - 10 reps - Seated Figure 4 Ankle Inversion with Resistance  - 1 x daily - 7 x weekly - 3 sets - 10 reps - Wall Squat with Heel Raises  - 1 x daily - 7 x weekly - 2 sets - 15 reps  ASSESSMENT:  CLINICAL IMPRESSION: Mrs Mars arrives to PT today reporting no pain  and is able to participate and not limited in her leisure or recreational pursuits including hiking. FOTO exceeded expectations with lower leg  99% and ankle 91%  Pt with normal strength and AROM in flow chart improved overall. All goals achieved and ready for DC. Overall pt has improved to a point she can use HEP and a personal trainer to maintain current strength and improve over time. . End of session she denied any pain or issues. All LTG achieved. Ms Jaskulski has been a delight for whom to serve   OBJECTIVE IMPAIRMENTS: Abnormal gait, decreased activity tolerance, decreased balance, decreased endurance, decreased  strength, increased edema, increased fascial restrictions, increased muscle spasms, improper body mechanics, postural dysfunction, and  pain.   ACTIVITY LIMITATIONS: carrying, lifting, standing, squatting, and stairs  PARTICIPATION LIMITATIONS: driving, shopping, and community activity  PERSONAL FACTORS: Age, Past/current experiences, and 1 comorbidity: osteoporosis  are also affecting patient's functional outcome.   REHAB POTENTIAL: Good  CLINICAL DECISION MAKING: Evolving/moderate complexity  EVALUATION COMPLEXITY: Moderate   GOALS: Goals reviewed with patient? Yes  SHORT TERM GOALS: Target date: 09/19/2022   Pt to be IND with initial HEP for therapeutic progression Baseline: Goal status: MET  2.  Pt to report max pain to </= 5/10 to demo improving condition Baseline: 09-06-22  at worst 4/10 at night Goal status: MET  3.  Pt to demonstrate efficient gait pattern with report of </= 5/10 pain to reduce potential proximal kinetic chain problems Baseline: 09-06-22  has slight antalgic gait but now negotiating steps up and down with alternating pattern 09-13-22   1/10 at end of RX  initial 2/10 Goal status: MET  4.  Pt to increase baseline FOTO score to >/= 56%  Baseline:  Goal status: MET 09/22/2022  5.  Reduce ankle swelling by >/= 1 cm to promote ankle mobility Baseline: RT 49, LT 47  09-06-22 RT 45cm, LT 45.25 cm Goal status:MET  LONG TERM GOALS: Target date: 12-06-22  Increase R ankle DF to >/= 8 degrees with no report of pain during assessment to assist with toe clearance with gait.  Baseline: 10-06-22 RT DF 6 PROM 11 10-11-22 8 AROM Status 11-29-22  9 DF and 12 PROM on R Goal status:MET  2.  Increase R ankle gross strength to >/= 4/5 with </=2.10 max pain during testing to demo improvement in ankle strength/ stability.  Baseline:  STatus  see flow chart Goal status: MET  3.  Increase FOTO score to >/= 61% to demo improvement in function Baseline: 09-22-22 65%  Status lower  leg  99% and ankle 91% Goal status MET 11-29-22  4.  Pt to report improvement in descending steps and heavy duty house chores/ ADLS with max pain of </= 1/09 pain Baseline: 10-06-22  improving steps but not greater than 8 inches in prep for hiking  10-11-22  Goal status: MET  5.  Pt be able to peform 6 min walk test achieve appropriate age specific distance of ~1545 ft with </= 2/10 max pain Baseline: 1669 ft outdoors  2/10 Goal status: MET     6. Pt will be able to walk 2 miles without exacerbating pain so she can be able to hike with family    Baseline 1.25 mile    Status: walking 2-miles  [place]    Goal status: MET 11-29-22    7. Pt will be able to perform standing SL heel raise on RT 20/25 x    Baseline 8/25 with poor form    Status  25/25    Goal Status; MET 11-29-22   PLAN:  PT FREQUENCY: 1-2x/week  PT DURATION: 8 weeks    PLANNED INTERVENTIONS: Therapeutic exercises, Therapeutic activity, Neuromuscular re-education, Balance training, Gait training, Patient/Family education, Self Care, Joint mobilization, Joint manipulation, Stair training, Aquatic Therapy, Dry Needling, Cryotherapy, Moist heat, Taping, Vasopneumatic device, Ultrasound, Ionotophoresis 4mg /ml Dexamethasone, Manual therapy, and Re-evaluation  PLAN FOR NEXT SESSION: review/ update HEP PRN. Response to TPDN for the R calf continue PRN, ankle strengthening both intrinsic/ extrinsic, gait training.   AROM measures. Pt will continue 1 x a week for additional 6 weeks to increase SL stance strength and increase to 2 mile walking for preparation of  hiking     Garen Lah, PT, [facility] Certified Exercise Expert for the Aging Adult  11/29/22 1:17 PM Phone: [phone] Fax: [phone]

## 2022-11-29 ENCOUNTER — Ambulatory Visit: Payer: Medicare Other | Admitting: Physical Therapy

## 2022-11-29 ENCOUNTER — Encounter: Payer: Self-pay | Admitting: Physical Therapy

## 2022-11-29 DIAGNOSIS — M6281 Muscle weakness (generalized): Secondary | ICD-10-CM | POA: Diagnosis not present

## 2022-11-29 DIAGNOSIS — R600 Localized edema: Secondary | ICD-10-CM | POA: Diagnosis not present

## 2022-11-29 DIAGNOSIS — M25571 Pain in right ankle and joints of right foot: Secondary | ICD-10-CM | POA: Diagnosis not present

## 2022-11-29 DIAGNOSIS — G8929 Other chronic pain: Secondary | ICD-10-CM | POA: Diagnosis not present

## 2022-11-29 DIAGNOSIS — R262 Difficulty in walking, not elsewhere classified: Secondary | ICD-10-CM

## 2022-11-29 DIAGNOSIS — K080 Exfoliation of teeth due to systemic causes: Secondary | ICD-10-CM | POA: Diagnosis not present

## 2022-11-29 DIAGNOSIS — M25562 Pain in left knee: Secondary | ICD-10-CM | POA: Diagnosis not present

## 2022-12-05 ENCOUNTER — Ambulatory Visit: Payer: Medicare Other

## 2022-12-06 ENCOUNTER — Encounter: Payer: Medicare Other | Admitting: Physical Therapy

## 2022-12-06 ENCOUNTER — Ambulatory Visit (INDEPENDENT_AMBULATORY_CARE_PROVIDER_SITE_OTHER): Payer: Medicare Other

## 2022-12-06 VITALS — BP 120/80 | HR 71 | Temp 97.9°F | Ht 62.5 in | Wt 141.0 lb

## 2022-12-06 DIAGNOSIS — B9689 Other specified bacterial agents as the cause of diseases classified elsewhere: Secondary | ICD-10-CM | POA: Diagnosis not present

## 2022-12-06 DIAGNOSIS — N390 Urinary tract infection, site not specified: Secondary | ICD-10-CM

## 2022-12-06 DIAGNOSIS — Z0189 Encounter for other specified special examinations: Secondary | ICD-10-CM

## 2022-12-06 LAB — POCT URINALYSIS DIPSTICK
Bilirubin, UA: NEGATIVE
Blood, UA: NEGATIVE
Glucose, UA: NEGATIVE
Ketones, UA: NEGATIVE
Leukocytes, UA: NEGATIVE
Nitrite, UA: NEGATIVE
Protein, UA: NEGATIVE
Spec Grav, UA: 1.01 (ref 1.010–1.025)
Urobilinogen, UA: 0.2 E.U./dL
pH, UA: 7 (ref 5.0–8.0)

## 2022-12-06 NOTE — Addendum Note (Signed)
Addended by: Margaree Mackintosh on: 12/06/2022 10:54 AM   Modules accepted: Level of Service

## 2022-12-06 NOTE — Progress Notes (Addendum)
Patient here for UTI follow -up today. She was here for Health Maintenance on 11/22/22 had abnormal urine specimen at that time and culture grew Klebsiella pneumoniae treated with  AUGMENTIN.  Vital signs are within normal limits and patient feeling much better.  I have reviewed this. Dipstick urine is now normal. MJB, MD  I, Margaree Mackintosh, MD, have reviewed all documentation for this visit. The documentation on 12/06/22 for the exam, diagnosis, procedures, and orders are all accurate and complete.

## 2022-12-06 NOTE — Addendum Note (Signed)
Addended by: Mary Sella D on: 12/06/2022 10:25 AM   Modules accepted: Orders

## 2022-12-06 NOTE — Progress Notes (Unsigned)
Follow Up Note  RE: Mary Garcia MRN: [id number] DOB: 06-02-1946 Date of Office Visit: 12/07/2022  Referring provider: Margaree Mackintosh, MD Primary care provider: Margaree Mackintosh, MD  Chief Complaint: No chief complaint on file.  History of Present Illness: I had the pleasure of seeing Mary Garcia for a follow up visit at the [facility] on 12/06/2022. She is a 77 y.o. female, who is being followed for asthma and allergic rhinitis. Her previous allergy office visit was on 06/08/2022 with Dr. Selena Batten. Today is a regular follow up visit.  Mild persistent asthma without complication Past history - Issues with coughing, wheezing and SOB for the past 3-4 years with worsening during URIs. Used to be on Flovent, Qvar in the past. Tried PPIs in the past with no benefit. 2020 skin testing showed: Borderline positive to maple tree pollen. 2020 spirometry showed: mild possible restrictive disease with 14% improvement in FEV post bronchodilator treatment.  Interim history - only used albuterol once, controlled, no prednisone. Up to date with flu and Covid-19 vaccines.  Today's spirometry was normal.  Daily controller medication(s): Symbicort 2 puffs once a day with spacer and rinse mouth afterwards. May use albuterol rescue inhaler 2 puffs or nebulizer every 4 to 6 hours as needed for shortness of breath, chest tightness, coughing, and wheezing. May use albuterol rescue inhaler 2 puffs 5 to 15 minutes prior to strenuous physical activities. Monitor frequency of use.  During upper respiratory infections/asthma flares: Start Symbicort 2 puffs twice a day for 1-2 weeks Get spirometry at next visit.   Seasonal allergic rhinitis due to pollen Past history - Perennial rhinitis symptoms for the past 8 years. 2020 skin testing was borderline positive to tree pollen.  Interim history - asymptomatic with no meds. Continue environmental control measures.  May use azelastine nasal spray 1-2  sprays per nostril 1-2 times a day as needed for runny nose. May use over the counter antihistamines such as Zyrtec (cetirizine), Claritin (loratadine), Allegra (fexofenadine), or Xyzal (levocetirizine) daily as needed. May take twice a day during flares.     Assessment and Plan: Mary Garcia is a 77 y.o. female with: No problem-specific Assessment & Plan notes found for this encounter.  No follow-ups on file.  No orders of the defined types were placed in this encounter.  Lab Orders  No laboratory test(s) ordered today    Diagnostics: Spirometry:  Tracings reviewed. Her effort: {Blank single:19197::"Good reproducible efforts.","It was hard to get consistent efforts and there is a question as to whether this reflects a maximal maneuver.","Poor effort, data can not be interpreted."} FVC: ***L FEV1: ***L, ***% predicted FEV1/FVC ratio: ***% Interpretation: {Blank single:19197::"Spirometry consistent with mild obstructive disease","Spirometry consistent with moderate obstructive disease","Spirometry consistent with severe obstructive disease","Spirometry consistent with possible restrictive disease","Spirometry consistent with mixed obstructive and restrictive disease","Spirometry uninterpretable due to technique","Spirometry consistent with normal pattern","No overt abnormalities noted given today's efforts"}.  Please see scanned spirometry results for details.  Skin Testing: {Blank single:19197::"Select foods","Environmental allergy panel","Environmental allergy panel and select foods","Food allergy panel","None","Deferred due to recent antihistamines use"}. *** Results discussed with patient/family.   Medication List:  Current Outpatient Medications  Medication Sig Dispense Refill   albuterol (VENTOLIN HFA) 108 (90 Base) MCG/ACT inhaler Inhale 2 puffs into the lungs every 4 (four) hours as needed for wheezing or shortness of breath (coughing fits). 18 g 1   ALPRAZolam (XANAX) 0.5 MG tablet  One half to one tab twice daily as needed  for anxiety. 60 tablet 3   amoxicillin-clavulanate (AUGMENTIN) 250-125 MG tablet Take 1 tablet by mouth 3 (three) times daily. 15 tablet 0   azelastine (ASTELIN) 0.1 % nasal spray Place 1-2 sprays in each nostril one to two times a day as needed for runny nose/drainage down throat 30 mL 5   budesonide-formoterol (SYMBICORT) 80-4.5 MCG/ACT inhaler Inhale 2 puffs into the lungs 2 (two) times daily. with spacer and rinse mouth afterwards. 10.2 g 5   Cholecalciferol (VITAMIN D) 50 MCG (2000 UT) tablet Take 2,000 Units by mouth 2 (two) times daily.     ELDERBERRY PO Take 575 mg by mouth 2 (two) times daily.     ibandronate (BONIVA) 150 MG tablet Take 1 tablet (150 mg total) by mouth every 30 (thirty) days. Take in the morning with a full glass of water, on an empty stomach, and do not take anything else by mouth or lie down for the next 30 min. 3 tablet 3   ketoconazole (NIZORAL) 2 % cream Apply 1 application topically 2 (two) times daily as needed (rash).     meloxicam (MOBIC) 7.5 MG tablet Take 7.5 mg by mouth daily as needed.     mirabegron ER (MYRBETRIQ) 25 MG TB24 tablet Take 1 tablet (25 mg total) by mouth daily. (Patient not taking: Reported on 08/22/2022) 30 tablet 1   Multiple Minerals-Vitamins (CITRACAL MAXIMUM PLUS) TABS Take 1 tablet by mouth daily.     tretinoin (RETIN-A) 0.1 % cream Apply 1 application topically at bedtime.     zolpidem (AMBIEN) 10 MG tablet TAKE 1 TABLET BY MOUTH EVERY DAY AT BEDTIME AS NEEDED FOR SLEEP 90 tablet 1   No current facility-administered medications for this visit.   Allergies: No Known Allergies I reviewed her past medical history, social history, family history, and environmental history and no significant changes have been reported from her previous visit.  Review of Systems  Constitutional:  Negative for appetite change, chills, fever and unexpected weight change.  HENT:  Negative for congestion, postnasal drip  and rhinorrhea.   Eyes:  Negative for itching.  Respiratory:  Negative for cough, chest tightness, shortness of breath and wheezing.   Cardiovascular:  Negative for chest pain.  Gastrointestinal:  Negative for abdominal pain.  Genitourinary:  Negative for difficulty urinating.  Skin:  Negative for rash.  Allergic/Immunologic: Positive for environmental allergies. Negative for food allergies.  Neurological:  Negative for headaches.    Objective: There were no vitals taken for this visit. There is no height or weight on file to calculate BMI. Physical Exam Vitals and nursing note reviewed.  Constitutional:      Appearance: Normal appearance. She is well-developed.  HENT:     Head: Normocephalic and atraumatic.     Right Ear: Tympanic membrane and external ear normal.     Left Ear: Tympanic membrane and external ear normal.     Nose: Nose normal.     Mouth/Throat:     Mouth: Mucous membranes are moist.     Pharynx: Oropharynx is clear.  Eyes:     Conjunctiva/sclera: Conjunctivae normal.  Cardiovascular:     Rate and Rhythm: Normal rate and regular rhythm.     Heart sounds: Normal heart sounds. No murmur heard.    No friction rub. No gallop.  Pulmonary:     Effort: Pulmonary effort is normal.     Breath sounds: Normal breath sounds. No wheezing or rales.  Musculoskeletal:     Cervical back: Neck supple.  Skin:    General: Skin is warm.     Findings: No rash.  Neurological:     Mental Status: She is alert and oriented to person, place, and time.  Psychiatric:        Mood and Affect: Mood normal.        Behavior: Behavior normal.    Previous notes and tests were reviewed. The plan was reviewed with the patient/family, and all questions/concerned were addressed.  It was my pleasure to see Mary Garcia today and participate in her care. Please feel free to contact me with any questions or concerns.  Sincerely,  Wyline Mood, DO Allergy & Immunology  [facility] office: [phone] [facility] office: [phone]

## 2022-12-07 ENCOUNTER — Other Ambulatory Visit: Payer: Self-pay

## 2022-12-07 ENCOUNTER — Ambulatory Visit: Payer: Medicare Other | Admitting: Allergy

## 2022-12-07 ENCOUNTER — Encounter: Payer: Self-pay | Admitting: Allergy

## 2022-12-07 VITALS — BP 116/82 | HR 72 | Temp 97.7°F | Resp 16 | Ht 62.0 in | Wt 138.5 lb

## 2022-12-07 DIAGNOSIS — J4530 Mild persistent asthma, uncomplicated: Secondary | ICD-10-CM

## 2022-12-07 DIAGNOSIS — J301 Allergic rhinitis due to pollen: Secondary | ICD-10-CM | POA: Diagnosis not present

## 2022-12-07 NOTE — Patient Instructions (Addendum)
Labs are stable. Colonoscopy due 2025. RTC in one year or as needed. No change in meds. Bone density due every other year. Vaccines discussed.

## 2022-12-07 NOTE — Patient Instructions (Addendum)
Mild persistent asthma Normal breathing test today.  Daily controller medication(s): decrease Symbicort to ONE puff once a day with spacer and rinse mouth afterwards. If you notice issues then go back up to 2 puffs. Will try for 1 puff once a day during the warmer months. 2 puffs once a day during the colder months.  During respiratory infections/flares:  Start Symbicort 2 puffs twice a day for 1-2 weeks until your breathing symptoms return to baseline.  Pretreat with albuterol 2 puffs or albuterol nebulizer.  If you need to use your albuterol nebulizer machine back to back within 15-30 minutes with no relief then please go to the ER/urgent care for further evaluation.  May use albuterol rescue inhaler 2 puffs or nebulizer every 4 to 6 hours as needed for shortness of breath, chest tightness, coughing, and wheezing. May use albuterol rescue inhaler 2 puffs 5 to 15 minutes prior to strenuous physical activities. Monitor frequency of use.  Breathing control goals:  Full participation in all desired activities (may need albuterol before activity) Albuterol use two times or less a week on average (not counting use with activity) Cough interfering with sleep two times or less a month Oral steroids no more than once a year No hospitalizations   Allergic rhinitis 2020 skin testing was borderline positive to tree pollen.  Continue environmental control measures.  May use azelastine nasal spray 1-2 sprays per nostril 1-2 times a day as needed for runny nose. May use over the counter antihistamines such as Zyrtec (cetirizine), Claritin (loratadine), Allegra (fexofenadine), or Xyzal (levocetirizine) daily as needed. May take twice a day during flares.   Follow up in 6 months or sooner if needed.  Reducing Pollen Exposure Pollen seasons: trees (spring), grass (summer) and ragweed/weeds (fall). Keep windows closed in your home and car to lower pollen exposure.  Install air conditioning  in the bedroom and throughout the house if possible.  Avoid going out in dry windy days - especially early morning. Pollen counts are highest between 5 - 10 AM and on dry, hot and windy days.  Save outside activities for late afternoon or after a heavy rain, when pollen levels are lower.  Avoid mowing of grass if you have grass pollen allergy. Be aware that pollen can also be transported indoors on people and pets.  Dry your clothes in an automatic dryer rather than hanging them outside where they might collect pollen.  Rinse hair and eyes before bedtime.

## 2022-12-07 NOTE — Assessment & Plan Note (Signed)
Past history - Issues with coughing, wheezing and SOB for the past 3-4 years with worsening during URIs. Used to be on Flovent, Qvar in the past. Tried PPIs in the past with no benefit. 2020 skin testing showed: Borderline positive to maple tree pollen. 2020 spirometry showed: mild possible restrictive disease with 14% improvement in FEV post bronchodilator treatment.  Interim history - no issues and rare albuterol use while in [place].  Today's spirometry was normal.  Daily controller medication(s): decrease Symbicort to ONE puff once a day with spacer and rinse mouth afterwards. If you notice issues then go back up to 2 puffs. Will try for 1 puff once a day during the warmer months. 2 puffs once a day during the colder months.  During respiratory infections/flares:  Start Symbicort 2 puffs twice a day for 1-2 weeks until your breathing symptoms return to baseline.  Pretreat with albuterol 2 puffs or albuterol nebulizer.  If you need to use your albuterol nebulizer machine back to back within 15-30 minutes with no relief then please go to the ER/urgent care for further evaluation.  May use albuterol rescue inhaler 2 puffs or nebulizer every 4 to 6 hours as needed for shortness of breath, chest tightness, coughing, and wheezing. May use albuterol rescue inhaler 2 puffs 5 to 15 minutes prior to strenuous physical activities. Monitor frequency of use.  Get spirometry at next visit.

## 2022-12-07 NOTE — Assessment & Plan Note (Signed)
Past history - Perennial rhinitis symptoms for the past 8 years. 2020 skin testing was borderline positive to tree pollen.  Interim history - asymptomatic with no meds.  Continue environmental control measures.   May use azelastine nasal spray 1-2 sprays per nostril 1-2 times a day as needed for runny nose.  May use over the counter antihistamines such as Zyrtec (cetirizine), Claritin (loratadine), Allegra (fexofenadine), or Xyzal (levocetirizine) daily as needed. May take twice a day during flares.  

## 2022-12-13 ENCOUNTER — Encounter: Payer: Medicare Other | Admitting: Physical Therapy

## 2022-12-28 ENCOUNTER — Other Ambulatory Visit: Payer: Self-pay | Admitting: Internal Medicine

## 2023-03-29 ENCOUNTER — Other Ambulatory Visit: Payer: Self-pay | Admitting: Internal Medicine

## 2023-05-02 DIAGNOSIS — K080 Exfoliation of teeth due to systemic causes: Secondary | ICD-10-CM | POA: Diagnosis not present

## 2023-05-31 DIAGNOSIS — K080 Exfoliation of teeth due to systemic causes: Secondary | ICD-10-CM | POA: Diagnosis not present

## 2023-06-02 ENCOUNTER — Other Ambulatory Visit: Payer: Self-pay

## 2023-06-02 MED ORDER — IBANDRONATE SODIUM 150 MG PO TABS: 150.0000 mg | ORAL_TABLET | ORAL | 3 refills | Status: DC

## 2023-06-13 DIAGNOSIS — M25531 Pain in right wrist: Secondary | ICD-10-CM | POA: Diagnosis not present

## 2023-06-14 ENCOUNTER — Encounter: Payer: Self-pay | Admitting: Allergy

## 2023-06-14 ENCOUNTER — Ambulatory Visit: Payer: Medicare Other | Admitting: Allergy

## 2023-06-14 ENCOUNTER — Other Ambulatory Visit: Payer: Self-pay

## 2023-06-14 VITALS — BP 110/70 | HR 72 | Temp 98.0°F | Resp 15

## 2023-06-14 DIAGNOSIS — J4530 Mild persistent asthma, uncomplicated: Secondary | ICD-10-CM

## 2023-06-14 DIAGNOSIS — J301 Allergic rhinitis due to pollen: Secondary | ICD-10-CM

## 2023-06-14 NOTE — Patient Instructions (Addendum)
Make sure you take all your medications/inhalers in your carry on bag.   Mild persistent asthma Normal breathing test today. Daily controller medication(s): Symbicort 2 puffs once a day with spacer and rinse mouth afterwards. 1 puff once a day during the warmer months. 2 puffs once a day during the colder months.  Let me know if not covered or too expensive.  During respiratory infections/flares:  Start Symbicort 2 puffs twice a day for 1-2 weeks until your breathing symptoms return to baseline.  May use albuterol rescue inhaler 2 puffs every 4 to 6 hours as needed for shortness of breath, chest tightness, coughing, and wheezing.  Monitor frequency of use - if you need to use it more than twice per week on a consistent basis let us know.  Breathing control goals:  Full participation in all desired activities (may need albuterol before activity) Albuterol use two times or less a week on average (not counting use with activity) Cough interfering with sleep two times or less a month Oral steroids no more than once a year No hospitalizations   Allergic rhinitis 2020 skin testing was borderline positive to tree pollen.  Continue environmental control measures.  May use azelastine nasal spray 1-2 sprays per nostril 1-2 times a day as needed for runny nose. May use over the counter antihistamines such as Zyrtec (cetirizine), Claritin (loratadine), Allegra (fexofenadine), or Xyzal (levocetirizine) daily as needed. May take twice a day during flares.   Recommend not using benadryl every night as a sleeping aid. Follow up with PCP regarding your sleeping issues.  Follow up in 6 months or sooner if needed.  Reducing Pollen Exposure Pollen seasons: trees (spring), grass (summer) and ragweed/weeds (fall). Keep windows closed in your home and car to lower pollen exposure.  Install air conditioning in the bedroom and throughout the house if possible.  Avoid going out in dry windy days  - especially early morning. Pollen counts are highest between 5 - 10 AM and on dry, hot and windy days.  Save outside activities for late afternoon or after a heavy rain, when pollen levels are lower.  Avoid mowing of grass if you have grass pollen allergy. Be aware that pollen can also be transported indoors on people and pets.  Dry your clothes in an automatic dryer rather than hanging them outside where they might collect pollen.  Rinse hair and eyes before bedtime.

## 2023-06-14 NOTE — Progress Notes (Signed)
Follow Up Note  RE: Mary Garcia MRN: [id number] DOB: 07/09/1946 Date of Office Visit: 06/14/2023  Referring provider: Margaree Mackintosh, MD Primary care provider: Margaree Mackintosh, MD  Chief Complaint: Asthma  History of Present Illness: I had the pleasure of seeing Mary Garcia for a follow up visit at the [facility] on 06/14/2023. She is a 77 y.o. female, who is being followed for asthma, allergic rhinitis. Her previous allergy office visit was on 12/07/2022 with Dr. Selena Batten. Today is a regular follow up visit.  Discussed the use of AI scribe software for clinical note transcription with the patient, who gave verbal consent to proceed.  The patient reports a stable condition with no recent asthma exacerbations. She has been managing her symptoms with Symbicort, currently at a reduced dosage of one puff in the morning, with no reported problems. She has not experienced any recent episodes of shortness of breath or other respiratory distress, even during a recent cruise around [place] where she was exposed to COVID-19 but did not contract the virus.  The patient's breathing test results have shown a slight decline compared to previous results, but she reports feeling well overall. She has agreed to increase her Symbicort dosage to two puffs once a day during the colder months, as she recalls these months typically exacerbate her symptoms.  In addition to her respiratory condition, the patient has been experiencing issues with sleep and has been attempting to wean off Ambien. She has been supplementing with Benadryl to aid sleep, but she was not aware of the potential long-term risks associated with this practice.  The patient also reports a recent onset of tendonitis in her wrist, which she attributes to her weightlifting routine.   The patient has not required any emergency or urgent care visits, nor has she needed a course of prednisone since her last appointment. She has received  her flu and COVID-19 vaccines. She has not reported any significant changes in her medication regimen, surgeries, or medical diagnoses.  Patient going to [place] in December.     Assessment and Plan: Mary Garcia is a 77 y.o. female with: Mild persistent asthma without complication Past history - coughing, wheezing and SOB for 3-4 years with worsening during URIs. Used to be on Flovent, Qvar. Tried PPIs with no benefit. 2020 skin testing borderline positive to maple tree pollen. 2020 spirometry mild possible restrictive disease with 14% improvement in FEV post bronchodilator treatment.  Interim history - well controlled. Today's spirometry was normal albeit not as good as previous ones.  Daily controller medication(s): Symbicort 2 puffs once a day with spacer and rinse mouth afterwards. 1 puff once a day during the warmer months. 2 puffs once a day during the colder months.  Let me know if not covered or too expensive.  During respiratory infections/flares:  Start Symbicort 2 puffs twice a day for 1-2 weeks until your breathing symptoms return to baseline.  May use albuterol rescue inhaler 2 puffs every 4 to 6 hours as needed for shortness of breath, chest tightness, coughing, and wheezing.  Monitor frequency of use - if you need to use it more than twice per week on a consistent basis let us know.   Seasonal allergic rhinitis due to pollen Past history - Perennial rhinitis symptoms. 2020 skin testing borderline positive to tree pollen.  Interim history - stable.  Continue environmental control measures.  May use azelastine nasal spray 1-2 sprays per nostril 1-2 times  a day as needed for runny nose. May use over the counter antihistamines such as Zyrtec (cetirizine), Claritin (loratadine), Allegra (fexofenadine), or Xyzal (levocetirizine) daily as needed. May take twice a day during flares.    Recommend not using benadryl every night as a sleeping aid. Follow up with PCP regarding your  sleeping issues.  Return in about 6 months (around 12/13/2023).  No orders of the defined types were placed in this encounter.  Lab Orders  No laboratory test(s) ordered today   Diagnostics: Spirometry:  Tracings reviewed. Her effort: Good reproducible efforts. FVC: 2.00L FEV1: 1.43L, 74% predicted FEV1/FVC ratio: 72% Interpretation: Spirometry consistent with normal pattern - slightly worse than previous.  Please see scanned spirometry results for details.  Medication List:  Current Outpatient Medications  Medication Sig Dispense Refill   albuterol (VENTOLIN HFA) 108 (90 Base) MCG/ACT inhaler Inhale 2 puffs into the lungs every 4 (four) hours as needed for wheezing or shortness of breath (coughing fits). 18 g 1   ALPRAZolam (XANAX) 0.5 MG tablet One half to one tab twice daily as needed for anxiety. 60 tablet 3   azelastine (ASTELIN) 0.1 % nasal spray Place 1-2 sprays in each nostril one to two times a day as needed for runny nose/drainage down throat 30 mL 5   budesonide-formoterol (SYMBICORT) 80-4.5 MCG/ACT inhaler Inhale 2 puffs into the lungs 2 (two) times daily. with spacer and rinse mouth afterwards. (Patient taking differently: Inhale 1 puff into the lungs daily. with spacer and rinse mouth afterwards.) 10.2 g 5   Cholecalciferol (VITAMIN D) 50 MCG (2000 UT) tablet Take 2,000 Units by mouth 2 (two) times daily.     diphenhydrAMINE (BENADRYL) 25 mg capsule Take 25 mg by mouth as needed for sleep.     ELDERBERRY PO Take 575 mg by mouth 2 (two) times daily.     ibandronate (BONIVA) 150 MG tablet Take 1 tablet (150 mg total) by mouth every 30 (thirty) days. Take in the morning with a full glass of water, on an empty stomach, and do not take anything else by mouth or lie down for the next 30 min. 3 tablet 3   ketoconazole (NIZORAL) 2 % cream Apply 1 application topically 2 (two) times daily as needed (rash).     Multiple Minerals-Vitamins (CITRACAL MAXIMUM PLUS) TABS Take 1 tablet by  mouth daily.     tretinoin (RETIN-A) 0.1 % cream Apply 1 application topically at bedtime.     zolpidem (AMBIEN) 10 MG tablet TAKE 1 TABLET BY MOUTH EVERY DAY AT BEDTIME AS NEEDED FOR SLEEP 90 tablet 0   No current facility-administered medications for this visit.   Allergies: No Known Allergies I reviewed her past medical history, social history, family history, and environmental history and no significant changes have been reported from her previous visit.  Review of Systems  Constitutional:  Negative for appetite change, chills, fever and unexpected weight change.  HENT:  Negative for congestion, postnasal drip and rhinorrhea.   Eyes:  Negative for itching.  Respiratory:  Negative for cough, chest tightness, shortness of breath and wheezing.   Cardiovascular:  Negative for chest pain.  Gastrointestinal:  Negative for abdominal pain.  Genitourinary:  Negative for difficulty urinating.  Skin:  Negative for rash.  Allergic/Immunologic: Positive for environmental allergies. Negative for food allergies.  Neurological:  Negative for headaches.    Objective: BP 110/70 (BP Location: Left Arm, Patient Position: Sitting, Cuff Size: Normal)   Pulse 72   Temp 98 F (36.7  C) (Temporal)   Resp 15   SpO2 98%  There is no height or weight on file to calculate BMI. Physical Exam Vitals and nursing note reviewed.  Constitutional:      Appearance: Normal appearance. She is well-developed.  HENT:     Head: Normocephalic and atraumatic.     Right Ear: Tympanic membrane and external ear normal.     Left Ear: Tympanic membrane and external ear normal.     Nose: Nose normal.     Mouth/Throat:     Mouth: Mucous membranes are moist.     Pharynx: Oropharynx is clear.  Eyes:     Conjunctiva/sclera: Conjunctivae normal.  Cardiovascular:     Rate and Rhythm: Normal rate and regular rhythm.     Heart sounds: Normal heart sounds. No murmur heard.    No friction rub. No gallop.  Pulmonary:      Effort: Pulmonary effort is normal.     Breath sounds: Normal breath sounds. No wheezing or rales.  Musculoskeletal:     Cervical back: Neck supple.  Skin:    General: Skin is warm.     Findings: No rash.  Neurological:     Mental Status: She is alert and oriented to person, place, and time.  Psychiatric:        Mood and Affect: Mood normal.        Behavior: Behavior normal.    Previous notes and tests were reviewed. The plan was reviewed with the patient/family, and all questions/concerned were addressed.  It was my pleasure to see Mary Garcia today and participate in her care. Please feel free to contact me with any questions or concerns.  Sincerely,  Wyline Mood, DO Allergy & Immunology  [facility] office: [phone] [facility] office: [phone]

## 2023-07-03 ENCOUNTER — Other Ambulatory Visit: Payer: Self-pay | Admitting: Internal Medicine

## 2023-07-11 DIAGNOSIS — M25531 Pain in right wrist: Secondary | ICD-10-CM | POA: Diagnosis not present

## 2023-07-17 ENCOUNTER — Encounter: Payer: Self-pay | Admitting: Internal Medicine

## 2023-07-17 ENCOUNTER — Ambulatory Visit (INDEPENDENT_AMBULATORY_CARE_PROVIDER_SITE_OTHER): Payer: Medicare Other | Admitting: Internal Medicine

## 2023-07-17 VITALS — BP 120/80 | HR 72 | Temp 97.9°F | Ht 62.0 in | Wt 138.0 lb

## 2023-07-17 DIAGNOSIS — H6503 Acute serous otitis media, bilateral: Secondary | ICD-10-CM | POA: Diagnosis not present

## 2023-07-17 DIAGNOSIS — J069 Acute upper respiratory infection, unspecified: Secondary | ICD-10-CM

## 2023-07-17 MED ORDER — AZITHROMYCIN 250 MG PO TABS: ORAL_TABLET | ORAL | 0 refills | Status: AC

## 2023-07-17 NOTE — Progress Notes (Signed)
Patient Care Team: Margaree Mackintosh, MD as PCP - General (Internal Medicine) Ellamae Sia, DO as Consulting Physician (Allergy)  Visit Date: 07/17/23  Subjective:    Patient ID: Mary Garcia , Female   DOB: Nov 08, 1945, 77 y.o.    MRN: [id number]   77 y.o. Female presents today for cough, chest tightness for one week. Symptoms worse at night. Has some sputum production in the morning after waking. Using Symbicort inhaler. She is active during the day. She is traveling to [place] on 07/21/23.  Past Medical History:  Diagnosis Date   Anxiety    Arthritis    Asthma    Bronchitis, acute      Family History  Problem Relation Age of Onset   Asthma Mother    Brain cancer Father    Colon cancer Neg Hx     Social History   Social History Narrative   Not on file      Review of Systems  Constitutional:  Negative for fever and malaise/fatigue.  HENT:  Negative for congestion and ear pain.   Eyes:  Negative for blurred vision.  Respiratory:  Positive for cough and sputum production. Negative for shortness of breath.   Cardiovascular:  Positive for chest pain (Tightness). Negative for palpitations and leg swelling.  Gastrointestinal:  Negative for vomiting.  Musculoskeletal:  Negative for back pain.  Skin:  Negative for rash.  Neurological:  Negative for loss of consciousness and headaches.        Objective:   Vitals: BP 120/80   Pulse 72   Temp 97.9 F (36.6 C)   Ht 5\' 2"  (1.575 m)   Wt 138 lb (62.6 kg)   SpO2 96%   BMI 25.24 kg/m    Physical Exam Vitals and nursing note reviewed.  Constitutional:      General: She is not in acute distress.    Appearance: Normal appearance. She is not toxic-appearing.  HENT:     Head: Normocephalic and atraumatic.     Right Ear: Hearing, ear canal and external ear normal.     Left Ear: Hearing, ear canal and external ear normal.     Ears:     Comments: TMs slightly full bilaterally.    Mouth/Throat:     Pharynx: Oropharynx is  clear. No oropharyngeal exudate.  Pulmonary:     Effort: Pulmonary effort is normal. No respiratory distress.     Breath sounds: Normal breath sounds. No wheezing or rales.  Skin:    General: Skin is warm and dry.  Neurological:     Mental Status: She is alert and oriented to person, place, and time. Mental status is at baseline.  Psychiatric:        Mood and Affect: Mood normal.        Behavior: Behavior normal.        Thought Content: Thought content normal.        Judgment: Judgment normal.       Results:   Studies obtained and personally reviewed by me:   Labs:       Component Value Date/Time   NA 140 11/21/2022 0944   K 4.9 11/21/2022 0944   CL 103 11/21/2022 0944   CO2 29 11/21/2022 0944   GLUCOSE 89 11/21/2022 0944   BUN 26 (H) 11/21/2022 0944   CREATININE 0.81 11/21/2022 0944   CALCIUM 9.6 11/21/2022 0944   PROT 6.6 11/21/2022 0944   ALBUMIN 4.3 10/18/2021 1022   AST 21  11/21/2022 0944   ALT 16 11/21/2022 0944   ALKPHOS 57 10/18/2021 1022   BILITOT 0.4 11/21/2022 0944   GFRNONAA >60 10/18/2021 1022   GFRNONAA 76 11/03/2020 0932   GFRAA 88 11/03/2020 0932     Lab Results  Component Value Date   WBC 4.3 11/21/2022   HGB 13.8 11/21/2022   HCT 42.4 11/21/2022   MCV 86.2 11/21/2022   PLT 346 11/21/2022    Lab Results  Component Value Date   CHOL 226 (H) 11/21/2022   HDL 104 11/21/2022   LDLCALC 102 (H) 11/21/2022   TRIG 107 11/21/2022   CHOLHDL 2.2 11/21/2022    Lab Results  Component Value Date   HGBA1C 5.5 08/24/2021     Lab Results  Component Value Date   TSH 2.94 11/21/2022      Assessment & Plan:   Acute upper respiratory infection / bilateral serous otitis media: prescribed Z-Pak two tabs day 1 followed by one tab days 2-5.  Vaccine counseling: UTD on flu, Covid-19 boosters. Recommended pneumococcal-20 vaccine.    I,Alexander Ruley,acting as a Neurosurgeon for Margaree Mackintosh, MD.,have documented all relevant documentation on the  behalf of Margaree Mackintosh, MD,as directed by  Margaree Mackintosh, MD while in the presence of Margaree Mackintosh, MD.   I, Margaree Mackintosh, MD, have reviewed all documentation for this visit. The documentation on 07/19/23 for the exam, diagnosis, procedures, and orders are all accurate and complete.

## 2023-07-19 NOTE — Patient Instructions (Addendum)
Take Zithromax 2 tabs day 1 followed by one 5 days 2-5. Rest and stay well hydrated. Call if not better in one week or sooner if worse.

## 2023-08-03 ENCOUNTER — Other Ambulatory Visit: Payer: Self-pay | Admitting: Allergy

## 2023-08-08 DIAGNOSIS — M654 Radial styloid tenosynovitis [de Quervain]: Secondary | ICD-10-CM | POA: Diagnosis not present

## 2023-09-04 ENCOUNTER — Other Ambulatory Visit: Payer: Self-pay | Admitting: Allergy

## 2023-09-05 ENCOUNTER — Telehealth (INDEPENDENT_AMBULATORY_CARE_PROVIDER_SITE_OTHER): Payer: Medicare Other | Admitting: Internal Medicine

## 2023-09-05 ENCOUNTER — Ambulatory Visit: Payer: Self-pay | Admitting: Internal Medicine

## 2023-09-05 ENCOUNTER — Encounter: Payer: Self-pay | Admitting: Internal Medicine

## 2023-09-05 VITALS — Temp 99.9°F | Ht 62.0 in | Wt 138.0 lb

## 2023-09-05 DIAGNOSIS — R197 Diarrhea, unspecified: Secondary | ICD-10-CM

## 2023-09-05 DIAGNOSIS — J069 Acute upper respiratory infection, unspecified: Secondary | ICD-10-CM | POA: Diagnosis not present

## 2023-09-05 MED ORDER — HYDROCODONE BIT-HOMATROP MBR 5-1.5 MG/5ML PO SOLN: 5.0000 mL | Freq: Three times a day (TID) | ORAL | 0 refills | Status: DC | PRN

## 2023-09-05 NOTE — Telephone Encounter (Signed)
Copied from CRM [id number]. Topic: Clinical - Red Word Triage >> Sep 05, 2023  9:29 AM Maxwell Marion wrote: Red Word that prompted transfer to Nurse Triage: tightness in chest, congested cough, experiencing chills as well. Started Sunday. Also experiencing some nausea and diarrhea. Grandkids had the norovirus last week  Chief Complaint: cough Symptoms: Cough, chest congestion, yesterday nausea, diarrhea, chills, hot flashes Frequency: Sunday cold s/s  yesterday started nausea and diarrhea Pertinent Negatives: Patient denies vomiting Disposition: [] ED /[] Urgent Care (no appt availability in office) / [x] Appointment(In office/virtual)/ []  [facility]/ [] Home Care/ [] Refused Recommended Disposition /[] [facility]/ []  Follow-up with PCP Additional Notes: started a z.pak yesterday. Pt had exposure to norovirus  Reason for Disposition  [1] Sinus congestion (pressure, fullness) AND [2] present > 10 days    Pt c/o norovirus exposure & symptoms and cough, congestion, chills, hot flashes  Answer Assessment - Initial Assessment Questions 1. ONSET: "When did the nasal discharge start?"      Sunday cold s/s  yesterday started nausea and diarrhea 2. AMOUNT: "How much discharge is there?"      some 3. COUGH: "Do you have a cough?" If Yes, ask: "Describe the color of your sputum" (clear, white, yellow, green)     Greenish brown 4. RESPIRATORY DISTRESS: "Describe your breathing."      no 5. FEVER: "Do you have a fever?" If Yes, ask: "What is your temperature, how was it measured, and when did it start?"     no 6. SEVERITY: "Overall, how bad are you feeling right now?" (e.g., doesn't interfere with normal activities, staying home from school/work, staying in bed)      moderate 7. OTHER SYMPTOMS: "Do you have any other symptoms?" (e.g., sore throat, earache, wheezing, vomiting)     Cough, chest congestion, yesterday nausea, diarrhea, chills, hot flashes 8. PREGNANCY: "Is there any chance  you are pregnant?" "When was your last menstrual period?"     N/a  Protocols used: Common Cold-A-AH

## 2023-09-05 NOTE — Progress Notes (Signed)
Patient Care Team: Margaree Mackintosh, MD as PCP - General (Internal Medicine) Ellamae Sia, DO as Consulting Physician (Allergy)  I connected with Mary Garcia on 09/05/23 at 10:15 AM by video enabled telemedicine visit and verified that I am speaking with the correct person using two identifiers.   I discussed the limitations, risks, security and privacy concerns of performing an evaluation and management service by telemedicine and the availability of in-person appointments. I also discussed with the patient that there may be a patient responsible charge related to this service. The patient expressed understanding and agreed to proceed.   Other persons participating in the visit and their role in the encounter: Medical scribe, Larey Brick  Patient's location: Home  Provider's location: Clinic   I provided 15 minutes of face-to-face video visit time during this encounter including medical decision making and e-scribing medication, and > 50% was spent counseling as documented under my assessment & plan.   Chief Complaint:  Chief Complaint  Patient presents with   Cough    Started on Sunday, coughing up greenish brown.    Nasal Congestion    Grand kids had the norovirus this weekend, she started vomiting yesterday.     Subjective:    Patient ID: Mary Garcia , Female    DOB: Nov 30, 1945, 78 y.o.    MRN: [id number]   78 y.o. Female presents today for sick visit with Cough and Nasal Congestion. Patient has a past medical history of Acute Bronchitis. Endorses exposure to grand-kids with Noro-virus and symptoms beginning Sunday. Describes symptoms as weakness, cough with expectoration of greenish-brown mucus, nasal congestion, headache, stomach cramps yesterday, nausea yesterday, and 12-14 bowel movements last night. Reports that she started a ZPAK yesterday that she'd had prescribed in case she became ill [place], and she is feeling more improved since yesterday. Has been staying  hydrated with Gatorade and water. Using her inhalers, Breyna and Albuterol, for her respiratory symptoms. She notes that her symptoms feel similar to when she was ill with bronchitis.     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 One son.  Review of Systems  Constitutional:  Negative for fever and malaise/fatigue.  HENT:  Positive for congestion.   Eyes:  Negative for blurred vision.  Respiratory:  Positive for cough and sputum production. Negative for shortness of breath.   Cardiovascular:  Negative for chest pain, palpitations and leg swelling.  Gastrointestinal:  Positive for diarrhea (12-14 last night) and nausea. Negative for vomiting.  Genitourinary:        (+) Stomach Cramps  Musculoskeletal:  Negative for back pain.  Skin:  Negative for rash.  Neurological:  Positive for weakness and headaches. Negative for loss of consciousness.   Objective:   Vitals: Temp 99.9 F (37.7 C)   Ht 5\' 2"  (1.575 m)   Wt 138 lb (62.6 kg)   BMI 25.24 kg/m   Physical Exam Vitals and nursing note reviewed.  Constitutional:      General: She is not in acute distress.    Appearance: Normal appearance. She is not toxic-appearing.  HENT:     Head:  Normocephalic and atraumatic.  Pulmonary:     Effort: Pulmonary effort is normal.  Skin:    General: Skin is warm and dry.  Neurological:     Mental Status: She is alert and oriented to person, place, and time. Mental status is at baseline.  Psychiatric:        Mood and Affect: Mood normal.        Behavior: Behavior normal.        Thought Content: Thought content normal.        Judgment: Judgment normal.     Results:  Studies obtained and personally reviewed by  me: Labs:      Component Value Date/Time   NA 140 11/21/2022 0944   K 4.9 11/21/2022 0944   CL 103 11/21/2022 0944   CO2 29 11/21/2022 0944   GLUCOSE 89 11/21/2022 0944   BUN 26 (H) 11/21/2022 0944   CREATININE 0.81 11/21/2022 0944   CALCIUM 9.6 11/21/2022 0944   PROT 6.6 11/21/2022 0944   ALBUMIN 4.3 10/18/2021 1022   AST 21 11/21/2022 0944   ALT 16 11/21/2022 0944   ALKPHOS 57 10/18/2021 1022   BILITOT 0.4 11/21/2022 0944   GFRNONAA >60 10/18/2021 1022   GFRNONAA 76 11/03/2020 0932   GFRAA 88 11/03/2020 0932     Lab Results  Component Value Date   WBC 4.3 11/21/2022   HGB 13.8 11/21/2022   HCT 42.4 11/21/2022   MCV 86.2 11/21/2022   PLT 346 11/21/2022    Lab Results  Component Value Date   CHOL 226 (H) 11/21/2022   HDL 104 11/21/2022   LDLCALC 102 (H) 11/21/2022   TRIG 107 11/21/2022   CHOLHDL 2.2 11/21/2022    Lab Results  Component Value Date   HGBA1C 5.5 08/24/2021     Lab Results  Component Value Date   TSH 2.94 11/21/2022   Assessment & Plan:   Acute Lower Respiratory Infection: continue ZPAK. Sending in Hycodan syrup for cough - take 1 teaspoon every 8 hours as needed. Stay well rested, well hydrated, and well nourished. Walk around often and breath deeply to prevent mucus settling into your lungs causing respiratory distress. CXR ordered after discussion with patient.  Gastroenteritis: Continue clear liquids for 24-48 hours and then advance diet slowly   I,Emily Lagle,acting as a scribe for Margaree Mackintosh, MD.,have documented all relevant documentation on the behalf of Margaree Mackintosh, MD,as directed by  Margaree Mackintosh, MD while in the presence of Margaree Mackintosh, MD.   I, Margaree Mackintosh, MD, have reviewed all documentation for this visit. The documentation on 09/05/23 for the exam, diagnosis, procedures, and orders are all accurate and complete.

## 2023-09-05 NOTE — Patient Instructions (Addendum)
You have been diagnosed with an acute upper respiratory infection and viral gastroenteritis.  Please stay with clear liquids until diarrhea resolves and then you may advance diet slowly.  A chest x-ray has been ordered to determine if you have pneumonia.  Please take Zithromax Z-PAK 2 tabs day 1 followed by 1 tab days 2 through 5.  You may take Hycodan 1 teaspoon every 8 hours as needed for cough or sore throat pain.  Please let us know if not improved in 5 to 7 days or sooner if worse.

## 2023-09-06 ENCOUNTER — Ambulatory Visit
Admission: RE | Admit: 2023-09-06 | Discharge: 2023-09-06 | Disposition: A | Discharge status: Home / Self Care | Payer: Medicare Other | Source: Ambulatory Visit | Attending: Internal Medicine | Admitting: Internal Medicine

## 2023-09-06 ENCOUNTER — Telehealth: Payer: Self-pay | Admitting: Internal Medicine

## 2023-09-06 DIAGNOSIS — J22 Unspecified acute lower respiratory infection: Secondary | ICD-10-CM

## 2023-09-06 DIAGNOSIS — R0989 Other specified symptoms and signs involving the circulatory and respiratory systems: Secondary | ICD-10-CM | POA: Diagnosis not present

## 2023-09-06 DIAGNOSIS — R059 Cough, unspecified: Secondary | ICD-10-CM | POA: Diagnosis not present

## 2023-09-06 MED ORDER — FLUCONAZOLE 150 MG PO TABS: 150.0000 mg | ORAL_TABLET | Freq: Once | ORAL | 0 refills | Status: AC

## 2023-09-06 MED ORDER — METHYLPREDNISOLONE 4 MG PO TABS: ORAL_TABLET | ORAL | 0 refills | Status: DC

## 2023-09-06 MED ORDER — AZITHROMYCIN 250 MG PO TABS: ORAL_TABLET | ORAL | 0 refills | Status: AC

## 2023-09-06 NOTE — Telephone Encounter (Signed)
Called patient to let her know Dr Lenord Fellers had called in 3 medications for her. She had a Z-pak at home and had already started taking it, so I let her know to go ahead and take the prednisone and the other one was if she got a yeast infection and then to hold onto the Zpak but not to start it since she was almost done with the one she had started, and to call back if she needed [place]. She verbalized understanding.

## 2023-09-06 NOTE — Telephone Encounter (Signed)
Phone call to patient to report CXR is negative for pneumonia. Has inhaler. Still has a lot of cough and hoarseness. Has Hycodan for cough. Sending in Z-pak and Medrol 4 mg dosepack  to take in tapering course. Rest and stay inside during the cold weather. Monitor pulse ox. Walk about the house some to keep lungs open and aerated.

## 2023-09-12 ENCOUNTER — Telehealth: Payer: Self-pay | Admitting: Allergy

## 2023-09-12 NOTE — Telephone Encounter (Signed)
Patient called stating when she went to go pick up her [facility] prescription the price went from 40 dollars to now 140 dollars. Patient is wanting to know of a cheaper alternative.

## 2023-09-12 NOTE — Telephone Encounter (Signed)
Called patient - DOB verified - advised of provider notation below.  Patient was given the alternatives to contact her insurance/pharmacy to see which is most cot effective for her - then either contact the office or send provider a myChart with her choice.  Patient verbalized understanding to all, no further questions.

## 2023-09-12 NOTE — Telephone Encounter (Signed)
Please call patient back.  I tried to order a different inhaler but they all came back as preferred level 1 including the [place] and it won't tell me how much it will cost her.   Unfortunately, she will have to go through the below options with her pharmacy insurance and see which one is the cheapest for her now.  Check the pricing for the following inhalers: Dulera Advair HFA Advair Diskus [facility] Symbicort

## 2023-09-12 NOTE — Telephone Encounter (Signed)
Please advise on another inhaler that is compatible with Mary Garcia as patient isn't able to afford the cost increase of the inhaler.  Thank you.

## 2023-09-13 DIAGNOSIS — Z1231 Encounter for screening mammogram for malignant neoplasm of breast: Secondary | ICD-10-CM | POA: Diagnosis not present

## 2023-09-13 LAB — HM MAMMOGRAPHY

## 2023-09-14 ENCOUNTER — Encounter: Payer: Self-pay | Admitting: Internal Medicine

## 2023-09-14 DIAGNOSIS — D2230 Melanocytic nevi of unspecified part of face: Secondary | ICD-10-CM | POA: Diagnosis not present

## 2023-09-14 DIAGNOSIS — L570 Actinic keratosis: Secondary | ICD-10-CM | POA: Diagnosis not present

## 2023-09-14 DIAGNOSIS — D2272 Melanocytic nevi of left lower limb, including hip: Secondary | ICD-10-CM | POA: Diagnosis not present

## 2023-09-14 DIAGNOSIS — D2361 Other benign neoplasm of skin of right upper limb, including shoulder: Secondary | ICD-10-CM | POA: Diagnosis not present

## 2023-09-28 ENCOUNTER — Other Ambulatory Visit: Payer: Self-pay | Admitting: Internal Medicine

## 2023-10-10 ENCOUNTER — Other Ambulatory Visit: Payer: Self-pay | Admitting: Allergy

## 2023-10-16 ENCOUNTER — Telehealth: Payer: Self-pay | Admitting: Internal Medicine

## 2023-10-16 NOTE — Telephone Encounter (Signed)
 Copied from CRM [id number]. Topic: Appointments - Appointment Cancel/Reschedule >> Oct 16, 2023 11:17 AM Elmarie Shiley S wrote: Patient/patient representative is calling to cancel or reschedule an appointment. Refer to attachments for appointment information.   Tuesday 4/1 or Wednesday 4/3  Patient requesting lab appt to be rescheduled for either dates above. Patient is going out of town on Thursday

## 2023-10-16 NOTE — Telephone Encounter (Signed)
Lab rescheduled 

## 2023-11-14 ENCOUNTER — Other Ambulatory Visit

## 2023-11-14 ENCOUNTER — Other Ambulatory Visit: Payer: Self-pay

## 2023-11-14 DIAGNOSIS — Z0000 Encounter for general adult medical examination without abnormal findings: Secondary | ICD-10-CM

## 2023-11-14 DIAGNOSIS — E559 Vitamin D deficiency, unspecified: Secondary | ICD-10-CM

## 2023-11-14 DIAGNOSIS — G0439 Other acute necrotizing hemorrhagic encephalopathy: Secondary | ICD-10-CM | POA: Diagnosis not present

## 2023-11-14 DIAGNOSIS — E7800 Pure hypercholesterolemia, unspecified: Secondary | ICD-10-CM

## 2023-11-14 NOTE — Progress Notes (Signed)
 Lab only

## 2023-11-15 LAB — CBC WITH DIFFERENTIAL/PLATELET
Absolute Lymphocytes: 980 {cells}/uL (ref 850–3900)
Absolute Monocytes: 331 {cells}/uL (ref 200–950)
Basophils Absolute: 29 {cells}/uL (ref 0–200)
Basophils Relative: 0.5 %
Eosinophils Absolute: 41 {cells}/uL (ref 15–500)
Eosinophils Relative: 0.7 %
HCT: 41 % (ref 35.0–45.0)
Hemoglobin: 13.7 g/dL (ref 11.7–15.5)
MCH: 29.1 pg (ref 27.0–33.0)
MCHC: 33.4 g/dL (ref 32.0–36.0)
MCV: 87.2 fL (ref 80.0–100.0)
MPV: 10.5 fL (ref 7.5–12.5)
Monocytes Relative: 5.7 %
Neutro Abs: 4420 {cells}/uL (ref 1500–7800)
Neutrophils Relative %: 76.2 %
Platelets: 317 10*3/uL (ref 140–400)
RBC: 4.7 10*6/uL (ref 3.80–5.10)
RDW: 13.9 % (ref 11.0–15.0)
Total Lymphocyte: 16.9 %
WBC: 5.8 10*3/uL (ref 3.8–10.8)

## 2023-11-15 LAB — COMPLETE METABOLIC PANEL WITHOUT GFR
AG Ratio: 1.9 (calc) (ref 1.0–2.5)
ALT: 13 U/L (ref 6–29)
AST: 20 U/L (ref 10–35)
Albumin: 4.2 g/dL (ref 3.6–5.1)
Alkaline phosphatase (APISO): 66 U/L (ref 37–153)
BUN: 21 mg/dL (ref 7–25)
CO2: 27 mmol/L (ref 20–32)
Calcium: 9.7 mg/dL (ref 8.6–10.4)
Chloride: 105 mmol/L (ref 98–110)
Creat: 0.75 mg/dL (ref 0.60–1.00)
Globulin: 2.2 g/dL (ref 1.9–3.7)
Glucose, Bld: 98 mg/dL (ref 65–99)
Potassium: 4.8 mmol/L (ref 3.5–5.3)
Sodium: 141 mmol/L (ref 135–146)
Total Bilirubin: 0.5 mg/dL (ref 0.2–1.2)
Total Protein: 6.4 g/dL (ref 6.1–8.1)

## 2023-11-15 LAB — LIPID PANEL
Cholesterol: 213 mg/dL — ABNORMAL HIGH (ref <200)
HDL: 109 mg/dL (ref >=50)
LDL Cholesterol (Calc): 87 mg/dL
Non-HDL Cholesterol (Calc): 104 mg/dL (ref <130)
Total CHOL/HDL Ratio: 2 (calc) (ref <5.0)
Triglycerides: 76 mg/dL (ref <150)

## 2023-11-15 LAB — TSH: TSH: 1.95 m[IU]/L (ref 0.40–4.50)

## 2023-11-15 LAB — VITAMIN D 25 HYDROXY (VIT D DEFICIENCY, FRACTURES): Vit D, 25-Hydroxy: 75 ng/mL (ref 30–100)

## 2023-11-16 ENCOUNTER — Other Ambulatory Visit: Payer: Medicare Other

## 2023-11-21 ENCOUNTER — Ambulatory Visit: Payer: Medicare Other | Admitting: Internal Medicine

## 2023-11-21 ENCOUNTER — Encounter: Payer: Self-pay | Admitting: Internal Medicine

## 2023-11-21 VITALS — BP 122/70 | HR 85 | Ht 62.0 in | Wt 144.0 lb

## 2023-11-21 DIAGNOSIS — E559 Vitamin D deficiency, unspecified: Secondary | ICD-10-CM | POA: Diagnosis not present

## 2023-11-21 DIAGNOSIS — M810 Age-related osteoporosis without current pathological fracture: Secondary | ICD-10-CM
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
## 2023-11-21 NOTE — Patient Instructions (Addendum)
 Please continue Boniva for now.  Let's check another bone density scan.  Let me know the total amount of vit. D that you take in a day.  Please come back for a follow-up appointment in 1 year.

## 2023-11-23 ENCOUNTER — Ambulatory Visit (INDEPENDENT_AMBULATORY_CARE_PROVIDER_SITE_OTHER): Payer: Medicare Other | Admitting: Internal Medicine

## 2023-11-23 ENCOUNTER — Encounter: Payer: Self-pay | Admitting: Internal Medicine

## 2023-11-23 VITALS — BP 122/80 | HR 71 | Temp 99.0°F | Ht 63.0 in | Wt 143.0 lb

## 2023-11-23 DIAGNOSIS — R7989 Other specified abnormal findings of blood chemistry: Secondary | ICD-10-CM | POA: Diagnosis not present

## 2023-11-23 DIAGNOSIS — G4700 Insomnia, unspecified: Secondary | ICD-10-CM

## 2023-11-23 DIAGNOSIS — F419 Anxiety disorder, unspecified: Secondary | ICD-10-CM | POA: Diagnosis not present

## 2023-11-23 DIAGNOSIS — Z0000 Encounter for general adult medical examination without abnormal findings: Secondary | ICD-10-CM

## 2023-11-23 DIAGNOSIS — J4520 Mild intermittent asthma, uncomplicated: Secondary | ICD-10-CM | POA: Diagnosis not present

## 2023-11-23 DIAGNOSIS — M810 Age-related osteoporosis without current pathological fracture: Secondary | ICD-10-CM

## 2023-11-23 DIAGNOSIS — Z9882 Breast implant status: Secondary | ICD-10-CM

## 2023-11-23 DIAGNOSIS — M79645 Pain in left finger(s): Secondary | ICD-10-CM | POA: Diagnosis not present

## 2023-11-23 DIAGNOSIS — E7800 Pure hypercholesterolemia, unspecified: Secondary | ICD-10-CM

## 2023-11-23 DIAGNOSIS — Z8249 Family history of ischemic heart disease and other diseases of the circulatory system: Secondary | ICD-10-CM

## 2023-11-23 DIAGNOSIS — J683 Other acute and subacute respiratory conditions due to chemicals, gases, fumes and vapors: Secondary | ICD-10-CM

## 2023-11-23 DIAGNOSIS — Z96652 Presence of left artificial knee joint: Secondary | ICD-10-CM

## 2023-11-23 LAB — POCT URINALYSIS DIP (CLINITEK)
Bilirubin, UA: NEGATIVE
Blood, UA: NEGATIVE
Glucose, UA: NEGATIVE mg/dL
Ketones, POC UA: NEGATIVE mg/dL
Leukocytes, UA: NEGATIVE
Nitrite, UA: NEGATIVE
Spec Grav, UA: 1.01 (ref 1.010–1.025)
Urobilinogen, UA: 0.2 U/dL
pH, UA: 6 (ref 5.0–8.0)

## 2023-11-23 MED ORDER — HYDROCORTISONE 2.5 % EX CREA: TOPICAL_CREAM | Freq: Two times a day (BID) | CUTANEOUS | 0 refills | Status: DC

## 2023-11-28 DIAGNOSIS — M79645 Pain in left finger(s): Secondary | ICD-10-CM | POA: Diagnosis not present

## 2023-11-29 DIAGNOSIS — H43811 Vitreous degeneration, right eye: Secondary | ICD-10-CM | POA: Diagnosis not present

## 2023-12-03 ENCOUNTER — Encounter: Payer: Self-pay | Admitting: Internal Medicine

## 2023-12-03 NOTE — Patient Instructions (Addendum)
 It was a pleasure to see you today. Labs are stable. Continue same medications and return in one year or as needed. Medical issues appear stable and well controlled on current medications. Vaccines discussed. Continue your exercise regimen.

## 2023-12-13 ENCOUNTER — Ambulatory Visit: Payer: Medicare Other | Admitting: Allergy

## 2023-12-14 DIAGNOSIS — G459 Transient cerebral ischemic attack, unspecified: Secondary | ICD-10-CM

## 2023-12-14 HISTORY — DX: Transient cerebral ischemic attack, unspecified: G45.9

## 2023-12-19 DIAGNOSIS — M20012 Mallet finger of left finger(s): Secondary | ICD-10-CM | POA: Diagnosis not present

## 2023-12-20 DIAGNOSIS — M8588 Other specified disorders of bone density and structure, other site: Secondary | ICD-10-CM | POA: Diagnosis not present

## 2023-12-20 DIAGNOSIS — Z7952 Long term (current) use of systemic steroids: Secondary | ICD-10-CM | POA: Diagnosis not present

## 2023-12-20 DIAGNOSIS — Z8262 Family history of osteoporosis: Secondary | ICD-10-CM | POA: Diagnosis not present

## 2023-12-20 DIAGNOSIS — M810 Age-related osteoporosis without current pathological fracture: Secondary | ICD-10-CM | POA: Diagnosis not present

## 2023-12-20 LAB — HM DEXA SCAN

## 2023-12-21 ENCOUNTER — Other Ambulatory Visit: Payer: Self-pay | Admitting: Internal Medicine

## 2023-12-22 ENCOUNTER — Encounter: Payer: Self-pay | Admitting: Internal Medicine

## 2023-12-28 ENCOUNTER — Other Ambulatory Visit: Payer: Self-pay | Admitting: Allergy

## 2024-01-09 DIAGNOSIS — M79645 Pain in left finger(s): Secondary | ICD-10-CM | POA: Diagnosis not present

## 2024-01-09 DIAGNOSIS — M20012 Mallet finger of left finger(s): Secondary | ICD-10-CM | POA: Diagnosis not present

## 2024-01-09 NOTE — Progress Notes (Unsigned)
 Follow Up Note  RE: Mary Garcia MRN: [id number] DOB: October 08, 1945 Date of Office Visit: 01/10/2024  Referring provider: Sylvan Evener, MD Primary care provider: Sylvan Evener, MD  Chief Complaint: No chief complaint on file.  History of Present Illness: I had the pleasure of seeing Mary Garcia for a follow up visit at the [facility] on 01/09/2024. She is a 78 y.o. female, who is being followed for asthma and allergic rhinitis. Her previous allergy  office visit was on 06/14/2023 with Dr. Burdette Carolin. Today is a regular follow up visit.  Discussed the use of AI scribe software for clinical note transcription with the patient, who gave verbal consent to proceed.  History of Present Illness            ***Which inhaler due to insurance-covering? Assessment and Plan: Mary Garcia is a 78 y.o. female with: Mild persistent asthma without complication Past history - coughing, wheezing and SOB for 3-4 years with worsening during URIs. Used to be on Flovent , Qvar . Tried PPIs with no benefit. 2020 skin testing borderline positive to maple tree pollen. 2020 spirometry mild possible restrictive disease with 14% improvement in FEV post bronchodilator treatment.  Interim history - well controlled. Today's spirometry was normal albeit not as good as previous ones.  Daily controller medication(s): Symbicort  80mcg 2 puffs once a day with spacer and rinse mouth afterwards. 1 puff once a day during the warmer months. 2 puffs once a day during the colder months.  Let me know if not covered or too expensive.  During respiratory infections/flares:  Start Symbicort  80mcg 2 puffs twice a day for 1-2 weeks until your breathing symptoms return to baseline.  May use albuterol  rescue inhaler 2 puffs every 4 to 6 hours as needed for shortness of breath, chest tightness, coughing, and wheezing.  Monitor frequency of use - if you need to use it more than twice per week on a consistent basis let us  know.     Seasonal allergic rhinitis due to pollen Past history - Perennial rhinitis symptoms. 2020 skin testing borderline positive to tree pollen.  Interim history - stable.  Continue environmental control measures.  May use azelastine  nasal spray 1-2 sprays per nostril 1-2 times a day as needed for runny nose. May use over the counter antihistamines such as Zyrtec (cetirizine), Claritin (loratadine), Allegra (fexofenadine), or Xyzal (levocetirizine) daily as needed. May take twice a day during flares.    Recommend not using benadryl  every night as a sleeping aid. Follow up with PCP regarding your sleeping issues. Assessment and Plan              No follow-ups on file.  No orders of the defined types were placed in this encounter.  Lab Orders  No laboratory test(s) ordered today    Diagnostics: Spirometry:  Tracings reviewed. Her effort: {Blank single:19197::"Good reproducible efforts.","It was hard to get consistent efforts and there is a question as to whether this reflects a maximal maneuver.","Poor effort, data can not be interpreted."} FVC: ***L FEV1: ***L, ***% predicted FEV1/FVC ratio: ***% Interpretation: {Blank single:19197::"Spirometry consistent with mild obstructive disease","Spirometry consistent with moderate obstructive disease","Spirometry consistent with severe obstructive disease","Spirometry consistent with possible restrictive disease","Spirometry consistent with mixed obstructive and restrictive disease","Spirometry uninterpretable due to technique","Spirometry consistent with normal pattern","No overt abnormalities noted given today's efforts"}.  Please see scanned spirometry results for details.  Skin Testing: {Blank single:19197::"Select foods","Environmental allergy  panel","Environmental allergy  panel and select foods","Food allergy  panel","None","Deferred due to recent antihistamines  use"}. *** Results discussed with patient/family.   Medication List:   Current Outpatient Medications  Medication Sig Dispense Refill   albuterol  (VENTOLIN  HFA) 108 (90 Base) MCG/ACT inhaler Inhale 2 puffs into the lungs every 4 (four) hours as needed for wheezing or shortness of breath (coughing fits). 18 g 1   ALPRAZolam  (XANAX ) 0.5 MG tablet One half to one tab twice daily as needed for anxiety. 60 tablet 3   azelastine  (ASTELIN ) 0.1 % nasal spray Place 1-2 sprays in each nostril one to two times a day as needed for runny nose/drainage down throat 30 mL 5   BREYNA  80-4.5 MCG/ACT inhaler INHALE 2 PUFFS INTO LUNGS TWICE DAILY RINSE MOUTH AFTER USE 11 g 0   Cholecalciferol (VITAMIN D ) 50 MCG (2000 UT) tablet Take 2,000 Units by mouth 2 (two) times daily.     ELDERBERRY PO Take 575 mg by mouth 2 (two) times daily.     hydrocortisone  2.5 % cream Apply topically 2 (two) times daily. 30 g 0   ibandronate  (BONIVA ) 150 MG tablet Take 1 tablet (150 mg total) by mouth every 30 (thirty) days. Take in the morning with a full glass of water, on an empty stomach, and do not take anything else by mouth or lie down for the next 30 min. 3 tablet 3   ketoconazole (NIZORAL) 2 % cream Apply 1 application topically 2 (two) times daily as needed (rash).     Multiple Minerals-Vitamins (CITRACAL MAXIMUM PLUS) TABS Take 1 tablet by mouth daily.     tretinoin (RETIN-A) 0.1 % cream Apply 1 application topically at bedtime.     zolpidem  (AMBIEN ) 10 MG tablet TAKE 1 TABLET BY MOUTH EVERY DAY AT BEDTIME AS NEEDED FOR SLEEP 90 tablet 1   No current facility-administered medications for this visit.   Allergies: No Known Allergies I reviewed her past medical history, social history, family history, and environmental history and no significant changes have been reported from her previous visit.  Review of Systems  Constitutional:  Negative for appetite change, chills, fever and unexpected weight change.  HENT:  Negative for congestion, postnasal drip and rhinorrhea.   Eyes:  Negative for  itching.  Respiratory:  Negative for cough, chest tightness, shortness of breath and wheezing.   Cardiovascular:  Negative for chest pain.  Gastrointestinal:  Negative for abdominal pain.  Genitourinary:  Negative for difficulty urinating.  Skin:  Negative for rash.  Allergic/Immunologic: Positive for environmental allergies. Negative for food allergies.  Neurological:  Negative for headaches.    Objective: There were no vitals taken for this visit. There is no height or weight on file to calculate BMI. Physical Exam Vitals and nursing note reviewed.  Constitutional:      Appearance: Normal appearance. She is well-developed.  HENT:     Head: Normocephalic and atraumatic.     Right Ear: Tympanic membrane and external ear normal.     Left Ear: Tympanic membrane and external ear normal.     Nose: Nose normal.     Mouth/Throat:     Mouth: Mucous membranes are moist.     Pharynx: Oropharynx is clear.  Eyes:     Conjunctiva/sclera: Conjunctivae normal.  Cardiovascular:     Rate and Rhythm: Normal rate and regular rhythm.     Heart sounds: Normal heart sounds. No murmur heard.    No friction rub. No gallop.  Pulmonary:     Effort: Pulmonary effort is normal.     Breath sounds: Normal breath  sounds. No wheezing or rales.  Musculoskeletal:     Cervical back: Neck supple.  Skin:    General: Skin is warm.     Findings: No rash.  Neurological:     Mental Status: She is alert and oriented to person, place, and time.  Psychiatric:        Mood and Affect: Mood normal.        Behavior: Behavior normal.    Previous notes and tests were reviewed. The plan was reviewed with the patient/family, and all questions/concerned were addressed.  It was my pleasure to see Mary Garcia today and participate in her care. Please feel free to contact me with any questions or concerns.  Sincerely,  Eudelia Hero, DO Allergy  & Immunology  [facility]  [place] office:  [phone] [facility] office: [phone]

## 2024-01-10 ENCOUNTER — Encounter: Payer: Self-pay | Admitting: Allergy

## 2024-01-10 ENCOUNTER — Observation Stay (HOSPITAL_COMMUNITY)

## 2024-01-10 ENCOUNTER — Emergency Department (HOSPITAL_COMMUNITY)

## 2024-01-10 ENCOUNTER — Ambulatory Visit: Admitting: Allergy

## 2024-01-10 ENCOUNTER — Encounter (HOSPITAL_COMMUNITY): Payer: Self-pay

## 2024-01-10 ENCOUNTER — Other Ambulatory Visit: Payer: Self-pay

## 2024-01-10 ENCOUNTER — Observation Stay (HOSPITAL_COMMUNITY)
Admission: EM | Admit: 2024-01-10 | Discharge: 2024-01-11 | Disposition: A | Discharge status: Home / Self Care | Attending: Internal Medicine | Admitting: Internal Medicine

## 2024-01-10 VITALS — BP 180/100 | HR 96 | Temp 97.7°F | Resp 20

## 2024-01-10 DIAGNOSIS — R410 Disorientation, unspecified: Secondary | ICD-10-CM | POA: Diagnosis not present

## 2024-01-10 DIAGNOSIS — R42 Dizziness and giddiness: Secondary | ICD-10-CM

## 2024-01-10 DIAGNOSIS — G459 Transient cerebral ischemic attack, unspecified: Secondary | ICD-10-CM | POA: Diagnosis not present

## 2024-01-10 DIAGNOSIS — R29818 Other symptoms and signs involving the nervous system: Secondary | ICD-10-CM | POA: Diagnosis not present

## 2024-01-10 DIAGNOSIS — I639 Cerebral infarction, unspecified: Secondary | ICD-10-CM

## 2024-01-10 DIAGNOSIS — I6782 Cerebral ischemia: Secondary | ICD-10-CM | POA: Diagnosis not present

## 2024-01-10 DIAGNOSIS — I6522 Occlusion and stenosis of left carotid artery: Secondary | ICD-10-CM | POA: Diagnosis not present

## 2024-01-10 DIAGNOSIS — J301 Allergic rhinitis due to pollen: Secondary | ICD-10-CM | POA: Diagnosis not present

## 2024-01-10 DIAGNOSIS — J4530 Mild persistent asthma, uncomplicated: Secondary | ICD-10-CM | POA: Diagnosis not present

## 2024-01-10 DIAGNOSIS — I672 Cerebral atherosclerosis: Secondary | ICD-10-CM | POA: Diagnosis not present

## 2024-01-10 DIAGNOSIS — Z79899 Other long term (current) drug therapy: Secondary | ICD-10-CM | POA: Diagnosis not present

## 2024-01-10 DIAGNOSIS — E785 Hyperlipidemia, unspecified: Secondary | ICD-10-CM | POA: Insufficient documentation

## 2024-01-10 DIAGNOSIS — R030 Elevated blood-pressure reading, without diagnosis of hypertension: Secondary | ICD-10-CM | POA: Diagnosis not present

## 2024-01-10 DIAGNOSIS — R202 Paresthesia of skin: Secondary | ICD-10-CM | POA: Diagnosis not present

## 2024-01-10 DIAGNOSIS — I10 Essential (primary) hypertension: Secondary | ICD-10-CM | POA: Diagnosis not present

## 2024-01-10 DIAGNOSIS — I771 Stricture of artery: Secondary | ICD-10-CM | POA: Diagnosis not present

## 2024-01-10 DIAGNOSIS — F419 Anxiety disorder, unspecified: Secondary | ICD-10-CM

## 2024-01-10 DIAGNOSIS — R200 Anesthesia of skin: Secondary | ICD-10-CM | POA: Diagnosis not present

## 2024-01-10 LAB — COMPREHENSIVE METABOLIC PANEL WITH GFR
ALT: 18 U/L (ref 0–44)
AST: 30 U/L (ref 15–41)
Albumin: 3.9 g/dL (ref 3.5–5.0)
Alkaline Phosphatase: 57 U/L (ref 38–126)
Anion gap: 9 (ref 5–15)
BUN: 15 mg/dL (ref 8–23)
CO2: 25 mmol/L (ref 22–32)
Calcium: 9.6 mg/dL (ref 8.9–10.3)
Chloride: 105 mmol/L (ref 98–111)
Creatinine, Ser: 0.73 mg/dL (ref 0.44–1.00)
GFR, Estimated: >60 mL/min (ref >60)
Glucose, Bld: 110 mg/dL — ABNORMAL HIGH (ref 70–99)
Potassium: 3.9 mmol/L (ref 3.5–5.1)
Sodium: 139 mmol/L (ref 135–145)
Total Bilirubin: 0.3 mg/dL (ref 0.0–1.2)
Total Protein: 6.7 g/dL (ref 6.5–8.1)

## 2024-01-10 LAB — DIFFERENTIAL
Abs Immature Granulocytes: 0.02 10*3/uL (ref 0.00–0.07)
Basophils Absolute: 0 10*3/uL (ref 0.0–0.1)
Basophils Relative: 0 %
Eosinophils Absolute: 0.1 10*3/uL (ref 0.0–0.5)
Eosinophils Relative: 1 %
Immature Granulocytes: 0 %
Lymphocytes Relative: 26 %
Lymphs Abs: 1.6 10*3/uL (ref 0.7–4.0)
Monocytes Absolute: 0.5 10*3/uL (ref 0.1–1.0)
Monocytes Relative: 8 %
Neutro Abs: 3.9 10*3/uL (ref 1.7–7.7)
Neutrophils Relative %: 65 %

## 2024-01-10 LAB — ECHOCARDIOGRAM COMPLETE
AR max vel: 1.79 cm2
AV Area VTI: 1.85 cm2
AV Area mean vel: 1.8 cm2
AV Mean grad: 4 mmHg
AV Peak grad: 11.7 mmHg
Ao pk vel: 1.71 m/s
Area-P 1/2: 2.39 cm2
Calc EF: 66.9 %
S' Lateral: 2.2 cm
Single Plane A2C EF: 66.5 %
Single Plane A4C EF: 69 %
Weight: 2328.06 [oz_av]

## 2024-01-10 LAB — CBC
HCT: 42.7 % (ref 36.0–46.0)
Hemoglobin: 14.3 g/dL (ref 12.0–15.0)
MCH: 29.1 pg (ref 26.0–34.0)
MCHC: 33.5 g/dL (ref 30.0–36.0)
MCV: 87 fL (ref 80.0–100.0)
Platelets: 290 10*3/uL (ref 150–400)
RBC: 4.91 MIL/uL (ref 3.87–5.11)
RDW: 14.1 % (ref 11.5–15.5)
WBC: 6.1 10*3/uL (ref 4.0–10.5)
nRBC: 0 % (ref 0.0–0.2)

## 2024-01-10 LAB — URINALYSIS, W/ REFLEX TO CULTURE (INFECTION SUSPECTED)
Bacteria, UA: NONE SEEN
Bilirubin Urine: NEGATIVE
Glucose, UA: NEGATIVE mg/dL
Hgb urine dipstick: NEGATIVE
Ketones, ur: NEGATIVE mg/dL
Leukocytes,Ua: NEGATIVE
Nitrite: NEGATIVE
Protein, ur: NEGATIVE mg/dL
Specific Gravity, Urine: 1.01 (ref 1.005–1.030)
pH: 8 (ref 5.0–8.0)

## 2024-01-10 LAB — PROTIME-INR
INR: 0.9 (ref 0.8–1.2)
Prothrombin Time: 11.8 s (ref 11.4–15.2)

## 2024-01-10 LAB — I-STAT CHEM 8, ED
BUN: 16 mg/dL (ref 8–23)
Calcium, Ion: 1.2 mmol/L (ref 1.15–1.40)
Chloride: 104 mmol/L (ref 98–111)
Creatinine, Ser: 0.8 mg/dL (ref 0.44–1.00)
Glucose, Bld: 106 mg/dL — ABNORMAL HIGH (ref 70–99)
HCT: 43 % (ref 36.0–46.0)
Hemoglobin: 14.6 g/dL (ref 12.0–15.0)
Potassium: 3.7 mmol/L (ref 3.5–5.1)
Sodium: 139 mmol/L (ref 135–145)
TCO2: 24 mmol/L (ref 22–32)

## 2024-01-10 LAB — APTT: aPTT: 25 s (ref 24–36)

## 2024-01-10 LAB — CBG MONITORING, ED: Glucose-Capillary: 103 mg/dL — ABNORMAL HIGH (ref 70–99)

## 2024-01-10 LAB — ETHANOL: Alcohol, Ethyl (B): <15 mg/dL (ref <15)

## 2024-01-10 MED ORDER — ACETAMINOPHEN 325 MG PO TABS
650.0000 mg | ORAL_TABLET | ORAL | Status: DC | PRN
  Administered 2024-01-11: 650 mg via ORAL
  Filled 2024-01-10: qty 2

## 2024-01-10 MED ORDER — LABETALOL HCL 5 MG/ML IV SOLN: 20.0000 mg | INTRAVENOUS | Status: DC | PRN

## 2024-01-10 MED ORDER — ZOLPIDEM TARTRATE 5 MG PO TABS
10.0000 mg | ORAL_TABLET | Freq: Every evening | ORAL | Status: DC | PRN
  Administered 2024-01-10: 10 mg via ORAL
  Filled 2024-01-10: qty 2

## 2024-01-10 MED ORDER — CLOPIDOGREL BISULFATE 300 MG PO TABS
300.0000 mg | ORAL_TABLET | Freq: Once | ORAL | Status: AC
  Administered 2024-01-10: 300 mg via ORAL
  Filled 2024-01-10: qty 1

## 2024-01-10 MED ORDER — SODIUM CHLORIDE 0.9 % IV SOLN: INTRAVENOUS | Status: DC

## 2024-01-10 MED ORDER — SODIUM CHLORIDE 0.9% FLUSH: 3.0000 mL | Freq: Once | INTRAVENOUS | Status: DC

## 2024-01-10 MED ORDER — ENOXAPARIN SODIUM 40 MG/0.4ML IJ SOSY
40.0000 mg | PREFILLED_SYRINGE | INTRAMUSCULAR | Status: DC
  Administered 2024-01-11: 40 mg via SUBCUTANEOUS
  Filled 2024-01-10: qty 0.4

## 2024-01-10 MED ORDER — ACETAMINOPHEN 160 MG/5ML PO SOLN: 650.0000 mg | ORAL | Status: DC | PRN

## 2024-01-10 MED ORDER — ASPIRIN 81 MG PO TBEC
81.0000 mg | DELAYED_RELEASE_TABLET | Freq: Every day | ORAL | Status: DC
  Administered 2024-01-10 – 2024-01-11 (×2): 81 mg via ORAL
  Filled 2024-01-10 (×2): qty 1

## 2024-01-10 MED ORDER — STROKE: EARLY STAGES OF RECOVERY BOOK
Freq: Once | Status: AC
  Filled 2024-01-10: qty 1

## 2024-01-10 MED ORDER — ACETAMINOPHEN 650 MG RE SUPP: 650.0000 mg | RECTAL | Status: DC | PRN

## 2024-01-10 MED ORDER — IOHEXOL 350 MG/ML SOLN
75.0000 mL | Freq: Once | INTRAVENOUS | Status: AC | PRN
  Administered 2024-01-10: 75 mL via INTRAVENOUS

## 2024-01-10 MED ORDER — ALPRAZOLAM 0.25 MG PO TABS: 0.5000 mg | ORAL_TABLET | Freq: Two times a day (BID) | ORAL | Status: DC | PRN

## 2024-01-10 NOTE — H&P (Signed)
 History and Physical    Mary Garcia ZOX:[id number] DOB: 1946/06/27 DOA: 01/10/2024  PCP: Sylvan Evener, MD  Patient coming from: home   Chief Complaint: confusion  HPI: Mary Garcia is a 78 y.o. female with medical history significant for osteoporosis presents with the above.  Was in her usual state of health, went to see her allergist for a regularly scheduled f/u appointment when developed acute onset of confusion, trouble with directions, also light headed. Lasted a few hours and has slowly dissipated. Also some left arm/hand tingling that has resolved. No difficulty swallowing. Fell 4 nights ago and hit head, otherwise no change in health, no recent med changes. No history stroke. Nothing like this has happened before.   Review of Systems: As per HPI otherwise 10 point review of systems negative.    Past Medical History:  Diagnosis Date   Anxiety    Arthritis    Asthma    Bronchitis, acute     Past Surgical History:  Procedure Laterality Date   BREAST ENHANCEMENT SURGERY     CATARACT EXTRACTION W/ INTRAOCULAR LENS IMPLANT Bilateral    FACELIFT     local   ROTATOR CUFF REPAIR Right    TIBIA FRACTURE SURGERY  2004   left leg x3, tibia and fibula   TOTAL KNEE ARTHROPLASTY Left 10/18/2021   Procedure: TOTAL KNEE ARTHROPLASTY;  Surgeon: Christie Cox, MD;  Location: [facility];  Service: Orthopedics;  Laterality: Left;   TUMOR EXCISION  2003   right muscle back, benign     reports that she quit smoking about 45 years ago. Her smoking use included cigarettes. She started smoking about 60 years ago. She has a 6 pack-year smoking history. She has never used smokeless tobacco. She reports current alcohol use of about 2.0 standard drinks of alcohol per week. She reports that she does not use drugs.  No Known Allergies  Family History  Problem Relation Age of Onset   Asthma Mother    Brain cancer Father    Colon cancer Neg Hx     Prior to Admission medications   Medication Sig  Start Date End Date Taking? Authorizing Provider  albuterol  (VENTOLIN  HFA) 108 (90 Base) MCG/ACT inhaler Inhale 2 puffs into the lungs every 4 (four) hours as needed for wheezing or shortness of breath (coughing fits). 06/08/22   Trudy Fusi, DO  ALPRAZolam  (XANAX ) 0.5 MG tablet One half to one tab twice daily as needed for anxiety. 11/22/22   Sylvan Evener, MD  azelastine  (ASTELIN ) 0.1 % nasal spray Place 1-2 sprays in each nostril one to two times a day as needed for runny nose/drainage down throat 09/13/21   Tinnie Forehand, FNP  BREYNA  80-4.5 MCG/ACT inhaler INHALE 2 PUFFS INTO LUNGS TWICE DAILY RINSE MOUTH AFTER USE Patient taking differently: Inhale 2 puffs into the lungs in the morning and at bedtime. 12/28/23   Trudy Fusi, DO  Cholecalciferol (VITAMIN D ) 50 MCG (2000 UT) tablet Take 2,000 Units by mouth 2 (two) times daily.    [provider]  ELDERBERRY PO Take 575 mg by mouth 2 (two) times daily.    [provider]  hydrocortisone  2.5 % cream Apply topically 2 (two) times daily. 11/23/23   Sylvan Evener, MD  ibandronate  (BONIVA ) 150 MG tablet Take 1 tablet (150 mg total) by mouth every 30 (thirty) days. Take in the morning with a full glass of water, on an empty stomach, and do not take anything  else by mouth or lie down for the next 30 min. 06/02/23   Sylvan Evener, MD  ketoconazole (NIZORAL) 2 % cream Apply 1 application topically 2 (two) times daily as needed (rash).    [provider]  Multiple Minerals-Vitamins (CITRACAL MAXIMUM PLUS) TABS Take 1 tablet by mouth daily.    [provider]  tretinoin (RETIN-A) 0.1 % cream Apply 1 application topically at bedtime.    [provider]  zolpidem  (AMBIEN ) 10 MG tablet TAKE 1 TABLET BY MOUTH EVERY DAY AT BEDTIME AS NEEDED FOR SLEEP Patient taking differently: Take 10 mg by mouth at bedtime as needed for sleep. TAKE 1 TABLET BY MOUTH EVERY DAY AT BEDTIME AS NEEDED FOR SLEEP 12/21/23   Sylvan Evener, MD     Physical Exam: Vitals:   01/10/24 1200 01/10/24 1230  BP:  (!) 190/104  Pulse:  69  Resp:  15  Temp:  97.8 F (36.6 C)  TempSrc:  Oral  SpO2:  100%  Weight: 66 kg     Constitutional: No acute distress Head: Atraumatic Eyes: Conjunctiva clear ENM: Moist mucous membranes. Normal dentition.  Neck: Supple Respiratory: Clear to auscultation bilaterally, no wheezing/rales/rhonchi.   Cardiovascular: Regular rate and rhythm. No murmurs/rubs/gallops. Abdomen: Non-tender, non-distended.   Musculoskeletal: No joint deformity upper and lower extremities. Normal ROM, no contractures. Normal muscle tone.  Skin: No rashes, lesions, or ulcers.  Extremities: No peripheral edema. Palpable peripheral pulses. Neurologic: Alert, moving all 4 extremities. 5/5 upper strength and lower, distal sensation in tact, cn 2-12 grossly intact Psychiatric: Normal insight and judgement.   Labs on Admission: I have personally reviewed following labs and imaging studies  CBC: Recent Labs  Lab 01/10/24 1226 01/10/24 1231  WBC 6.1  --   NEUTROABS 3.9  --   HGB 14.3 14.6  HCT 42.7 43.0  MCV 87.0  --   PLT 290  --    Basic Metabolic Panel: Recent Labs  Lab 01/10/24 1226 01/10/24 1231  NA 139 139  K 3.9 3.7  CL 105 104  CO2 25  --   GLUCOSE 110* 106*  BUN 15 16  CREATININE 0.73 0.80  CALCIUM  9.6  --    GFR: Estimated Creatinine Clearance: 55.4 mL/min (by C-G formula based on SCr of 0.8 mg/dL). Liver Function Tests: Recent Labs  Lab 01/10/24 1226  AST 30  ALT 18  ALKPHOS 57  BILITOT 0.3  PROT 6.7  ALBUMIN 3.9   No results for input(s): "LIPASE", "AMYLASE" in the last 168 hours. No results for input(s): "AMMONIA" in the last 168 hours. Coagulation Profile: Recent Labs  Lab 01/10/24 1226  INR 0.9   Cardiac Enzymes: No results for input(s): "CKTOTAL", "CKMB", "CKMBINDEX", "TROPONINI" in the last 168 hours. BNP (last 3 results) No results for input(s): "PROBNP" in the last  8760 hours. HbA1C: No results for input(s): "HGBA1C" in the last 72 hours. CBG: Recent Labs  Lab 01/10/24 1225  GLUCAP 103*   Lipid Profile: No results for input(s): "CHOL", "HDL", "LDLCALC", "TRIG", "CHOLHDL", "LDLDIRECT" in the last 72 hours. Thyroid  Function Tests: No results for input(s): "TSH", "T4TOTAL", "FREET4", "T3FREE", "THYROIDAB" in the last 72 hours. Anemia Panel: No results for input(s): "VITAMINB12", "FOLATE", "FERRITIN", "TIBC", "IRON", "RETICCTPCT" in the last 72 hours. Urine analysis:    Component Value Date/Time   COLORURINE COLORLESS (A) 01/10/2024 1323   APPEARANCEUR CLEAR 01/10/2024 1323   LABSPEC 1.010 01/10/2024 1323   PHURINE 8.0 01/10/2024 1323   GLUCOSEU NEGATIVE 01/10/2024  1323   HGBUR NEGATIVE 01/10/2024 1323   BILIRUBINUR NEGATIVE 01/10/2024 1323   BILIRUBINUR negative 11/23/2023 1141   BILIRUBINUR neg 12/06/2022 1025   KETONESUR NEGATIVE 01/10/2024 1323   PROTEINUR NEGATIVE 01/10/2024 1323   UROBILINOGEN 0.2 11/23/2023 1141   NITRITE NEGATIVE 01/10/2024 1323   LEUKOCYTESUR NEGATIVE 01/10/2024 1323    Radiological Exams on Admission: CT ANGIO HEAD NECK W WO CM (CODE STROKE) Result Date: 01/10/2024 CLINICAL DATA:  Code stroke, neuro deficit. EXAM: CT ANGIOGRAPHY HEAD AND NECK WITH AND WITHOUT CONTRAST TECHNIQUE: Multidetector CT imaging of the head and neck was performed using the standard protocol during bolus administration of intravenous contrast. Multiplanar CT image reconstructions and MIPs were obtained to evaluate the vascular anatomy. Carotid stenosis measurements (when applicable) are obtained utilizing NASCET criteria, using the distal internal carotid diameter as the denominator. RADIATION DOSE REDUCTION: This exam was performed according to the departmental dose-optimization program which includes automated exposure control, adjustment of the mA and/or kV according to patient size and/or use of iterative reconstruction technique. CONTRAST:   75mL OMNIPAQUE  IOHEXOL  350 MG/ML SOLN COMPARISON:  Same-day head CT. FINDINGS: CTA NECK FINDINGS Aortic arch: Standard configuration of the aortic arch. Imaged portion shows no evidence of aneurysm or dissection. No significant stenosis of the major arch vessel origins. Pulmonary arteries: As permitted by contrast timing, there are no filling defects in the visualized pulmonary arteries. Subclavian arteries: The subclavian arteries are patent bilaterally. Right carotid system: Patent from the origin to the skull base. Mild tortuosity of the proximal common carotid artery. Carotid bifurcation widely patent without significant atherosclerosis or high-grade stenosis. Left carotid system: Patent from the origin to the skull base. Mild atherosclerosis at the carotid bifurcation. No high-grade stenosis. Vertebral arteries: Codominant. No evidence of dissection, stenosis (50% or greater), or occlusion. Skeleton: No acute findings. Degenerative changes in the cervical spine. Disc space narrowing greatest at C4-5 and C5-6. Other neck: The visualized airway is patent. No cervical lymphadenopathy. Upper chest: Visualized lung apices are clear. Review of the MIP images confirms the above findings CTA HEAD FINDINGS ANTERIOR CIRCULATION: The intracranial ICAs are patent bilaterally. Minimal atherosclerosis of the carotid siphons. No significant stenosis, proximal occlusion, aneurysm, or vascular malformation. MCAs: The middle cerebral arteries are patent bilaterally. ACAs: The anterior cerebral arteries are patent bilaterally. POSTERIOR CIRCULATION: No significant stenosis, proximal occlusion, aneurysm, or vascular malformation. PCAs: The posterior cerebral arteries are patent bilaterally. Pcomm: Visualized bilaterally, more pronounced on the left. SCAs: The superior cerebellar arteries are patent bilaterally. Basilar artery: Patent AICAs: Visualized on the right. PICAs: Visualized on the left. Vertebral arteries: The  intracranial vertebral arteries are patent. Venous sinuses: As permitted by contrast timing, patent. Anatomic variants: None Review of the MIP images confirms the above findings IMPRESSION: No large vessel occlusion. No high-grade stenosis, aneurysm, or dissection of the arteries in the head and neck. Mild atherosclerosis as above. Electronically Signed   By: Denny Flack M.D.   On: 01/10/2024 13:07   CT HEAD CODE STROKE WO CONTRAST Result Date: 01/10/2024 CLINICAL DATA:  Code stroke.  Neuro deficit, concern for stroke. EXAM: CT HEAD WITHOUT CONTRAST TECHNIQUE: Contiguous axial images were obtained from the base of the skull through the vertex without intravenous contrast. RADIATION DOSE REDUCTION: This exam was performed according to the departmental dose-optimization program which includes automated exposure control, adjustment of the mA and/or kV according to patient size and/or use of iterative reconstruction technique. COMPARISON:  None Available. FINDINGS: Brain: No acute intracranial hemorrhage. No CT  evidence of acute infarct. No edema, mass effect, or midline shift. The basilar cisterns are patent. Ventricles: The ventricles are normal. Vascular: No hyperdense vessel or unexpected calcification. Skull: No acute or aggressive finding. Orbits: Orbits are symmetric. Sinuses: Mild mucosal thickening in the ethmoid sinuses. Other: Mastoid air cells are clear. ASPECTS [facility] Stroke Program Early CT Score) - Ganglionic level infarction (caudate, lentiform nuclei, internal capsule, insula, M1-M3 cortex): 7 - Supraganglionic infarction (M4-M6 cortex): 3 Total score (0-10 with 10 being normal): 10 IMPRESSION: 1. No CT evidence of acute intracranial abnormality. 2. ASPECTS is 10 These results were communicated to Dr. Alecia Ames at 12:46 pm on 01/10/2024 by text page via the [facility] messaging system. Electronically Signed   By: Denny Flack M.D.   On: 01/10/2024 12:47    EKG: Independently reviewed.  nsr  Assessment/Plan Principal Problem:   TIA (transient ischemic attack)   # Transient confusion # Left arm paresthesia Concerning for TIA vs CVA. Neuro consulted code stroke, tpa not given. CT head and CTA head/neck negative. Unremarkable labs including glucose - f/u neuro recs - MRI pending - lipids/A1c - tele  # Elevated blood pressure Denies history of htn. Would be consistent with CVA/TIA - permissive htn with prn labetalol   # GAD - home xanax  prn  DVT prophylaxis: lovenox  Code Status: full  Family Communication: husband updated at bedside  Consults called: neurology   Level of care: Telemetry Medical Status is: Observation The patient remains OBS appropriate and will d/c before 2 midnights.    Raymonde Calico MD [facility] Pager [phone]  If 7PM-7AM, please contact night-coverage [email or web address] Password [email or web address]  01/10/2024, 2:50 PM

## 2024-01-10 NOTE — Progress Notes (Signed)
 Pt pupils are unequal in size right 3mm left 2mm. According to pt, EMS has noticed the same thing. Opyd, MD aware.

## 2024-01-10 NOTE — ED Triage Notes (Signed)
 Patient bib EMS after having dizziness and confusion while driving down the road. Patient had just left a yoga class and was headed to her allergy  appointment when she was suddenly dizzy and didn't know where she was. She also reported left hand numbness.   Patient was hypertensive enroute. They also reported left eye was 2mm and non reactive right eye was 3 and reactive.   Patient alert and oriented x4 on arrival to ED.

## 2024-01-10 NOTE — ED Notes (Signed)
 Patient transported to MRI

## 2024-01-10 NOTE — Consult Note (Signed)
 NEUROLOGY CONSULT NOTE   Date of service: Jan 10, 2024 Patient Name: Mary Garcia MRN:  [id number] DOB:  Oct 13, 1945 Chief Complaint: "left hand numbness" Requesting Provider: No att. providers found  History of Present Illness  Mary Garcia is a 78 y.o. female with hx of asthma and eczema who was at her allergist earlier today and then around 1030 she began having dizziness and left hand numbness.  Her initial BP at her allergist was 140/100, and then subsequently was 180/100.  She called EMS and with EMS, her blood pressure was 218 systolic.  Given the acute neurological symptoms a code stroke was activated and she was [place] emerg[place].  She was taken for CT which was negative for any type of intracranial hemorrhage and CTA which was also negative.  She does state that her numbness is improving, though it is still persistent at this time.  LKW: 10:30 AM Modified rankin score: 0-Completely asymptomatic and back to baseline post- stroke IV Thrombolysis: No, mild symptoms EVT: No, mild symptoms   NIHSS components Score: Comment  1a Level of Conscious 0[x]  1[]  2[]  3[]      1b LOC Questions 0[x]  1[]  2[]       1c LOC Commands 0[x]  1[]  2[]       2 Best Gaze 0[x]  1[]  2[]       3 Visual 0[x]  1[]  2[]  3[]      4 Facial Palsy 0[x]  1[]  2[]  3[]      5a Motor Arm - left 0[x]  1[]  2[]  3[]  4[]  UN[]    5b Motor Arm - Right 0[x]  1[]  2[]  3[]  4[]  UN[]    6a Motor Leg - Left 0[x]  1[]  2[]  3[]  4[]  UN[]    6b Motor Leg - Right 0[x]  1[]  2[]  3[]  4[]  UN[]    7 Limb Ataxia 0[x]  1[]  2[]  UN[]      8 Sensory 0[]  1[x]  2[]  UN[]      9 Best Language 0[x]  1[]  2[]  3[]      10 Dysarthria 0[x]  1[]  2[]  UN[]      11 Extinct. and Inattention 0[x]  1[]  2[]       TOTAL: 1       Past History   Past Medical History:  Diagnosis Date   Anxiety    Arthritis    Asthma    Bronchitis, acute     Past Surgical History:  Procedure Laterality Date   BREAST ENHANCEMENT SURGERY     CATARACT EXTRACTION W/ INTRAOCULAR  LENS IMPLANT Bilateral    FACELIFT     local   ROTATOR CUFF REPAIR Right    TIBIA FRACTURE SURGERY  2004   left leg x3, tibia and fibula   TOTAL KNEE ARTHROPLASTY Left 10/18/2021   Procedure: TOTAL KNEE ARTHROPLASTY;  Surgeon: Christie Cox, MD;  Location: [facility];  Service: Orthopedics;  Laterality: Left;   TUMOR EXCISION  2003   right muscle back, benign    Family History: Family History  Problem Relation Age of Onset   Asthma Mother    Brain cancer Father    Colon cancer Neg Hx     Social History  reports that she quit smoking about 45 years ago. Her smoking use included cigarettes. She started smoking about 60 years ago. She has a 6 pack-year smoking history. She has never used smokeless tobacco. She reports current alcohol use of about 2.0 standard drinks of alcohol per week. She reports that she does not use drugs.  No Known Allergies  Medications   Current Facility-Administered Medications:  sodium chloride  flush (NS) 0.9 % injection 3 mL, 3 mL, Intravenous, Once, Long, Joshua G, MD  Current Outpatient Medications:    albuterol  (VENTOLIN  HFA) 108 (90 Base) MCG/ACT inhaler, Inhale 2 puffs into the lungs every 4 (four) hours as needed for wheezing or shortness of breath (coughing fits)., Disp: 18 g, Rfl: 1   ALPRAZolam  (XANAX ) 0.5 MG tablet, One half to one tab twice daily as needed for anxiety., Disp: 60 tablet, Rfl: 3   azelastine  (ASTELIN ) 0.1 % nasal spray, Place 1-2 sprays in each nostril one to two times a day as needed for runny nose/drainage down throat, Disp: 30 mL, Rfl: 5   BREYNA  80-4.5 MCG/ACT inhaler, INHALE 2 PUFFS INTO LUNGS TWICE DAILY RINSE MOUTH AFTER USE, Disp: 11 g, Rfl: 0   Cholecalciferol (VITAMIN D ) 50 MCG (2000 UT) tablet, Take 2,000 Units by mouth 2 (two) times daily., Disp: , Rfl:    ELDERBERRY PO, Take 575 mg by mouth 2 (two) times daily., Disp: , Rfl:    hydrocortisone  2.5 % cream, Apply topically 2 (two) times daily., Disp: 30 g, Rfl: 0    ibandronate  (BONIVA ) 150 MG tablet, Take 1 tablet (150 mg total) by mouth every 30 (thirty) days. Take in the morning with a full glass of water, on an empty stomach, and do not take anything else by mouth or lie down for the next 30 min., Disp: 3 tablet, Rfl: 3   ketoconazole (NIZORAL) 2 % cream, Apply 1 application topically 2 (two) times daily as needed (rash)., Disp: , Rfl:    Multiple Minerals-Vitamins (CITRACAL MAXIMUM PLUS) TABS, Take 1 tablet by mouth daily., Disp: , Rfl:    tretinoin (RETIN-A) 0.1 % cream, Apply 1 application topically at bedtime., Disp: , Rfl:    zolpidem  (AMBIEN ) 10 MG tablet, TAKE 1 TABLET BY MOUTH EVERY DAY AT BEDTIME AS NEEDED FOR SLEEP, Disp: 90 tablet, Rfl: 1  Vitals   Vitals:   01/10/24 1200  Weight: 66 kg    Body mass index is 25.77 kg/m.  Physical Exam   Constitutional: Appears well-developed and well-nourished.   Neurologic Examination    Neuro: Mental Status: Patient is awake, alert, oriented to person, place, month, year, and situation. Patient is able to give a clear and coherent history. No signs of aphasia or neglect Cranial Nerves: II: Visual Fields are full. Pupils are equal, round, and reactive to light.   III,IV, VI: EOMI without ptosis or diploplia.  V: Facial sensation is symmetric to temperature VII: Facial movement is symmetric.  VIII: hearing is intact to voice X: Uvula elevates symmetrically XII: tongue is midline without atrophy or fasciculations.  Motor: Tone is normal. Bulk is normal. 5/5 strength was present in all four extremities. She has good fine motor movement. Sensory: Sensation is diminished in the left arm.  Cerebellar: FNF and HKS are intact bilaterally     Labs/Imaging/Neurodiagnostic studies   Basic Metabolic Panel:  Lab Results  Component Value Date   NA 141 11/14/2023   K 4.8 11/14/2023   CO2 27 11/14/2023   GLUCOSE 98 11/14/2023   BUN 21 11/14/2023   CREATININE 0.75 11/14/2023   CALCIUM  9.7  11/14/2023   GFRNONAA >60 10/18/2021   GFRAA 88 11/03/2020   Lipid Panel:  Lab Results  Component Value Date   LDLCALC 87 11/14/2023   HgbA1c:  Lab Results  Component Value Date   HGBA1C 5.5 08/24/2021   INR  Lab Results  Component Value Date   INR  0.9 08/24/2021   APTT  Lab Results  Component Value Date   APTT 27 08/24/2021   CT Head without contrast(Personally reviewed): negative  CT angio Head and Neck with contrast(Personally reviewed): negative ASSESSMENT   BEVERLEY ALLENDER is a 78 y.o. female who presents with acute onset dizziness and left arm numbness which is improving.  My suspicion is that this represents mild stroke, though with improving symptoms, possible this TIA.  In any case, her symptoms are currently too mild to merit IV thrombolytics.  I do think I would favor treating this as an acute ischemic event and start aspirin  and Plavix  RECOMMENDATIONS  ASA 81 mg and Plavix 75 mg after 300 mg load Lipids, A1c Telemetry, echo Permissive hypertension to 220/120 If MRI is positive, PT, OT, ST Stroke team to follow ______________________________________________________________________    Signed, Ann Keto, MD [facility]

## 2024-01-10 NOTE — Progress Notes (Signed)
 Pt has arrive to [address] [facility]. Alert and oriented x 4, identified appropriately. VS stable, no signs of acute distress. Cardiac monitor placed and CCMD notified. Pt oriented to room and equipment, instructed to use call bell for assistance. Call bell left within pt reach. Will continue to monitor pt.

## 2024-01-10 NOTE — Patient Instructions (Addendum)
 Mild persistent asthma Daily controller medication(s): Symbicort  80mcg 2 puffs once a day with spacer and rinse mouth afterwards. 1 puff once a day during the warmer months. 2 puffs once a day during the colder months.  Let me know if not covered or too expensive.  During respiratory infections/flares:  Start Symbicort  80mcg 2 puffs twice a day for 1-2 weeks until your breathing symptoms return to baseline.  May use albuterol  rescue inhaler 2 puffs every 4 to 6 hours as needed for shortness of breath, chest tightness, coughing, and wheezing.  Monitor frequency of use - if you need to use it more than twice per week on a consistent basis let us  know.  Breathing control goals:  Full participation in all desired activities (may need albuterol  before activity) Albuterol  use two times or less a week on average (not counting use with activity) Cough interfering with sleep two times or less a month Oral steroids no more than once a year No hospitalizations   Allergic rhinitis 2020 skin testing borderline positive to tree pollen.  Continue environmental control measures.  May use azelastine  nasal spray 1-2 sprays per nostril 1-2 times a day as needed for runny nose. May use over the counter antihistamines such as Zyrtec (cetirizine), Claritin (loratadine), Allegra (fexofenadine), or Xyzal (levocetirizine) daily as needed. May take twice a day during flares.   Follow up with me when you are feeling better.  Reducing Pollen Exposure Pollen seasons: trees (spring), grass (summer) and ragweed/weeds (fall). Keep windows closed in your home and car to lower pollen exposure.  Install air conditioning in the bedroom and throughout the house if possible.  Avoid going out in dry windy days - especially early morning. Pollen counts are highest between 5 - 10 AM and on dry, hot and windy days.  Save outside activities for late afternoon or after a heavy rain, when pollen levels are lower.  Avoid mowing of  grass if you have grass pollen allergy . Be aware that pollen can also be transported indoors on people and pets.  Dry your clothes in an automatic dryer rather than hanging them outside where they might collect pollen.  Rinse hair and eyes before bedtime.

## 2024-01-10 NOTE — Code Documentation (Signed)
 Stroke Response Nurse Documentation Code Documentation  Mary Garcia is a 78 y.o. female arriving to [facility]  via [facility] on 5/28 with past medical hx of asthma and eczema. On No antithrombotic. Code stroke was activated by EMS.   Patient from home where she was LKW at 1030 and now complaining of dizziness and L hand numbness.   Stroke team at the bedside on patient arrival. Labs drawn and patient cleared for CT by EDP. Patient to CT with team. NIHSS 1, see documentation for details and code stroke times. Patient with left decreased sensation on exam. The following imaging was completed:  CT Head and CTA. Patient is not a candidate for IV Thrombolytic due to symptoms mild/improving. Patient is not a candidate for IR due to no LVO.   Care Plan: q30 NIHSS and vitals until 1500 then q2. BP <220/110.   Bedside handoff with ED RN Kennith Peak  Stroke Response RN

## 2024-01-10 NOTE — Progress Notes (Signed)
*  PRELIMINARY RESULTS* Echocardiogram 2D Echocardiogram has been performed.  Mary Garcia 01/10/2024, 3:58 PM

## 2024-01-10 NOTE — ED Provider Notes (Signed)
 [facility] EMERG[facility]Y [facility]PART[facility] AT [facility] Provider Note   CSN: [id number] Arrival date & time: 01/10/24  1224     History  Chief Complaint  Patient presents with   Code Stroke    Mary Garcia is a 78 y.o. female.  HPI   Patient has a history of bronchitis anxiety asthma.  Patient presented to the ED for evaluation of acute dizziness and confusion.  Patient also reported some left hand numbness.  Patient woke up this morning and felt fine.  She had exercised including a yoga class.  She had the abrupt onset of her symptoms this morning while she was driving to an appointment where she became confused did not want to where to go.  EMS was called and patient was activated as a code stroke.  Patient was seen by stroke team on arrival.  Patient had already completed her CT scan and initial stroke evaluation prior to my exam.  Patient states her symptoms are improving  Home Medications Prior to Admission medications   Medication Sig Start Date End Date Taking? Authorizing Provider  albuterol  (VENTOLIN  HFA) 108 (90 Base) MCG/ACT inhaler Inhale 2 puffs into the lungs every 4 (four) hours as needed for wheezing or shortness of breath (coughing fits). 06/08/22   Trudy Fusi, DO  ALPRAZolam  (XANAX ) 0.5 MG tablet One half to one tab twice daily as needed for anxiety. 11/22/22   Sylvan Evener, MD  azelastine  (ASTELIN ) 0.1 % nasal spray Place 1-2 sprays in each nostril one to two times a day as needed for runny nose/drainage down throat 09/13/21   Tinnie Forehand, FNP  BREYNA  80-4.5 MCG/ACT inhaler INHALE 2 PUFFS INTO LUNGS TWICE DAILY RINSE MOUTH AFTER USE 12/28/23   Trudy Fusi, DO  Cholecalciferol (VITAMIN D ) 50 MCG (2000 UT) tablet Take 2,000 Units by mouth 2 (two) times daily.    [provider]  ELDERBERRY PO Take 575 mg by mouth 2 (two) times daily.    [provider]  hydrocortisone  2.5 % cream Apply topically 2 (two) times daily. 11/23/23   Sylvan Evener, MD   ibandronate  (BONIVA ) 150 MG tablet Take 1 tablet (150 mg total) by mouth every 30 (thirty) days. Take in the morning with a full glass of water, on an empty stomach, and do not take anything else by mouth or lie down for the next 30 min. 06/02/23   Sylvan Evener, MD  ketoconazole (NIZORAL) 2 % cream Apply 1 application topically 2 (two) times daily as needed (rash).    [provider]  Multiple Minerals-Vitamins (CITRACAL MAXIMUM PLUS) TABS Take 1 tablet by mouth daily.    [provider]  tretinoin (RETIN-A) 0.1 % cream Apply 1 application topically at bedtime.    [provider]  zolpidem  (AMBIEN ) 10 MG tablet TAKE 1 TABLET BY MOUTH EVERY DAY AT BEDTIME AS NEEDED FOR SLEEP 12/21/23   Baxley, Jaynie Meyers, MD      Allergies    Patient has no known allergies.    Review of Systems   Review of Systems  Physical Exam Updated Vital Signs BP (!) 190/104   Pulse 69   Temp 97.8 F (36.6 C) (Oral)   Resp 15   Wt 66 kg   SpO2 100%   BMI 25.77 kg/m  Physical Exam Vitals and nursing note reviewed.  Constitutional:      Appearance: She is well-developed. She is not diaphoretic.  HENT:     Head:  Normocephalic and atraumatic.     Right Ear: External ear normal.     Left Ear: External ear normal.  Eyes:     General: No scleral icterus.       Right eye: No discharge.        Left eye: No discharge.     Conjunctiva/sclera: Conjunctivae normal.  Neck:     Trachea: No tracheal deviation.  Cardiovascular:     Rate and Rhythm: Normal rate and regular rhythm.  Pulmonary:     Effort: Pulmonary effort is normal. No respiratory distress.     Breath sounds: Normal breath sounds. No stridor. No wheezing or rales.  Abdominal:     General: Bowel sounds are normal. There is no distension.     Palpations: Abdomen is soft.     Tenderness: There is no abdominal tenderness. There is no guarding or rebound.  Musculoskeletal:        General: No tenderness or deformity.     Cervical  back: Neck supple.  Skin:    General: Skin is warm and dry.     Findings: No rash.  Neurological:     General: No focal deficit present.     Mental Status: She is alert.     Cranial Nerves: No cranial nerve deficit, dysarthria or facial asymmetry.     Sensory: No sensory deficit.     Motor: No abnormal muscle tone or seizure activity.     Coordination: Coordination normal.     Comments: Equal grip strength bilaterally, normal sensation, no difficulty lifting her legs off the bed   Psychiatric:        Mood and Affect: Mood normal.     ED Results / Procedures / Treatments   Labs (all labs ordered are listed, but only abnormal results are displayed) Labs Reviewed  I-STAT CHEM 8, ED - Abnormal; Notable for the following components:      Result Value   Glucose, Bld 106 (*)    All other components within normal limits  CBG MONITORING, ED - Abnormal; Notable for the following components:   Glucose-Capillary 103 (*)    All other components within normal limits  PROTIME-INR  APTT  CBC  DIFFERENTIAL  COMPREHENSIVE METABOLIC PANEL WITH GFR  ETHANOL    EKG EKG Interpretation Date/Time:  Wednesday Jan 10 2024 12:58:39 EDT Ventricular Rate:  71 PR Interval:  140 QRS Duration:  93 QT Interval:  398 QTC Calculation: 433 R Axis:   -33  Text Interpretation: Sinus rhythm Left axis deviation Low voltage, precordial leads No significant change since last tracing Confirmed by Trish Furl [phone]) on 01/10/2024 1:01:06 PM  Radiology CT HEAD CODE STROKE WO CONTRAST Result Date: 01/10/2024 CLINICAL DATA:  Code stroke.  Neuro deficit, concern for stroke. EXAM: CT HEAD WITHOUT CONTRAST TECHNIQUE: Contiguous axial images were obtained from the base of the skull through the vertex without intravenous contrast. RADIATION DOSE REDUCTION: This exam was performed according to the departmental dose-optimization program which includes automated exposure control, adjustment of the mA and/or kV according  to patient size and/or use of iterative reconstruction technique. COMPARISON:  None Available. FINDINGS: Brain: No acute intracranial hemorrhage. No CT evidence of acute infarct. No edema, mass effect, or midline shift. The basilar cisterns are patent. Ventricles: The ventricles are normal. Vascular: No hyperdense vessel or unexpected calcification. Skull: No acute or aggressive finding. Orbits: Orbits are symmetric. Sinuses: Mild mucosal thickening in the ethmoid sinuses. Other: Mastoid air cells are clear. ASPECTS [facility] Stroke  Program Early CT Score) - Ganglionic level infarction (caudate, lentiform nuclei, internal capsule, insula, M1-M3 cortex): 7 - Supraganglionic infarction (M4-M6 cortex): 3 Total score (0-10 with 10 being normal): 10 IMPRESSION: 1. No CT evidence of acute intracranial abnormality. 2. ASPECTS is 10 These results were communicated to Dr. Alecia Ames at 12:46 pm on 01/10/2024 by text page via the [facility] messaging system. Electronically Signed   By: Denny Flack M.D.   On: 01/10/2024 12:47    Procedures Procedures    Medications Ordered in ED Medications  sodium chloride  flush (NS) 0.9 % injection 3 mL (3 mLs Intravenous Not Given 01/10/24 1248)  iohexol  (OMNIPAQUE ) 350 MG/ML injection 75 mL (75 mLs Intravenous Contrast Given 01/10/24 1243)    ED Course/ Medical Decision Making/ A&P Clinical Course as of 01/10/24 1324  Wed Jan 10, 2024  1302 CBC CBC normal.  No significant metabolic abnormalities. [JK]  1303 CT HEAD CODE STROKE WO CONTRAST CT without acute findings [JK]  1324 Discussed with Dr. Elvan Hamel regarding admission [JK]    Clinical Course User Index [JK] Trish Furl, MD                                 Medical Decision Making Problems Addressed: Cerebrovascular accident (CVA), unspecified mechanism [facility]): acute illness or injury that poses a threat to life or bodily functions Hypertension, unspecified type: acute illness or injury that poses a threat to life or  bodily functions  Amount and/or Complexity of Data Reviewed Labs: ordered. Decision-making details documented in ED Course. Radiology: ordered and independent interpretation performed. Decision-making details documented in ED Course.  Risk Decision regarding hospitalization.   Patient presented to the ED for evaluation of acute dizziness confusion left hand numbness that started shortly before arrival.  Code stroke was activated.  Patient not felt to be a TNK candidate based on the severity of symptoms and improving symptoms.  Patient's head CT did not show any acute abnormality.  CT angio head and neck were performed.  Patient noted to be hypertensive on arrival.  Will need MRI to make sure she has not had an acute stroke.  No acute intervention unless >220/120  Pt mentions some urinary frequency now.  Will check a ua       Final Clinical Impression(s) / ED Diagnoses Final diagnoses:  Hypertension, unspecified type  Cerebrovascular accident (CVA), unspecified mechanism [facility])    Rx / DC Orders ED Discharge Orders     None         Trish Furl, MD 01/10/24 1325

## 2024-01-11 ENCOUNTER — Other Ambulatory Visit (HOSPITAL_COMMUNITY): Payer: Self-pay

## 2024-01-11 DIAGNOSIS — G459 Transient cerebral ischemic attack, unspecified: Secondary | ICD-10-CM | POA: Diagnosis not present

## 2024-01-11 DIAGNOSIS — I639 Cerebral infarction, unspecified: Secondary | ICD-10-CM | POA: Diagnosis not present

## 2024-01-11 DIAGNOSIS — I10 Essential (primary) hypertension: Secondary | ICD-10-CM

## 2024-01-11 DIAGNOSIS — F419 Anxiety disorder, unspecified: Secondary | ICD-10-CM | POA: Diagnosis not present

## 2024-01-11 DIAGNOSIS — R410 Disorientation, unspecified: Secondary | ICD-10-CM | POA: Diagnosis not present

## 2024-01-11 LAB — LIPID PANEL
Cholesterol: 222 mg/dL — ABNORMAL HIGH (ref 0–200)
HDL: 93 mg/dL (ref >40)
LDL Cholesterol: 116 mg/dL — ABNORMAL HIGH (ref 0–99)
Total CHOL/HDL Ratio: 2.4 ratio
Triglycerides: 63 mg/dL (ref <150)
VLDL: 13 mg/dL (ref 0–40)

## 2024-01-11 LAB — HEMOGLOBIN A1C
Hgb A1c MFr Bld: 5.3 % (ref 4.8–5.6)
Mean Plasma Glucose: 105.41 mg/dL

## 2024-01-11 MED ORDER — ASPIRIN 81 MG PO TBEC
81.0000 mg | DELAYED_RELEASE_TABLET | Freq: Every day | ORAL | 12 refills | Status: AC
  Filled 2024-01-11: qty 30, 30d supply, fill #0

## 2024-01-11 MED ORDER — ROSUVASTATIN CALCIUM 20 MG PO TABS: 20.0000 mg | ORAL_TABLET | Freq: Every day | ORAL | Status: DC

## 2024-01-11 MED ORDER — ROSUVASTATIN CALCIUM 5 MG PO TABS
10.0000 mg | ORAL_TABLET | Freq: Every day | ORAL | Status: DC
  Administered 2024-01-11: 10 mg via ORAL

## 2024-01-11 MED ORDER — ROSUVASTATIN CALCIUM 10 MG PO TABS
10.0000 mg | ORAL_TABLET | Freq: Every day | ORAL | 1 refills | Status: DC
  Filled 2024-01-11: qty 30, 30d supply, fill #0

## 2024-01-11 MED ORDER — ROSUVASTATIN CALCIUM 5 MG PO TABS
10.0000 mg | ORAL_TABLET | Freq: Every day | ORAL | Status: DC
  Filled 2024-01-11: qty 2

## 2024-01-11 NOTE — Care Management Obs Status (Signed)
 MEDICARE OBSERVATION STATUS NOTIFICATION   Patient Details  Name: Mary Garcia MRN: [id number] Date of Birth: 1946-08-11   Medicare Observation Status Notification Given:  Yes  Moon/Obs letter signed and copy given.  Britnee Mcdevitt 01/11/2024, 11:46 AM

## 2024-01-11 NOTE — Progress Notes (Signed)
 Reviewed AVS, patient expressed understanding of medications, MD follow up reviewed.   Removed IV, Site clean, dry and intact.  Patient states all belongings brought to the hospital at time of admission are accounted for and packed to take home.  Picked up medications from [facility] pharmacy. Pt transported to entrance A where family member was waiting in vehicle to transport home.

## 2024-01-11 NOTE — TOC Transition Note (Signed)
 Transition of Care [facility]) - Discharge Note   Patient Details  Name: Mary Garcia MRN: [id number] Date of Birth: Oct 01, 1945  [facility] of [facility]) CM/SW Contact:  Jonathan Neighbor, RN Phone Number: 01/11/2024, 11:42 AM   Clinical Narrative:     Pt is discharging home with self care. No needs per TOC.  Pt lives with spouse and he works during the daytime.  Pt manages her own medications and denies any issues.  She drives self as needed.  Spouse transporting home.  Final next level of care: Home/Self Care Barriers to Discharge: No Barriers Identified   Patient Goals and CMS Choice            Discharge Placement                       Discharge Plan and Services Additional resources added to the After Visit Summary for                                       Social Drivers of Health (SDOH) Interventions SDOH Screenings   Food Insecurity: No Food Insecurity (01/11/2024)  Housing: Low Risk  (01/11/2024)  Transportation Needs: No Transportation Needs (01/11/2024)  Utilities: Not At Risk (01/11/2024)  Alcohol Screen: Low Risk  (07/16/2023)  Depression (PHQ2-9): Low Risk  (11/23/2023)  Financial Resource Strain: Low Risk  (07/16/2023)  Physical Activity: Insufficiently Active (07/16/2023)  Social Connections: Moderately Isolated (01/11/2024)  Stress: No Stress Concern Present (07/16/2023)  Tobacco Use: Medium Risk (01/10/2024)  Health Literacy: Adequate Health Literacy (11/23/2023)     Readmission Risk Interventions     No data to display

## 2024-01-11 NOTE — Evaluation (Signed)
 Physical Therapy Evaluation and DIscharge Patient Details Name: Mary Garcia MRN: [id number] DOB: Oct 10, 1945 Today's Date: 01/11/2024  History of Present Illness  Pt is 78 yo female who presents on 01/10/24 from allergist due to feeling dizzy, confused, and BP elevated. L hand numb. CT neg. MRI neg. Pupils unequal. PMH: allergic rhinitis, asthma, eczema, TKA 2023  Clinical Impression  Patient evaluated by Physical Therapy with no further acute PT needs identified. All education has been completed and the patient has no further questions. Pt from home with husband, is independent at baseline, very active with exercising and family life. Pt reports resolution of symptoms but does endorse posterior HA, faces 4/10. Pt independent with mobility without deficits. BP after ambulation 153/94, SPO2 98%, HR 74 bpm.  No follow-up Physical Therapy or equipment needs. PT is signing off. Thank you for this referral.         If plan is discharge home, recommend the following:  N/A   Can travel by private vehicle    yes    Equipment Recommendations None recommended by PT  Recommendations for Other Services       Functional Status Assessment Patient has not had a recent decline in their functional status     Precautions / Restrictions Precautions Precautions: None Restrictions Weight Bearing Restrictions Per Provider Order: No      Mobility  Bed Mobility Overal bed mobility: Independent                  Transfers Overall transfer level: Independent Equipment used: None                    Ambulation/Gait Ambulation/Gait assistance: Independent Gait Distance (Feet): 400 Feet Assistive device: None Gait Pattern/deviations: WFL(Within Functional Limits) Gait velocity: WFL Gait velocity interpretation: >4.37 ft/sec, indicative of normal walking speed   General Gait Details: able to maintain normal pace and change pace without LOB. No LOB with head turns or vertical gaze  change. Normal mvmt patterns noted  Stairs Stairs: Yes Stairs assistance: Independent Stair Management: One rail Right, Alternating pattern, Forwards Number of Stairs: 5 General stair comments: no difficulty with stairs, HR increased to low 100's, dropped back to low 80's within 2 mins  Wheelchair Mobility     Tilt Bed    Modified Rankin (Stroke Patients Only) Modified Rankin (Stroke Patients Only) Pre-Morbid Rankin Score: No symptoms Modified Rankin: No symptoms     Balance Overall balance assessment: Independent                                           Pertinent Vitals/Pain Pain Assessment Pain Assessment: Faces Faces Pain Scale: Hurts little more Pain Location: posterior HA Pain Descriptors / Indicators: Headache Pain Intervention(s): Limited activity within patient's tolerance, Monitored during session    Home Living Family/patient expects to be discharged to:: Private residence Living Arrangements: Spouse/significant other Available Help at Discharge: Family;Available PRN/intermittently Type of Home: House Home Access: Level entry       Home Layout: One level Home Equipment: Agricultural consultant (2 wheels);BSC/3in1;Cane - single point Additional Comments: lives with husband who works part time. Very active with exercise classes and grandkids    Prior Function Prior Level of Function : Independent/Modified Independent;Driving             Mobility Comments: independent, going on ebike trip to [facility] 8/25 ADLs Comments: independent  Extremity/Trunk Assessment   Upper Extremity Assessment Upper Extremity Assessment: Overall WFL for tasks assessed    Lower Extremity Assessment Lower Extremity Assessment: Overall WFL for tasks assessed    Cervical / Trunk Assessment Cervical / Trunk Assessment: Normal  Communication   Communication Communication: No apparent difficulties    Cognition Arousal: Alert Behavior During Therapy: WFL  for tasks assessed/performed   PT - Cognitive impairments: No apparent impairments                         Following commands: Intact       Cueing Cueing Techniques: Verbal cues     General Comments General comments (skin integrity, edema, etc.): BP after ambulation 153/94, SPO2 98%, HR 74 bpm    Exercises     Assessment/Plan    PT Assessment Patient does not need any further PT services  PT Problem List         PT Treatment Interventions      PT Goals (Current goals can be found in the Care Plan section)  Acute Rehab PT Goals Patient Stated Goal: return home PT Goal Formulation: All assessment and education complete, DC therapy    Frequency       Co-evaluation               AM-PAC PT "6 Clicks" Mobility  Outcome Measure Help needed turning from your back to your side while in a flat bed without using bedrails?: None Help needed moving from lying on your back to sitting on the side of a flat bed without using bedrails?: None Help needed moving to and from a bed to a chair (including a wheelchair)?: None Help needed standing up from a chair using your arms (e.g., wheelchair or bedside chair)?: None Help needed to walk in hospital room?: None Help needed climbing 3-5 steps with a railing? : None 6 Click Score: 24    End of Session   Activity Tolerance: Patient tolerated treatment well Patient left: in bed;with call bell/phone within reach;with family/visitor present Nurse Communication: Mobility status PT Visit Diagnosis: Unsteadiness on feet (R26.81)    Time: 1235-1310 PT Time Calculation (min) (ACUTE ONLY): 35 min   Charges:   PT Evaluation $PT Eval Low Complexity: 1 Low PT Treatments $Gait Training: 8-22 mins PT General Charges $$ ACUTE PT VISIT: 1 Visit         Amey Ka, PT  Acute Rehab Services Secure chat preferred Office [phone]   Deloris Fetters Denelda Akerley 01/11/2024, 2:21 PM

## 2024-01-11 NOTE — Progress Notes (Addendum)
 STROKE TEAM PROGRESS NOTE    INTERIM HISTORY/SUBJECTIVE Husband and PT are at the bedside. Pt no acute event overnight, and now symptoms free. MRI negative. Pt stated that she had full schedule yesterday and was Mary Garcia going to her doctor's appointment but found to have high BP and then BP even higher with EMS and in ED.   OBJECTIVE  CBC    Component Value Date/Time   WBC 6.1 01/10/2024 1226   RBC 4.91 01/10/2024 1226   HGB 14.6 01/10/2024 1231   HCT 43.0 01/10/2024 1231   PLT 290 01/10/2024 1226   MCV 87.0 01/10/2024 1226   MCH 29.1 01/10/2024 1226   MCHC 33.5 01/10/2024 1226   RDW 14.1 01/10/2024 1226   LYMPHSABS 1.6 01/10/2024 1226   MONOABS 0.5 01/10/2024 1226   EOSABS 0.1 01/10/2024 1226   BASOSABS 0.0 01/10/2024 1226    BMET    Component Value Date/Time   NA 139 01/10/2024 1231   K 3.7 01/10/2024 1231   CL 104 01/10/2024 1231   CO2 25 01/10/2024 1226   GLUCOSE 106 (H) 01/10/2024 1231   BUN 16 01/10/2024 1231   CREATININE 0.80 01/10/2024 1231   CREATININE 0.75 11/14/2023 1128   CALCIUM  9.6 01/10/2024 1226   EGFR 76 11/21/2022 0944   GFRNONAA >60 01/10/2024 1226   GFRNONAA 76 11/03/2020 0932    IMAGING past 24 hours ECHOCARDIOGRAM COMPLETE Result Date: 01/10/2024    ECHOCARDIOGRAM REPORT   Patient Name:   Mary Garcia Date of Exam: 01/10/2024 Medical Rec #:  [id number]  Height:       63.0 in Accession #:    [id number] Weight:       145.5 lb Date of Birth:  1945-08-22  BSA:          1.689 m Patient Age:    77 years   BP:           190/104 mmHg Patient Gender: F          HR:           7` bpm. Exam Location:  Inpatient Procedure: 2D Echo, Cardiac Doppler and Color Doppler (Both Spectral and Color            Flow Doppler were utilized during procedure). Indications:    TIA  History:        Patient has no prior history of Echocardiogram examinations.  Sonographer:    Andrena Bang Referring Phys: [phone] NOAH BEDFORD NUUV  Sonographer Comments: Pt has breast implants. IMPRESSIONS   1. Left ventricular ejection fraction, by estimation, is 60 to 65%. The left ventricle has normal function. The left ventricle has no regional wall motion abnormalities. There is mild left ventricular hypertrophy. Left ventricular diastolic parameters are consistent with Grade I diastolic dysfunction (impaired relaxation).  2. Right ventricular systolic function is normal. The right ventricular size is normal. There is normal pulmonary artery systolic pressure.  3. The mitral valve is normal in structure. No evidence of mitral valve regurgitation. No evidence of mitral stenosis.  4. The aortic valve is tricuspid. Aortic valve regurgitation is not visualized. No aortic stenosis is present.  5. The inferior vena cava is normal in size with greater than 50% respiratory variability, suggesting right atrial pressure of 3 mmHg. FINDINGS  Left Ventricle: Left ventricular ejection fraction, by estimation, is 60 to 65%. The left ventricle has normal function. The left ventricle has no regional wall motion abnormalities. The left ventricular internal cavity size was normal in size.  There is  mild left ventricular hypertrophy. Left ventricular diastolic parameters are consistent with Grade I diastolic dysfunction (impaired relaxation). Indeterminate filling pressures. Right Ventricle: The right ventricular size is normal. No increase in right ventricular wall thickness. Right ventricular systolic function is normal. There is normal pulmonary artery systolic pressure. The tricuspid regurgitant velocity is 1.75 m/s, and  with an assumed right atrial pressure of 3 mmHg, the estimated right ventricular systolic pressure is 15.2 mmHg. Left Atrium: Left atrial size was normal in size. Right Atrium: Right atrial size was normal in size. Pericardium: There is no evidence of pericardial effusion. Mitral Valve: The mitral valve is normal in structure. Mild mitral annular calcification. No evidence of mitral valve regurgitation. No  evidence of mitral valve stenosis. Tricuspid Valve: The tricuspid valve is normal in structure. Tricuspid valve regurgitation is trivial. No evidence of tricuspid stenosis. Aortic Valve: The aortic valve is tricuspid. Aortic valve regurgitation is not visualized. No aortic stenosis is present. Aortic valve mean gradient measures 4.0 mmHg. Aortic valve peak gradient measures 11.7 mmHg. Aortic valve area, by VTI measures 1.85  cm. Pulmonic Valve: The pulmonic valve was normal in structure. Pulmonic valve regurgitation is not visualized. No evidence of pulmonic stenosis. Aorta: The aortic root is normal in size and structure. Venous: The inferior vena cava is normal in size with greater than 50% respiratory variability, suggesting right atrial pressure of 3 mmHg. IAS/Shunts: No atrial level shunt detected by color flow Doppler.  LEFT VENTRICLE PLAX 2D LVIDd:         3.40 cm     Diastology LVIDs:         2.20 cm     LV e' medial:    9.68 cm/s LV PW:         1.20 cm     LV E/e' medial:  8.1 LV IVS:        1.00 cm     LV e' lateral:   6.64 cm/s LVOT diam:     1.70 cm     LV E/e' lateral: 11.8 LV SV:         56 LV SV Index:   33 LVOT Area:     2.27 cm  LV Volumes (MOD) LV vol d, MOD A2C: 56.7 ml LV vol d, MOD A4C: 62.2 ml LV vol s, MOD A2C: 19.0 ml LV vol s, MOD A4C: 19.3 ml LV SV MOD A2C:     37.7 ml LV SV MOD A4C:     62.2 ml LV SV MOD BP:      40.0 ml RIGHT VENTRICLE RV S prime:     24.20 cm/s TAPSE (M-mode): 1.3 cm LEFT ATRIUM             Index LA diam:        3.90 cm 2.31 cm/m LA Vol (A2C):   24.8 ml 14.68 ml/m LA Vol (A4C):   31.0 ml 18.35 ml/m LA Biplane Vol: 28.8 ml 17.05 ml/m  AORTIC VALVE AV Area (Vmax):    1.79 cm AV Area (Vmean):   1.80 cm AV Area (VTI):     1.85 cm AV Vmax:           171.00 cm/s AV Vmean:          95.000 cm/s AV VTI:            0.303 m AV Peak Grad:      11.7 mmHg AV Mean Grad:      4.0 mmHg  LVOT Vmax:         135.00 cm/s LVOT Vmean:        75.200 cm/s LVOT VTI:          0.247 m  LVOT/AV VTI ratio: 0.82  AORTA Ao Asc diam: 3.10 cm MITRAL VALVE               TRICUSPID VALVE MV Area (PHT): 2.39 cm    TR Peak grad:   12.2 mmHg MV Decel Time: 318 msec    TR Vmax:        175.00 cm/s MV E velocity: 78.20 cm/s MV A velocity: 96.30 cm/s  SHUNTS MV E/A ratio:  0.81        Systemic VTI:  0.25 m                            Systemic Diam: 1.70 cm Maudine Sos MD Electronically signed by Maudine Sos MD Signature Date/Time: 01/10/2024/5:32:29 PM    Final    MR BRAIN WO CONTRAST Result Date: 01/10/2024 CLINICAL DATA:  Neuro deficit, acute, stroke suspected. Acute dizziness, confusion, and left hand numbness. EXAM: MRI HEAD WITHOUT CONTRAST TECHNIQUE: Multiplanar, multiecho pulse sequences of the brain and surrounding structures were obtained without intravenous contrast. COMPARISON:  Head CT and CTA 01/10/2024 FINDINGS: Brain: There is no evidence of an acute infarct, intracranial hemorrhage, mass, midline shift, or extra-axial fluid collection. Cerebral volume is normal for age. The ventricles are normal in size. Scattered small T2 hyperintensities in the cerebral white matter bilaterally are nonspecific but compatible with mild chronic small vessel ischemic disease. Vascular: Major intracranial vascular flow voids are preserved. Skull and upper cervical spine: Unremarkable bone marrow signal. Sinuses/Orbits: Bilateral cataract extraction. Minimal right ethmoid sinus mucosal thickening. Clear mastoid air cells. Other: None. IMPRESSION: 1. No acute intracranial abnormality. 2. Mild chronic small vessel ischemic disease. Electronically Signed   By: Aundra Lee M.D.   On: 01/10/2024 15:46   CT ANGIO HEAD NECK W WO CM (CODE STROKE) Result Date: 01/10/2024 CLINICAL DATA:  Code stroke, neuro deficit. EXAM: CT ANGIOGRAPHY HEAD AND NECK WITH AND WITHOUT CONTRAST TECHNIQUE: Multidetector CT imaging of the head and neck was performed using the standard protocol during bolus administration of  intravenous contrast. Multiplanar CT image reconstructions and MIPs were obtained to evaluate the vascular anatomy. Carotid stenosis measurements (when applicable) are obtained utilizing NASCET criteria, using the distal internal carotid diameter as the denominator. RADIATION DOSE REDUCTION: This exam was performed according to the departmental dose-optimization program which includes automated exposure control, adjustment of the mA and/or kV according to patient size and/or use of iterative reconstruction technique. CONTRAST:  75mL OMNIPAQUE  IOHEXOL  350 MG/ML SOLN COMPARISON:  Same-day head CT. FINDINGS: CTA NECK FINDINGS Aortic arch: Standard configuration of the aortic arch. Imaged portion shows no evidence of aneurysm or dissection. No significant stenosis of the major arch vessel origins. Pulmonary arteries: As permitted by contrast timing, there are no filling defects in the visualized pulmonary arteries. Subclavian arteries: The subclavian arteries are patent bilaterally. Right carotid system: Patent from the origin to the skull base. Mild tortuosity of the proximal common carotid artery. Carotid bifurcation widely patent without significant atherosclerosis or high-grade stenosis. Left carotid system: Patent from the origin to the skull base. Mild atherosclerosis at the carotid bifurcation. No high-grade stenosis. Vertebral arteries: Codominant. No evidence of dissection, stenosis (50% or greater), or occlusion. Skeleton: No acute findings. Degenerative changes in the cervical  spine. Disc space narrowing greatest at C4-5 and C5-6. Other neck: The visualized airway is patent. No cervical lymphadenopathy. Upper chest: Visualized lung apices are clear. Review of the MIP images confirms the above findings CTA HEAD FINDINGS ANTERIOR CIRCULATION: The intracranial ICAs are patent bilaterally. Minimal atherosclerosis of the carotid siphons. No significant stenosis, proximal occlusion, aneurysm, or vascular  malformation. MCAs: The middle cerebral arteries are patent bilaterally. ACAs: The anterior cerebral arteries are patent bilaterally. POSTERIOR CIRCULATION: No significant stenosis, proximal occlusion, aneurysm, or vascular malformation. PCAs: The posterior cerebral arteries are patent bilaterally. Pcomm: Visualized bilaterally, more pronounced on the left. SCAs: The superior cerebellar arteries are patent bilaterally. Basilar artery: Patent AICAs: Visualized on the right. PICAs: Visualized on the left. Vertebral arteries: The intracranial vertebral arteries are patent. Venous sinuses: As permitted by contrast timing, patent. Anatomic variants: None Review of the MIP images confirms the above findings IMPRESSION: No large vessel occlusion. No high-grade stenosis, aneurysm, or dissection of the arteries in the head and neck. Mild atherosclerosis as above. Electronically Signed   By: Denny Flack M.D.   On: 01/10/2024 13:07   CT HEAD CODE STROKE WO CONTRAST Result Date: 01/10/2024 CLINICAL DATA:  Code stroke.  Neuro deficit, concern for stroke. EXAM: CT HEAD WITHOUT CONTRAST TECHNIQUE: Contiguous axial images were obtained from the base of the skull through the vertex without intravenous contrast. RADIATION DOSE REDUCTION: This exam was performed according to the departmental dose-optimization program which includes automated exposure control, adjustment of the mA and/or kV according to patient size and/or use of iterative reconstruction technique. COMPARISON:  None Available. FINDINGS: Brain: No acute intracranial hemorrhage. No CT evidence of acute infarct. No edema, mass effect, or midline shift. The basilar cisterns are patent. Ventricles: The ventricles are normal. Vascular: No hyperdense vessel or unexpected calcification. Skull: No acute or aggressive finding. Orbits: Orbits are symmetric. Sinuses: Mild mucosal thickening in the ethmoid sinuses. Other: Mastoid air cells are clear. ASPECTS [facility] Stroke  Program Early CT Score) - Ganglionic level infarction (caudate, lentiform nuclei, internal capsule, insula, M1-M3 cortex): 7 - Supraganglionic infarction (M4-M6 cortex): 3 Total score (0-10 with 10 being normal): 10 IMPRESSION: 1. No CT evidence of acute intracranial abnormality. 2. ASPECTS is 10 These results were communicated to Dr. Alecia Ames at 12:46 pm on 01/10/2024 by text page via the [facility] messaging system. Electronically Signed   By: Denny Flack M.D.   On: 01/10/2024 12:47    Vitals:   01/11/24 0011 01/11/24 0331 01/11/24 0821 01/11/24 0855  BP: 118/74 122/76 123/83   Pulse: 65 69 69   Resp: 16 16 18    Temp: 98.4 F (36.9 C) (!) 97.5 F (36.4 C) (!) 97.5 F (36.4 C)   TempSrc: Oral Oral Oral   SpO2: 99% 99% 98%   Weight:      Height:    5\' 2"  (1.575 m)     PHYSICAL EXAM General:  Alert, well-nourished, well-developed patient in no acute distress Psych:  Mood and affect appropriate for situation CV: Regular rate and rhythm on monitor Respiratory:  Regular, unlabored respirations on room air  NEURO:  Mental Status: AA&Ox3, patient is able to give clear and coherent history Speech/Language: speech is without dysarthria or aphasia.    Cranial Nerves:  II: PERRL. Visual fields full.  III, IV, VI: EOMI. Eyelids elevate symmetrically. Left pupil 2mm and right pupil 3mm, more different with light than in the dark V: Sensation is intact to light touch and symmetrical to face.  VII:  Face is symmetrical resting and smiling VIII: hearing intact to voice. IX, X: Phonation is normal.  VH:QIONGEXB shrug 5/5. XII: tongue is midline without fasciculations. Motor: Able to move all 4 extremities with good antigravity strength Tone: is normal and bulk is normal Sensation- Intact to light touch bilaterally.    Coordination: FTN intact bilaterally Gait- deferred  Most Recent NIH 0   ASSESSMENT/PLAN  Mary Garcia is a 78 y.o. female with history of asthma and eczema admitted  for acute onset dizziness and numbness of the left hand along with hypertension.  Symptoms were too mild to treat with TNK.  Symptoms have resolved, and MRI was negative for stroke.  Patient reports that she has never had hypertension in the past, and TIA is the most likely etiology of her symptoms.  NIH on Admission 1  Most likely related to anxiety and elevated BP, less likely TIA Code Stroke CT head No acute abnormality. ASPECTS 10.    CTA head & neck no LVO or high-grade stenosis MRI no acute abnormality, mild chronic small vessel ischemic disease 2D Echo EF 60 to 65%, grade 1 diastolic dysfunction, normal left atrial size, no atrial level shunt LDL 116 HgbA1c 5.3 VTE prophylaxis -Lovenox No antithrombotic prior to admission, now on aspirin  81 mg daily at discharge given not able to rule out TIA completely.  Therapy recommendations:  Pending Disposition: pending, likely home  Hypertension Home meds: None Patient reports no history of hypertension but was noted to be quite hypertensive at the time of onset of symptoms Maintain normotension  Hyperlipidemia Home meds: None LDL 116, goal < 100 Add rosuvastatin  10 mg daily Continue statin at discharge  Other Stroke Risk Factors Advanced age  Other Active Problems Anisocoria - likely chronic, right pupil bigger than left, within physiological range. More prominent under light than in the dark but no other neurological findings Hx of asthma  Hospital day # 0  Patient seen by NP and then by MD, MD to edit note as needed. Cortney E Bucky Cardinal , MSN, AGACNP-BC [facility] See Amion for schedule and pager information 01/11/2024 11:58 AM  ATTENDING NOTE: I reviewed above note and agree with the assessment and plan. Pt was seen and examined.   Husband and PT are at the bedside. Pt clinical presentation more likely anxiety with BP elevation but less likely TIA but OK with continue ASA given risk factors. Continue low dose  statin. Pt did have mild anisocoria but no other neuro deficit, difference only 1mm, likely physiologic. Can check her ophthalmological record. [facility]  For detailed assessment and plan, please refer to above as I have made changes wherever appropriate.   Consuelo Denmark, MD PhD Stroke Neurology 01/11/2024 12:54 PM      To contact Stroke Continuity provider, please refer to [email or web address]. After hours, contact General Neurology

## 2024-01-12 NOTE — Discharge Summary (Signed)
 Physician Discharge Summary   Patient: Mary Garcia MRN: [id number] DOB: 10/21/1945  Admit date:     01/10/2024  Discharge date: 01/11/2024  Discharge Physician: Feliciana Horn   PCP: Sylvan Evener, MD   Recommendations at discharge:  Please follow up with PCP in one week.  Please follow up with Neurology in 4 weeks.   Discharge Diagnoses: Principal Problem:   TIA (transient ischemic attack) Active Problems:   Confusion   Hospital Course:  Mary Garcia is a 78 y.o. female with medical history significant for osteoporosis presents  with confusion, lightheaded and some left arm/hand tingling.  She was admitted for TIA work up.   Assessment and Plan:  Left arm tingling and confusion Appears to have resolved.  CT head negative for acute abnormality.  MRI shows mild chronic small vessel ischemic disease.  2 d Echo shows LVEF of 60 65% and grade 1 diastolic dysfunction.  LDL is 116, added crestor  10 mg daily on discharge.  A1c is 5.3 % Discharged on aspirin  81  mg daily.  Recommend outpatient follow up with neurology.    Hypertension Well controlled.    Hyperlipidemia Resume statin.     Consultants: neurology.  Procedures performed: MRI brain.   Disposition: Home Diet recommendation:  Discharge Diet Orders (From admission, onward)     Start     Ordered   01/11/24 0000  Diet - low sodium heart healthy        01/11/24 1250           Regular diet DISCHARGE MEDICATION: Allergies as of 01/11/2024   No Known Allergies      Medication List     STOP taking these medications    ibuprofen 200 MG tablet Commonly known as: ADVIL       TAKE these medications    acyclovir 400 MG tablet Commonly known as: ZOVIRAX Take 400 mg by mouth every 4 (four) hours as needed (fever blister).   albuterol  108 (90 Base) MCG/ACT inhaler Commonly known as: Ventolin  HFA Inhale 2 puffs into the lungs every 4 (four) hours as needed for wheezing or shortness of breath (coughing  fits).   ALPRAZolam  0.5 MG tablet Commonly known as: Xanax  One half to one tab twice daily as needed for anxiety.   aspirin  EC 81 MG tablet Take 1 tablet (81 mg total) by mouth daily. Swallow whole.   azelastine  0.1 % nasal spray Commonly known as: ASTELIN  Place 1-2 sprays in each nostril one to two times a day as needed for runny nose/drainage down throat   Breyna  80-4.5 MCG/ACT inhaler Generic drug: budesonide -formoterol  INHALE 2 PUFFS INTO LUNGS TWICE DAILY RINSE MOUTH AFTER USE   CALCIUM  + VITAMIN D3 PO Take 1 tablet by mouth at bedtime.   ELDERBERRY PO Take 1 capsule by mouth 2 (two) times daily.   ketoconazole 2 % cream Commonly known as: NIZORAL Apply 1 application topically 2 (two) times daily as needed (rash).   Multivitamin Women 50+ Tabs Take 1 tablet by mouth at bedtime.   rosuvastatin  10 MG tablet Commonly known as: CRESTOR  Take 1 tablet (10 mg total) by mouth daily.   tretinoin 0.1 % cream Commonly known as: RETIN-A Apply 1 application  topically See admin instructions. Apply topically to face at night, 3 day a week.   zolpidem  10 MG tablet Commonly known as: AMBIEN  TAKE 1 TABLET BY MOUTH EVERY DAY AT BEDTIME AS NEEDED FOR SLEEP What changed: See the new instructions.  Follow-up Information     Baxley, Jaynie Meyers, MD. Schedule an appointment as soon as possible for a visit in 1 week(s).   Specialty: Internal Medicine Contact information: [address] [place] [address] [phone]         [facility]. Schedule an appointment as soon as possible for a visit in 1 month(s).   Specialty: Neurology Why: stroke clinic Contact information: [address] [place]  [address] [phone]               Discharge Exam: Mary Garcia Weights   01/10/24 1200  Weight: 66 kg   General exam: Appears calm and comfortable  Respiratory system: Clear to auscultation. Respiratory  effort normal. Cardiovascular system: S1 & S2 heard, RRR. No JVD,  Gastrointestinal system: Abdomen is nondistended, soft and nontender.  Central nervous system: Alert and oriented. No focal neurological deficits. Extremities: Symmetric 5 x 5 power. Skin: No rashes,  Psychiatry:  Mood & affect appropriate.    Condition at discharge: fair  The results of significant diagnostics from this hospitalization (including imaging, microbiology, ancillary and laboratory) are listed below for reference.   Imaging Studies: ECHOCARDIOGRAM COMPLETE Result Date: 01/10/2024    ECHOCARDIOGRAM REPORT   Patient Name:   Mary Garcia Date of Exam: 01/10/2024 Medical Rec #:  [id number]  Height:       63.0 in Accession #:    [id number] Weight:       145.5 lb Date of Birth:  11-11-1945  BSA:          1.689 m Patient Age:    77 years   BP:           190/104 mmHg Patient Gender: F          HR:           7` bpm. Exam Location:  Inpatient Procedure: 2D Echo, Cardiac Doppler and Color Doppler (Both Spectral and Color            Flow Doppler were utilized during procedure). Indications:    TIA  History:        Patient has no prior history of Echocardiogram examinations.  Sonographer:    Andrena Bang Referring Phys: [phone] NOAH BEDFORD JJOA  Sonographer Comments: Pt has breast implants. IMPRESSIONS  1. Left ventricular ejection fraction, by estimation, is 60 to 65%. The left ventricle has normal function. The left ventricle has no regional wall motion abnormalities. There is mild left ventricular hypertrophy. Left ventricular diastolic parameters are consistent with Grade I diastolic dysfunction (impaired relaxation).  2. Right ventricular systolic function is normal. The right ventricular size is normal. There is normal pulmonary artery systolic pressure.  3. The mitral valve is normal in structure. No evidence of mitral valve regurgitation. No evidence of mitral stenosis.  4. The aortic valve is tricuspid. Aortic valve regurgitation is  not visualized. No aortic stenosis is present.  5. The inferior vena cava is normal in size with greater than 50% respiratory variability, suggesting right atrial pressure of 3 mmHg. FINDINGS  Left Ventricle: Left ventricular ejection fraction, by estimation, is 60 to 65%. The left ventricle has normal function. The left ventricle has no regional wall motion abnormalities. The left ventricular internal cavity size was normal in size. There is  mild left ventricular hypertrophy. Left ventricular diastolic parameters are consistent with Grade I diastolic dysfunction (impaired relaxation). Indeterminate filling pressures. Right Ventricle: The right ventricular size is normal. No increase in right ventricular  wall thickness. Right ventricular systolic function is normal. There is normal pulmonary artery systolic pressure. The tricuspid regurgitant velocity is 1.75 m/s, and  with an assumed right atrial pressure of 3 mmHg, the estimated right ventricular systolic pressure is 15.2 mmHg. Left Atrium: Left atrial size was normal in size. Right Atrium: Right atrial size was normal in size. Pericardium: There is no evidence of pericardial effusion. Mitral Valve: The mitral valve is normal in structure. Mild mitral annular calcification. No evidence of mitral valve regurgitation. No evidence of mitral valve stenosis. Tricuspid Valve: The tricuspid valve is normal in structure. Tricuspid valve regurgitation is trivial. No evidence of tricuspid stenosis. Aortic Valve: The aortic valve is tricuspid. Aortic valve regurgitation is not visualized. No aortic stenosis is present. Aortic valve mean gradient measures 4.0 mmHg. Aortic valve peak gradient measures 11.7 mmHg. Aortic valve area, by VTI measures 1.85  cm. Pulmonic Valve: The pulmonic valve was normal in structure. Pulmonic valve regurgitation is not visualized. No evidence of pulmonic stenosis. Aorta: The aortic root is normal in size and structure. Venous: The inferior  vena cava is normal in size with greater than 50% respiratory variability, suggesting right atrial pressure of 3 mmHg. IAS/Shunts: No atrial level shunt detected by color flow Doppler.  LEFT VENTRICLE PLAX 2D LVIDd:         3.40 cm     Diastology LVIDs:         2.20 cm     LV e' medial:    9.68 cm/s LV PW:         1.20 cm     LV E/e' medial:  8.1 LV IVS:        1.00 cm     LV e' lateral:   6.64 cm/s LVOT diam:     1.70 cm     LV E/e' lateral: 11.8 LV SV:         56 LV SV Index:   33 LVOT Area:     2.27 cm  LV Volumes (MOD) LV vol d, MOD A2C: 56.7 ml LV vol d, MOD A4C: 62.2 ml LV vol s, MOD A2C: 19.0 ml LV vol s, MOD A4C: 19.3 ml LV SV MOD A2C:     37.7 ml LV SV MOD A4C:     62.2 ml LV SV MOD BP:      40.0 ml RIGHT VENTRICLE RV S prime:     24.20 cm/s TAPSE (M-mode): 1.3 cm LEFT ATRIUM             Index LA diam:        3.90 cm 2.31 cm/m LA Vol (A2C):   24.8 ml 14.68 ml/m LA Vol (A4C):   31.0 ml 18.35 ml/m LA Biplane Vol: 28.8 ml 17.05 ml/m  AORTIC VALVE AV Area (Vmax):    1.79 cm AV Area (Vmean):   1.80 cm AV Area (VTI):     1.85 cm AV Vmax:           171.00 cm/s AV Vmean:          95.000 cm/s AV VTI:            0.303 m AV Peak Grad:      11.7 mmHg AV Mean Grad:      4.0 mmHg LVOT Vmax:         135.00 cm/s LVOT Vmean:        75.200 cm/s LVOT VTI:          0.247 m  LVOT/AV VTI ratio: 0.82  AORTA Ao Asc diam: 3.10 cm MITRAL VALVE               TRICUSPID VALVE MV Area (PHT): 2.39 cm    TR Peak grad:   12.2 mmHg MV Decel Time: 318 msec    TR Vmax:        175.00 cm/s MV E velocity: 78.20 cm/s MV A velocity: 96.30 cm/s  SHUNTS MV E/A ratio:  0.81        Systemic VTI:  0.25 m                            Systemic Diam: 1.70 cm Maudine Sos MD Electronically signed by Maudine Sos MD Signature Date/Time: 01/10/2024/5:32:29 PM    Final    MR BRAIN WO CONTRAST Result Date: 01/10/2024 CLINICAL DATA:  Neuro deficit, acute, stroke suspected. Acute dizziness, confusion, and left hand numbness. EXAM: MRI HEAD  WITHOUT CONTRAST TECHNIQUE: Multiplanar, multiecho pulse sequences of the brain and surrounding structures were obtained without intravenous contrast. COMPARISON:  Head CT and CTA 01/10/2024 FINDINGS: Brain: There is no evidence of an acute infarct, intracranial hemorrhage, mass, midline shift, or extra-axial fluid collection. Cerebral volume is normal for age. The ventricles are normal in size. Scattered small T2 hyperintensities in the cerebral white matter bilaterally are nonspecific but compatible with mild chronic small vessel ischemic disease. Vascular: Major intracranial vascular flow voids are preserved. Skull and upper cervical spine: Unremarkable bone marrow signal. Sinuses/Orbits: Bilateral cataract extraction. Minimal right ethmoid sinus mucosal thickening. Clear mastoid air cells. Other: None. IMPRESSION: 1. No acute intracranial abnormality. 2. Mild chronic small vessel ischemic disease. Electronically Signed   By: Aundra Lee M.D.   On: 01/10/2024 15:46   CT ANGIO HEAD NECK W WO CM (CODE STROKE) Result Date: 01/10/2024 CLINICAL DATA:  Code stroke, neuro deficit. EXAM: CT ANGIOGRAPHY HEAD AND NECK WITH AND WITHOUT CONTRAST TECHNIQUE: Multidetector CT imaging of the head and neck was performed using the standard protocol during bolus administration of intravenous contrast. Multiplanar CT image reconstructions and MIPs were obtained to evaluate the vascular anatomy. Carotid stenosis measurements (when applicable) are obtained utilizing NASCET criteria, using the distal internal carotid diameter as the denominator. RADIATION DOSE REDUCTION: This exam was performed according to the departmental dose-optimization program which includes automated exposure control, adjustment of the mA and/or kV according to patient size and/or use of iterative reconstruction technique. CONTRAST:  75mL OMNIPAQUE  IOHEXOL  350 MG/ML SOLN COMPARISON:  Same-day head CT. FINDINGS: CTA NECK FINDINGS Aortic arch: Standard  configuration of the aortic arch. Imaged portion shows no evidence of aneurysm or dissection. No significant stenosis of the major arch vessel origins. Pulmonary arteries: As permitted by contrast timing, there are no filling defects in the visualized pulmonary arteries. Subclavian arteries: The subclavian arteries are patent bilaterally. Right carotid system: Patent from the origin to the skull base. Mild tortuosity of the proximal common carotid artery. Carotid bifurcation widely patent without significant atherosclerosis or high-grade stenosis. Left carotid system: Patent from the origin to the skull base. Mild atherosclerosis at the carotid bifurcation. No high-grade stenosis. Vertebral arteries: Codominant. No evidence of dissection, stenosis (50% or greater), or occlusion. Skeleton: No acute findings. Degenerative changes in the cervical spine. Disc space narrowing greatest at C4-5 and C5-6. Other neck: The visualized airway is patent. No cervical lymphadenopathy. Upper chest: Visualized lung apices are clear. Review of the MIP images confirms the above findings CTA  HEAD FINDINGS ANTERIOR CIRCULATION: The intracranial ICAs are patent bilaterally. Minimal atherosclerosis of the carotid siphons. No significant stenosis, proximal occlusion, aneurysm, or vascular malformation. MCAs: The middle cerebral arteries are patent bilaterally. ACAs: The anterior cerebral arteries are patent bilaterally. POSTERIOR CIRCULATION: No significant stenosis, proximal occlusion, aneurysm, or vascular malformation. PCAs: The posterior cerebral arteries are patent bilaterally. Pcomm: Visualized bilaterally, more pronounced on the left. SCAs: The superior cerebellar arteries are patent bilaterally. Basilar artery: Patent AICAs: Visualized on the right. PICAs: Visualized on the left. Vertebral arteries: The intracranial vertebral arteries are patent. Venous sinuses: As permitted by contrast timing, patent. Anatomic variants: None  Review of the MIP images confirms the above findings IMPRESSION: No large vessel occlusion. No high-grade stenosis, aneurysm, or dissection of the arteries in the head and neck. Mild atherosclerosis as above. Electronically Signed   By: Denny Flack M.D.   On: 01/10/2024 13:07   CT HEAD CODE STROKE WO CONTRAST Result Date: 01/10/2024 CLINICAL DATA:  Code stroke.  Neuro deficit, concern for stroke. EXAM: CT HEAD WITHOUT CONTRAST TECHNIQUE: Contiguous axial images were obtained from the base of the skull through the vertex without intravenous contrast. RADIATION DOSE REDUCTION: This exam was performed according to the departmental dose-optimization program which includes automated exposure control, adjustment of the mA and/or kV according to patient size and/or use of iterative reconstruction technique. COMPARISON:  None Available. FINDINGS: Brain: No acute intracranial hemorrhage. No CT evidence of acute infarct. No edema, mass effect, or midline shift. The basilar cisterns are patent. Ventricles: The ventricles are normal. Vascular: No hyperdense vessel or unexpected calcification. Skull: No acute or aggressive finding. Orbits: Orbits are symmetric. Sinuses: Mild mucosal thickening in the ethmoid sinuses. Other: Mastoid air cells are clear. ASPECTS [facility] Stroke Program Early CT Score) - Ganglionic level infarction (caudate, lentiform nuclei, internal capsule, insula, M1-M3 cortex): 7 - Supraganglionic infarction (M4-M6 cortex): 3 Total score (0-10 with 10 being normal): 10 IMPRESSION: 1. No CT evidence of acute intracranial abnormality. 2. ASPECTS is 10 These results were communicated to Dr. Alecia Ames at 12:46 pm on 01/10/2024 by text page via the [facility] messaging system. Electronically Signed   By: Denny Flack M.D.   On: 01/10/2024 12:47    Microbiology: Results for orders placed or performed in visit on 11/22/22  Urine Culture     Status: Abnormal   Collection Time: 11/22/22 11:08 AM   Specimen:  Urine  Result Value Ref Range Status   MICRO NUMBER: [id number]  Final   SPECIMEN QUALITY: Adequate  Final   Sample Source NOT GIVEN  Final   STATUS: FINAL  Final   ISOLATE 1: Klebsiella pneumoniae (A)  Final    Comment: Greater than 100,000 CFU/mL of Klebsiella pneumoniae      Susceptibility   Klebsiella pneumoniae - URINE CULTURE, REFLEX    AMOX/CLAVULANIC <=2 Sensitive     AMPICILLIN 16 Resistant     AMPICILLIN/SULBACTAM <=2 Sensitive     CEFAZOLIN * <=4 Not Reportable      * For infections other than uncomplicated UTI caused by E. coli, K. pneumoniae or P. mirabilis: Cefazolin  is resistant if MIC > or = 8 mcg/mL. (Distinguishing susceptible versus intermediate for isolates with MIC < or = 4 mcg/mL requires additional testing.) For uncomplicated UTI caused by E. coli, K. pneumoniae or P. mirabilis: Cefazolin  is susceptible if MIC <32 mcg/mL and predicts susceptible to the oral agents cefaclor, cefdinir , cefpodoxime, cefprozil, cefuroxime, cephalexin and loracarbef.     CEFTAZIDIME <=1 Sensitive  CEFEPIME <=1 Sensitive     CEFTRIAXONE  <=1 Sensitive     CIPROFLOXACIN  <=0.25 Sensitive     LEVOFLOXACIN  <=0.12 Sensitive     GENTAMICIN <=1 Sensitive     IMIPENEM <=0.25 Sensitive     NITROFURANTOIN 64 Intermediate     PIP/TAZO <=4 Sensitive     TOBRAMYCIN <=1 Sensitive     TRIMETH/SULFA* <=20 Sensitive      * For infections other than uncomplicated UTI caused by E. coli, K. pneumoniae or P. mirabilis: Cefazolin  is resistant if MIC > or = 8 mcg/mL. (Distinguishing susceptible versus intermediate for isolates with MIC < or = 4 mcg/mL requires additional testing.) For uncomplicated UTI caused by E. coli, K. pneumoniae or P. mirabilis: Cefazolin  is susceptible if MIC <32 mcg/mL and predicts susceptible to the oral agents cefaclor, cefdinir , cefpodoxime, cefprozil, cefuroxime, cephalexin and loracarbef. Legend: S = Susceptible  I = Intermediate R = Resistant  NS = Not  susceptible * = Not tested  NR = Not reported **NN = See antimicrobic comments     Labs: CBC: Recent Labs  Lab 01/10/24 1226 01/10/24 1231  WBC 6.1  --   NEUTROABS 3.9  --   HGB 14.3 14.6  HCT 42.7 43.0  MCV 87.0  --   PLT 290  --    Basic Metabolic Panel: Recent Labs  Lab 01/10/24 1226 01/10/24 1231  NA 139 139  K 3.9 3.7  CL 105 104  CO2 25  --   GLUCOSE 110* 106*  BUN 15 16  CREATININE 0.73 0.80  CALCIUM  9.6  --    Liver Function Tests: Recent Labs  Lab 01/10/24 1226  AST 30  ALT 18  ALKPHOS 57  BILITOT 0.3  PROT 6.7  ALBUMIN 3.9   CBG: Recent Labs  Lab 01/10/24 1225  GLUCAP 103*    Discharge time spent: 40 minutes.   Signed: Janayia Burggraf, MD [facility]
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
## 2024-01-15 ENCOUNTER — Encounter: Payer: Self-pay | Admitting: Internal Medicine

## 2024-01-15 ENCOUNTER — Other Ambulatory Visit: Payer: Self-pay | Admitting: Internal Medicine

## 2024-01-15 ENCOUNTER — Ambulatory Visit: Admitting: Internal Medicine

## 2024-01-15 VITALS — BP 130/80 | HR 83 | Ht 62.0 in | Wt 142.0 lb

## 2024-01-15 DIAGNOSIS — Z8659 Personal history of other mental and behavioral disorders: Secondary | ICD-10-CM

## 2024-01-15 DIAGNOSIS — G459 Transient cerebral ischemic attack, unspecified: Secondary | ICD-10-CM

## 2024-01-15 DIAGNOSIS — E785 Hyperlipidemia, unspecified: Secondary | ICD-10-CM | POA: Diagnosis not present

## 2024-01-15 DIAGNOSIS — J683 Other acute and subacute respiratory conditions due to chemicals, gases, fumes and vapors: Secondary | ICD-10-CM

## 2024-01-15 DIAGNOSIS — Z9189 Other specified personal risk factors, not elsewhere classified: Secondary | ICD-10-CM

## 2024-01-15 DIAGNOSIS — F419 Anxiety disorder, unspecified: Secondary | ICD-10-CM

## 2024-01-15 DIAGNOSIS — R7989 Other specified abnormal findings of blood chemistry: Secondary | ICD-10-CM

## 2024-01-15 DIAGNOSIS — E7800 Pure hypercholesterolemia, unspecified: Secondary | ICD-10-CM

## 2024-01-15 DIAGNOSIS — Z8709 Personal history of other diseases of the respiratory system: Secondary | ICD-10-CM

## 2024-01-15 DIAGNOSIS — M810 Age-related osteoporosis without current pathological fracture: Secondary | ICD-10-CM

## 2024-01-15 DIAGNOSIS — Z8249 Family history of ischemic heart disease and other diseases of the circulatory system: Secondary | ICD-10-CM

## 2024-01-15 NOTE — Telephone Encounter (Signed)
 Copied from CRM [id number]. Topic: Clinical - Medication Refill >> Jan 15, 2024 12:25 PM Lizabeth Riggs wrote: Medication: ALPRAZolam  (XANAX ) 0.5 MG tablet  Has the patient contacted their pharmacy? Yes (Agent: If no, request that the patient contact the pharmacy for the refill. If patient does not wish to contact the pharmacy document the reason why and proceed with request.) (Agent: If yes, when and what did the pharmacy advise?) Pharmacy needs order to refill  This is the patient's preferred pharmacy:  [facility] [address] [place] - [address] [place] [address] Phone: [phone] Fax: [phone]  Is this the correct pharmacy for this prescription? Yes If no, delete pharmacy and type the correct one.   Has the prescription been filled recently? No  Is the patient out of the medication? Yes  Has the patient been seen for an appointment in the last year OR does the patient have an upcoming appointment? Yes  Can we respond through MyChart? Yes  Agent: Please be advised that Rx refills may take up to 3 business days. We ask that you follow-up with your pharmacy.

## 2024-01-16 ENCOUNTER — Encounter: Payer: Self-pay | Admitting: Internal Medicine

## 2024-01-16 MED ORDER — ALPRAZOLAM 0.5 MG PO TABS: ORAL_TABLET | ORAL | 3 refills | Status: DC

## 2024-01-16 NOTE — Patient Instructions (Addendum)
 Patient requesting evaluation by Cardiology. Referral has been placed. She has appt with [facility] June 25th. Continue current meds and avoid heavy exercise for now.

## 2024-01-17 ENCOUNTER — Ambulatory Visit: Admitting: Internal Medicine

## 2024-01-17 DIAGNOSIS — M25642 Stiffness of left hand, not elsewhere classified: Secondary | ICD-10-CM | POA: Diagnosis not present

## 2024-01-23 ENCOUNTER — Encounter: Payer: Self-pay | Admitting: Internal Medicine

## 2024-01-25 DIAGNOSIS — M25642 Stiffness of left hand, not elsewhere classified: Secondary | ICD-10-CM | POA: Diagnosis not present

## 2024-01-29 ENCOUNTER — Telehealth: Payer: Self-pay

## 2024-01-29 NOTE — Telephone Encounter (Signed)
 Can we start the Prolia process for this pt. She agreed to this treatment.

## 2024-01-30 ENCOUNTER — Ambulatory Visit (INDEPENDENT_AMBULATORY_CARE_PROVIDER_SITE_OTHER): Admitting: Internal Medicine

## 2024-01-30 VITALS — BP 140/100 | HR 66 | Ht 62.0 in | Wt 143.0 lb

## 2024-01-30 DIAGNOSIS — J4520 Mild intermittent asthma, uncomplicated: Secondary | ICD-10-CM

## 2024-01-30 DIAGNOSIS — G459 Transient cerebral ischemic attack, unspecified: Secondary | ICD-10-CM

## 2024-01-30 DIAGNOSIS — G44209 Tension-type headache, unspecified, not intractable: Secondary | ICD-10-CM

## 2024-01-30 DIAGNOSIS — M810 Age-related osteoporosis without current pathological fracture: Secondary | ICD-10-CM

## 2024-01-30 DIAGNOSIS — E7800 Pure hypercholesterolemia, unspecified: Secondary | ICD-10-CM | POA: Diagnosis not present

## 2024-01-30 DIAGNOSIS — Z8659 Personal history of other mental and behavioral disorders: Secondary | ICD-10-CM

## 2024-01-30 DIAGNOSIS — G4700 Insomnia, unspecified: Secondary | ICD-10-CM

## 2024-01-30 DIAGNOSIS — Z8709 Personal history of other diseases of the respiratory system: Secondary | ICD-10-CM

## 2024-01-30 MED ORDER — HYDROCODONE-ACETAMINOPHEN 5-325 MG PO TABS: 1.0000 | ORAL_TABLET | ORAL | 0 refills | Status: DC | PRN

## 2024-01-30 MED ORDER — HYDROCODONE-ACETAMINOPHEN 5-325 MG PO TABS: 1.0000 | ORAL_TABLET | Freq: Four times a day (QID) | ORAL | 0 refills | Status: AC | PRN

## 2024-01-30 MED ORDER — AMLODIPINE BESYLATE 2.5 MG PO TABS: 2.5000 mg | ORAL_TABLET | Freq: Every day | ORAL | 1 refills | Status: DC

## 2024-01-30 NOTE — Telephone Encounter (Signed)
Prolia VOB initiated via [email or web address]  Next Prolia inj DUE: new start

## 2024-01-30 NOTE — Progress Notes (Signed)
 Patient Care Team: Sylvan Evener, MD as PCP - General (Internal Medicine) Trudy Fusi, DO as Consulting Physician (Allergy )  Visit Date: 01/30/24  Subjective:   Chief Complaint  Patient presents with   Blood Pressure Check    Headaches   Follow-up   Vitals:   01/30/24 1143 01/30/24 1220 01/30/24 1228  BP: (!) 140/90 (!) 140/100 (!) 140/100   Patient ID:Mary Garcia,Female DOB:24-May-1946,78 y.o. EAV:[id number]   78 y.o.Female presents today for acute visit with Blood Pressure Check; Headaches. Patient has a past medical history of Anxiety treated with Xanax  0.5 - 1 mg as needed. Blood pressure today elevated at 140/90, after 37 minutes 140/100, and then 8 minutes later 140/100. Says that since 5/28 when she was seen in ED she has had a headache every day, has been checking her blood pressure which has been 135-145 systolically, 80+ diastolically but notes that her at-home cuff is not calibrated so has been checking at [facility]. Admits that she does feel anxious about this and her recent episode of hypertension that sent her to the ED in the first place. She says that she does tend to be tense in her neck/shoulders and used to get regular massages before her massage therapist retired. Says that she has been walking short distances and yoga for exercise, but nothing exceptionally exertional.     Past Medical History:  Diagnosis Date   Anxiety    Arthritis    Asthma    Bronchitis, acute   No Known Allergies  Family History  Problem Relation Age of Onset   Asthma Mother    Brain cancer Father    Colon cancer Neg Hx    Social History   Social History Narrative   Retired from being a Armed forces operational officer. Former Smoker - quit smoking circa 30 years ago. Minimal social alcohol consumption. Married - husband is a Forensic scientist. 1 Son. Prior to her Left TKA she exercised a lot, still exercises regularly.   Review of Systems  Cardiovascular:        (+) Elevated BP  Neurological:  Positive  for headaches (back of the head).  All other systems reviewed and are negative.    Objective:  Vitals: BP (!) 140/100   Pulse 66   Ht 5' 2 (1.575 m)   Wt 143 lb (64.9 kg)   SpO2 96%   BMI 26.16 kg/m   Physical Exam Vitals and nursing note reviewed.  Constitutional:      General: She is not in acute distress.    Appearance: Normal appearance. She is not toxic-appearing.  HENT:     Head: Normocephalic and atraumatic.   Cardiovascular:     Rate and Rhythm: Normal rate and regular rhythm. No extrasystoles are present.    Pulses: Normal pulses.     Heart sounds: Normal heart sounds. No murmur heard.    No friction rub. No gallop.  Pulmonary:     Effort: Pulmonary effort is normal. No respiratory distress.     Breath sounds: Normal breath sounds. No wheezing or rales.   Musculoskeletal:     Comments: Tenderness on palpation to Right Trapezius muscle, Periscapular muscles, and Right Supraclavicular muscle   Skin:    General: Skin is warm and dry.   Neurological:     Mental Status: She is alert and oriented to person, place, and time. Mental status is at baseline.   Psychiatric:        Mood and Affect: Mood normal.  Behavior: Behavior normal.        Thought Content: Thought content normal.        Judgment: Judgment normal.     Results:  Studies Obtained And Personally Reviewed By Me: Labs:     Component Value Date/Time   NA 139 01/10/2024 1231   K 3.7 01/10/2024 1231   CL 104 01/10/2024 1231   CO2 25 01/10/2024 1226   GLUCOSE 106 (H) 01/10/2024 1231   BUN 16 01/10/2024 1231   CREATININE 0.80 01/10/2024 1231   CREATININE 0.75 11/14/2023 1128   CALCIUM  9.6 01/10/2024 1226   PROT 6.7 01/10/2024 1226   ALBUMIN 3.9 01/10/2024 1226   AST 30 01/10/2024 1226   ALT 18 01/10/2024 1226   ALKPHOS 57 01/10/2024 1226   BILITOT 0.3 01/10/2024 1226   GFRNONAA >60 01/10/2024 1226   GFRNONAA 76 11/03/2020 0932   GFRAA 88 11/03/2020 0932    Lab Results  Component  Value Date   WBC 6.1 01/10/2024   HGB 14.6 01/10/2024   HCT 43.0 01/10/2024   MCV 87.0 01/10/2024   PLT 290 01/10/2024   Lab Results  Component Value Date   CHOL 222 (H) 01/11/2024   HDL 93 01/11/2024   LDLCALC 116 (H) 01/11/2024   TRIG 63 01/11/2024   CHOLHDL 2.4 01/11/2024   Lab Results  Component Value Date   HGBA1C 5.3 01/11/2024    Lab Results  Component Value Date   TSH 1.95 11/14/2023    Assessment & Plan:  Elevated Blood Pressure; Headaches: Blood pressure today elevated at 140/90, after 37 minutes 140/100, and then 8 minutes later 140/100. Has apparently had a headache everyday since being seen in the ED on 5/28, though does note her headache has been occipitally and on exam there is tenderness to many of the surround muscles. Blood pressures outside of the office have been running 135-145/80, though she [facility] pressure as her at-home cuff is not calibrated. Has been lightly exercising, and was instructed not to do any heavy-lifting or excessively exertional. Will send in Norco 5-325 mg #1 tablet to take for headache and Amlodipine 2.5 mg to take daily until she can see Cardiology.   Anxiety treated with Xanax  0.5 - 1 mg as needed. She is quite anxious about her recent event, which may be contributing to her elevated pressure, and has only been taking 0.25 - 0.5 mg of her Xanax . Recommended taking at least 0.5 instead.     I,Emily Lagle,acting as a Neurosurgeon for Sylvan Evener, MD.,have documented all relevant documentation on the behalf of Sylvan Evener, MD,as directed by  Sylvan Evener, MD while in the presence of Sylvan Evener, MD.   ***

## 2024-02-01 ENCOUNTER — Encounter: Payer: Self-pay | Admitting: Internal Medicine

## 2024-02-01 ENCOUNTER — Telehealth: Payer: Self-pay

## 2024-02-01 NOTE — Telephone Encounter (Signed)
 While I was preparing patients chart for PV I noticed that the patient had a stroke on Jan 10 2024. Patient was put on Plavix . Colonoscopy is scheduled for 03/07/24 which is too soon after having a CVA. Procedure needs to be at least 3 months.  Patient will need to be seen in the office prior to scheduling the colonoscopy and will need cardiac clearance. Patient was called but no answer. I will try to call her again later today.   Luanna Rung, LPN ( PV )

## 2024-02-01 NOTE — Patient Instructions (Addendum)
 Patient to be seen at [facility] June 25th for evaluation of recent symptoms and to advise patient further as to future expectations. I suggest she monitor BP regularly and for now do not engage in heavy exercise especially out of doors in the heat. Continue statin medication and amlodipine. Her Medicare wellness visit is planned for April 2026.We are available to see her anytime she has questions or concerns.

## 2024-02-02 NOTE — Telephone Encounter (Signed)
 Noted thanks Await neurology f/u

## 2024-02-02 NOTE — Telephone Encounter (Signed)
 PV and colonoscopy have been cancelled at this time. Mychart message sent making the patient aware. Contact info provided for the patient to call if she has any questions.

## 2024-02-05 NOTE — Telephone Encounter (Signed)
 Medical Buy and Bill  Prior Authorization NOT required for PROLIA  Prolia will be subject to a 20.0% coinsurance up to a $3,150.00 out of pocket max  ($620.00 met). Administration will be covered at 100%. No deductible applies. The benefits provided on this  Verification of Benefits form are Medical Benefits and are the patient's In-Network benefits. If you would like  Pharmacy Benefits, please call [phone].

## 2024-02-05 NOTE — Telephone Encounter (Signed)
 Medical Buy and Zell  Patient is ready for scheduling on or after 02/05/24  Out-of-pocket cost due at time of visit: $331.87  Primary: [employer] [place] Medicare Advantage HMO-POS Prolia co-insurance: 20% (approximately $331.87) Admin fee co-insurance: 0%  Deductible: does not apply  Prior Auth: NOT required  Secondary: N/A Prolia co-insurance:  Admin fee co-insurance:  Deductible:  Prior Auth:  PA# Valid:   ** This summary of benefits is an estimation of the patient's out-of-pocket cost. Exact cost may vary based on individual plan coverage.

## 2024-02-06 ENCOUNTER — Ambulatory Visit

## 2024-02-06 DIAGNOSIS — M810 Age-related osteoporosis without current pathological fracture: Secondary | ICD-10-CM

## 2024-02-06 DIAGNOSIS — M20012 Mallet finger of left finger(s): Secondary | ICD-10-CM | POA: Diagnosis not present

## 2024-02-06 MED ORDER — DENOSUMAB 60 MG/ML ~~LOC~~ SOSY
60.0000 mg | PREFILLED_SYRINGE | Freq: Once | SUBCUTANEOUS | Status: AC
  Administered 2024-08-16: 60 mg via SUBCUTANEOUS

## 2024-02-06 MED ORDER — DENOSUMAB 60 MG/ML ~~LOC~~ SOSY
60.0000 mg | PREFILLED_SYRINGE | Freq: Once | SUBCUTANEOUS | Status: AC
  Administered 2024-02-06: 60 mg via SUBCUTANEOUS

## 2024-02-06 NOTE — Progress Notes (Signed)
 Medication: Prolia Dosage:60 mg Location: Left arm Injection given ab:[id number],MFJ Provider: Lela Trixie COME   *This is her 1st injection so she has been advised to sit in the Lobby for 20 minutes to make sure she is okay and not reactions to the injection *

## 2024-02-07 ENCOUNTER — Encounter: Payer: Self-pay | Admitting: Adult Health

## 2024-02-07 ENCOUNTER — Ambulatory Visit: Admitting: Adult Health

## 2024-02-07 VITALS — BP 119/77 | HR 74 | Ht 62.0 in | Wt 141.6 lb

## 2024-02-07 DIAGNOSIS — G459 Transient cerebral ischemic attack, unspecified: Secondary | ICD-10-CM | POA: Diagnosis not present

## 2024-02-07 DIAGNOSIS — F419 Anxiety disorder, unspecified: Secondary | ICD-10-CM

## 2024-02-07 NOTE — Progress Notes (Signed)
 PATIENT: Mary Garcia DOB: 05/07/1946  REASON FOR VISIT: follow up HISTORY FROM: patient PRIMARY NEUROLOGIST: Dr. Rosemarie  Chief Complaint  Patient presents with   Hospitalization Follow-up    Rm 16, alone.  Seen for confusion/tunnel vision/ edgy/ anxious, elevated bp, seen in ED TIA? Anxiety?    Another slight episode placed on amlodipine.  Having headaches (tension).      HISTORY OF PRESENT ILLNESS: Today 02/07/24    Mary Garcia is a 78 y.o. female here for hospitalization follow-up after possible TIA event. returns today for follow-up.  Overall patient states that she has been doing relatively well.  She has not had any additional events.  She has recently seen her PCP and was having tension type headaches.  She was placed on amlodipine.  She does feel that in the last week that has been beneficial.  Per the hospital note from Dr. Jerri her event seem more related to anxiety and elevated blood pressure and less likely a TIA.  The patient was started on aspirin .  She reports that she is tolerated it well other than  bruising on extremities.  She is now on Crestor  for her cholesterol.  Reports that she is tolerating that well.  Hemoglobin A1c was in normal range.  CTA did not show any high-grade stenosis aneurysm or dissection of the arteries.  Patient reports that in regards to her tension type headaches she notices that when she is in her [facility] she does not have these headaches.  She does feel that since she started amlodipine that has seemed to help some.  She returns today for an evaluation.   MRI brain: IMPRESSION: 1. No acute intracranial abnormality. 2. Mild chronic small vessel ischemic disease.  CTA head/neck: IMPRESSION: No large vessel occlusion.   No high-grade stenosis, aneurysm, or dissection of the arteries in the head and neck.   Mild atherosclerosis as above.  HISTORY Mary Garcia is a 78 y.o. female with history of asthma and eczema admitted for acute onset  dizziness and numbness of the left hand along with hypertension.  Symptoms were too mild to treat with TNK.  Symptoms have resolved, and MRI was negative for stroke.  Patient reports that she has never had hypertension in the past, and TIA is the most likely etiology of her symptoms.  NIH on Admission 1   Most likely related to anxiety and elevated BP, less likely TIA Code Stroke CT head No acute abnormality. ASPECTS 10.    CTA head & neck no LVO or high-grade stenosis MRI no acute abnormality, mild chronic small vessel ischemic disease 2D Echo EF 60 to 65%, grade 1 diastolic dysfunction, normal left atrial size, no atrial level shunt LDL 116 HgbA1c 5.3 VTE prophylaxis -Lovenox  No antithrombotic prior to admission, now on aspirin  81 mg daily at discharge given not able to rule out TIA completely.  Therapy recommendations:  Pending Disposition: pending, likely home    REVIEW OF SYSTEMS: Out of a complete 14 system review of symptoms, the patient complains only of the following symptoms, and all other reviewed systems are negative.   Listed in HPI  ALLERGIES: No Known Allergies  HOME MEDICATIONS: Outpatient Medications Prior to Visit  Medication Sig Dispense Refill   albuterol  (VENTOLIN  HFA) 108 (90 Base) MCG/ACT inhaler Inhale 2 puffs into the lungs every 4 (four) hours as needed for wheezing or shortness of breath (coughing fits). 18 g 1   ALPRAZolam  (XANAX ) 0.5 MG tablet  One half to one tab twice daily as needed for anxiety. 60 tablet 3   amLODipine (NORVASC) 2.5 MG tablet Take 1 tablet (2.5 mg total) by mouth daily. 30 tablet 1   aspirin  EC 81 MG tablet Take 1 tablet (81 mg total) by mouth daily. Swallow whole. 30 tablet 12   azelastine  (ASTELIN ) 0.1 % nasal spray Place 1-2 sprays in each nostril one to two times a day as needed for runny nose/drainage down throat 30 mL 5   BREYNA  80-4.5 MCG/ACT inhaler INHALE 2 PUFFS INTO LUNGS TWICE DAILY RINSE MOUTH AFTER USE 11 g 0   Calcium   Carb-Cholecalciferol (CALCIUM  + VITAMIN D3 PO) Take 1 tablet by mouth at bedtime.     ELDERBERRY PO Take 1 capsule by mouth 2 (two) times daily.     ketoconazole (NIZORAL) 2 % cream Apply 1 application topically 2 (two) times daily as needed (rash).     Multiple Vitamins-Minerals (MULTIVITAMIN WOMEN 50+) TABS Take 1 tablet by mouth at bedtime.     rosuvastatin  (CRESTOR ) 10 MG tablet Take 1 tablet (10 mg total) by mouth daily. 30 tablet 1   tretinoin (RETIN-A) 0.1 % cream Apply 1 application  topically See admin instructions. Apply topically to face at night, 3 day a week.     zolpidem  (AMBIEN ) 10 MG tablet TAKE 1 TABLET BY MOUTH EVERY DAY AT BEDTIME AS NEEDED FOR SLEEP (Patient taking differently: Take 10 mg by mouth at bedtime. Takes 5mg  po at bedtime.) 90 tablet 1   Facility-Administered Medications Prior to Visit  Medication Dose Route Frequency Provider Last Rate Last Admin   [START ON 08/07/2024] denosumab (PROLIA) injection 60 mg  60 mg Subcutaneous Once Gherghe, Cristina, MD        PAST MEDICAL HISTORY: Past Medical History:  Diagnosis Date   Anxiety    Arthritis    Asthma    Bronchitis, acute     PAST SURGICAL HISTORY: Past Surgical History:  Procedure Laterality Date   BREAST ENHANCEMENT SURGERY     CATARACT EXTRACTION W/ INTRAOCULAR LENS IMPLANT Bilateral    FACELIFT     local   ROTATOR CUFF REPAIR Right    TIBIA FRACTURE SURGERY  2004   left leg x3, tibia and fibula   TOTAL KNEE ARTHROPLASTY Left 10/18/2021   Procedure: TOTAL KNEE ARTHROPLASTY;  Surgeon: Rubie Kemps, MD;  Location: [facility];  Service: Orthopedics;  Laterality: Left;   TUMOR EXCISION  2003   right muscle back, benign    FAMILY HISTORY: Family History  Problem Relation Age of Onset   Asthma Mother    Brain cancer Father    Colon cancer Neg Hx     SOCIAL HISTORY: Social History   Socioeconomic History   Marital status: Married    Spouse name: Not on file   Number of children: 1   Years of  education: Not on file   Highest education level: Bachelor's degree (e.g., BA, AB, BS)  Occupational History   Occupation: retired  Tobacco Use   Smoking status: Former    Current packs/day: 0.00    Average packs/day: 0.4 packs/day for 15.0 years (6.0 ttl pk-yrs)    Types: Cigarettes    Start date: 08/16/1963    Quit date: 08/15/1978    Years since quitting: 45.5   Smokeless tobacco: Never   Tobacco comments:    smokes 5 cigs daily when smoking  Vaping Use   Vaping status: Never Used  Substance and Sexual Activity  Alcohol use: Yes    Alcohol/week: 2.0 standard drinks of alcohol    Types: 2 Glasses of wine per week    Comment: 2-3 times a week   Drug use: No   Sexual activity: Not on file  Other Topics Concern   Not on file  Social History Narrative   Retired from being a Armed forces operational officer. Former Smoker - quit smoking circa 30 years ago. Minimal social alcohol consumption. Married - husband is a Forensic scientist. 1 Son. Prior to her Left TKA she exercised a lot, still exercises regularly.   Caffiene: 1 expresso latte in am, now english breakfast tea   Lives with husband, pets 1 dog   Social Drivers of Corporate investment banker Strain: Low Risk  (01/30/2024)   Overall Financial Resource Strain (CARDIA)    Difficulty of Paying Living Expenses: Not hard at all  Food Insecurity: No Food Insecurity (01/30/2024)   Hunger Vital Sign    Worried About Running Out of Food in the Last Year: Never true    Ran Out of Food in the Last Year: Never true  Transportation Needs: No Transportation Needs (01/30/2024)   PRAPARE - Administrator, Civil Service (Medical): No    Lack of Transportation (Non-Medical): No  Physical Activity: Insufficiently Active (01/30/2024)   Exercise Vital Sign    Days of Exercise per Week: 3 days    Minutes of Exercise per Session: 30 min  Stress: No Stress Concern Present (01/30/2024)   [employer] of Occupational Health - Occupational Stress  Questionnaire    Feeling of Stress: Only a little  Social Connections: Moderately Isolated (01/30/2024)   Social Connection and Isolation Panel    Frequency of Communication with Friends and Family: More than three times a week    Frequency of Social Gatherings with Friends and Family: Three times a week    Attends Religious Services: Never    Active Member of Clubs or Organizations: No    Attends Banker Meetings: Not on file    Marital Status: Married  Intimate Partner Violence: Not At Risk (01/11/2024)   Humiliation, Afraid, Rape, and Kick questionnaire    Fear of Current or Ex-Partner: No    Emotionally Abused: No    Physically Abused: No    Sexually Abused: No      PHYSICAL EXAM  Vitals:   02/07/24 0923  BP: 119/77  Pulse: 74  SpO2: 98%  Weight: 141 lb 9.6 oz (64.2 kg)  Height: 5' 2 (1.575 m)   Body mass index is 25.9 kg/m.  Generalized: Well developed, in no acute distress   Neurological examination  Mentation: Alert oriented to time, place, history taking. Follows all commands speech and language fluent Cranial nerve II-XII: Pupils were equal round reactive to light. Extraocular movements were full, visual field were full on confrontational test. Facial sensation and strength were normal. Uvula tongue midline. Head turning and shoulder shrug  were normal and symmetric. Motor: The motor testing reveals 5 over 5 strength of all 4 extremities. Good symmetric motor tone is noted throughout.  Sensory: Sensory testing is intact to soft touch on all 4 extremities. No evidence of extinction is noted.  Coordination: Cerebellar testing reveals good finger-nose-finger and heel-to-shin bilaterally.  Gait and station: Gait is normal.  Reflexes: Deep tendon reflexes are symmetric and normal bilaterally.   DIAGNOSTIC DATA (LABS, IMAGING, TESTING) - I reviewed patient records, labs, notes, testing and imaging myself where available.  Lab Results  Component Value  Date   WBC 6.1 01/10/2024   HGB 14.6 01/10/2024   HCT 43.0 01/10/2024   MCV 87.0 01/10/2024   PLT 290 01/10/2024      Component Value Date/Time   NA 139 01/10/2024 1231   K 3.7 01/10/2024 1231   CL 104 01/10/2024 1231   CO2 25 01/10/2024 1226   GLUCOSE 106 (H) 01/10/2024 1231   BUN 16 01/10/2024 1231   CREATININE 0.80 01/10/2024 1231   CREATININE 0.75 11/14/2023 1128   CALCIUM  9.6 01/10/2024 1226   PROT 6.7 01/10/2024 1226   ALBUMIN 3.9 01/10/2024 1226   AST 30 01/10/2024 1226   ALT 18 01/10/2024 1226   ALKPHOS 57 01/10/2024 1226   BILITOT 0.3 01/10/2024 1226   GFRNONAA >60 01/10/2024 1226   GFRNONAA 76 11/03/2020 0932   GFRAA 88 11/03/2020 0932   Lab Results  Component Value Date   CHOL 222 (H) 01/11/2024   HDL 93 01/11/2024   LDLCALC 116 (H) 01/11/2024   TRIG 63 01/11/2024   CHOLHDL 2.4 01/11/2024   Lab Results  Component Value Date   HGBA1C 5.3 01/11/2024   No results found for: VITAMINB12 Lab Results  Component Value Date   TSH 1.95 11/14/2023      ASSESSMENT AND PLAN 78 y.o. year old female  has a past medical history of Anxiety, Arthritis, Asthma, and Bronchitis, acute. here with:  Tia event/ anxiety/hypertension     Continue aspirin  81 mg daily  for secondary stroke prevention.  Discussed secondary stroke prevention measures and importance of close PCP follow up for aggressive stroke risk factor management. I have gone over the pathophysiology of stroke, warning signs and symptoms, risk factors and their management in some detail with instructions to go to the closest emergency room for symptoms of concern. HTN: BP goal <130/90.   HLD: LDL goal <70. Recent LDL 116.  DMII: A1c goal<7.0. Recent A1c 5.3.  Encouraged patient to monitor diet and encouraged exercise Did advise that if tension headaches continue we could consider preventative therapy FU with our office 6-8 months or sooner if needed   Duwaine Russell, MSN, NP-C 02/07/2024, 12:12  PM [facility] [address] [place], [place] [address] [phone]

## 2024-02-07 NOTE — Patient Instructions (Signed)
 Mary Garcia

## 2024-02-08 ENCOUNTER — Telehealth: Payer: Self-pay | Admitting: Internal Medicine

## 2024-02-08 ENCOUNTER — Telehealth: Payer: Self-pay | Admitting: *Deleted

## 2024-02-08 NOTE — Telephone Encounter (Signed)
-----   Message from Duwaine Russell sent at 02/08/2024  8:09 AM EDT ----- Please let patient know I dicussed with Dr. Rosemarie. ASA is still recommended- nothing better that wouldn't have potential for bruising. ----- Message ----- From: Rosemarie Eather RAMAN, MD Sent: 02/07/2024   5:47 PM EDT To: Duwaine Russell, NP  Nothing is better all have same risk ----- Message ----- From: Russell Duwaine, NP Sent: 02/07/2024   1:27 PM EDT To: Eather RAMAN Rosemarie, MD  Patient reports bruising on extremities with aspirin .  I know this can be an unfortunate side effect.  You have recommended trying anything else? Or continue with ASA

## 2024-02-08 NOTE — Telephone Encounter (Signed)
 Copied from CRM [id number]. Topic: Clinical - Medication Refill >> Feb 08, 2024  3:02 PM Emylou G wrote: Medication: rosuvastatin  (CRESTOR ) 10 MG tablet  Has the patient contacted their pharmacy? Yes (Agent: If no, request that the patient contact the pharmacy for the refill. If patient does not wish to contact the pharmacy document the reason why and proceed with request.) (Agent: If yes, when and what did the pharmacy advise?) said to call us   This is the patient's preferred pharmacy:  [facility] [address] [place] - [address] [place] [address] Phone: [phone] Fax: [phone]  Is this the correct pharmacy for this prescription? Yes If no, delete pharmacy and type the correct one.   Has the prescription been filled recently? NO   Is the patient out of the medication? Yes  Has the patient been seen for an appointment in the last year OR does the patient have an upcoming appointment? Yes  Can we respond through MyChart? No  Agent: Please be advised that Rx refills may take up to 3 business days. We ask that you follow-up with your pharmacy.

## 2024-02-08 NOTE — Telephone Encounter (Signed)
 I called pt and LMVM for her message as well as sending in [employer].  She is to call back if questions.

## 2024-02-14 ENCOUNTER — Other Ambulatory Visit: Payer: Self-pay | Admitting: Internal Medicine

## 2024-02-14 NOTE — Telephone Encounter (Unsigned)
 Copied from CRM [id number]. Topic: Clinical - Medication Refill >> Feb 14, 2024 11:47 AM Tonda B wrote: Medication: rosuvastatin  (CRESTOR ) 10 MG tablet  Has the patient contacted their pharmacy? Yes (Agent: If no, request that the patient contact the pharmacy for the refill. If patient does not wish to contact the pharmacy document the reason why and proceed with request.) (Agent: If yes, when and what did the pharmacy advise?)  This is the patient's preferred pharmacy:  [facility] [address] [place] - [address] [place] [address] Phone: [phone] Fax: [phone]  Is this the correct pharmacy for this prescription? Yes If no, delete pharmacy and type the correct one.   Has the prescription been filled recently? Yes  Is the patient out of the medication? Yes  Has the patient been seen for an appointment in the last year OR does the patient have an upcoming appointment? Yes  Can we respond through MyChart? No  Agent: Please be advised that Rx refills may take up to 3 business days. We ask that you follow-up with your pharmacy.

## 2024-02-20 ENCOUNTER — Encounter

## 2024-02-20 NOTE — Telephone Encounter (Signed)
 Last Prolia inj 02/06/24 Next Prolia inj due 08/08/24

## 2024-02-21 ENCOUNTER — Other Ambulatory Visit: Payer: Self-pay

## 2024-02-21 MED ORDER — ROSUVASTATIN CALCIUM 10 MG PO TABS: 10.0000 mg | ORAL_TABLET | Freq: Every day | ORAL | 1 refills | Status: DC

## 2024-02-21 NOTE — Progress Notes (Signed)
 Mary Garcia is booked for labs and OV in September. She said that the neurologist put her on Rosuvastatin  10mg  and she would like a refill yet insurance will only cover if it comes from her PCP. She is out and has been using her husbands.

## 2024-03-04 ENCOUNTER — Encounter: Payer: Self-pay | Admitting: Internal Medicine

## 2024-03-07 ENCOUNTER — Encounter: Admitting: Internal Medicine

## 2024-03-18 ENCOUNTER — Other Ambulatory Visit: Payer: Self-pay | Admitting: Internal Medicine

## 2024-03-20 ENCOUNTER — Other Ambulatory Visit: Payer: Self-pay

## 2024-03-20 MED ORDER — ROSUVASTATIN CALCIUM 10 MG PO TABS: 10.0000 mg | ORAL_TABLET | Freq: Every day | ORAL | 3 refills | Status: AC

## 2024-03-22 ENCOUNTER — Telehealth: Payer: Self-pay | Admitting: *Deleted

## 2024-03-22 NOTE — Telephone Encounter (Signed)
 Dr Avram- Pt has had f/u with Neurology, on baby ASA only. May we proceed or does she need OV?  (See TE 02/01/24)

## 2024-03-22 NOTE — Telephone Encounter (Signed)
 Based upon the timing of possible TIA and her appointment date I think she is ok to have colonoscopy in September  Have cced Norleen Schillings

## 2024-04-09 ENCOUNTER — Encounter

## 2024-04-16 ENCOUNTER — Ambulatory Visit (INDEPENDENT_AMBULATORY_CARE_PROVIDER_SITE_OTHER): Admitting: Internal Medicine

## 2024-04-16 ENCOUNTER — Telehealth: Payer: Self-pay

## 2024-04-16 ENCOUNTER — Other Ambulatory Visit: Payer: Self-pay | Admitting: Internal Medicine

## 2024-04-16 ENCOUNTER — Encounter: Payer: Self-pay | Admitting: Internal Medicine

## 2024-04-16 ENCOUNTER — Ambulatory Visit
Admission: RE | Admit: 2024-04-16 | Discharge: 2024-04-16 | Disposition: A | Discharge status: Home / Self Care | Source: Ambulatory Visit | Attending: Internal Medicine | Admitting: Internal Medicine

## 2024-04-16 ENCOUNTER — Telehealth: Payer: Self-pay | Admitting: Internal Medicine

## 2024-04-16 ENCOUNTER — Ambulatory Visit: Payer: Self-pay | Admitting: Internal Medicine

## 2024-04-16 VITALS — BP 110/80 | HR 112 | Temp 98.6°F | Ht 62.0 in | Wt 141.0 lb

## 2024-04-16 DIAGNOSIS — Z8659 Personal history of other mental and behavioral disorders: Secondary | ICD-10-CM | POA: Diagnosis not present

## 2024-04-16 DIAGNOSIS — J683 Other acute and subacute respiratory conditions due to chemicals, gases, fumes and vapors: Secondary | ICD-10-CM

## 2024-04-16 DIAGNOSIS — R062 Wheezing: Secondary | ICD-10-CM | POA: Diagnosis not present

## 2024-04-16 DIAGNOSIS — R059 Cough, unspecified: Secondary | ICD-10-CM

## 2024-04-16 DIAGNOSIS — R0989 Other specified symptoms and signs involving the circulatory and respiratory systems: Secondary | ICD-10-CM

## 2024-04-16 DIAGNOSIS — E7800 Pure hypercholesterolemia, unspecified: Secondary | ICD-10-CM

## 2024-04-16 DIAGNOSIS — J189 Pneumonia, unspecified organism: Secondary | ICD-10-CM

## 2024-04-16 DIAGNOSIS — Z8673 Personal history of transient ischemic attack (TIA), and cerebral infarction without residual deficits: Secondary | ICD-10-CM

## 2024-04-16 LAB — POC COVID19/FLU A&B COMBO
Covid Antigen, POC: NEGATIVE
Influenza A Antigen, POC: NEGATIVE
Influenza B Antigen, POC: NEGATIVE

## 2024-04-16 MED ORDER — HYDROCODONE BIT-HOMATROP MBR 5-1.5 MG/5ML PO SOLN: 5.0000 mL | Freq: Three times a day (TID) | ORAL | 0 refills | Status: DC | PRN

## 2024-04-16 MED ORDER — METHYLPREDNISOLONE 4 MG PO TABS
ORAL_TABLET | ORAL | 0 refills | Status: DC
Start: 2024-04-16 — End: 2024-04-23

## 2024-04-16 MED ORDER — CEFTRIAXONE SODIUM 1 G IJ SOLR
1.0000 g | Freq: Once | INTRAMUSCULAR | Status: AC
  Administered 2024-04-16: 1 g via INTRAMUSCULAR

## 2024-04-16 NOTE — Telephone Encounter (Signed)
 Patient called, [facility] is out of the hycodan, she would like to come pick up a hard copy so she can take it to a pharmacy that has it.

## 2024-04-16 NOTE — Progress Notes (Signed)
 Patient Care Team: Perri Ronal PARAS, MD as PCP - General (Internal Medicine) Luke Orlan HERO, DO as Consulting Physician (Allergy )  Visit Date: 04/16/24  Subjective:   Chief Complaint  Patient presents with   Cough   Patient ID:Mary Garcia,Female DOB:1946-06-19,78 y.o. FMW:[id number]   78 y.o.Female presents today for acute sick visit with Cough. Patient has a past medical history of Asthma; Bronchitis. She says that she began to feel ill Saturday with a productive cough with mostly clear sputum w/ some discoloration. A Nurse Practitioner prescribed a Zithromax  Z pak for her. Has not had Covid-19 vaccine recently. Denies travel within the past week, but this Thursday, 04/18/24, she will be going to [place]  and be be gone for a few days.  Was hospitalized in late May with ? TIA. Stroke was ruled out. Was thought to have suffered a TIA per discharge summary. She is on ASA and Crestor . No further symptoms.   Discussed her Neurology visit 02/07/2024 after she was seen in this office 6/17 for a TIA.  Past Medical History:  Diagnosis Date   Anxiety    Arthritis    Asthma    Bronchitis, acute   No Known Allergies Immunization History  Administered Date(s) Administered   INFLUENZA, HIGH DOSE SEASONAL PF 05/29/2017   Influenza Inj Mdck Quad Pf 05/16/2023   Influenza,inj,Quad PF,6+ Mos 04/23/2013, 07/03/2014, 04/27/2015, 04/13/2016, 06/16/2018, 05/13/2019, 05/08/2020, 06/02/2021   Influenza-Unspecified 06/05/2018   PFIZER(Purple Top)SARS-COV-2 Vaccination 09/03/2019, 09/23/2019, 04/13/2020, 12/03/2020   Pfizer Covid-19 Vaccine Bivalent Booster 79yrs & up 04/24/2023, 05/16/2023   Pneumococcal Conjugate-13 10/02/2015   Pneumococcal Polysaccharide-23 11/06/2018   Tdap 12/28/2002, 04/23/2013   Unspecified SARS-COV-2 Vaccination 05/24/2022   Zoster Recombinant(Shingrix) 07/27/2022, 11/21/2022   Zoster, Live 01/27/2010   Past Surgical History:  Procedure Laterality Date   BREAST ENHANCEMENT  SURGERY     CATARACT EXTRACTION W/ INTRAOCULAR LENS IMPLANT Bilateral    FACELIFT     local   ROTATOR CUFF REPAIR Right    TIBIA FRACTURE SURGERY  2004   left leg x3, tibia and fibula   TOTAL KNEE ARTHROPLASTY Left 10/18/2021   Procedure: TOTAL KNEE ARTHROPLASTY;  Surgeon: Rubie Kemps, MD;  Location: [facility];  Service: Orthopedics;  Laterality: Left;   TUMOR EXCISION  2003   right muscle back, benign    Family History  Problem Relation Age of Onset   Asthma Mother    Brain cancer Father    Colon cancer Neg Hx    Social History   Social History Narrative   Retired from being a Armed forces operational officer. Former Smoker - quit smoking circa 30 years ago. Minimal social alcohol consumption. Married - husband is a Forensic scientist. 1 Son. Prior to her Left TKA she exercised a lot, still exercises regularly.   Caffiene: 1 expresso latte in am, now english breakfast tea   Lives with husband, pets 1 dog   Review of Systems  Constitutional:  Negative for fever and malaise/fatigue.  HENT:  Negative for congestion.   Eyes:  Negative for blurred vision.  Respiratory:  Positive for cough, sputum production and wheezing. Negative for shortness of breath.   Cardiovascular:  Negative for chest pain, palpitations and leg swelling.  Gastrointestinal:  Negative for vomiting.  Musculoskeletal:  Negative for back pain.  Skin:  Negative for rash.  Neurological:  Negative for loss of consciousness and headaches.     Objective:  Vitals: BP 110/80   Pulse (!) 112   Temp 98.6 F (37  C)   SpO2 98%   Physical Exam Vitals and nursing note reviewed.  Constitutional:      General: She is not in acute distress.    Appearance: Normal appearance. She is not ill-appearing or toxic-appearing.  HENT:     Head: Normocephalic and atraumatic.     Right Ear: Ear canal and external ear normal. Tympanic membrane is erythematous (pink).     Left Ear: Ear canal and external ear normal. Tympanic membrane is injected and  erythematous (pink).     Ears:     Comments: Right TM: dull    Mouth/Throat:     Mouth: Mucous membranes are moist.     Pharynx: Posterior oropharyngeal erythema present. No oropharyngeal exudate.  Pulmonary:     Effort: Pulmonary effort is normal.     Breath sounds: Examination of the left-lower field reveals rales. Wheezing and rales present. No rhonchi.  Lymphadenopathy:     Cervical: No cervical adenopathy.  Skin:    General: Skin is warm and dry.  Neurological:     Mental Status: She is alert and oriented to person, place, and time. Mental status is at baseline.  Psychiatric:        Mood and Affect: Mood normal.        Behavior: Behavior normal.        Thought Content: Thought content normal.        Judgment: Judgment normal.     Results:  Studies Obtained And Personally Reviewed By Me: Labs:  CBC w/ Differential Lab Results  Component Value Date   WBC 6.1 01/10/2024   RBC 4.91 01/10/2024   HGB 14.6 01/10/2024   HCT 43.0 01/10/2024   PLT 290 01/10/2024   MCV 87.0 01/10/2024   MCH 29.1 01/10/2024   MCHC 33.5 01/10/2024   RDW 14.1 01/10/2024   MPV 10.5 11/14/2023   LYMPHSABS 1.6 01/10/2024   MONOABS 0.5 01/10/2024   BASOSABS 0.0 01/10/2024    Comprehensive Metabolic Panel Lab Results  Component Value Date   NA 139 01/10/2024   K 3.7 01/10/2024   CL 104 01/10/2024   CO2 25 01/10/2024   GLUCOSE 106 (H) 01/10/2024   BUN 16 01/10/2024   CREATININE 0.80 01/10/2024   CALCIUM  9.6 01/10/2024   PROT 6.7 01/10/2024   ALBUMIN 3.9 01/10/2024   AST 30 01/10/2024   ALT 18 01/10/2024   ALKPHOS 57 01/10/2024   BILITOT 0.3 01/10/2024   EGFR 76 11/21/2022   GFRNONAA >60 01/10/2024   Lipid Panel  Lab Results  Component Value Date   CHOL 222 (H) 01/11/2024   HDL 93 01/11/2024   LDLCALC 116 (H) 01/11/2024   TRIG 63 01/11/2024   A1c Lab Results  Component Value Date   HGBA1C 5.3 01/11/2024    TSH Lab Results  Component Value Date   TSH 1.95 11/14/2023    Assessment & Plan:   Orders Placed This Encounter  Procedures   DG Chest 2 View    Reason for Exam (SYMPTOM  OR DIAGNOSIS REQUIRED):   Left loer lobe rales with congested cough    Preferred imaging location?:   [facility]    Release to patient:   Immediate [1]   Meds ordered this encounter  Medications   HYDROcodone  bit-homatropine (HYCODAN) 5-1.5 MG/5ML syrup    Sig: Take 5 mLs by mouth every 8 (eight) hours as needed for cough.    Dispense:  120 mL    Refill:  0   methylPREDNISolone  (MEDROL ) 4  MG tablet    Sig: Take in tapering course as directed 6-5-4-3-2-1    Dispense:  21 tablet    Refill:  0    Left lower lobe Chest Rales- Suspect Pneumonia Left Lower Lobe Of Lung: symptoms began Saturday with a productive cough expectorating mostly clear sputum w/ some discoloration. She was seen by a nurse practitioner, who prescribed her a Zpak that she has since completed. She is not vaccinated against Covid-19 currently. Denies travel within the past week, but this Thursday, 9/04, she will be going to [place] . On auscultation I hear rales in left lower lobe, concerning for Pneumonia. Has been taking Zithromax  Z-pak so I will not prescribe any PO antibiotics today. For her cough, I am sending in Medrol  4 mg  tablets to take in tapering course 6-5-4-3-2-1 and alo Hycodan syrup - take 1 teaspoon every 8 hours as needed for cough. Given Rocephin  1 g IM today in-office. Ordering CXR to r/o Pneumonia. Stay well rested, well hydrated, and well nourished. Walk around some to prevent atelectasis. Contact us  if symptoms worsen/persist despite treatment.   Mild HTN- placed on low dose amlodipine  in mid-June. BP excellent today at 110/80. Pulse is 112 today but pt is concerned about upcoming trip to [place]  with current symptoms. In office test today negative for Covid-19.CXR is pending.  Discussed her Neurology visit with Duwaine Russell, NP on 02/07/2024. Has been doing well since hospitalization  and no further episodes or symptoms. Suggest she continue ASA and rosuvastatin . MRI of brain without contrast  in May showed no acute infarct, no tumor, or CNS bleed.I still think this could have been a TIA but there is no definitive test to prove this. Has high HDL and prior to being placed on statin, had mildly elevated LDL.  Addendum: CXR is negative for pneumonia and Covid test here in the office is negative. Rest and stay well-hydrated. Certainly has an acute lower respiratory infection.   I,Emily Lagle,acting as a Neurosurgeon for Ronal JINNY Hailstone, MD.,have documented all relevant documentation on the behalf of Ronal JINNY Hailstone, MD,as directed by  Ronal JINNY Hailstone, MD while in the presence of Ronal JINNY Hailstone, MD.  I, Ronal JINNY Hailstone, MD, have reviewed all documentation for this visit. The documentation on 04/16/2024 for the exam, diagnosis, procedures, and orders are all accurate and complete.

## 2024-04-16 NOTE — Telephone Encounter (Signed)
 Copied from CRM [id number]. Topic: Clinical - Prescription Issue >> Apr 16, 2024  2:26 PM Tiffini S wrote: Reason for CRM: Patient called stating that the pharmacy do not have the medication HYDROcodone  bit-homatropine (HYCODAN) 5-1.5 MG/5ML syrup is stock/ the patient spouse Wadie will stop by the clinic to pick up a paper copy of the prescription Called [facility], spoke with Araceli- will talk with Dr. Perri and call the patient back today

## 2024-04-16 NOTE — Telephone Encounter (Signed)
 [facility] does not have prescription. Patient says [facility] has Hycodan in stock so have sent in Rx there 120 cc one tsp by mouth every 8 hours as needed for cough. MJB, MD

## 2024-04-16 NOTE — Telephone Encounter (Signed)
 She said [facility] has it.

## 2024-04-16 NOTE — Patient Instructions (Signed)
 CXR is negative for pneumonia. I heard rales in left lower lobe. You have an acute lower respiratory infection. Given one gram IM Rocephin  in office and a 6 day Mdrol 4 mg dosepack. Finish Z-pak as prescribed. Rest and stay well hydrated.

## 2024-04-22 ENCOUNTER — Other Ambulatory Visit: Payer: Self-pay | Admitting: Allergy

## 2024-04-23 ENCOUNTER — Other Ambulatory Visit

## 2024-04-23 ENCOUNTER — Ambulatory Visit (INDEPENDENT_AMBULATORY_CARE_PROVIDER_SITE_OTHER): Admitting: Allergy

## 2024-04-23 ENCOUNTER — Other Ambulatory Visit: Payer: Self-pay

## 2024-04-23 ENCOUNTER — Encounter: Payer: Self-pay | Admitting: Allergy

## 2024-04-23 ENCOUNTER — Encounter: Admitting: Internal Medicine

## 2024-04-23 VITALS — BP 126/72 | HR 72 | Temp 98.0°F | Resp 20 | Wt 141.1 lb

## 2024-04-23 DIAGNOSIS — J301 Allergic rhinitis due to pollen: Secondary | ICD-10-CM

## 2024-04-23 DIAGNOSIS — J4530 Mild persistent asthma, uncomplicated: Secondary | ICD-10-CM

## 2024-04-23 DIAGNOSIS — E7800 Pure hypercholesterolemia, unspecified: Secondary | ICD-10-CM

## 2024-04-23 MED ORDER — BUDESONIDE-FORMOTEROL FUMARATE 80-4.5 MCG/ACT IN AERO: 2.0000 | INHALATION_SPRAY | Freq: Two times a day (BID) | RESPIRATORY_TRACT | 5 refills | Status: AC

## 2024-04-23 MED ORDER — ALBUTEROL SULFATE HFA 108 (90 BASE) MCG/ACT IN AERS: 2.0000 | INHALATION_SPRAY | RESPIRATORY_TRACT | 1 refills | Status: AC | PRN

## 2024-04-23 NOTE — Patient Instructions (Addendum)
 Mild persistent asthma Today's breathing test was normal which is great. If you notice that the coughing doesn't improve then let me know - I may send Breyna  160mcg inhaler.  Daily controller medication(s): Breyna  80mcg 2 puffs twice a day with spacer and rinse mouth afterwards. Take 2 puffs twice a day from middle of August and the colder months.  Will decrease to 2 puffs once a day during the warmer months.  May use albuterol  rescue inhaler 2 puffs every 4 to 6 hours as needed for shortness of breath, chest tightness, coughing, and wheezing. May use albuterol  rescue inhaler 2 puffs 5 to 15 minutes prior to strenuous physical activities. Monitor frequency of use - if you need to use it more than twice per week on a consistent basis let us  know.  Breathing control goals:  Full participation in all desired activities (may need albuterol  before activity) Albuterol  use two times or less a week on average (not counting use with activity) Cough interfering with sleep two times or less a month Oral steroids no more than once a year No hospitalizations    Allergic rhinitis 2020 skin testing borderline positive to tree pollen.  Continue environmental control measures.  May use azelastine  nasal spray 1-2 sprays per nostril 1-2 times a day as needed for runny nose. May use over the counter antihistamines such as Zyrtec (cetirizine), Claritin (loratadine), Allegra (fexofenadine), or Xyzal (levocetirizine) daily as needed. May take twice a day during flares.   Follow up in 4 months or sooner if needed. Recommend flu shot in the fall.   Reducing Pollen Exposure Pollen seasons: trees (spring), grass (summer) and ragweed/weeds (fall). Keep windows closed in your home and car to lower pollen exposure.  Install air conditioning in the bedroom and throughout the house if possible.  Avoid going out in dry windy days - especially early morning. Pollen counts are highest between 5 - 10 AM and on dry, hot and  windy days.  Save outside activities for late afternoon or after a heavy rain, when pollen levels are lower.  Avoid mowing of grass if you have grass pollen allergy . Be aware that pollen can also be transported indoors on people and pets.  Dry your clothes in an automatic dryer rather than hanging them outside where they might collect pollen.  Rinse hair and eyes before bedtime.

## 2024-04-23 NOTE — Progress Notes (Signed)
 Follow Up Note  RE: Mary Garcia MRN: [id number] DOB: 1945-11-15 Date of Office Visit: 04/23/2024  Referring provider: Perri Ronal PARAS, MD Primary care provider: Perri Ronal PARAS, MD  Chief Complaint: Follow-up (Medication refills. Had z-pack, prednisone  injection and pills back from [place] some coughing)  History of Present Illness: I had the pleasure of seeing Mary Garcia for a follow up visit at the [facility] on 04/23/2024. She is a 78 y.o. female, who is being followed for asthma, allergic rhinitis. Her previous allergy  office visit was on 01/10/2024 with Dr. Luke. Today is a regular follow up visit.  Discussed the use of AI scribe software for clinical note transcription with the patient, who gave verbal consent to proceed.     She experienced a significant respiratory illness over the Labor Day weekend, characterized by drainage, sore throat, congestion, and a persistent cough. Initially, she increased her Breyna  inhaler to two puffs twice a day, but her symptoms worsened. Unable to see her regular healthcare providers, she contacted her daughter-in-law, a Publishing rights manager, who prescribed a Z-Pak. Despite this, she continued to feel unwell and sought further treatment, receiving a steroid shot and prednisone , which improved her condition but did not fully resolve her symptoms. She continues to experience a lingering cough and congestion, though the phlegm is clear.   She has been using Breyna  80mcg two puffs twice a day and has recently run out. She also used her rescue inhaler, albuterol , frequently during her trip to [facility], but has since weaned off it. No recent use of allergy  medications or nasal sprays. No fever or chills. She reports that her recent chest x-ray was negative.   Prior to this, she hasn't had a flare requiring steroids in quite some time.  Her past medical history includes a previous episode where she was sent to the ER with high blood pressure and  suspected TIA, which was later considered to be an anxiety attack or possibly a cluster migraine. She is currently on a low dose of amlodipine  and Crestor .   She maintains an active lifestyle, engaging in activities such as yoga, Pilates, hiking, and walking. Her recent COVID and flu tests were negative.      04/16/2024 PCP visit: 78 y.o.Female presents today for acute sick visit with Cough. Patient has a past medical history of Asthma; Bronchitis. She says that she began to feel ill Saturday with a productive cough with mostly clear sputum w/ some discoloration. A Nurse Practitioner prescribed a Zithromax  Z pak for her. Has not had Covid-19 vaccine recently. Denies travel within the past week, but this Thursday, 04/18/24, she will be going to [place]  and be be gone for a few days.   Was hospitalized in late May with ? TIA. Stroke was ruled out. Was thought to have suffered a TIA per discharge summary. She is on ASA and Crestor . No further symptoms.   Discussed her Neurology visit 02/07/2024 after she was seen in this office 6/17 for a TIA.   Assessment and Plan: Mary Garcia is a 78 y.o. female with: Mild persistent asthma without complication Past history - coughing, wheezing and SOB for 3-4 years with worsening during URIs. Used to be on Flovent , Qvar . Tried PPIs with no benefit. 2020 skin testing borderline positive to maple tree pollen. 2020 spirometry mild possible restrictive disease with 14% improvement in FEV post bronchodilator treatment.  Interim history - had a flare with recent respiratory infection requiring systemic steroids  otherwise doing well. Today's spirometry was normal. If you notice that the coughing doesn't improve then let me know - I may send Breyna  160mcg inhaler.  Daily controller medication(s): Breyna  80mcg 2 puffs twice a day with spacer and rinse mouth afterwards. Take 2 puffs twice a day from middle of August and the colder months.  Will decrease to 2 puffs once a day  during the warmer months.  May use albuterol  rescue inhaler 2 puffs every 4 to 6 hours as needed for shortness of breath, chest tightness, coughing, and wheezing. May use albuterol  rescue inhaler 2 puffs 5 to 15 minutes prior to strenuous physical activities. Monitor frequency of use - if you need to use it more than twice per week on a consistent basis let us  know.   Seasonal allergic rhinitis due to pollen Past history - Perennial rhinitis symptoms. 2020 skin testing borderline positive to tree pollen.  Interim history - asymptomatic with no meds.  Continue environmental control measures.  May use azelastine  nasal spray 1-2 sprays per nostril 1-2 times a day as needed for runny nose. May use over the counter antihistamines such as Zyrtec (cetirizine), Claritin (loratadine), Allegra (fexofenadine), or Xyzal (levocetirizine) daily as needed. May take twice a day during flares.      Dizziness and confusion Elevated blood pressure Acute dizziness and confusion post working out with elevated blood pressure reading in our clinic with no history of CAD, HTN, TIA or DM.  EMS was called for further evaluation. Husband was called and arrived to the clinic to accompany patient.  Patient was transported to [facility] ER via EMS.  Recommend flu shot in the fall.   Return in about 4 months (around 08/23/2024).  Meds ordered this encounter  Medications   albuterol  (VENTOLIN  HFA) 108 (90 Base) MCG/ACT inhaler    Sig: Inhale 2 puffs into the lungs every 4 (four) hours as needed for wheezing or shortness of breath (coughing fits).    Dispense:  18 g    Refill:  1   budesonide -formoterol  (BREYNA ) 80-4.5 MCG/ACT inhaler    Sig: Inhale 2 puffs into the lungs in the morning and at bedtime. Rinse mouth after each use.    Dispense:  1 each    Refill:  5   Lab Orders  No laboratory test(s) ordered today    Diagnostics: Spirometry:  Tracings reviewed. Her effort: Good reproducible efforts. FVC: 1.98L FEV1:  1.46L, 73% predicted FEV1/FVC ratio: 74% Interpretation: Spirometry consistent with normal pattern.  Please see scanned spirometry results for details.  Results discussed with patient/family.   Medication List:  Current Outpatient Medications  Medication Sig Dispense Refill   albuterol  (VENTOLIN  HFA) 108 (90 Base) MCG/ACT inhaler Inhale 2 puffs into the lungs every 4 (four) hours as needed for wheezing or shortness of breath (coughing fits). 18 g 1   ALPRAZolam  (XANAX ) 0.5 MG tablet One half to one tab twice daily as needed for anxiety. 60 tablet 3   amLODipine  (NORVASC ) 2.5 MG tablet Take 1 tablet by mouth once daily 90 tablet 1   aspirin  EC 81 MG tablet Take 1 tablet (81 mg total) by mouth daily. Swallow whole. 30 tablet 12   azelastine  (ASTELIN ) 0.1 % nasal spray Place 1-2 sprays in each nostril one to two times a day as needed for runny nose/drainage down throat 30 mL 5   budesonide -formoterol  (BREYNA ) 80-4.5 MCG/ACT inhaler Inhale 2 puffs into the lungs in the morning and at bedtime. Rinse mouth after each use. 1  each 5   Calcium  Carb-Cholecalciferol (CALCIUM  + VITAMIN D3 PO) Take 1 tablet by mouth at bedtime.     ELDERBERRY PO Take 1 capsule by mouth 2 (two) times daily.     ketoconazole (NIZORAL) 2 % cream Apply 1 application topically 2 (two) times daily as needed (rash).     rosuvastatin  (CRESTOR ) 10 MG tablet Take 1 tablet (10 mg total) by mouth daily. 90 tablet 3   zolpidem  (AMBIEN ) 10 MG tablet TAKE 1 TABLET BY MOUTH EVERY DAY AT BEDTIME AS NEEDED FOR SLEEP (Patient taking differently: Take 10 mg by mouth at bedtime. Takes 5mg  po at bedtime.) 90 tablet 1   Current Facility-Administered Medications  Medication Dose Route Frequency Provider Last Rate Last Admin   [START ON 08/07/2024] denosumab  (PROLIA ) injection 60 mg  60 mg Subcutaneous Once Gherghe, Cristina, MD       Allergies: No Known Allergies I reviewed her past medical history, social history, family history, and  environmental history and no significant changes have been reported from her previous visit.  Review of Systems  Constitutional:  Negative for appetite change, chills, fever and unexpected weight change.  HENT:  Negative for congestion, postnasal drip and rhinorrhea.   Eyes:  Negative for itching.  Respiratory:  Positive for cough. Negative for chest tightness, shortness of breath and wheezing.   Cardiovascular:  Negative for chest pain.  Gastrointestinal:  Negative for abdominal pain.  Genitourinary:  Negative for difficulty urinating.  Skin:  Negative for rash.  Allergic/Immunologic: Positive for environmental allergies. Negative for food allergies.  Neurological:  Negative for headaches.    Objective: BP 126/72   Pulse 72   Temp 98 F (36.7 C) (Temporal)   Resp 20   Wt 141 lb 1.9 oz (64 kg)   SpO2 98%   BMI 25.81 kg/m  Body mass index is 25.81 kg/m. Physical Exam Vitals and nursing note reviewed.  Constitutional:      Appearance: Normal appearance. She is well-developed.  HENT:     Head: Normocephalic and atraumatic.     Right Ear: Tympanic membrane and external ear normal.     Left Ear: Tympanic membrane and external ear normal.     Nose: Nose normal.     Mouth/Throat:     Mouth: Mucous membranes are moist.     Pharynx: Oropharynx is clear.  Eyes:     Conjunctiva/sclera: Conjunctivae normal.  Cardiovascular:     Rate and Rhythm: Normal rate and regular rhythm.     Heart sounds: Normal heart sounds. No murmur heard.    No friction rub. No gallop.  Pulmonary:     Effort: Pulmonary effort is normal.     Breath sounds: Normal breath sounds. No wheezing or rales.  Musculoskeletal:     Cervical back: Neck supple.  Skin:    General: Skin is warm.     Findings: No rash.  Neurological:     Mental Status: She is alert and oriented to person, place, and time.  Psychiatric:        Mood and Affect: Mood normal.        Behavior: Behavior normal.    Previous notes and  tests were reviewed. The plan was reviewed with the patient/family, and all questions/concerned were addressed.  It was my pleasure to see Mary Garcia today and participate in her care. Please feel free to contact me with any questions or concerns.  Sincerely,  Orlan Cramp, DO Allergy  & Immunology  [facility]  [place] office: [phone] [facility] office: [phone]

## 2024-04-24 ENCOUNTER — Ambulatory Visit: Payer: Self-pay | Admitting: Internal Medicine

## 2024-04-24 LAB — LIPID PANEL
Cholesterol: 186 mg/dL (ref <200)
HDL: 106 mg/dL (ref >=50)
LDL Cholesterol (Calc): 58 mg/dL
Non-HDL Cholesterol (Calc): 80 mg/dL (ref <130)
Total CHOL/HDL Ratio: 1.8 (calc) (ref <5.0)
Triglycerides: 138 mg/dL (ref <150)

## 2024-04-24 LAB — HEPATIC FUNCTION PANEL
AG Ratio: 2 (calc) (ref 1.0–2.5)
ALT: 16 U/L (ref 6–29)
AST: 17 U/L (ref 10–35)
Albumin: 4.2 g/dL (ref 3.6–5.1)
Alkaline phosphatase (APISO): 53 U/L (ref 37–153)
Bilirubin, Direct: 0.1 mg/dL (ref 0.0–0.2)
Globulin: 2.1 g/dL (ref 1.9–3.7)
Indirect Bilirubin: 0.4 mg/dL (ref 0.2–1.2)
Total Bilirubin: 0.5 mg/dL (ref 0.2–1.2)
Total Protein: 6.3 g/dL (ref 6.1–8.1)

## 2024-04-25 ENCOUNTER — Other Ambulatory Visit

## 2024-04-25 ENCOUNTER — Encounter: Payer: Self-pay | Admitting: Gastroenterology

## 2024-04-25 ENCOUNTER — Ambulatory Visit (AMBULATORY_SURGERY_CENTER)

## 2024-04-25 VITALS — Ht 62.0 in | Wt 140.0 lb

## 2024-04-25 DIAGNOSIS — Z1211 Encounter for screening for malignant neoplasm of colon: Secondary | ICD-10-CM

## 2024-04-25 MED ORDER — NA SULFATE-K SULFATE-MG SULF 17.5-3.13-1.6 GM/177ML PO SOLN: 1.0000 | Freq: Once | ORAL | 0 refills | Status: AC

## 2024-04-25 NOTE — Progress Notes (Signed)
 Pre visit completed via phone call; Patient verified name, DOB, and address; No egg or soy allergy known to patient;  No issues known to pt with past sedation with any surgeries or procedures; Patient denies ever being told they had issues or difficulty with intubation;  No FH of Malignant Hyperthermia; Pt is not on diet pills; Pt is not on home 02;  Pt is not on blood thinners;  Pt denies issues with constipation;  No A fib or A flutter; Have any cardiac testing pending--NO Insurance verified during PV appt--- BCBS Medicare Pt can ambulate without assistance;  Pt denies use of chewing tobacco; Discussed diabetic/weight loss medication holds; Discussed NSAID holds; Checked BMI to be less than 50; Pt instructed to use Single[employer]com or GoodRx for a price reduction on prep;  Patient's chart reviewed by Rogena Class CNRA prior to previsit and patient appropriate for the [facility];  Pre visit completed and red dot placed by patient's name on their procedure day (on provider's schedule);    Instructions sent to MyChart per patient request;

## 2024-05-02 ENCOUNTER — Encounter: Payer: Self-pay | Admitting: Internal Medicine

## 2024-05-02 ENCOUNTER — Ambulatory Visit (INDEPENDENT_AMBULATORY_CARE_PROVIDER_SITE_OTHER): Admitting: Internal Medicine

## 2024-05-02 VITALS — BP 110/88 | HR 106 | Ht 62.0 in | Wt 140.0 lb

## 2024-05-02 DIAGNOSIS — J683 Other acute and subacute respiratory conditions due to chemicals, gases, fumes and vapors: Secondary | ICD-10-CM

## 2024-05-02 DIAGNOSIS — M810 Age-related osteoporosis without current pathological fracture: Secondary | ICD-10-CM

## 2024-05-02 DIAGNOSIS — Z8659 Personal history of other mental and behavioral disorders: Secondary | ICD-10-CM | POA: Diagnosis not present

## 2024-05-02 DIAGNOSIS — Z1211 Encounter for screening for malignant neoplasm of colon: Secondary | ICD-10-CM

## 2024-05-02 DIAGNOSIS — E7800 Pure hypercholesterolemia, unspecified: Secondary | ICD-10-CM

## 2024-05-02 DIAGNOSIS — G4700 Insomnia, unspecified: Secondary | ICD-10-CM

## 2024-05-02 NOTE — Progress Notes (Shared)
 Patient Care Team: Perri Ronal PARAS, MD as PCP - General (Internal Medicine) Luke Orlan HERO, DO as Consulting Physician (Allergy )  Visit Date: 05/02/24  Subjective:    Patient ID: Mary Garcia , Female   DOB: 09/24/1945, 78 y.o.    MRN: [id number]   78 y.o. Female presents today for 6 month follow up. Patient has a past medical history of Insomnia, Migraine headaches, Vitamin D  deficiency, Hyperlipidemia, Seasonal allergic rhinitis.  Patient was admitted in late May to [facility] on 01/10/2024 after experiencing a brief episode of what she described as mild confusion, left hand numbness and dizziness while waiting to be seen at her allergist office that day.  Earlier that morning she had been to an exercise class where she exercised vigorously which is not unusual for her.  She is in excellent physical condition.  EMS was called to the allergy  office and patient was seen at [facility] and admitted.  In the emergency department CT of the head was negative and MRI showed mild chronic small vessel disease.  2D echocardiogram showed normal ejection fraction, grade 1 diastolic dysfunction.  She was admitted overnight and was stable throughout her entire hospitalization.  Was evaluated by neurohospitalist, Dr. Arvella and he felt that her symptoms might have been related to anxiety and elevated blood pressure and that it was less likely she suffered TIA.  None the latest was told to stay with statin medication.  Has had follow-up with nurse practitioner at [facility] on June 25.  She was recently seen in this office on 04/16/2024 for an acute lower respiratory infection treated with Zithromax  250 mg 2 tablets on the first day and 1 tablet on days 2-5, Medrol  4 mg on a tapering course 6-5-4-3-2-1, and hycodan syrup 5-1.5 mg/ 5 mL 1 teaspoon every 8 hours as needed for cough.  She says she feels she has improved since then.  History of elevated Cholesterol. 04/23/2024 Lipid Panel CHOL 186, HDL 106,  Triglycerides 138, LDL 58. CHOL improved from 01/11/24 CHOL 225. Liver panel normal.  Had recent trip to [place] and did fine.   Labs 04/23/2024  Lipid Panel and hepatic function panel normal   01/01/2014 Colonoscopy mild diverticulosis of the left colon otherwise normal colon. Repeat in ten years.    Vaccine counseling: Tetanus, Covid-19 and influenza vaccine due.    Past Medical History:  Diagnosis Date   Anxiety    Arthritis    Asthma    Bronchitis, acute    TIA (transient ischemic attack) 12/2023     Family History  Problem Relation Age of Onset   Asthma Mother    Brain cancer Father    Colon cancer Neg Hx    Colon polyps Neg Hx    Esophageal cancer Neg Hx    Rectal cancer Neg Hx    Stomach cancer Neg Hx    Father, deceased aged 51 due to Brain Cancer Mother, deceased aged 66 of Pneumonia, w/ hx of Alzheimer's Disease, Coronary Disease, Osteoporosis, and Hip Replacement 2 Brothers - 1 w/ hx of Prostate Cancer; Other deceased of complications due to Covid-19 1 Sister in good health.        Review of Systems  All other systems reviewed and are negative. No chest pain, shortness of breath, headache, dysesthesias, extremity weakness Objective:   Vitals: BP 110/88   Pulse (!) 106   Ht 5' 2 (1.575 m)   Wt 140 lb (63.5 kg)  SpO2 97%   BMI 25.61 kg/m    Physical Exam Vitals and nursing note reviewed.  Constitutional:      General: She is not in acute distress.    Appearance: Normal appearance. She is not toxic-appearing.  HENT:     Head: Normocephalic and atraumatic.  Neck:     Vascular: No carotid bruit.  Cardiovascular:     Rate and Rhythm: Normal rate and regular rhythm. No extrasystoles are present.    Pulses: Normal pulses.     Heart sounds: Normal heart sounds. No murmur heard.    No friction rub. No gallop.  Pulmonary:     Effort: Pulmonary effort is normal. No respiratory distress.     Breath sounds: Normal breath sounds. No wheezing  or rales.  Musculoskeletal:     Right lower leg: No edema.     Left lower leg: No edema.  Skin:    General: Skin is warm and dry.  Neurological:     Mental Status: She is alert and oriented to person, place, and time. Mental status is at baseline.  Psychiatric:        Mood and Affect: Mood normal.        Behavior: Behavior normal.        Thought Content: Thought content normal.        Judgment: Judgment normal.       Results:   Studies obtained and personally reviewed by me:   01/01/2014 Colonoscopy mild diverticulosis of the left colon otherwise normal colon. Repeat in ten years.    Labs:       Component Value Date/Time   NA 139 01/10/2024 1231   K 3.7 01/10/2024 1231   CL 104 01/10/2024 1231   CO2 25 01/10/2024 1226   GLUCOSE 106 (H) 01/10/2024 1231   BUN 16 01/10/2024 1231   CREATININE 0.80 01/10/2024 1231   CREATININE 0.75 11/14/2023 1128   CALCIUM  9.6 01/10/2024 1226   PROT 6.3 04/23/2024 0925   ALBUMIN 3.9 01/10/2024 1226   AST 17 04/23/2024 0925   ALT 16 04/23/2024 0925   ALKPHOS 57 01/10/2024 1226   BILITOT 0.5 04/23/2024 0925   GFRNONAA >60 01/10/2024 1226   GFRNONAA 76 11/03/2020 0932   GFRAA 88 11/03/2020 0932     Lab Results  Component Value Date   WBC 6.1 01/10/2024   HGB 14.6 01/10/2024   HCT 43.0 01/10/2024   MCV 87.0 01/10/2024   PLT 290 01/10/2024    Lab Results  Component Value Date   CHOL 186 04/23/2024   HDL 106 04/23/2024   LDLCALC 58 04/23/2024   TRIG 138 04/23/2024   CHOLHDL 1.8 04/23/2024    Lab Results  Component Value Date   HGBA1C 5.3 01/11/2024     Lab Results  Component Value Date   TSH 1.95 11/14/2023      Assessment & Plan:    Recent acute Lower respiratory Infection-resolved-- recently had an acute lower respiratory infection treated with Zithromax  250 2 on the 1st day and 1 one days 2-5, Medrol  4 mg on a tapering course 6-5-4-3-2-1, and Hycodan 5-1.5 mg/5 mL syrup 1 teaspoon every 8 hours as needed for  cough.  She says she feels she has improved since then.   History of pure hypercholesterolemia : 04/23/2024 Lipid Panel CHOL 186, HDL 106, Triglycerides 138, LDL 58. CHOL decreased from 01/11/24 CHOL 225. Liver panel normal.  History of suspected TIA and was seen in the ED may 2025. She has seen  neurologist Duwaine Sherryl PIETY on 02/07/2024. stroke has been ruled out with CT normal in the ED. Continue statin medication and monitor for further symptoms.  Is taking low-dose aspirin  81 mg daily.  Health Maintenance: 01/01/2014 Colonoscopy Normal colon mild diverticulosis of the left colon. Repeat due.  Ordered Cologuard   Vaccine counseling: Tetanus, Covid and influenza vaccine due.  Patient was informed and expresses understanding. Patient has Medicare and is directed to local pharmacy for vaccines.   Annual wellness visit scheduled for April 2026.  Longstanding history of insomnia treated with Ambien  without side effects Osteoporosis-due for DEXA scan  I,Makayla C Reid,acting as a scribe for Ronal JINNY Hailstone, MD.,have documented all relevant documentation on the behalf of Ronal JINNY Hailstone, MD,as directed by  Ronal JINNY Hailstone, MD while in the presence of Ronal JINNY Hailstone, MD.

## 2024-05-02 NOTE — Patient Instructions (Signed)
 Continue current medications. Vaccines discussed. Return in April for annual exam and medicare wellness visit. No change in medications.

## 2024-05-09 ENCOUNTER — Encounter: Admitting: Gastroenterology

## 2024-05-13 DIAGNOSIS — Z1211 Encounter for screening for malignant neoplasm of colon: Secondary | ICD-10-CM | POA: Diagnosis not present

## 2024-05-17 LAB — COLOGUARD: COLOGUARD: NEGATIVE

## 2024-05-19 ENCOUNTER — Ambulatory Visit: Payer: Self-pay | Admitting: Internal Medicine

## 2024-06-13 ENCOUNTER — Ambulatory Visit: Admitting: Internal Medicine

## 2024-06-13 ENCOUNTER — Ambulatory Visit: Payer: Self-pay | Admitting: Internal Medicine

## 2024-06-13 ENCOUNTER — Encounter: Payer: Self-pay | Admitting: Internal Medicine

## 2024-06-13 VITALS — BP 110/80 | HR 78 | Ht 62.0 in | Wt 143.0 lb

## 2024-06-13 DIAGNOSIS — Z8659 Personal history of other mental and behavioral disorders: Secondary | ICD-10-CM

## 2024-06-13 DIAGNOSIS — R251 Tremor, unspecified: Secondary | ICD-10-CM

## 2024-06-13 DIAGNOSIS — Z8673 Personal history of transient ischemic attack (TIA), and cerebral infarction without residual deficits: Secondary | ICD-10-CM

## 2024-06-13 DIAGNOSIS — R7989 Other specified abnormal findings of blood chemistry: Secondary | ICD-10-CM

## 2024-06-13 DIAGNOSIS — E7800 Pure hypercholesterolemia, unspecified: Secondary | ICD-10-CM

## 2024-06-13 NOTE — Patient Instructions (Signed)
 Referral to Neurology to evaluate tremor which I think is an essential tremor. Hx of TIA earlier this year maintained on ASA.Labs drawn including B12, folate, Mg, free T4 and TSH.

## 2024-06-13 NOTE — Progress Notes (Signed)
 Patient Care Team: Perri Ronal PARAS, MD as PCP - General (Internal Medicine) Luke Orlan HERO, DO as Consulting Physician (Allergy )  Visit Date: 06/13/24  Subjective:    Patient ID: Mary Garcia , Female   DOB: 14-Sep-1945, 78 y.o.    MRN: [id number]   78 y.o. Female presents today for Tremors. Patient has a past medical history of IBS, Anxiety, Depression, insomnia.  She said the she has noticed that at times her voice will tremble. When she notices she said that she can make her voice go back to normal. She also noticed that she she has a tremor in both hands. She's had these symptoms for a couple of weeks. She mentioned  that she wakes up with numbness in her hands, principally the right hand. She denies having seizures, blackouts or trouble thinking. Her brother was diagnosed with Parkinson's disease five years ago.    In May she was admitted with confusion, lightheadedness, left arm/hand tingling. MRI showed mild chronic small vessel ischemic disease . Echocardiogram showed  grade 1 diastolic dysfunction. Was started on Crestor  10 mg daily as LDL was 116. Hgb AIC was normal. Discharged on 81 mg daily ASA  Past Medical History:  Diagnosis Date   Anxiety    Arthritis    Asthma    Bronchitis, acute    TIA (transient ischemic attack) 12/2023     Family History  Problem Relation Age of Onset   Asthma Mother    Brain cancer Father    Colon cancer Neg Hx    Colon polyps Neg Hx    Esophageal cancer Neg Hx    Rectal cancer Neg Hx    Stomach cancer Neg Hx     Social History   Social History Narrative   Retired from being a Armed Forces Operational Officer. Former Smoker - quit smoking circa 30 years ago. Minimal social alcohol consumption. Married - husband is a forensic scientist. 1 Son. Prior to her Left TKA she exercised a lot, still exercises regularly.   Caffiene: 1 expresso latte in am, now english breakfast tea   Lives with husband, pets 1 dog      Review of Systems  Neurological:  Positive  for tremors and speech change.        Objective:   Vitals: BP 110/80   Pulse 78   Ht 5' 2 (1.575 m)   Wt 143 lb (64.9 kg)   SpO2 98%   BMI 26.16 kg/m    Physical Exam Neurological:     Mental Status: She is alert and oriented to person, place, and time.     Sensory: Sensation is intact.     Motor: Motor function is intact.     Deep Tendon Reflexes: Reflexes are normal and symmetric.     Comments: Slight tremor of right hand.      Muscle strength is normal. DTRs are 2+ and symmetrical. No facial weakness.Alert and oriented x 3. Affect is appropriate. Sensation intact.  Results:     Labs:       Component Value Date/Time   NA 139 01/10/2024 1231   K 3.7 01/10/2024 1231   CL 104 01/10/2024 1231   CO2 25 01/10/2024 1226   GLUCOSE 106 (H) 01/10/2024 1231   BUN 16 01/10/2024 1231   CREATININE 0.80 01/10/2024 1231   CREATININE 0.75 11/14/2023 1128   CALCIUM  9.6 01/10/2024 1226   PROT 6.3 04/23/2024 0925   ALBUMIN 3.9 01/10/2024 1226   AST 17 04/23/2024 0925  ALT 16 04/23/2024 0925   ALKPHOS 57 01/10/2024 1226   BILITOT 0.5 04/23/2024 0925   GFRNONAA >60 01/10/2024 1226   GFRNONAA 76 11/03/2020 0932   GFRAA 88 11/03/2020 0932     Lab Results  Component Value Date   WBC 6.1 01/10/2024   HGB 14.6 01/10/2024   HCT 43.0 01/10/2024   MCV 87.0 01/10/2024   PLT 290 01/10/2024    Lab Results  Component Value Date   CHOL 186 04/23/2024   HDL 106 04/23/2024   LDLCALC 58 04/23/2024   TRIG 138 04/23/2024   CHOLHDL 1.8 04/23/2024    Lab Results  Component Value Date   HGBA1C 5.3 01/11/2024     Lab Results  Component Value Date   TSH 1.95 11/14/2023        Assessment & Plan:    Orders Placed This Encounter  Procedures   B12 and Folate Panel   Magnesium    T4, free   TSH   Ambulatory referral to Neurology    Referral Priority:   Routine    Referral Type:   Consultation    Referral Reason:   Specialty Services Required    Referred to Provider:    Chalice Saunas, MD    Requested Specialty:   Neurology    Number of Visits Requested:   1   Tremor of both hands: She said the she has noticed that at times her voice will tremble. When she notices she said that she can make her voice go back to normal. She also noticed that she she has a tremor in both hands. She's had these symptoms for a couple of weeks. She mentioned  that she wakes up with numbness in her hands, principally the right hand. She denies having seizures, blackouts or trouble thinking. Her brother was diagnosed with Parkinson's disease five years ago.    B12 and folate, Magnesium , Free T4 and TSH ordered.    Referred to Neurology.  I think she has an essential tremor.  Dx with TIA in late May admitted to hospital and d/ced on low dose aspirin . Echo was normal except for grade I diastolic dysfunction. Started on Crestor  for mild elevation in LDL. Head CT negative, MRI showed chronic small vessel ischemic disease.  I,Makayla C Reid,acting as a scribe for Ronal JINNY Hailstone, MD.,have documented all relevant documentation on the behalf of Ronal JINNY Hailstone, MD,as directed by  Ronal JINNY Hailstone, MD while in the presence of Ronal JINNY Hailstone, MD.

## 2024-06-14 LAB — T4, FREE: Free T4: 1 ng/dL (ref 0.8–1.8)

## 2024-06-14 LAB — MAGNESIUM: Magnesium: 2.3 mg/dL (ref 1.5–2.5)

## 2024-06-14 LAB — B12 AND FOLATE PANEL
Folate: >24 ng/mL
Vitamin B-12: 530 pg/mL (ref 200–1100)

## 2024-06-14 LAB — TSH: TSH: 1.37 m[IU]/L (ref 0.40–4.50)

## 2024-06-17 ENCOUNTER — Ambulatory Visit: Admitting: Cardiovascular Disease

## 2024-06-17 ENCOUNTER — Other Ambulatory Visit: Payer: Self-pay | Admitting: Internal Medicine

## 2024-06-27 DIAGNOSIS — B009 Herpesviral infection, unspecified: Secondary | ICD-10-CM | POA: Diagnosis not present

## 2024-06-27 DIAGNOSIS — B351 Tinea unguium: Secondary | ICD-10-CM | POA: Diagnosis not present

## 2024-06-27 DIAGNOSIS — L570 Actinic keratosis: Secondary | ICD-10-CM | POA: Diagnosis not present

## 2024-07-01 NOTE — Telephone Encounter (Signed)
 Prolia  VOB initiated via [email or web address]  Next Prolia  inj DUE: 08/08/24

## 2024-07-14 NOTE — Telephone Encounter (Signed)
 Medical Buy and Bill  Prior Authorization REQUIRED for PROLIA    Primary: BCBS [place] Medicare

## 2024-07-15 ENCOUNTER — Ambulatory Visit: Attending: Cardiovascular Disease | Admitting: Cardiovascular Disease

## 2024-07-15 ENCOUNTER — Encounter: Payer: Self-pay | Admitting: Cardiovascular Disease

## 2024-07-15 VITALS — BP 126/80 | HR 91 | Ht 62.0 in | Wt 142.8 lb

## 2024-07-15 DIAGNOSIS — I2510 Atherosclerotic heart disease of native coronary artery without angina pectoris: Secondary | ICD-10-CM | POA: Diagnosis not present

## 2024-07-15 NOTE — Progress Notes (Signed)
 Chief Complaint  Patient presents with   New Patient (Initial Visit)    TIA    History of Present Illness: 78 yo female with history of anxiety, arthritis, asthma and possible TIA who is here today as a new patient to establish cardiac are. CT coronary calcium  score of 2 in January 2024. Echo May 2025 with LVEF=60-65%. No valve disease. She tells me today that she had a possible TIA in May 2025. Brain MRI in May 2025 with no evidence of stroke. She has been on Crestor  and ASA. She feels well overall. No chest pain or dyspnea. She is very active.   Primary Care Physician: Perri Ronal PARAS, MD   Past Medical History:  Diagnosis Date   Anxiety    Arthritis    Asthma    Bronchitis, acute    TIA (transient ischemic attack) 12/2023    Past Surgical History:  Procedure Laterality Date   BREAST ENHANCEMENT SURGERY     CATARACT EXTRACTION W/ INTRAOCULAR LENS IMPLANT Bilateral    FACELIFT     local   ROTATOR CUFF REPAIR Right    TIBIA FRACTURE SURGERY  2004   left leg x3, tibia and fibula   TOTAL KNEE ARTHROPLASTY Left 10/18/2021   Procedure: TOTAL KNEE ARTHROPLASTY;  Surgeon: Rubie Kemps, MD;  Location: [facility];  Service: Orthopedics;  Laterality: Left;   TUMOR EXCISION  2003   right muscle back, benign    Current Outpatient Medications  Medication Sig Dispense Refill   albuterol  (VENTOLIN  HFA) 108 (90 Base) MCG/ACT inhaler Inhale 2 puffs into the lungs every 4 (four) hours as needed for wheezing or shortness of breath (coughing fits). 18 g 1   ALPRAZolam  (XANAX ) 0.5 MG tablet One half to one tab twice daily as needed for anxiety. (Patient taking differently: Take 0.5 mg by mouth at bedtime as needed. One half to one tab twice daily as needed for anxiety.) 60 tablet 3   amLODipine  (NORVASC ) 2.5 MG tablet Take 1 tablet by mouth once daily 90 tablet 1   aspirin  EC 81 MG tablet Take 1 tablet (81 mg total) by mouth daily. Swallow whole. 30 tablet 12   azelastine  (ASTELIN ) 0.1 % nasal  spray Place 1-2 sprays in each nostril one to two times a day as needed for runny nose/drainage down throat (Patient taking differently: Place 1 spray into both nostrils daily as needed. Place 1-2 sprays in each nostril one to two times a day as needed for runny nose/drainage down throat) 30 mL 5   budesonide -formoterol  (BREYNA ) 80-4.5 MCG/ACT inhaler Inhale 2 puffs into the lungs in the morning and at bedtime. Rinse mouth after each use. 1 each 5   Calcium  Carb-Cholecalciferol (CALCIUM  + VITAMIN D3 PO) Take 1 tablet by mouth at bedtime.     ELDERBERRY PO Take 1 capsule by mouth 2 (two) times daily.     ketoconazole (NIZORAL) 2 % cream Apply 1 application topically 2 (two) times daily as needed (rash).     rosuvastatin  (CRESTOR ) 10 MG tablet Take 1 tablet (10 mg total) by mouth daily. 90 tablet 3   terbinafine (LAMISIL) 250 MG tablet Take 250 mg by mouth daily.     zolpidem  (AMBIEN ) 10 MG tablet TAKE 1 TABLET BY MOUTH EVERY DAY AT BEDTIME AS NEEDED FOR SLEEP 90 tablet 1   Current Facility-Administered Medications  Medication Dose Route Frequency Provider Last Rate Last Admin   [START ON 08/07/2024] denosumab  (PROLIA ) injection 60 mg  60 mg  Subcutaneous Once Gherghe, Cristina, MD        No Known Allergies  Social History   Socioeconomic History   Marital status: Married    Spouse name: Not on file   Number of children: 1   Years of education: Not on file   Highest education level: Bachelor's degree (e.g., BA, AB, BS)  Occupational History   Occupation: retired  Tobacco Use   Smoking status: Former    Current packs/day: 0.00    Average packs/day: 0.4 packs/day for 15.0 years (6.0 ttl pk-yrs)    Types: Cigarettes    Start date: 08/16/1963    Quit date: 08/15/1978    Years since quitting: 45.9   Smokeless tobacco: Never   Tobacco comments:    smokes 5 cigs daily when smoking  Vaping Use   Vaping status: Never Used  Substance and Sexual Activity   Alcohol use: Not Currently     Alcohol/week: 3.0 standard drinks of alcohol    Types: 3 Standard drinks or equivalent per week   Drug use: No   Sexual activity: Not on file  Other Topics Concern   Not on file  Social History Narrative   Retired from being a Armed Forces Operational Officer. Former Smoker - quit smoking circa 30 years ago. Minimal social alcohol consumption. Married - husband is a forensic scientist. 1 Son. Prior to her Left TKA she exercised a lot, still exercises regularly.   Caffiene: 1 expresso latte in am, now english breakfast tea   Lives with husband, pets 1 dog   Social Drivers of Corporate Investment Banker Strain: Low Risk  (01/30/2024)   Overall Financial Resource Strain (CARDIA)    Difficulty of Paying Living Expenses: Not hard at all  Food Insecurity: No Food Insecurity (01/30/2024)   Hunger Vital Sign    Worried About Running Out of Food in the Last Year: Never true    Ran Out of Food in the Last Year: Never true  Transportation Needs: No Transportation Needs (01/30/2024)   PRAPARE - Administrator, Civil Service (Medical): No    Lack of Transportation (Non-Medical): No  Physical Activity: Insufficiently Active (01/30/2024)   Exercise Vital Sign    Days of Exercise per Week: 3 days    Minutes of Exercise per Session: 30 min  Stress: No Stress Concern Present (01/30/2024)   [employer] of Occupational Health - Occupational Stress Questionnaire    Feeling of Stress: Only a little  Social Connections: Moderately Isolated (01/30/2024)   Social Connection and Isolation Panel    Frequency of Communication with Friends and Family: More than three times a week    Frequency of Social Gatherings with Friends and Family: Three times a week    Attends Religious Services: Never    Active Member of Clubs or Organizations: No    Attends Banker Meetings: Not on file    Marital Status: Married  Catering Manager Violence: Not At Risk (01/11/2024)   Humiliation, Afraid, Rape, and Kick  questionnaire    Fear of Current or Ex-Partner: No    Emotionally Abused: No    Physically Abused: No    Sexually Abused: No    Family History  Problem Relation Age of Onset   Asthma Mother    Brain cancer Father    Colon cancer Neg Hx    Colon polyps Neg Hx    Esophageal cancer Neg Hx    Rectal cancer Neg Hx    Stomach cancer  Neg Hx     Review of Systems:  As stated in the HPI and otherwise negative.   BP 126/80   Pulse 91   Ht 5' 2 (1.575 m)   Wt 142 lb 12.8 oz (64.8 kg)   SpO2 98%   BMI 26.12 kg/m   Physical Examination: General: Well developed, well nourished, NAD  HEENT: OP clear, mucus membranes moist  SKIN: warm, dry. No rashes. Neuro: No focal deficits  Musculoskeletal: Muscle strength 5/5 all ext  Psychiatric: Mood and affect normal  Neck: No JVD, no carotid bruits, no thyromegaly, no lymphadenopathy.  Lungs:Clear bilaterally, no wheezes, rhonci, crackles Cardiovascular: Regular rate and rhythm. No murmurs, gallops or rubs. Abdomen:Soft. Bowel sounds present. Non-tender.  Extremities: No lower extremity edema. Pulses are 2 + in the bilateral DP/PT.  EKG:  EKG is ordered today. The ekg ordered today demonstrates  EKG Interpretation Date/Time:  Monday July 15 2024 15:30:33 EST Ventricular Rate:  92 PR Interval:  136 QRS Duration:  64 QT Interval:  344 QTC Calculation: 425 R Axis:   -25  Text Interpretation: Normal sinus rhythm Confirmed by Verlin Bruckner [phone]) on 07/15/2024 3:48:19 PM    Recent Labs: 01/10/2024: BUN 16; Creatinine, Ser 0.80; Hemoglobin 14.6; Platelets 290; Potassium 3.7; Sodium 139 04/23/2024: ALT 16 06/13/2024: Magnesium  2.3; TSH 1.37   Lipid Panel    Component Value Date/Time   CHOL 186 04/23/2024 0925   TRIG 138 04/23/2024 0925   HDL 106 04/23/2024 0925   CHOLHDL 1.8 04/23/2024 0925   VLDL 13 01/11/2024 0505   LDLCALC 58 04/23/2024 0925     Wt Readings from Last 3 Encounters:  07/15/24 142 lb 12.8 oz (64.8  kg)  06/13/24 143 lb (64.9 kg)  05/02/24 140 lb (63.5 kg)    Assessment and Plan:   1. CAD without angina: She has a calcium  score of 2. She is on ASA and Crestor . No worrisome symptoms. EKG does not show ischemic changes. LV function was normal by echo in May 2025. Cardiac exam is normal.  -Continue ASA and statin  Labs/ tests ordered today include:  Orders Placed This Encounter  Procedures   EKG 12-Lead   Disposition:   F/U with me in one year  Signed, Bruckner Verlin, MD, [facility] 07/15/2024 4:26 PM    [facility] [address] [place], [place], [place]  [address] Phone: [phone]; Fax: [phone]

## 2024-07-15 NOTE — Patient Instructions (Signed)
 Medication Instructions:  Your physician recommends that you continue on your current medications as directed. Please refer to the Current Medication list given to you today.  *If you need a refill on your cardiac medications before your next appointment, please call your pharmacy*  Lab Work: none If you have labs (blood work) drawn today and your tests are completely normal, you will receive your results only by: MyChart Message (if you have MyChart) OR A paper copy in the mail If you have any lab test that is abnormal or we need to change your treatment, we will call you to review the results.  Testing/Procedures: none  Follow-Up: At [facility], you and your health needs are our priority.  As part of our continuing mission to provide you with exceptional heart care, our providers are all part of one team.  This team includes your primary Cardiologist (physician) and Advanced Practice Providers or APPs (Physician Assistants and Nurse Practitioners) who all work together to provide you with the care you need, when you need it.  Your next appointment:   12 month(s)  Provider:   Lonni Cash, MD    We recommend signing up for the patient portal called MyChart.  Sign up information is provided on this After Visit Summary.  MyChart is used to connect with patients for Virtual Visits (Telemedicine).  Patients are able to view lab/test results, encounter notes, upcoming appointments, etc.  Non-urgent messages can be sent to your provider as well.   To learn more about what you can do with MyChart, go to [email or web address].

## 2024-07-20 ENCOUNTER — Other Ambulatory Visit: Payer: Self-pay | Admitting: Internal Medicine

## 2024-07-21 NOTE — Telephone Encounter (Signed)
 Medical Buy and Bill  Prior Authorization initiated for PROLIA  via [employer] / [employer] portal  RX Case#: [id number] Authorization Status: In Progress  Reason: Pending Clinical Review

## 2024-07-22 NOTE — Telephone Encounter (Signed)
 Requested Prescriptions  Pending Prescriptions Disp Refills  ALPRAZolam  (XANAX ) 0.5 MG tablet [Pharmacy Med Name: ALPRAZolam  0.5 MG Oral Tablet] 60 tablet 0   Sig: TAKE 1/2 TO 1 (ONE-HALF TO ONE) TABLET BY MOUTH TWICE DAILY AS NEEDED FOR ANXIETY  Last refill:  01/16/2024 60 tablet + 3 refills

## 2024-07-23 NOTE — Telephone Encounter (Signed)
 Medical Buy and [employer]  Prior Authorization for PROLIA  APPROVED PA# [id number] Valid: 07/21/24-07/21/25

## 2024-07-28 NOTE — Telephone Encounter (Signed)
 [facility]  Patient is ready for scheduling on or after 08/08/24  Out-of-pocket cost due at time of visit: $372.56  Primary: [facility] [facility] [employer] Advantage H[employer]-POS Prolia  co-insurance: 20% (approximately $331.87) Admin fee co-insurance: $20  Deductible: does not apply  Prior Auth: APPROVED PA# [id number] Valid: 07/21/24-07/21/25  Secondary: N/A Prolia  co-insurance:  Admin fee co-insurance:  Deductible:  Prior Auth:  PA# Valid:   ** This summary of benefits is an estimation of the patient's out-of-pocket cost. Exact cost may vary based on individual plan coverage.

## 2024-07-29 DIAGNOSIS — Z79899 Other long term (current) drug therapy: Secondary | ICD-10-CM | POA: Diagnosis not present

## 2024-08-13 ENCOUNTER — Ambulatory Visit

## 2024-08-16 ENCOUNTER — Ambulatory Visit

## 2024-08-16 DIAGNOSIS — M810 Age-related osteoporosis without current pathological fracture: Secondary | ICD-10-CM

## 2024-08-16 MED ORDER — DENOSUMAB 60 MG/ML ~~LOC~~ SOSY: 60.0000 mg | PREFILLED_SYRINGE | Freq: Once | SUBCUTANEOUS | Status: AC

## 2024-08-16 NOTE — Progress Notes (Signed)
 Medication: Prolia  Dosage:60 mg Location: Left Arm Injection given ab:[id number],MFJ Provider: Lela Trixie COME

## 2024-08-19 ENCOUNTER — Encounter: Payer: Self-pay | Admitting: Internal Medicine

## 2024-08-22 NOTE — Telephone Encounter (Signed)
 Last Prolia  inj 08/16/24 Next Prolia  inj due 02/14/25

## 2024-08-25 NOTE — Progress Notes (Unsigned)
 "  Follow Up Note  RE: Mary Garcia MRN: [id number] DOB: [birth year] Date of Office Visit: 08/26/2024  Referring provider: Perri Ronal PARAS, MD Primary care provider: Perri Ronal PARAS, MD  Chief Complaint: No chief complaint on file.  History of Present Illness: I had the pleasure of seeing Mary Garcia for a follow up visit at the [facility] on 08/26/2024. She is a 79 y.o. female, who is being followed for asthma, allergic rhinitis. Her previous allergy  office visit was on 04/23/2024 with Dr. Luke. Today is a regular follow up visit.  Discussed the use of AI scribe software for clinical note transcription with the patient, who gave verbal consent to proceed.  History of Present Illness            ***  Assessment and Plan: Mary Garcia is a 79 y.o. female with: Mild persistent asthma without complication Past history - coughing, wheezing and SOB for 3-4 years with worsening during URIs. Used to be on Flovent , Qvar . Tried PPIs with no benefit. 2020 skin testing borderline positive to maple tree pollen. 2020 spirometry mild possible restrictive disease with 14% improvement in FEV post bronchodilator treatment.  Interim history - had a flare with recent respiratory infection requiring systemic steroids otherwise doing well. Today's spirometry was normal. If you notice that the coughing doesn't improve then let me know - I may send Breyna  160mcg inhaler.  Daily controller medication(s): Breyna  80mcg 2 puffs twice a day with spacer and rinse mouth afterwards. Take 2 puffs twice a day from middle of August and the colder months.  Will decrease to 2 puffs once a day during the warmer months.  May use albuterol  rescue inhaler 2 puffs every 4 to 6 hours as needed for shortness of breath, chest tightness, coughing, and wheezing. May use albuterol  rescue inhaler 2 puffs 5 to 15 minutes prior to strenuous physical activities. Monitor frequency of use - if you need to use it more than twice per  week on a consistent basis let us  know.    Seasonal allergic rhinitis due to pollen Past history - Perennial rhinitis symptoms. 2020 skin testing borderline positive to tree pollen.  Interim history - asymptomatic with no meds.  Continue environmental control measures.  May use azelastine  nasal spray 1-2 sprays per nostril 1-2 times a day as needed for runny nose. May use over the counter antihistamines such as Zyrtec (cetirizine), Claritin (loratadine), Allegra (fexofenadine), or Xyzal (levocetirizine) daily as needed. May take twice a day during flares.      Dizziness and confusion Elevated blood pressure Acute dizziness and confusion post working out with elevated blood pressure reading in our clinic with no history of CAD, HTN, TIA or DM.  EMS was called for further evaluation. Husband was called and arrived to the clinic to accompany patient.  Patient was [facility] to [facility] ER via EMS.   Recommend flu shot in the fall.  Assessment and Plan              No follow-ups on file.  No orders of the defined types were placed in this encounter.  Lab Orders  No laboratory test(s) ordered today    Diagnostics: Spirometry:  Tracings reviewed. Her effort: {Blank single:19197::Good reproducible efforts.,It was hard to get consistent efforts and there is a question as to whether this reflects a maximal maneuver.,Poor effort, data can not be interpreted.} FVC: ***L FEV1: ***L, ***% predicted FEV1/FVC ratio: ***% Interpretation: {Blank single:19197::Spirometry consistent with mild obstructive  disease,Spirometry consistent with moderate obstructive disease,Spirometry consistent with severe obstructive disease,Spirometry consistent with possible restrictive disease,Spirometry consistent with mixed obstructive and restrictive disease,Spirometry uninterpretable due to technique,Spirometry consistent with normal pattern,No overt abnormalities noted given today's  efforts}.  Please see scanned spirometry results for details.  Skin Testing: {Blank single:19197::Select foods,Environmental allergy  panel,Environmental allergy  panel and select foods,Food allergy  panel,None,Deferred due to recent antihistamines use}. *** Results discussed with patient/family.   Medication List:  Current Outpatient Medications  Medication Sig Dispense Refill   albuterol  (VENTOLIN  HFA) 108 (90 Base) MCG/ACT inhaler Inhale 2 puffs into the lungs every 4 (four) hours as needed for wheezing or shortness of breath (coughing fits). 18 g 1   ALPRAZolam  (XANAX ) 0.5 MG tablet TAKE 1/2 TO 1 (ONE-HALF TO ONE) TABLET BY MOUTH TWICE DAILY AS NEEDED FOR ANXIETY 60 tablet 5   amLODipine  (NORVASC ) 2.5 MG tablet Take 1 tablet by mouth once daily 90 tablet 1   aspirin  EC 81 MG tablet Take 1 tablet (81 mg total) by mouth daily. Swallow whole. 30 tablet 12   azelastine  (ASTELIN ) 0.1 % nasal spray Place 1-2 sprays in each nostril one to two times a day as needed for runny nose/drainage down throat (Patient taking differently: Place 1 spray into both nostrils daily as needed. Place 1-2 sprays in each nostril one to two times a day as needed for runny nose/drainage down throat) 30 mL 5   budesonide -formoterol  (BREYNA ) 80-4.5 MCG/ACT inhaler Inhale 2 puffs into the lungs in the morning and at bedtime. Rinse mouth after each use. 1 each 5   Calcium  Carb-Cholecalciferol (CALCIUM  + VITAMIN D3 PO) Take 1 tablet by mouth at bedtime.     ELDERBERRY PO Take 1 capsule by mouth 2 (two) times daily.     ketoconazole (NIZORAL) 2 % cream Apply 1 application topically 2 (two) times daily as needed (rash).     rosuvastatin  (CRESTOR ) 10 MG tablet Take 1 tablet (10 mg total) by mouth daily. 90 tablet 3   zolpidem  (AMBIEN ) 10 MG tablet TAKE 1 TABLET BY MOUTH EVERY DAY AT BEDTIME AS NEEDED FOR SLEEP 90 tablet 1   Current Facility-Administered Medications  Medication Dose Route Frequency Provider Last  Rate Last Admin   [START ON 02/13/2025] denosumab  (PROLIA ) injection 60 mg  60 mg Subcutaneous Once Gherghe, Cristina, MD       Allergies: Allergies[1] I reviewed her past medical history, social history, family history, and environmental history and no significant changes have been reported from her previous visit.  Review of Systems  Constitutional:  Negative for appetite change, chills, fever and unexpected weight change.  HENT:  Negative for congestion, postnasal drip and rhinorrhea.   Eyes:  Negative for itching.  Respiratory:  Positive for cough. Negative for chest tightness, shortness of breath and wheezing.   Cardiovascular:  Negative for chest pain.  Gastrointestinal:  Negative for abdominal pain.  Genitourinary:  Negative for difficulty urinating.  Skin:  Negative for rash.  Allergic/Immunologic: Positive for environmental allergies. Negative for food allergies.  Neurological:  Negative for headaches.    Objective: There were no vitals taken for this visit. There is no height or weight on file to calculate BMI. Physical Exam Vitals and nursing note reviewed.  Constitutional:      Appearance: Normal appearance. She is well-developed.  HENT:     Head: Normocephalic and atraumatic.     Right Ear: Tympanic membrane and external ear normal.     Left Ear: Tympanic membrane and external ear normal.  Nose: Nose normal.     Mouth/Throat:     Mouth: Mucous membranes are moist.     Pharynx: Oropharynx is clear.  Eyes:     Conjunctiva/sclera: Conjunctivae normal.  Cardiovascular:     Rate and Rhythm: Normal rate and regular rhythm.     Heart sounds: Normal heart sounds. No murmur heard.    No friction rub. No gallop.  Pulmonary:     Effort: Pulmonary effort is normal.     Breath sounds: Normal breath sounds. No wheezing or rales.  Musculoskeletal:     Cervical back: Neck supple.  Skin:    General: Skin is warm.     Findings: No rash.  Neurological:     Mental Status:  She is alert and oriented to person, place, and time.  Psychiatric:        Mood and Affect: Mood normal.        Behavior: Behavior normal.    Previous notes and tests were reviewed. The plan was reviewed with the patient/family, and all questions/concerned were addressed.  It was my pleasure to see Annica today and participate in her care. Please feel free to contact me with any questions or concerns.  Sincerely,  Orlan Cramp, DO Allergy  & Immunology  [facility]  [place] office: [phone] [facility] office: [phone]    [1] No Known Allergies  "

## 2024-08-26 ENCOUNTER — Ambulatory Visit: Admitting: Allergy

## 2024-08-26 ENCOUNTER — Encounter: Payer: Self-pay | Admitting: Allergy

## 2024-08-26 ENCOUNTER — Other Ambulatory Visit: Payer: Self-pay

## 2024-08-26 VITALS — BP 130/82 | HR 78 | Temp 97.7°F | Resp 18

## 2024-08-26 DIAGNOSIS — J301 Allergic rhinitis due to pollen: Secondary | ICD-10-CM | POA: Diagnosis not present

## 2024-08-26 DIAGNOSIS — J4530 Mild persistent asthma, uncomplicated: Secondary | ICD-10-CM

## 2024-08-26 NOTE — Patient Instructions (Addendum)
 Mild persistent asthma Today's breathing test was normal.  Daily controller medication(s): Breyna  80mcg 2 puffs once a day with spacer and rinse mouth afterwards. During respiratory infections/flares:  Start Breyna  80mcg 2 puffs twice a day for 1-2 weeks until your breathing symptoms return to baseline.  May use albuterol  rescue inhaler 2 puffs every 4 to 6 hours as needed for shortness of breath, chest tightness, coughing, and wheezing. May use albuterol  rescue inhaler 2 puffs 5 to 15 minutes prior to strenuous physical activities. Monitor frequency of use - if you need to use it more than twice per week on a consistent basis let us  know.  Breathing control goals:  Full participation in all desired activities (may need albuterol  before activity) Albuterol  use two times or less a week on average (not counting use with activity) Cough interfering with sleep two times or less a month Oral steroids no more than once a year No hospitalizations    Allergic rhinitis 2020 skin testing borderline positive to tree pollen.  Continue environmental control measures.  May use azelastine  nasal spray 1-2 sprays per nostril 1-2 times a day as needed for runny nose. May use over the counter antihistamines such as Zyrtec (cetirizine), Claritin (loratadine), Allegra (fexofenadine), or Xyzal (levocetirizine) daily as needed. May take twice a day during flares.   Follow up in 6 months or sooner if needed.  Reducing Pollen Exposure Pollen seasons: trees (spring), grass (summer) and ragweed/weeds (fall). Keep windows closed in your home and car to lower pollen exposure.  Install air conditioning in the bedroom and throughout the house if possible.  Avoid going out in dry windy days - especially early morning. Pollen counts are highest between 5 - 10 AM and on dry, hot and windy days.  Save outside activities for late afternoon or after a heavy rain, when pollen levels are lower.  Avoid mowing of grass if you  have grass pollen allergy . Be aware that pollen can also be transported indoors on people and pets.  Dry your clothes in an automatic dryer rather than hanging them outside where they might collect pollen.  Rinse hair and eyes before bedtime.

## 2024-09-03 ENCOUNTER — Telehealth: Payer: Self-pay | Admitting: Neurology

## 2024-09-03 NOTE — Telephone Encounter (Signed)
 LVM and sent mychart informing pt reschedule is needed.

## 2024-09-06 ENCOUNTER — Other Ambulatory Visit: Payer: Self-pay | Admitting: Internal Medicine

## 2024-09-13 ENCOUNTER — Ambulatory Visit: Admitting: Cardiovascular Disease

## 2024-09-30 ENCOUNTER — Ambulatory Visit: Admitting: Neurology

## 2024-10-09 ENCOUNTER — Ambulatory Visit: Admitting: Adult Health

## 2024-11-20 ENCOUNTER — Ambulatory Visit: Admitting: Internal Medicine

## 2024-11-26 ENCOUNTER — Other Ambulatory Visit: Payer: Self-pay

## 2024-11-28 ENCOUNTER — Ambulatory Visit: Payer: Self-pay | Admitting: Internal Medicine

## 2025-02-21 ENCOUNTER — Ambulatory Visit

## 2025-03-05 ENCOUNTER — Ambulatory Visit: Admitting: Allergy
